# Patient Record
Sex: Female | Born: 1960
Health system: Southern US, Community
[De-identification: ages and names within clinical notes are randomized; demographics above are authoritative.]

## PROBLEM LIST (undated history)

## (undated) DIAGNOSIS — G43909 Migraine, unspecified, not intractable, without status migrainosus: Secondary | ICD-10-CM

## (undated) DIAGNOSIS — Z87891 Personal history of nicotine dependence: Secondary | ICD-10-CM

## (undated) DIAGNOSIS — Z87440 Personal history of urinary (tract) infections: Secondary | ICD-10-CM

## (undated) DIAGNOSIS — R079 Chest pain, unspecified: Secondary | ICD-10-CM

## (undated) DIAGNOSIS — I10 Essential (primary) hypertension: Secondary | ICD-10-CM

## (undated) DIAGNOSIS — E119 Type 2 diabetes mellitus without complications: Secondary | ICD-10-CM

## (undated) DIAGNOSIS — L4050 Arthropathic psoriasis, unspecified: Secondary | ICD-10-CM

## (undated) DIAGNOSIS — G4700 Insomnia, unspecified: Secondary | ICD-10-CM

## (undated) DIAGNOSIS — G43019 Migraine without aura, intractable, without status migrainosus: Secondary | ICD-10-CM

## (undated) DIAGNOSIS — E785 Hyperlipidemia, unspecified: Secondary | ICD-10-CM

## (undated) DIAGNOSIS — Z8673 Personal history of transient ischemic attack (TIA), and cerebral infarction without residual deficits: Secondary | ICD-10-CM

## (undated) DIAGNOSIS — F32A Depression, unspecified: Secondary | ICD-10-CM

## (undated) DIAGNOSIS — G4730 Sleep apnea, unspecified: Secondary | ICD-10-CM

## (undated) DIAGNOSIS — IMO0002 Reserved for concepts with insufficient information to code with codable children: Secondary | ICD-10-CM

## (undated) DIAGNOSIS — I5032 Chronic diastolic (congestive) heart failure: Secondary | ICD-10-CM

## (undated) DIAGNOSIS — Z8249 Family history of ischemic heart disease and other diseases of the circulatory system: Secondary | ICD-10-CM

## (undated) DIAGNOSIS — M7540 Impingement syndrome of unspecified shoulder: Secondary | ICD-10-CM

## (undated) DIAGNOSIS — F419 Anxiety disorder, unspecified: Secondary | ICD-10-CM

## (undated) DIAGNOSIS — K219 Gastro-esophageal reflux disease without esophagitis: Secondary | ICD-10-CM

## (undated) DIAGNOSIS — D649 Anemia, unspecified: Secondary | ICD-10-CM

## (undated) DIAGNOSIS — F329 Major depressive disorder, single episode, unspecified: Secondary | ICD-10-CM

## (undated) DIAGNOSIS — M75110 Incomplete rotator cuff tear or rupture of unspecified shoulder, not specified as traumatic: Secondary | ICD-10-CM

## (undated) DIAGNOSIS — R51 Headache: Secondary | ICD-10-CM

## (undated) DIAGNOSIS — M5030 Other cervical disc degeneration, unspecified cervical region: Secondary | ICD-10-CM

## (undated) DIAGNOSIS — M779 Enthesopathy, unspecified: Secondary | ICD-10-CM

## (undated) HISTORY — DX: Anemia, unspecified: D64.9

## (undated) HISTORY — DX: Enthesopathy, unspecified: M77.9

## (undated) HISTORY — DX: Anxiety disorder, unspecified: F41.9

## (undated) HISTORY — PX: KNEE ARTHROPLASTY: SHX992

## (undated) HISTORY — DX: Gastro-esophageal reflux disease without esophagitis: K21.9

## (undated) HISTORY — DX: Migraine without aura, intractable, without status migrainosus: G43.019

## (undated) HISTORY — DX: Incomplete rotator cuff tear or rupture of unspecified shoulder, not specified as traumatic: M75.110

## (undated) HISTORY — DX: Family history of ischemic heart disease and other diseases of the circulatory system: Z82.49

## (undated) HISTORY — DX: Chronic diastolic (congestive) heart failure: I50.32

## (undated) HISTORY — DX: Personal history of nicotine dependence: Z87.891

## (undated) HISTORY — PX: OTHER SURGICAL HISTORY: SHX169

## (undated) HISTORY — PX: TOTAL SHOULDER ARTHROPLASTY: SHX126

## (undated) HISTORY — DX: Insomnia, unspecified: G47.00

## (undated) HISTORY — DX: Type 2 diabetes mellitus without complications: E11.9

## (undated) HISTORY — DX: Sleep apnea, unspecified: G47.30

## (undated) HISTORY — DX: Hyperlipidemia, unspecified: E78.5

## (undated) HISTORY — DX: Arthropathic psoriasis, unspecified: L40.50

## (undated) HISTORY — PX: DILATION AND CURETTAGE OF UTERUS: SHX78

## (undated) HISTORY — DX: Essential (primary) hypertension: I10

## (undated) HISTORY — DX: Morbid (severe) obesity due to excess calories: E66.01

## (undated) HISTORY — DX: Other cervical disc degeneration, unspecified cervical region: M50.30

## (undated) HISTORY — DX: Depression, unspecified: F32.A

## (undated) HISTORY — DX: Migraine, unspecified, not intractable, without status migrainosus: G43.909

## (undated) HISTORY — PX: BREAST BIOPSY: SHX20

---
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
## 2000-10-06 ENCOUNTER — Other Ambulatory Visit: Admission: RE | Admit: 2000-10-06 | Discharge: 2000-10-06 | Payer: Self-pay | Admitting: Gynecology

## 2000-10-06 ENCOUNTER — Other Ambulatory Visit: Admission: RE | Admit: 2000-10-06 | Discharge: 2000-10-06 | Payer: Self-pay | Admitting: Obstetrics and Gynecology

## 2001-04-23 ENCOUNTER — Encounter: Admission: RE | Admit: 2001-04-23 | Discharge: 2001-04-23 | Payer: Self-pay | Admitting: Family Medicine

## 2001-04-23 ENCOUNTER — Encounter: Payer: Self-pay | Admitting: Family Medicine

## 2002-02-13 ENCOUNTER — Other Ambulatory Visit: Admission: RE | Admit: 2002-02-13 | Discharge: 2002-02-13 | Payer: Self-pay | Admitting: Obstetrics and Gynecology

## 2002-03-28 ENCOUNTER — Inpatient Hospital Stay (HOSPITAL_COMMUNITY): Admission: RE | Admit: 2002-03-28 | Discharge: 2002-03-31 | Payer: Self-pay | Admitting: Obstetrics and Gynecology

## 2002-03-28 ENCOUNTER — Encounter (INDEPENDENT_AMBULATORY_CARE_PROVIDER_SITE_OTHER): Payer: Self-pay | Admitting: *Deleted

## 2002-03-28 HISTORY — PX: UMBILICAL HERNIA REPAIR: SHX196

## 2002-03-28 HISTORY — PX: ABDOMINAL HYSTERECTOMY: SHX81

## 2002-10-22 ENCOUNTER — Encounter: Payer: Self-pay | Admitting: Family Medicine

## 2002-10-22 ENCOUNTER — Encounter: Admission: RE | Admit: 2002-10-22 | Discharge: 2002-10-22 | Payer: Self-pay | Admitting: Family Medicine

## 2002-11-07 ENCOUNTER — Other Ambulatory Visit: Admission: RE | Admit: 2002-11-07 | Discharge: 2002-11-07 | Payer: Self-pay | Admitting: Family Medicine

## 2002-11-13 ENCOUNTER — Encounter: Payer: Self-pay | Admitting: Family Medicine

## 2002-11-13 ENCOUNTER — Encounter: Admission: RE | Admit: 2002-11-13 | Discharge: 2002-11-13 | Payer: Self-pay | Admitting: Family Medicine

## 2003-05-29 ENCOUNTER — Encounter: Admission: RE | Admit: 2003-05-29 | Discharge: 2003-05-29 | Payer: Self-pay | Admitting: Family Medicine

## 2003-05-29 ENCOUNTER — Encounter: Payer: Self-pay | Admitting: Family Medicine

## 2003-11-13 ENCOUNTER — Other Ambulatory Visit: Admission: RE | Admit: 2003-11-13 | Discharge: 2003-11-13 | Payer: Self-pay | Admitting: Family Medicine

## 2004-02-23 ENCOUNTER — Encounter: Admission: RE | Admit: 2004-02-23 | Discharge: 2004-02-23 | Payer: Self-pay | Admitting: Family Medicine

## 2007-04-24 ENCOUNTER — Encounter: Admission: RE | Admit: 2007-04-24 | Discharge: 2007-04-24 | Payer: Self-pay | Admitting: Family Medicine

## 2008-09-19 ENCOUNTER — Ambulatory Visit: Payer: Self-pay | Admitting: Family Medicine

## 2008-09-26 ENCOUNTER — Ambulatory Visit: Payer: Self-pay | Admitting: Family Medicine

## 2008-10-13 ENCOUNTER — Inpatient Hospital Stay (HOSPITAL_COMMUNITY): Admission: RE | Admit: 2008-10-13 | Discharge: 2008-10-15 | Payer: Self-pay | Admitting: Orthopedic Surgery

## 2008-10-13 HISTORY — PX: TOTAL KNEE ARTHROPLASTY: SHX125

## 2008-12-17 ENCOUNTER — Ambulatory Visit: Payer: Self-pay | Admitting: Family Medicine

## 2009-02-03 ENCOUNTER — Ambulatory Visit: Payer: Self-pay | Admitting: Family Medicine

## 2009-03-23 ENCOUNTER — Ambulatory Visit: Payer: Self-pay | Admitting: Family Medicine

## 2009-04-16 ENCOUNTER — Ambulatory Visit: Payer: Self-pay | Admitting: Family Medicine

## 2009-06-18 ENCOUNTER — Ambulatory Visit: Payer: Self-pay | Admitting: Family Medicine

## 2009-09-01 ENCOUNTER — Ambulatory Visit: Payer: Self-pay | Admitting: Family Medicine

## 2009-09-07 ENCOUNTER — Inpatient Hospital Stay (HOSPITAL_COMMUNITY): Admission: RE | Admit: 2009-09-07 | Discharge: 2009-09-10 | Payer: Self-pay | Admitting: Orthopedic Surgery

## 2009-09-07 HISTORY — PX: TOTAL KNEE ARTHROPLASTY: SHX125

## 2009-09-13 ENCOUNTER — Encounter (INDEPENDENT_AMBULATORY_CARE_PROVIDER_SITE_OTHER): Payer: Self-pay | Admitting: Emergency Medicine

## 2009-09-13 ENCOUNTER — Emergency Department (HOSPITAL_COMMUNITY): Admission: EM | Admit: 2009-09-13 | Discharge: 2009-09-13 | Payer: Self-pay | Admitting: Emergency Medicine

## 2009-09-13 ENCOUNTER — Ambulatory Visit: Payer: Self-pay | Admitting: Vascular Surgery

## 2009-10-06 ENCOUNTER — Ambulatory Visit: Payer: Self-pay | Admitting: Family Medicine

## 2009-11-04 ENCOUNTER — Ambulatory Visit: Payer: Self-pay | Admitting: Family Medicine

## 2009-12-21 ENCOUNTER — Ambulatory Visit: Payer: Self-pay | Admitting: Physician Assistant

## 2010-02-18 ENCOUNTER — Ambulatory Visit (HOSPITAL_COMMUNITY): Admission: RE | Admit: 2010-02-18 | Discharge: 2010-02-19 | Payer: Self-pay | Admitting: Neurological Surgery

## 2010-02-18 HISTORY — PX: ANTERIOR CERVICAL DECOMP/DISCECTOMY FUSION: SHX1161

## 2010-03-22 ENCOUNTER — Encounter: Admission: RE | Admit: 2010-03-22 | Discharge: 2010-03-22 | Payer: Self-pay | Admitting: Neurological Surgery

## 2010-04-07 ENCOUNTER — Ambulatory Visit: Payer: Self-pay | Admitting: Physician Assistant

## 2010-04-08 ENCOUNTER — Encounter: Admission: RE | Admit: 2010-04-08 | Discharge: 2010-04-08 | Payer: Self-pay | Admitting: Family Medicine

## 2010-06-16 ENCOUNTER — Ambulatory Visit: Payer: Self-pay | Admitting: Physician Assistant

## 2010-06-22 ENCOUNTER — Ambulatory Visit: Payer: Self-pay | Admitting: Physician Assistant

## 2010-06-28 ENCOUNTER — Encounter: Admission: RE | Admit: 2010-06-28 | Discharge: 2010-06-28 | Payer: Self-pay | Admitting: Neurological Surgery

## 2010-08-04 ENCOUNTER — Ambulatory Visit: Payer: Self-pay | Admitting: Physician Assistant

## 2010-11-28 LAB — CBC
HCT: 27.3 % — ABNORMAL LOW (ref 36.0–46.0)
Hemoglobin: 9.1 g/dL — ABNORMAL LOW (ref 12.0–15.0)
MCHC: 33.4 g/dL (ref 30.0–36.0)
MCV: 83.6 fL (ref 78.0–100.0)
Platelets: 356 10*3/uL (ref 150–400)
RBC: 3.26 MIL/uL — ABNORMAL LOW (ref 3.87–5.11)
RDW: 15.6 % — ABNORMAL HIGH (ref 11.5–15.5)
WBC: 12.1 10*3/uL — ABNORMAL HIGH (ref 4.0–10.5)

## 2010-11-28 LAB — BASIC METABOLIC PANEL
BUN: 8 mg/dL (ref 6–23)
CO2: 27 mEq/L (ref 19–32)
Calcium: 9.1 mg/dL (ref 8.4–10.5)
Chloride: 100 mEq/L (ref 96–112)
Creatinine, Ser: 0.7 mg/dL (ref 0.4–1.2)
GFR calc Af Amer: >60 mL/min (ref >60)
GFR calc non Af Amer: >60 mL/min (ref >60)
Glucose, Bld: 137 mg/dL — ABNORMAL HIGH (ref 70–99)
Potassium: 3.8 mEq/L (ref 3.5–5.1)
Sodium: 135 mEq/L (ref 135–145)

## 2010-11-28 LAB — DIFFERENTIAL
Basophils Absolute: 0.1 10*3/uL (ref 0.0–0.1)
Basophils Relative: 1 % (ref 0–1)
Eosinophils Absolute: 0.2 10*3/uL (ref 0.0–0.7)
Eosinophils Relative: 2 % (ref 0–5)
Lymphocytes Relative: 12 % (ref 12–46)
Lymphs Abs: 1.4 10*3/uL (ref 0.7–4.0)
Monocytes Absolute: 0.8 10*3/uL (ref 0.1–1.0)
Monocytes Relative: 7 % (ref 3–12)
Neutro Abs: 9.5 10*3/uL — ABNORMAL HIGH (ref 1.7–7.7)
Neutrophils Relative %: 79 % — ABNORMAL HIGH (ref 43–77)

## 2010-11-28 LAB — SEDIMENTATION RATE: Sed Rate: 112 mm/hr — ABNORMAL HIGH (ref 0–22)

## 2010-11-28 LAB — PROTIME-INR
INR: 1.29 (ref 0.00–1.49)
Prothrombin Time: 16 seconds — ABNORMAL HIGH (ref 11.6–15.2)

## 2010-11-29 ENCOUNTER — Other Ambulatory Visit: Payer: Self-pay | Admitting: Neurological Surgery

## 2010-11-29 ENCOUNTER — Ambulatory Visit
Admission: RE | Admit: 2010-11-29 | Discharge: 2010-11-29 | Disposition: A | Discharge status: Home / Self Care | Payer: BC Managed Care – PPO | Source: Ambulatory Visit | Attending: Neurological Surgery | Admitting: Neurological Surgery

## 2010-11-29 ENCOUNTER — Ambulatory Visit (INDEPENDENT_AMBULATORY_CARE_PROVIDER_SITE_OTHER): Payer: BC Managed Care – PPO | Admitting: Family Medicine

## 2010-11-29 DIAGNOSIS — M5412 Radiculopathy, cervical region: Secondary | ICD-10-CM

## 2010-11-29 DIAGNOSIS — M5020 Other cervical disc displacement, unspecified cervical region: Secondary | ICD-10-CM

## 2010-11-29 DIAGNOSIS — F430 Acute stress reaction: Secondary | ICD-10-CM

## 2010-11-29 DIAGNOSIS — Z79899 Other long term (current) drug therapy: Secondary | ICD-10-CM

## 2010-11-29 LAB — BASIC METABOLIC PANEL
BUN: 12 mg/dL (ref 6–23)
CO2: 27 mEq/L (ref 19–32)
Calcium: 9.7 mg/dL (ref 8.4–10.5)
Chloride: 100 mEq/L (ref 96–112)
Creatinine, Ser: 0.78 mg/dL (ref 0.4–1.2)
GFR calc Af Amer: >60 mL/min (ref >60)
GFR calc non Af Amer: >60 mL/min (ref >60)
Glucose, Bld: 85 mg/dL (ref 70–99)
Potassium: 4.2 mEq/L (ref 3.5–5.1)
Sodium: 134 mEq/L — ABNORMAL LOW (ref 135–145)

## 2010-11-29 LAB — CBC
HCT: 36.3 % (ref 36.0–46.0)
Hemoglobin: 12.2 g/dL (ref 12.0–15.0)
MCHC: 33.7 g/dL (ref 30.0–36.0)
MCV: 83.1 fL (ref 78.0–100.0)
Platelets: 304 10*3/uL (ref 150–400)
RBC: 4.36 MIL/uL (ref 3.87–5.11)
RDW: 15.8 % — ABNORMAL HIGH (ref 11.5–15.5)
WBC: 10 10*3/uL (ref 4.0–10.5)

## 2010-11-29 LAB — SURGICAL PCR SCREEN
MRSA, PCR: NEGATIVE
Staphylococcus aureus: NEGATIVE

## 2010-11-29 LAB — APTT: aPTT: 28 seconds (ref 24–37)

## 2010-11-29 LAB — PROTIME-INR
INR: 0.96 (ref 0.00–1.49)
Prothrombin Time: 12.7 seconds (ref 11.6–15.2)

## 2010-12-13 LAB — URINE MICROSCOPIC-ADD ON

## 2010-12-13 LAB — CBC
HCT: 26.8 % — ABNORMAL LOW (ref 36.0–46.0)
HCT: 28.2 % — ABNORMAL LOW (ref 36.0–46.0)
HCT: 29.5 % — ABNORMAL LOW (ref 36.0–46.0)
HCT: 37.5 % (ref 36.0–46.0)
Hemoglobin: 12.5 g/dL (ref 12.0–15.0)
Hemoglobin: 8.9 g/dL — ABNORMAL LOW (ref 12.0–15.0)
Hemoglobin: 9.5 g/dL — ABNORMAL LOW (ref 12.0–15.0)
Hemoglobin: 9.9 g/dL — ABNORMAL LOW (ref 12.0–15.0)
MCHC: 33.1 g/dL (ref 30.0–36.0)
MCHC: 33.3 g/dL (ref 30.0–36.0)
MCHC: 33.5 g/dL (ref 30.0–36.0)
MCHC: 33.7 g/dL (ref 30.0–36.0)
MCV: 84 fL (ref 78.0–100.0)
MCV: 84.6 fL (ref 78.0–100.0)
MCV: 84.8 fL (ref 78.0–100.0)
MCV: 84.9 fL (ref 78.0–100.0)
Platelets: 204 10*3/uL (ref 150–400)
Platelets: 208 10*3/uL (ref 150–400)
Platelets: 209 10*3/uL (ref 150–400)
Platelets: 290 10*3/uL (ref 150–400)
RBC: 3.16 MIL/uL — ABNORMAL LOW (ref 3.87–5.11)
RBC: 3.36 MIL/uL — ABNORMAL LOW (ref 3.87–5.11)
RBC: 3.48 MIL/uL — ABNORMAL LOW (ref 3.87–5.11)
RBC: 4.43 MIL/uL (ref 3.87–5.11)
RDW: 15.2 % (ref 11.5–15.5)
RDW: 15.5 % (ref 11.5–15.5)
RDW: 15.6 % — ABNORMAL HIGH (ref 11.5–15.5)
RDW: 15.6 % — ABNORMAL HIGH (ref 11.5–15.5)
WBC: 10.1 10*3/uL (ref 4.0–10.5)
WBC: 12.4 10*3/uL — ABNORMAL HIGH (ref 4.0–10.5)
WBC: 13.9 10*3/uL — ABNORMAL HIGH (ref 4.0–10.5)
WBC: 9.9 10*3/uL (ref 4.0–10.5)

## 2010-12-13 LAB — COMPREHENSIVE METABOLIC PANEL
ALT: 17 U/L (ref 0–35)
AST: 19 U/L (ref 0–37)
Albumin: 3.9 g/dL (ref 3.5–5.2)
Alkaline Phosphatase: 79 U/L (ref 39–117)
BUN: 9 mg/dL (ref 6–23)
CO2: 30 mEq/L (ref 19–32)
Calcium: 9.6 mg/dL (ref 8.4–10.5)
Chloride: 100 mEq/L (ref 96–112)
Creatinine, Ser: 0.75 mg/dL (ref 0.4–1.2)
GFR calc Af Amer: >60 mL/min (ref >60)
GFR calc non Af Amer: >60 mL/min (ref >60)
Glucose, Bld: 84 mg/dL (ref 70–99)
Potassium: 4 mEq/L (ref 3.5–5.1)
Sodium: 138 mEq/L (ref 135–145)
Total Bilirubin: 0.4 mg/dL (ref 0.3–1.2)
Total Protein: 7.2 g/dL (ref 6.0–8.3)

## 2010-12-13 LAB — BASIC METABOLIC PANEL
BUN: 4 mg/dL — ABNORMAL LOW (ref 6–23)
BUN: 5 mg/dL — ABNORMAL LOW (ref 6–23)
BUN: 7 mg/dL (ref 6–23)
CO2: 27 mEq/L (ref 19–32)
CO2: 28 mEq/L (ref 19–32)
CO2: 30 mEq/L (ref 19–32)
Calcium: 8.2 mg/dL — ABNORMAL LOW (ref 8.4–10.5)
Calcium: 8.2 mg/dL — ABNORMAL LOW (ref 8.4–10.5)
Calcium: 8.5 mg/dL (ref 8.4–10.5)
Chloride: 100 mEq/L (ref 96–112)
Chloride: 99 mEq/L (ref 96–112)
Chloride: 99 mEq/L (ref 96–112)
Creatinine, Ser: 0.61 mg/dL (ref 0.4–1.2)
Creatinine, Ser: 0.69 mg/dL (ref 0.4–1.2)
Creatinine, Ser: 0.74 mg/dL (ref 0.4–1.2)
GFR calc Af Amer: >60 mL/min (ref >60)
GFR calc Af Amer: >60 mL/min (ref >60)
GFR calc Af Amer: >60 mL/min (ref >60)
GFR calc non Af Amer: >60 mL/min (ref >60)
GFR calc non Af Amer: >60 mL/min (ref >60)
GFR calc non Af Amer: >60 mL/min (ref >60)
Glucose, Bld: 115 mg/dL — ABNORMAL HIGH (ref 70–99)
Glucose, Bld: 129 mg/dL — ABNORMAL HIGH (ref 70–99)
Glucose, Bld: 97 mg/dL (ref 70–99)
Potassium: 3.4 mEq/L — ABNORMAL LOW (ref 3.5–5.1)
Potassium: 3.8 mEq/L (ref 3.5–5.1)
Potassium: 4 mEq/L (ref 3.5–5.1)
Sodium: 133 mEq/L — ABNORMAL LOW (ref 135–145)
Sodium: 135 mEq/L (ref 135–145)
Sodium: 135 mEq/L (ref 135–145)

## 2010-12-13 LAB — PROTIME-INR
INR: 0.94 (ref 0.00–1.49)
INR: 1.2 (ref 0.00–1.49)
INR: 1.5 — ABNORMAL HIGH (ref 0.00–1.49)
INR: 1.51 — ABNORMAL HIGH (ref 0.00–1.49)
Prothrombin Time: 12.5 seconds (ref 11.6–15.2)
Prothrombin Time: 15.1 seconds (ref 11.6–15.2)
Prothrombin Time: 18 seconds — ABNORMAL HIGH (ref 11.6–15.2)
Prothrombin Time: 18.1 seconds — ABNORMAL HIGH (ref 11.6–15.2)

## 2010-12-13 LAB — URINE CULTURE
Colony Count: 35000
Colony Count: NO GROWTH
Culture: NO GROWTH

## 2010-12-13 LAB — URINALYSIS, MICROSCOPIC ONLY
Bilirubin Urine: NEGATIVE
Glucose, UA: NEGATIVE mg/dL
Hgb urine dipstick: NEGATIVE
Ketones, ur: NEGATIVE mg/dL
Leukocytes, UA: NEGATIVE
Nitrite: NEGATIVE
Protein, ur: NEGATIVE mg/dL
Specific Gravity, Urine: 1.01 (ref 1.005–1.030)
Urobilinogen, UA: 0.2 mg/dL (ref 0.0–1.0)
pH: 6 (ref 5.0–8.0)

## 2010-12-13 LAB — DIFFERENTIAL
Basophils Absolute: 0.1 10*3/uL (ref 0.0–0.1)
Basophils Relative: 1 % (ref 0–1)
Eosinophils Absolute: 0.3 10*3/uL (ref 0.0–0.7)
Eosinophils Relative: 3 % (ref 0–5)
Lymphocytes Relative: 18 % (ref 12–46)
Lymphs Abs: 1.8 10*3/uL (ref 0.7–4.0)
Monocytes Absolute: 0.6 10*3/uL (ref 0.1–1.0)
Monocytes Relative: 6 % (ref 3–12)
Neutro Abs: 7.2 10*3/uL (ref 1.7–7.7)
Neutrophils Relative %: 73 % (ref 43–77)

## 2010-12-13 LAB — URINALYSIS, ROUTINE W REFLEX MICROSCOPIC
Bilirubin Urine: NEGATIVE
Glucose, UA: NEGATIVE mg/dL
Hgb urine dipstick: NEGATIVE
Ketones, ur: NEGATIVE mg/dL
Nitrite: NEGATIVE
Protein, ur: NEGATIVE mg/dL
Specific Gravity, Urine: 1.017 (ref 1.005–1.030)
Urobilinogen, UA: 0.2 mg/dL (ref 0.0–1.0)
pH: 5.5 (ref 5.0–8.0)

## 2010-12-13 LAB — APTT: aPTT: 29 seconds (ref 24–37)

## 2010-12-13 LAB — TYPE AND SCREEN
ABO/RH(D): B POS
Antibody Screen: NEGATIVE

## 2010-12-14 ENCOUNTER — Ambulatory Visit (INDEPENDENT_AMBULATORY_CARE_PROVIDER_SITE_OTHER): Payer: BC Managed Care – PPO | Admitting: Family Medicine

## 2010-12-14 DIAGNOSIS — R55 Syncope and collapse: Secondary | ICD-10-CM

## 2010-12-27 LAB — DIFFERENTIAL
Basophils Absolute: 0 10*3/uL (ref 0.0–0.1)
Basophils Relative: 0 % (ref 0–1)
Eosinophils Absolute: 0.2 10*3/uL (ref 0.0–0.7)
Eosinophils Relative: 2 % (ref 0–5)
Lymphocytes Relative: 21 % (ref 12–46)
Lymphs Abs: 2.2 10*3/uL (ref 0.7–4.0)
Monocytes Absolute: 0.8 10*3/uL (ref 0.1–1.0)
Monocytes Relative: 8 % (ref 3–12)
Neutro Abs: 7 10*3/uL (ref 1.7–7.7)
Neutrophils Relative %: 69 % (ref 43–77)

## 2010-12-27 LAB — CBC
HCT: 39.9 % (ref 36.0–46.0)
Hemoglobin: 13.1 g/dL (ref 12.0–15.0)
MCHC: 32.8 g/dL (ref 30.0–36.0)
MCV: 84.2 fL (ref 78.0–100.0)
Platelets: 317 10*3/uL (ref 150–400)
RBC: 4.74 MIL/uL (ref 3.87–5.11)
RDW: 14.8 % (ref 11.5–15.5)
WBC: 10.2 10*3/uL (ref 4.0–10.5)

## 2010-12-27 LAB — COMPREHENSIVE METABOLIC PANEL
ALT: 18 U/L (ref 0–35)
AST: 18 U/L (ref 0–37)
Albumin: 3.7 g/dL (ref 3.5–5.2)
Alkaline Phosphatase: 75 U/L (ref 39–117)
BUN: 9 mg/dL (ref 6–23)
CO2: 29 mEq/L (ref 19–32)
Calcium: 10.5 mg/dL (ref 8.4–10.5)
Chloride: 99 mEq/L (ref 96–112)
Creatinine, Ser: 0.77 mg/dL (ref 0.4–1.2)
GFR calc Af Amer: >60 mL/min (ref >60)
GFR calc non Af Amer: >60 mL/min (ref >60)
Glucose, Bld: 98 mg/dL (ref 70–99)
Potassium: 4 mEq/L (ref 3.5–5.1)
Sodium: 139 mEq/L (ref 135–145)
Total Bilirubin: 0.4 mg/dL (ref 0.3–1.2)
Total Protein: 7 g/dL (ref 6.0–8.3)

## 2010-12-27 LAB — URINE CULTURE: Colony Count: >=100000

## 2010-12-27 LAB — PROTIME-INR
INR: 0.9 (ref 0.00–1.49)
Prothrombin Time: 12.6 seconds (ref 11.6–15.2)

## 2010-12-27 LAB — URINALYSIS, ROUTINE W REFLEX MICROSCOPIC
Bilirubin Urine: NEGATIVE
Glucose, UA: NEGATIVE mg/dL
Hgb urine dipstick: NEGATIVE
Ketones, ur: NEGATIVE mg/dL
Nitrite: NEGATIVE
Protein, ur: NEGATIVE mg/dL
Specific Gravity, Urine: 1.015 (ref 1.005–1.030)
Urobilinogen, UA: 0.2 mg/dL (ref 0.0–1.0)
pH: 6.5 (ref 5.0–8.0)

## 2010-12-27 LAB — URINE MICROSCOPIC-ADD ON

## 2010-12-27 LAB — APTT: aPTT: 32 seconds (ref 24–37)

## 2010-12-28 LAB — CBC
HCT: 27.9 % — ABNORMAL LOW (ref 36.0–46.0)
HCT: 29.7 % — ABNORMAL LOW (ref 36.0–46.0)
Hemoglobin: 9.2 g/dL — ABNORMAL LOW (ref 12.0–15.0)
Hemoglobin: 9.8 g/dL — ABNORMAL LOW (ref 12.0–15.0)
MCHC: 33 g/dL (ref 30.0–36.0)
MCHC: 33.1 g/dL (ref 30.0–36.0)
MCV: 83.7 fL (ref 78.0–100.0)
MCV: 84 fL (ref 78.0–100.0)
Platelets: 202 10*3/uL (ref 150–400)
Platelets: 212 10*3/uL (ref 150–400)
RBC: 3.32 MIL/uL — ABNORMAL LOW (ref 3.87–5.11)
RBC: 3.55 MIL/uL — ABNORMAL LOW (ref 3.87–5.11)
RDW: 14.6 % (ref 11.5–15.5)
RDW: 14.8 % (ref 11.5–15.5)
WBC: 10.8 10*3/uL — ABNORMAL HIGH (ref 4.0–10.5)
WBC: 12.7 10*3/uL — ABNORMAL HIGH (ref 4.0–10.5)

## 2010-12-28 LAB — BASIC METABOLIC PANEL
BUN: 6 mg/dL (ref 6–23)
BUN: 8 mg/dL (ref 6–23)
CO2: 29 mEq/L (ref 19–32)
CO2: 31 mEq/L (ref 19–32)
Calcium: 7.9 mg/dL — ABNORMAL LOW (ref 8.4–10.5)
Calcium: 8.2 mg/dL — ABNORMAL LOW (ref 8.4–10.5)
Chloride: 97 mEq/L (ref 96–112)
Chloride: 99 mEq/L (ref 96–112)
Creatinine, Ser: 0.68 mg/dL (ref 0.4–1.2)
Creatinine, Ser: 0.79 mg/dL (ref 0.4–1.2)
GFR calc Af Amer: >60 mL/min (ref >60)
GFR calc Af Amer: >60 mL/min (ref >60)
GFR calc non Af Amer: >60 mL/min (ref >60)
GFR calc non Af Amer: >60 mL/min (ref >60)
Glucose, Bld: 100 mg/dL — ABNORMAL HIGH (ref 70–99)
Glucose, Bld: 106 mg/dL — ABNORMAL HIGH (ref 70–99)
Potassium: 2.9 mEq/L — ABNORMAL LOW (ref 3.5–5.1)
Potassium: 3.3 mEq/L — ABNORMAL LOW (ref 3.5–5.1)
Sodium: 134 mEq/L — ABNORMAL LOW (ref 135–145)
Sodium: 135 mEq/L (ref 135–145)

## 2010-12-28 LAB — URINALYSIS, ROUTINE W REFLEX MICROSCOPIC
Bilirubin Urine: NEGATIVE
Glucose, UA: NEGATIVE mg/dL
Hgb urine dipstick: NEGATIVE
Ketones, ur: NEGATIVE mg/dL
Nitrite: NEGATIVE
Protein, ur: NEGATIVE mg/dL
Specific Gravity, Urine: 1.021 (ref 1.005–1.030)
Urobilinogen, UA: 0.2 mg/dL (ref 0.0–1.0)
pH: 6 (ref 5.0–8.0)

## 2010-12-28 LAB — PROTIME-INR
INR: 1.1 (ref 0.00–1.49)
INR: 1.4 (ref 0.00–1.49)
Prothrombin Time: 14.4 seconds (ref 11.6–15.2)
Prothrombin Time: 17.4 seconds — ABNORMAL HIGH (ref 11.6–15.2)

## 2010-12-28 LAB — TYPE AND SCREEN
ABO/RH(D): B POS
Antibody Screen: NEGATIVE

## 2010-12-28 LAB — ABO/RH: ABO/RH(D): B POS

## 2011-01-19 ENCOUNTER — Ambulatory Visit: Payer: BC Managed Care – PPO | Attending: Diagnostic Neuroimaging | Admitting: Physical Therapy

## 2011-01-19 DIAGNOSIS — R269 Unspecified abnormalities of gait and mobility: Secondary | ICD-10-CM | POA: Insufficient documentation

## 2011-01-19 DIAGNOSIS — IMO0001 Reserved for inherently not codable concepts without codable children: Secondary | ICD-10-CM | POA: Insufficient documentation

## 2011-01-19 DIAGNOSIS — M6281 Muscle weakness (generalized): Secondary | ICD-10-CM | POA: Insufficient documentation

## 2011-01-25 NOTE — Op Note (Signed)
[name], [name]             ACCOUNT NO.:  [id number]   MEDICAL RECORD NO.:  [id number]          PATIENT TYPE:  INP   LOCATION:  5008                         FACILITY:  [facility]   PHYSICIAN:  Elana Alm. Thurston Hole, [name] DATE OF BIRTH:  1961-09-06   DATE OF PROCEDURE:  10/13/2008  DATE OF DISCHARGE:                               OPERATIVE REPORT   PREOPERATIVE DIAGNOSIS:  Right knee degenerative joint disease.   POSTOPERATIVE DIAGNOSIS:  Right knee degenerative joint disease.   PROCEDURE:  Right total knee replacement using DePuy cemented total knee  system with #3 cemented femur, #3 cemented tibia, with 15-mm  polyethylene RP tibial spacer, and 32-mm polyethylene cemented patella.   SURGEON:  Elana Alm. Thurston Hole, MD   ASSISTANT:  Julien Girt, PA   ANESTHESIA:  General.   OPERATIVE TIME:  1 hour and 30 minutes.   COMPLICATIONS:  None.   DESCRIPTION OF PROCEDURE:  [name] was brought to the operating room  on October 13, 2008, after a femoral nerve block was placed in the  holding area by Anesthesia.  She was placed on the operative table in  supine position.  She received Ancef 2 g IV preoperatively for  prophylaxis.  Her right knee was examined under anesthesia.  Range of  motion from -5 to 120 degrees.  Moderate varus deformity, knee stable  ligamentous exam with normal patellar tracking.  The right leg was  prepped using sterile DuraPrep and draped using sterile technique.  She  had had a Foley catheter placed under sterile conditions.  Right leg was  then exsanguinated and a thigh tourniquet elevated to a 375 mm.  Initially through a 15-cm longitudinal incision based over the patella,  initial exposure was made.  The underlying subcutaneous tissues were  incised along with skin incision.  A median arthrotomy was performed  revealing excessive amount of normal-appearing joint fluid.  The  articular surfaces were inspected.  She had grade 4 changes medially,  grade 3  and 4 changes laterally, and grade 3 and 4 changes in her  patellofemoral joint.  Osteophytes removed from the femoral condyles and  tibial plateau.  Medial and lateral meniscal remnants were removed as  well as the anterior cruciate ligament.  An intramedullary drill was  then drilled up the femoral canal for placement of distal femoral  cutting jig, which was placed in the appropriate manner rotation and  distal 11-mm cut was then made.  Proximal femur was incised.  A #3 was  found to be the appropriate size.  A #3 cutting jig was placed in the  appropriate manner rotation and then these cuts were made.  The proximal  tibia was then exposed.  Tibial spines were removed with an oscillating  saw.  Intramedullary drill was drilled down the tibial canal for  placement of proximal tibial cutting jig, which was placed in the  appropriate manner rotation and a proximal 6 mm cut was made based off  the medial or lower side.  Spacer blocks were then placed in flexion and  extension.  A 15-mm block gave excellent balancing, excellent stability,  and excellent correction of her flexion and varus deformities.  At this  point, the proximal tibial surface was exposed.  A #3 tibial baseplate  trial was placed with an excellent fit and then the keel cut was made.  The PCL box cutter was then placed on the distal femur and these cuts  were made.  At this point, #3 femoral trial was placed and with the #3  tibial baseplate trial and a 15-mm polyethylene RP tibial spacer, knee  was reduced, taken through a range of motion from 0 to 125 degrees with  excellent stability and excellent correction of her flexion and varus  deformities and normal patellar tracking.  A resurfacing 8-mm cut was  then made on the patella and 3 locking holes placed for a 32-mm patellar  trial, which was then placed and again patellofemoral tracking was  evaluated and found to be normal.  At this point, it was felt that all  the  trial components were of excellent size, fit, and stability.  They  were then removed, and the knee was jet lavaged, irrigated with 3 liters  of saline.  The proximal tibia was then exposed.  A #3 tibial baseplate  with cement backing was hammered into position with an excellent fit  with excess cement being removed from around the edges.  A #3 femoral  component with cement backing was hammered in position also with an  excellent fit with excess cement being removed from around the edges.  The 15-mm polyethylene RP tibial spacer was placed on the tibial  baseplate, knee reduced, taken through a range of motion from 0 to 125  degrees with excellent stability and excellent correction of her flexion  and varus deformities.  The 32-mm polyethylene cement-backed patella was  then placed in this position and held there with a clamp.  After the  cement hardened, again patellofemoral tracking was evaluated and found  to be normal.  At this point, it was felt that all the components were  of excellent size, fit, and stability.  The wound was further irrigated  with saline.  The tourniquet was released, hemostasis obtained with  cautery.  The arthrotomy was then closed with #1 Ethilon suture over 2  medium Hemovac drains.  Subcutaneous tissues were closed with 0 and 2-0  Vicryl, subcuticular layer closed with 4-0 Monocryl, sterile dressings  and long-leg splint applied.  The patient was then awakened, extubated,  and taken to recovery room in stable condition.  Needle and sponge  counts were correct x2 at the end of the case.  Neurovascular status  normal, pulses 2+, and symmetric.      Robert A. Thurston Hole, [name]  Electronically Signed     RAW/MEDQ  D:  10/13/2008  T:  10/14/2008  Job:  [id number]

## 2011-01-26 ENCOUNTER — Ambulatory Visit: Payer: BC Managed Care – PPO | Admitting: Physical Therapy

## 2011-01-27 ENCOUNTER — Ambulatory Visit: Payer: BC Managed Care – PPO | Admitting: Physical Therapy

## 2011-01-28 NOTE — Discharge Summary (Signed)
   [name], [name] NO.:  [id number]   MEDICAL RECORD NO.:  [id number]                   PATIENT TYPE:   LOCATION:                                       FACILITY:   PHYSICIAN:  Miguel Aschoff, [name]                    DATE OF BIRTH:   DATE OF ADMISSION:  03/28/2002  DATE OF DISCHARGE:  03/31/2002                                 DISCHARGE SUMMARY   ADMISSION DIAGNOSES:  Irregular vaginal bleeding unresponsive to medical  therapy, periumbilical mass.   BRIEF HISTORY:  The patient is a 50 year old black female who has history of  persistent irregular vaginal bleeding that has been treated medically with  separate progestational therapy.  In addition, the patient also had a prior  D&C with benign pathology.  Because of continued irregular vaginal bleeding  she requested a definitive procedure be carried out to arrest this bleeding.  In addition, she was noted to have a mass in the umbilical region and wanted  this evaluated at the time of surgery.  Informed consents were obtained and  the patient was admitted to the hospital to undergo abdominal hysterectomy.   HOSPITAL COURSE:  Preoperative laboratories were obtained.  This included an  admission hemoglobin of 11.5, white count 9800.  PT and PTT were within  normal limits.  Chemistry profile was also within normal limits.  The  urinalysis was negative.  Under general anesthesia on March 28, 2002 a total  abdominal hysterectomy was carried out without difficulty.  After the  hysterectomy the mass at the umbilical region was evaluated and proved to be  an umbilical hernia and this was repaired without problems at the time of  closure of her abdominal incision.  The patient's postoperative course was  unremarkable.  She tolerated increasing ambulation and diet well and by July  20 was in satisfactory condition and could be discharged home.  Medications  for home included Tylox one q.3h. p.r.n. for pain,  Chromagen one daily,  Cipro 200 mg b.i.d., Peri-Colace as needed for BM.  She was sent home on a  regular diet.  Instructed to do no heavy lifting and place nothing in the  vagina.  She was to be seen back in four weeks for follow-up examination.  The pathology report on the hysterectomy specimen revealed the uterus  weighing 77 g with cervix showing squamous metaplasia, weakly proliferative  endometrium, leiomyomata in the myometrium.  The serosal surfaces were  unremarkable.                                               Miguel Aschoff, [name]    AR/MEDQ  D:  05/09/2002  T:  05/09/2002  Job:  [id number]

## 2011-01-28 NOTE — Op Note (Signed)
[facility]  Patient:    [name], [name] Visit Number: [id number] MRN: [id number]          Service Type: GYN Location: 9300 [id number] Attending Physician:  Miguel Aschoff Dictated by:   Miguel Aschoff, [name] Proc. Date: 03/28/02 Admit Date:  03/28/2002 Discharge Date: 03/31/2002                             Operative Report  PREOPERATIVE DIAGNOSES:       1. Menometrorrhagia.                               2. Periumbilical mass.  POSTOPERATIVE DIAGNOSES:      1. Menometrorrhagia.                               2. Periumbilical mass with small umbilical                                  hernia.  OPERATIONS:                   1. Total abdominal hysterectomy.                               2. Repair of umbilical hernia.  SURGEON:                      Miguel Aschoff, [name]  ANESTHESIA:                   General.  COMPLICATIONS:                None.  INDICATIONS:                  The patient is a 50 year old black female with a history of persistent irregular vaginal bleeding.  Multiple attempts were made to control the patients bleeding using medical therapy with cyclic Provera 10 days each month.  In addition, the patient has previously undergone a D&C for persistent irregular vaginal bleeding in 1995, which revealed an endometrial polyp and degenerating proliferative endometrium.  Because of the patients persistent bleeding and desire to undergo a definitive procedure to resolve this problem, she is being taken to the operating room at this time to undergo total abdominal hysterectomy.  In addition, she reports a mass in her periumbilical region, which she would like to have evaluated and treated if possible.  Informed consent has been obtained.  DESCRIPTION OF PROCEDURE:     The patient was taken to the operating room and placed in a supine position, and general anesthesia was administered without difficulty.  After this was done she was prepped and draped  in the usual sterile fashion.  A Foley catheter was inserted.  A midline incision was then made from the symphysis pubis to the umbilicus.  This was extended down through the subcutaneous tissue until the fascia was identified.  All bleeding points were clamped and coagulated as they were encountered.  The fascia was then incised vertically.  The peritoneum was then revealed and entered, carefully avoiding any underlying structures.  The peritoneal incision was then extended under direct visualization.  Once this was  done, a self-retaining retractor was placed through the wound.  The viscera were packed out of the pelvis.  The uterus appeared to be normal size.  The ovaries appeared to be within normal limits except for a small benign-appearing cyst on the left ovary.  The cul-de-sac was unremarkable.  The round ligaments were unremarkable.  The intestinal surfaces appeared to be within normal limits. The only other remarkable finding was a small umbilical hernia, and this was felt to represent the mass that the patient was describing around the umbilicus.  At this point the round ligaments were identified and clamped, cut, and suture ligated using suture ligatures of 0 Vicryl.  A bladder flap was then created anteriorly and taken down sharply.  Preparations were then made below the utero-ovarian ligaments.  These were then clamped using curved Heaney clamps, pedicles were cut and suture ligated using two ligatures of 0 Vicryl.  Then the broad ligament was skeletonized, the uterine vessels were identified, clamped with curved Heaney clamps.  The pedicles were cut and suture ligated using suture ligatures of 0 Vicryl.  Then in serial fashion the paracervical fascia, uterosacral ligaments, and cardinal ligaments were clamped, cut, and suture ligated using Masterson clamps, and all sutures were 0 Vicryl.  At this point it was possible to enter the vaginal vault, and the cervix was cut free  from the vaginal fornices.  The angles of the vaginal cuff were then identified and ligated using figure-of-eight sutures of 0 Vicryl with sutures placed through the uterosacral ligaments for support.  The cuff was then closed using running locking 0 Vicryl suture.  The pelvic floor was then reperitonealized using running continuous 2-0 Vicryl.  Inspection was made for hemostasis, and hemostasis appeared to be excellent.  At this point attention was directed to the umbilical hernia.  The hernia defect was identified and the fascia repaired using three interrupted sutures of 3-0 Vicryl.  At this point lap counts and instrument counts were taken and found to be correct, then the abdomen was closed.  The parietal peritoneum was closed using running continuous 0 Vicryl suture, the fascia was closed using interrupted 0 Vicryl sutures, sutures were placed at approximately 1 cm intervals.  The subcutaneous tissue was closed using interrupted 0 Vicryl sutures and the skin incision was closed using staples.  The estimated blood loss was approximately 150 cc.  The patient tolerated the procedure well and went to the recovery room in satisfactory condition. Dictated by:   Miguel Aschoff, [name] Attending Physician:  Miguel Aschoff DD:  03/28/02 TD:  04/01/02 Job: [id number]

## 2011-02-01 ENCOUNTER — Ambulatory Visit: Payer: BC Managed Care – PPO | Admitting: Physical Therapy

## 2011-02-04 ENCOUNTER — Ambulatory Visit: Payer: BC Managed Care – PPO | Admitting: Physical Therapy

## 2011-02-09 ENCOUNTER — Ambulatory Visit: Payer: BC Managed Care – PPO | Admitting: Physical Therapy

## 2011-02-11 ENCOUNTER — Ambulatory Visit: Payer: BC Managed Care – PPO | Attending: Diagnostic Neuroimaging | Admitting: Physical Therapy

## 2011-02-11 DIAGNOSIS — IMO0001 Reserved for inherently not codable concepts without codable children: Secondary | ICD-10-CM | POA: Insufficient documentation

## 2011-02-11 DIAGNOSIS — R269 Unspecified abnormalities of gait and mobility: Secondary | ICD-10-CM | POA: Insufficient documentation

## 2011-02-11 DIAGNOSIS — M6281 Muscle weakness (generalized): Secondary | ICD-10-CM | POA: Insufficient documentation

## 2011-02-14 ENCOUNTER — Ambulatory Visit: Payer: BC Managed Care – PPO | Admitting: Physical Therapy

## 2011-02-15 ENCOUNTER — Encounter: Payer: BC Managed Care – PPO | Admitting: Physical Therapy

## 2011-02-16 ENCOUNTER — Ambulatory Visit: Payer: BC Managed Care – PPO | Admitting: Physical Therapy

## 2011-02-28 ENCOUNTER — Other Ambulatory Visit: Payer: Self-pay | Admitting: Neurological Surgery

## 2011-02-28 DIAGNOSIS — M542 Cervicalgia: Secondary | ICD-10-CM

## 2011-03-07 ENCOUNTER — Ambulatory Visit
Admission: RE | Admit: 2011-03-07 | Discharge: 2011-03-07 | Disposition: A | Discharge status: Home / Self Care | Payer: BC Managed Care – PPO | Source: Ambulatory Visit | Attending: Neurological Surgery | Admitting: Neurological Surgery

## 2011-03-07 DIAGNOSIS — M542 Cervicalgia: Secondary | ICD-10-CM

## 2011-04-07 ENCOUNTER — Other Ambulatory Visit: Payer: Self-pay | Admitting: Family Medicine

## 2011-04-08 ENCOUNTER — Other Ambulatory Visit: Payer: Self-pay | Admitting: Family Medicine

## 2011-04-18 ENCOUNTER — Other Ambulatory Visit: Payer: Self-pay | Admitting: Internal Medicine

## 2011-04-18 DIAGNOSIS — Z1231 Encounter for screening mammogram for malignant neoplasm of breast: Secondary | ICD-10-CM

## 2011-04-27 ENCOUNTER — Ambulatory Visit
Admission: RE | Admit: 2011-04-27 | Discharge: 2011-04-27 | Disposition: A | Discharge status: Home / Self Care | Payer: BC Managed Care – PPO | Source: Ambulatory Visit | Attending: Internal Medicine | Admitting: Internal Medicine

## 2011-04-27 DIAGNOSIS — Z1231 Encounter for screening mammogram for malignant neoplasm of breast: Secondary | ICD-10-CM

## 2011-05-09 ENCOUNTER — Other Ambulatory Visit: Payer: Self-pay | Admitting: Family Medicine

## 2011-08-15 ENCOUNTER — Ambulatory Visit
Admission: RE | Admit: 2011-08-15 | Discharge: 2011-08-15 | Disposition: A | Discharge status: Home / Self Care | Payer: BC Managed Care – PPO | Source: Ambulatory Visit | Attending: Allergy and Immunology | Admitting: Allergy and Immunology

## 2011-08-15 ENCOUNTER — Other Ambulatory Visit: Payer: Self-pay | Admitting: Allergy and Immunology

## 2011-08-15 DIAGNOSIS — R062 Wheezing: Secondary | ICD-10-CM

## 2011-08-23 ENCOUNTER — Encounter: Payer: Self-pay | Admitting: Gastroenterology

## 2011-09-13 HISTORY — PX: ROTATOR CUFF REPAIR: SHX139

## 2011-09-20 ENCOUNTER — Encounter: Payer: Self-pay | Admitting: Gastroenterology

## 2011-09-20 ENCOUNTER — Ambulatory Visit (AMBULATORY_SURGERY_CENTER): Payer: 59

## 2011-09-20 VITALS — Ht 62.0 in | Wt 300.1 lb

## 2011-09-20 DIAGNOSIS — Z1211 Encounter for screening for malignant neoplasm of colon: Secondary | ICD-10-CM

## 2011-09-20 MED ORDER — PEG-KCL-NACL-NASULF-NA ASC-C 100 G PO SOLR: 1.0000 | Freq: Once | ORAL | Status: AC

## 2011-09-30 ENCOUNTER — Telehealth: Payer: Self-pay | Admitting: Family Medicine

## 2011-09-30 NOTE — Telephone Encounter (Signed)
DONE

## 2011-10-03 ENCOUNTER — Other Ambulatory Visit: Payer: Self-pay | Admitting: Family Medicine

## 2011-10-04 ENCOUNTER — Ambulatory Visit (AMBULATORY_SURGERY_CENTER): Payer: 59 | Admitting: Gastroenterology

## 2011-10-04 ENCOUNTER — Encounter: Payer: Self-pay | Admitting: Gastroenterology

## 2011-10-04 VITALS — BP 131/71 | HR 89 | Temp 97.8°F | Resp 16 | Ht 62.0 in | Wt 300.0 lb

## 2011-10-04 DIAGNOSIS — D126 Benign neoplasm of colon, unspecified: Secondary | ICD-10-CM

## 2011-10-04 DIAGNOSIS — Z1211 Encounter for screening for malignant neoplasm of colon: Secondary | ICD-10-CM

## 2011-10-04 MED ORDER — SODIUM CHLORIDE 0.9 % IV SOLN: 500.0000 mL | INTRAVENOUS | Status: DC

## 2011-10-04 NOTE — Progress Notes (Signed)
Patient did not experience any of the following events: a burn prior to discharge; a fall within the facility; wrong site/side/patient/procedure/implant event; or a hospital transfer or hospital admission upon discharge from the facility. (G8907) Patient did not have preoperative order for IV antibiotic SSI prophylaxis. (G8918)  

## 2011-10-04 NOTE — Patient Instructions (Signed)
Discharge instructions given with verbal understanding. Handout on polyps given. Resume previous medications. 

## 2011-10-04 NOTE — Op Note (Signed)
[facility] [address] [place], [place]  [address]  COLONOSCOPY PROCEDURE REPORT  PATIENT:  Bonnie, Mathis  MR#:  [id number] BIRTHDATE:  25-Jun-1961, 50 yrs. old  GENDER:  female ENDOSCOPIST:  Rachael Fee, MD REF. BY:  Creola Corn, [name] PROCEDURE DATE:  10/04/2011 PROCEDURE:  Colonoscopy with snare polypectomy ASA CLASS:  Class II INDICATIONS:  Routine Risk Screening MEDICATIONS:   Fentanyl 75 mcg IV, These medications were titrated to patient response per physician's verbal order, Versed 7 mg IV  DESCRIPTION OF PROCEDURE:   After the risks benefits and alternatives of the procedure were thoroughly explained, informed consent was obtained.  Digital rectal exam was performed and revealed no rectal masses.   The LB [id number] endoscope was introduced through the anus and advanced to the cecum, which was identified by both the appendix and ileocecal valve, without limitations.  The quality of the prep was good..  The instrument was then slowly withdrawn as the colon was fully examined. <<PROCEDUREIMAGES>> FINDINGS:  A diminutive polyp was found in the sigmoid colon. This was removed with cold snare and sent to pathology (jar 1) (see image3).  This was otherwise a normal examination of the colon (see image4, image2, and image1).   Retroflexed views in the rectum revealed no abnormalities. COMPLICATIONS:  None  ENDOSCOPIC IMPRESSION: 1) Diminutive polyp in the sigmoid colon, this was removed and sent to pathology 2) Otherwise normal examination  RECOMMENDATIONS: 1) If the polyp(s) removed today are proven to be adenomatous (pre-cancerous) polyps, you will need a repeat colonoscopy in 5 years. Otherwise you should continue to follow colorectal cancer screening guidelines for "routine risk" patients with colonoscopy in 10 years. 2) You will receive a letter within 1-2 weeks with the results of your biopsy as well as final recommendations. Please call  my office if you have not received a letter after 3 weeks.  ______________________________ Rachael Fee, MD  n. eSIGNED:   Rachael Fee at 10/04/2011 10:25 AM  Charlynn Grimes, [phone]

## 2011-10-05 ENCOUNTER — Telehealth: Payer: Self-pay | Admitting: *Deleted

## 2011-10-05 NOTE — Telephone Encounter (Signed)

## 2011-10-11 ENCOUNTER — Encounter: Payer: Self-pay | Admitting: Gastroenterology

## 2011-12-27 ENCOUNTER — Other Ambulatory Visit: Payer: Self-pay | Admitting: *Deleted

## 2011-12-27 ENCOUNTER — Encounter (INDEPENDENT_AMBULATORY_CARE_PROVIDER_SITE_OTHER): Payer: 59

## 2011-12-27 DIAGNOSIS — M79609 Pain in unspecified limb: Secondary | ICD-10-CM

## 2011-12-27 DIAGNOSIS — M7989 Other specified soft tissue disorders: Secondary | ICD-10-CM

## 2011-12-31 ENCOUNTER — Emergency Department (HOSPITAL_COMMUNITY)
Admission: EM | Admit: 2011-12-31 | Discharge: 2011-12-31 | Disposition: A | Discharge status: Home / Self Care | Payer: 59 | Attending: Emergency Medicine | Admitting: Emergency Medicine

## 2011-12-31 ENCOUNTER — Encounter (HOSPITAL_COMMUNITY): Payer: Self-pay | Admitting: Emergency Medicine

## 2011-12-31 DIAGNOSIS — K219 Gastro-esophageal reflux disease without esophagitis: Secondary | ICD-10-CM | POA: Insufficient documentation

## 2011-12-31 DIAGNOSIS — M7989 Other specified soft tissue disorders: Secondary | ICD-10-CM

## 2011-12-31 DIAGNOSIS — F411 Generalized anxiety disorder: Secondary | ICD-10-CM | POA: Insufficient documentation

## 2011-12-31 DIAGNOSIS — I10 Essential (primary) hypertension: Secondary | ICD-10-CM | POA: Insufficient documentation

## 2011-12-31 DIAGNOSIS — Z8673 Personal history of transient ischemic attack (TIA), and cerebral infarction without residual deficits: Secondary | ICD-10-CM | POA: Insufficient documentation

## 2011-12-31 DIAGNOSIS — R000 Tachycardia, unspecified: Secondary | ICD-10-CM | POA: Insufficient documentation

## 2011-12-31 DIAGNOSIS — J45909 Unspecified asthma, uncomplicated: Secondary | ICD-10-CM | POA: Insufficient documentation

## 2011-12-31 DIAGNOSIS — R609 Edema, unspecified: Secondary | ICD-10-CM | POA: Insufficient documentation

## 2011-12-31 LAB — POCT I-STAT, CHEM 8
BUN: 10 mg/dL (ref 6–23)
Calcium, Ion: 1.17 mmol/L (ref 1.12–1.32)
Chloride: 99 mEq/L (ref 96–112)
Creatinine, Ser: 0.9 mg/dL (ref 0.50–1.10)
Glucose, Bld: 114 mg/dL — ABNORMAL HIGH (ref 70–99)
HCT: 39 % (ref 36.0–46.0)
Hemoglobin: 13.3 g/dL (ref 12.0–15.0)
Potassium: 3.2 mEq/L — ABNORMAL LOW (ref 3.5–5.1)
Sodium: 140 mEq/L (ref 135–145)
TCO2: 30 mmol/L (ref 0–100)

## 2011-12-31 NOTE — Discharge Instructions (Signed)
Peripheral Edema You have swelling in your legs (peripheral edema). This swelling is due to excess accumulation of salt and water in your body. Edema may be a sign of heart, kidney or liver disease, or a side effect of a medication. It may also be due to problems in the leg veins. Elevating your legs and using special support stockings may be very helpful, if the cause of the swelling is due to poor venous circulation. Avoid long periods of standing, whatever the cause. Treatment of edema depends on identifying the cause. Chips, pretzels, pickles and other salty foods should be avoided. Restricting salt in your diet is almost always needed. Water pills (diuretics) are often used to remove the excess salt and water from your body via urine. These medicines prevent the kidney from reabsorbing sodium. This increases urine flow. Diuretic treatment may also result in lowering of potassium levels in your body. Potassium supplements may be needed if you have to use diuretics daily. Daily weights can help you keep track of your progress in clearing your edema. You should call your caregiver for follow up care as recommended. SEEK IMMEDIATE MEDICAL CARE IF:   You have increased swelling, pain, redness, or heat in your legs.   You develop shortness of breath, especially when lying down.   You develop chest or abdominal pain, weakness, or fainting.   You have a fever.  Document Released: 10/06/2004 Document Revised: 08/18/2011 Document Reviewed: 09/16/2009 [employer] Patient Information 2012 [employer]. 

## 2011-12-31 NOTE — ED Notes (Signed)
Pt. Stated, the doppler study was Neg.  The Dr. Nathaniel Man my calf and the rt. Was 15 and the left was 13inches

## 2011-12-31 NOTE — ED Provider Notes (Signed)
History     CSN: [id number]  Arrival date & time 12/31/11  1344   First MD Initiated Contact with Patient 12/31/11 1658      Chief Complaint  Patient presents with  . Leg Swelling    (Consider location/radiation/quality/duration/timing/severity/associated sxs/prior treatment) HPI Comments: Patient saw her regular doctor and they did a Doppler and plain films which were negative  Patient is a 51 y.o. female presenting with leg pain. The history is provided by the patient and the spouse.  Leg Pain  Incident onset: 3 weeks ago. The incident occurred at home. There was no injury mechanism. The pain location is generalized (Swelling is present in both legs but worse on the right). The quality of the pain is described as aching. The pain is at a severity of 4/10. The pain is moderate. The pain has been constant since onset. Pertinent negatives include no numbness, no inability to bear weight, no muscle weakness, no loss of sensation and no tingling. The symptoms are aggravated by activity, bearing weight and palpation. She has tried elevation, NSAIDs, acetaminophen and rest for the symptoms. The treatment provided no relief.    Past Medical History  Diagnosis Date  . Anxiety   . Asthma   . Hypertension   . Hyperlipidemia   . GERD (gastroesophageal reflux disease)   . Anemia   . TIA (transient ischemic attack)     silent stroke  . Dizziness - light-headed   . Shoulder pain, left   . Bulging discs   . Sleep apnea     Past Surgical History  Procedure Date  . Cervical disc surgery   . Abdominal hysterectomy   . Umbilical hernia repair   . Replacement total knee     bil  . Dilation and curettage of uterus     2000    Family History  Problem Relation Age of Onset  . Diabetes Mother   . Kidney disease Father   . Breast cancer Sister   . Diabetes Sister     History  Substance Use Topics  . Smoking status: Former Smoker    Types: Cigarettes    Quit date: 10/02/1999  .  Smokeless tobacco: Never Used  . Alcohol Use: No    OB History    Grav Para Term Preterm Abortions TAB SAB Ect Mult Living                  Review of Systems  Constitutional: Negative for fever.  Respiratory: Negative for cough, chest tightness and shortness of breath.   Cardiovascular: Positive for leg swelling. Negative for chest pain and palpitations.  Gastrointestinal: Negative for nausea.  Neurological: Negative for tingling and numbness.  All other systems reviewed and are negative.    Allergies  Review of patient's allergies indicates no known allergies.  Home Medications   Current Outpatient Rx  Name Route Sig Dispense Refill  . RISAQUAD PO CAPS Oral Take 1 capsule by mouth daily.    . ALBUTEROL SULFATE HFA 108 (90 BASE) MCG/ACT IN AERS Inhalation Inhale 2 puffs into the lungs daily as needed. For wheezing.    . ATORVASTATIN CALCIUM 20 MG PO TABS Oral Take 20 mg by mouth daily.    . BECLOMETHASONE DIPROPIONATE 80 MCG/ACT IN AERS Inhalation Inhale 2 puffs into the lungs 2 (two) times daily.    Marland Kitchen CALCIUM-VITAMIN D 500-200 MG-UNIT PO TABS Oral Take 1 tablet by mouth daily.      Marland Kitchen CICLESONIDE 50 MCG/ACT NA SUSP  Each Nare Place 2 sprays into both nostrils 2 (two) times daily.    Marland Kitchen DIAZEPAM 10 MG PO TABS Oral Take 10 mg by mouth every 8 (eight) hours as needed. For anxiety.    Marland Kitchen DICLOFENAC SODIUM 75 MG PO TBEC Oral Take 75 mg by mouth 2 (two) times daily.    Marland Kitchen DICLOFENAC SODIUM 1.5 % TD SOLN Transdermal Place 1 each onto the skin daily as needed. For knee pain.    Marland Kitchen FERROUS SULFATE 325 (65 FE) MG PO TABS Oral Take 325 mg by mouth 2 (two) times daily.    . FUROSEMIDE 20 MG PO TABS Oral Take 20 mg by mouth daily.      Marland Kitchen HYDROXYZINE HCL 25 MG PO TABS Oral Take 25 mg by mouth every 6 (six) hours as needed. For itching.    Marland Kitchen KRILL OIL OMEGA-3 300 MG PO CAPS Oral Take 1 tablet by mouth daily.    Marland Kitchen LORATADINE 10 MG PO TABS Oral Take 20 mg by mouth daily.     Marland Kitchen LORAZEPAM 0.5 MG PO  TABS Oral Take 0.5 mg by mouth daily as needed. For anxiety.    . ADULT MULTIVITAMIN W/MINERALS CH Oral Take 1 tablet by mouth daily.    Marland Kitchen NAPROXEN SODIUM 220 MG PO TABS Oral Take 220 mg by mouth 2 (two) times daily with a meal.     . OLOPATADINE HCL 0.2 % OP SOLN Ophthalmic Apply 1 drop to eye daily as needed. For dry eyes.    . OMEPRAZOLE 20 MG PO CPDR Oral Take 20 mg by mouth 2 (two) times daily.     Marland Kitchen SALINE NASAL SPRAY 0.65 % NA SOLN Nasal Place 2 sprays into the nose daily as needed. For congestion.    Marland Kitchen VALSARTAN-HYDROCHLOROTHIAZIDE 160-12.5 MG PO TABS Oral Take 1 tablet by mouth daily.    Marland Kitchen VITAMIN B-12 1000 MCG PO TABS Oral Take 1,000 mcg by mouth daily.    Marland Kitchen ZOLPIDEM TARTRATE 10 MG PO TABS Oral Take 10 mg by mouth at bedtime as needed. For sleep.      BP 123/76  Pulse 87  Temp(Src) 98 F (36.7 C) (Oral)  Resp 16  SpO2 96%  Physical Exam  Nursing note and vitals reviewed. Constitutional: She is oriented to person, place, and time. She appears well-developed and well-nourished. She appears distressed.  HENT:  Head: Normocephalic and atraumatic.  Eyes: EOM are normal. Pupils are equal, round, and reactive to light.  Cardiovascular: Regular rhythm, normal heart sounds and intact distal pulses.  Tachycardia present.  Exam reveals no friction rub.   No murmur heard. Pulmonary/Chest: Effort normal and breath sounds normal. She has no wheezes. She has no rales.  Abdominal: Soft. Bowel sounds are normal. She exhibits no distension. There is no tenderness. There is no rebound and no guarding.  Musculoskeletal: Normal range of motion. She exhibits edema. She exhibits no tenderness.       Nonpitting edema in bilateral lower extremities greater on the right. Normal pulses and capillary refill less than 2 seconds  Neurological: She is alert and oriented to person, place, and time. No cranial nerve deficit.  Skin: Skin is warm and dry. No rash noted.  Psychiatric: She has a normal mood and  affect. Her behavior is normal.    ED Course  Procedures (including critical care time)  Labs Reviewed - No data to display No results found.   No diagnosis found.    MDM   Patient  presenting for 3 weeks Works for many edema worse on the right. Patient has mild tenderness in her leg as well since the edema has been present. She was evaluated by her doctor and had an ultrasound done of her leg that was negative for DVT and a normal x-ray. She denies any trauma. She denies any symptoms suggestive of congestive heart failure such as chest pain, shortness of breath or abdominal swelling. She has moderate nonpitting edema of her right leg and also present in her left. When discussing with the patient the swelling started shortly after she stopped chronic steroid use and she is also recently change blood pressure medications that could also be the cause of the swelling. She has not recently had her renal function checked so will ensure no change in her renal function.  She is continued to take her furosemide without any change. Feel this is most likely a medication reaction discuss this with her and her husband and she'll followup with her doctor.  I-STAT within normal limits. Patient will follow up with her doctor.      Gwyneth Sprout, MD 12/31/11 1811

## 2011-12-31 NOTE — ED Notes (Signed)
Pt. Stated, I've had rt. Leg swelling ,  A doppler study was done this past Tuesday. Leg is very painful to touch.

## 2012-02-29 ENCOUNTER — Other Ambulatory Visit: Payer: Self-pay | Admitting: Neurological Surgery

## 2012-02-29 DIAGNOSIS — M545 Low back pain, unspecified: Secondary | ICD-10-CM

## 2012-03-09 ENCOUNTER — Ambulatory Visit
Admission: RE | Admit: 2012-03-09 | Discharge: 2012-03-09 | Disposition: A | Discharge status: Home / Self Care | Payer: 59 | Source: Ambulatory Visit | Attending: Neurological Surgery | Admitting: Neurological Surgery

## 2012-03-09 DIAGNOSIS — M545 Low back pain, unspecified: Secondary | ICD-10-CM

## 2012-04-12 DIAGNOSIS — M7540 Impingement syndrome of unspecified shoulder: Secondary | ICD-10-CM

## 2012-04-12 DIAGNOSIS — M25819 Other specified joint disorders, unspecified shoulder: Secondary | ICD-10-CM

## 2012-04-12 HISTORY — DX: Impingement syndrome of unspecified shoulder: M75.40

## 2012-04-12 HISTORY — DX: Other specified joint disorders, unspecified shoulder: M25.819

## 2012-05-08 ENCOUNTER — Encounter (HOSPITAL_BASED_OUTPATIENT_CLINIC_OR_DEPARTMENT_OTHER): Payer: Self-pay | Admitting: *Deleted

## 2012-05-08 NOTE — Pre-Procedure Instructions (Signed)
To come for BMET Office note, cardiology testing, EKG req. from [facility]

## 2012-05-08 NOTE — Pre-Procedure Instructions (Signed)
History reviewed by Dr. Gypsy Balsam, pt. OK to come for surgery

## 2012-05-10 ENCOUNTER — Encounter (HOSPITAL_BASED_OUTPATIENT_CLINIC_OR_DEPARTMENT_OTHER)
Admission: RE | Admit: 2012-05-10 | Discharge: 2012-05-10 | Disposition: A | Discharge status: Home / Self Care | Payer: 59 | Source: Ambulatory Visit | Attending: Orthopedic Surgery | Admitting: Orthopedic Surgery

## 2012-05-10 LAB — BASIC METABOLIC PANEL
BUN: 11 mg/dL (ref 6–23)
CO2: 29 mEq/L (ref 19–32)
Calcium: 10.4 mg/dL (ref 8.4–10.5)
Chloride: 91 mEq/L — ABNORMAL LOW (ref 96–112)
Creatinine, Ser: 0.84 mg/dL (ref 0.50–1.10)
GFR calc Af Amer: >90 mL/min (ref >90)
GFR calc non Af Amer: 80 mL/min — ABNORMAL LOW (ref >90)
Glucose, Bld: 122 mg/dL — ABNORMAL HIGH (ref 70–99)
Potassium: 3.3 mEq/L — ABNORMAL LOW (ref 3.5–5.1)
Sodium: 134 mEq/L — ABNORMAL LOW (ref 135–145)

## 2012-05-15 ENCOUNTER — Encounter (HOSPITAL_BASED_OUTPATIENT_CLINIC_OR_DEPARTMENT_OTHER)
Admission: RE | Disposition: A | Discharge status: Home / Self Care | Payer: Self-pay | Source: Ambulatory Visit | Attending: Orthopedic Surgery

## 2012-05-15 ENCOUNTER — Ambulatory Visit (HOSPITAL_BASED_OUTPATIENT_CLINIC_OR_DEPARTMENT_OTHER): Payer: 59 | Admitting: *Deleted

## 2012-05-15 ENCOUNTER — Encounter (HOSPITAL_BASED_OUTPATIENT_CLINIC_OR_DEPARTMENT_OTHER): Payer: Self-pay | Admitting: *Deleted

## 2012-05-15 ENCOUNTER — Encounter: Payer: Self-pay | Admitting: Physician Assistant

## 2012-05-15 ENCOUNTER — Other Ambulatory Visit: Payer: Self-pay | Admitting: Physician Assistant

## 2012-05-15 ENCOUNTER — Ambulatory Visit (HOSPITAL_BASED_OUTPATIENT_CLINIC_OR_DEPARTMENT_OTHER)
Admission: RE | Admit: 2012-05-15 | Discharge: 2012-05-16 | Disposition: A | Discharge status: Home / Self Care | Payer: 59 | Source: Ambulatory Visit | Attending: Orthopedic Surgery | Admitting: Orthopedic Surgery

## 2012-05-15 ENCOUNTER — Encounter (HOSPITAL_BASED_OUTPATIENT_CLINIC_OR_DEPARTMENT_OTHER): Payer: Self-pay

## 2012-05-15 DIAGNOSIS — D649 Anemia, unspecified: Secondary | ICD-10-CM | POA: Insufficient documentation

## 2012-05-15 DIAGNOSIS — G4730 Sleep apnea, unspecified: Secondary | ICD-10-CM | POA: Insufficient documentation

## 2012-05-15 DIAGNOSIS — Z8673 Personal history of transient ischemic attack (TIA), and cerebral infarction without residual deficits: Secondary | ICD-10-CM | POA: Insufficient documentation

## 2012-05-15 DIAGNOSIS — M47812 Spondylosis without myelopathy or radiculopathy, cervical region: Secondary | ICD-10-CM | POA: Diagnosis present

## 2012-05-15 DIAGNOSIS — IMO0002 Reserved for concepts with insufficient information to code with codable children: Secondary | ICD-10-CM

## 2012-05-15 DIAGNOSIS — E7800 Pure hypercholesterolemia, unspecified: Secondary | ICD-10-CM | POA: Insufficient documentation

## 2012-05-15 DIAGNOSIS — M75110 Incomplete rotator cuff tear or rupture of unspecified shoulder, not specified as traumatic: Secondary | ICD-10-CM | POA: Diagnosis present

## 2012-05-15 DIAGNOSIS — Z96659 Presence of unspecified artificial knee joint: Secondary | ICD-10-CM | POA: Insufficient documentation

## 2012-05-15 DIAGNOSIS — E785 Hyperlipidemia, unspecified: Secondary | ICD-10-CM

## 2012-05-15 DIAGNOSIS — K219 Gastro-esophageal reflux disease without esophagitis: Secondary | ICD-10-CM | POA: Insufficient documentation

## 2012-05-15 DIAGNOSIS — G4733 Obstructive sleep apnea (adult) (pediatric): Secondary | ICD-10-CM | POA: Diagnosis present

## 2012-05-15 DIAGNOSIS — I10 Essential (primary) hypertension: Secondary | ICD-10-CM | POA: Diagnosis present

## 2012-05-15 DIAGNOSIS — M25819 Other specified joint disorders, unspecified shoulder: Secondary | ICD-10-CM | POA: Diagnosis present

## 2012-05-15 DIAGNOSIS — M67919 Unspecified disorder of synovium and tendon, unspecified shoulder: Secondary | ICD-10-CM | POA: Insufficient documentation

## 2012-05-15 DIAGNOSIS — M719 Bursopathy, unspecified: Secondary | ICD-10-CM | POA: Insufficient documentation

## 2012-05-15 DIAGNOSIS — Z87440 Personal history of urinary (tract) infections: Secondary | ICD-10-CM | POA: Insufficient documentation

## 2012-05-15 DIAGNOSIS — M24819 Other specific joint derangements of unspecified shoulder, not elsewhere classified: Secondary | ICD-10-CM | POA: Insufficient documentation

## 2012-05-15 DIAGNOSIS — Z90710 Acquired absence of both cervix and uterus: Secondary | ICD-10-CM | POA: Insufficient documentation

## 2012-05-15 DIAGNOSIS — M75120 Complete rotator cuff tear or rupture of unspecified shoulder, not specified as traumatic: Secondary | ICD-10-CM

## 2012-05-15 DIAGNOSIS — J45909 Unspecified asthma, uncomplicated: Secondary | ICD-10-CM | POA: Insufficient documentation

## 2012-05-15 DIAGNOSIS — M7540 Impingement syndrome of unspecified shoulder: Secondary | ICD-10-CM

## 2012-05-15 HISTORY — DX: Reserved for concepts with insufficient information to code with codable children: IMO0002

## 2012-05-15 HISTORY — DX: Impingement syndrome of unspecified shoulder: M75.40

## 2012-05-15 HISTORY — DX: Headache: R51

## 2012-05-15 HISTORY — DX: Personal history of urinary (tract) infections: Z87.440

## 2012-05-15 HISTORY — DX: Personal history of transient ischemic attack (TIA), and cerebral infarction without residual deficits: Z86.73

## 2012-05-15 SURGERY — SHOULDER ARTHROSCOPY WITH ROTATOR CUFF REPAIR AND SUBACROMIAL DECOMPRESSION
Anesthesia: General | Site: Shoulder | Laterality: Left | Wound class: Clean

## 2012-05-15 MED ORDER — FENTANYL CITRATE 0.05 MG/ML IJ SOLN: 50.0000 ug | INTRAMUSCULAR | Status: DC | PRN

## 2012-05-15 MED ORDER — DEXAMETHASONE SODIUM PHOSPHATE 4 MG/ML IJ SOLN
INTRAMUSCULAR | Status: DC | PRN
  Administered 2012-05-15: 10 mg via INTRAVENOUS

## 2012-05-15 MED ORDER — FENTANYL CITRATE 0.05 MG/ML IJ SOLN
INTRAMUSCULAR | Status: DC | PRN
  Administered 2012-05-15 (×2): 25 ug via INTRAVENOUS

## 2012-05-15 MED ORDER — LIDOCAINE HCL (CARDIAC) 20 MG/ML IV SOLN
INTRAVENOUS | Status: DC | PRN
  Administered 2012-05-15: 60 mg via INTRAVENOUS

## 2012-05-15 MED ORDER — ONDANSETRON HCL 4 MG/2ML IJ SOLN: 4.0000 mg | Freq: Four times a day (QID) | INTRAMUSCULAR | Status: DC | PRN

## 2012-05-15 MED ORDER — CEFAZOLIN SODIUM-DEXTROSE 2-3 GM-% IV SOLR
2.0000 g | Freq: Four times a day (QID) | INTRAVENOUS | Status: AC
  Administered 2012-05-15 – 2012-05-16 (×2): 2 g via INTRAVENOUS

## 2012-05-15 MED ORDER — PROPOFOL 10 MG/ML IV BOLUS
INTRAVENOUS | Status: DC | PRN
  Administered 2012-05-15: 200 mg via INTRAVENOUS
  Administered 2012-05-15: 20 mg via INTRAVENOUS

## 2012-05-15 MED ORDER — DOCUSATE SODIUM 100 MG PO CAPS: 100.0000 mg | ORAL_CAPSULE | Freq: Two times a day (BID) | ORAL | Status: DC

## 2012-05-15 MED ORDER — SODIUM CHLORIDE 0.9 % IV SOLN
INTRAVENOUS | Status: DC
  Administered 2012-05-15: 16:00:00 via INTRAVENOUS

## 2012-05-15 MED ORDER — ONDANSETRON HCL 4 MG PO TABS: 4.0000 mg | ORAL_TABLET | Freq: Four times a day (QID) | ORAL | Status: DC | PRN

## 2012-05-15 MED ORDER — CEFAZOLIN SODIUM-DEXTROSE 2-3 GM-% IV SOLR: 2.0000 g | Freq: Four times a day (QID) | INTRAVENOUS | Status: DC

## 2012-05-15 MED ORDER — MIDAZOLAM HCL 2 MG/2ML IJ SOLN: 1.0000 mg | INTRAMUSCULAR | Status: DC | PRN

## 2012-05-15 MED ORDER — FENTANYL CITRATE 0.05 MG/ML IJ SOLN
100.0000 ug | INTRAMUSCULAR | Status: DC | PRN
  Administered 2012-05-15: 100 ug via INTRAVENOUS

## 2012-05-15 MED ORDER — DEXTROSE 5 % IV SOLN: 3.0000 g | INTRAVENOUS | Status: DC

## 2012-05-15 MED ORDER — DEXTROSE 5 % IV SOLN
3.0000 g | INTRAVENOUS | Status: DC | PRN
  Administered 2012-05-15: 3 g via INTRAVENOUS

## 2012-05-15 MED ORDER — LACTATED RINGERS IV SOLN: INTRAVENOUS | Status: DC

## 2012-05-15 MED ORDER — OXYCODONE-ACETAMINOPHEN 5-325 MG PO TABS
1.0000 | ORAL_TABLET | ORAL | Status: DC | PRN
  Administered 2012-05-15 – 2012-05-16 (×2): 2 via ORAL

## 2012-05-15 MED ORDER — METHOCARBAMOL 500 MG PO TABS: ORAL_TABLET | ORAL | Status: DC

## 2012-05-15 MED ORDER — METOCLOPRAMIDE HCL 5 MG PO TABS: 5.0000 mg | ORAL_TABLET | Freq: Three times a day (TID) | ORAL | Status: DC | PRN

## 2012-05-15 MED ORDER — MIDAZOLAM HCL 2 MG/2ML IJ SOLN
2.0000 mg | INTRAMUSCULAR | Status: DC | PRN
  Administered 2012-05-15: 2 mg via INTRAVENOUS

## 2012-05-15 MED ORDER — SUCCINYLCHOLINE CHLORIDE 20 MG/ML IJ SOLN
INTRAMUSCULAR | Status: DC | PRN
  Administered 2012-05-15: 120 mg via INTRAVENOUS

## 2012-05-15 MED ORDER — BISACODYL 5 MG PO TBEC: 10.0000 mg | DELAYED_RELEASE_TABLET | Freq: Every day | ORAL | Status: DC

## 2012-05-15 MED ORDER — METOCLOPRAMIDE HCL 5 MG/ML IJ SOLN: 5.0000 mg | Freq: Three times a day (TID) | INTRAMUSCULAR | Status: DC | PRN

## 2012-05-15 MED ORDER — METHOCARBAMOL 100 MG/ML IJ SOLN: 500.0000 mg | Freq: Four times a day (QID) | INTRAVENOUS | Status: DC | PRN

## 2012-05-15 MED ORDER — LACTATED RINGERS IV SOLN
INTRAVENOUS | Status: DC
  Administered 2012-05-15: 12:00:00 via INTRAVENOUS

## 2012-05-15 MED ORDER — HYDROMORPHONE HCL PF 1 MG/ML IJ SOLN: 0.2500 mg | INTRAMUSCULAR | Status: DC | PRN

## 2012-05-15 MED ORDER — HYDROMORPHONE HCL PF 1 MG/ML IJ SOLN: 0.5000 mg | INTRAMUSCULAR | Status: DC | PRN

## 2012-05-15 MED ORDER — METHOCARBAMOL 500 MG PO TABS: 500.0000 mg | ORAL_TABLET | Freq: Four times a day (QID) | ORAL | Status: DC | PRN

## 2012-05-15 MED ORDER — CHLORHEXIDINE GLUCONATE 4 % EX LIQD: 60.0000 mL | Freq: Once | CUTANEOUS | Status: DC

## 2012-05-15 MED ORDER — OXYCODONE-ACETAMINOPHEN 5-325 MG PO TABS: ORAL_TABLET | ORAL | Status: DC

## 2012-05-15 MED ORDER — ONDANSETRON HCL 4 MG/2ML IJ SOLN
INTRAMUSCULAR | Status: DC | PRN
  Administered 2012-05-15: 4 mg via INTRAVENOUS

## 2012-05-15 MED ORDER — BUPIVACAINE-EPINEPHRINE PF 0.5-1:200000 % IJ SOLN
INTRAMUSCULAR | Status: DC | PRN
  Administered 2012-05-15: 30 mL

## 2012-05-15 SURGICAL SUPPLY — 78 items
ANCH SUT SWLK 19.1X5.5 CLS EL (Anchor) ×1 IMPLANT
ANCHOR PEEK SWIVEL LOCK 5.5 (Anchor) ×1 IMPLANT
APL SKNCLS STERI-STRIP NONHPOA (GAUZE/BANDAGES/DRESSINGS)
BENZOIN TINCTURE PRP APPL 2/3 (GAUZE/BANDAGES/DRESSINGS) IMPLANT
BLADE CUDA 5.5 (BLADE) IMPLANT
BLADE CUTTER GATOR 3.5 (BLADE) ×2 IMPLANT
BLADE GREAT WHITE 4.2 (BLADE) IMPLANT
BLADE SURG 15 STRL LF DISP TIS (BLADE) IMPLANT
BLADE SURG 15 STRL SS (BLADE)
BLADE SURG ROTATE 9660 (MISCELLANEOUS) IMPLANT
BNDG COHESIVE 4X5 TAN STRL (GAUZE/BANDAGES/DRESSINGS) ×2 IMPLANT
BUR OVAL 6.0 (BURR) ×2 IMPLANT
CANISTER OMNI JUG 16 LITER (MISCELLANEOUS) ×2 IMPLANT
CANISTER SUCTION 2500CC (MISCELLANEOUS) IMPLANT
CANNULA TWIST IN 8.25X7CM (CANNULA) IMPLANT
DECANTER SPIKE VIAL GLASS SM (MISCELLANEOUS) IMPLANT
DRAPE SHOULDER BEACH CHAIR (DRAPES) ×2 IMPLANT
DRAPE U-SHAPE 47X51 STRL (DRAPES) ×4 IMPLANT
DRSG PAD ABDOMINAL 8X10 ST (GAUZE/BANDAGES/DRESSINGS) ×2 IMPLANT
DURAPREP 26ML APPLICATOR (WOUND CARE) ×2 IMPLANT
ELECT REM PT RETURN 9FT ADLT (ELECTROSURGICAL)
ELECTRODE REM PT RTRN 9FT ADLT (ELECTROSURGICAL) ×1 IMPLANT
GAUZE XEROFORM 1X8 LF (GAUZE/BANDAGES/DRESSINGS) ×2 IMPLANT
GLOVE BIO SURGEON STRL SZ 6.5 (GLOVE) ×1 IMPLANT
GLOVE BIO SURGEON STRL SZ7 (GLOVE) IMPLANT
GLOVE BIOGEL PI IND STRL 7.0 (GLOVE) IMPLANT
GLOVE BIOGEL PI IND STRL 7.5 (GLOVE) ×1 IMPLANT
GLOVE BIOGEL PI INDICATOR 7.0 (GLOVE) ×1
GLOVE BIOGEL PI INDICATOR 7.5 (GLOVE) ×1
GLOVE INDICATOR 7.0 STRL GRN (GLOVE) ×1 IMPLANT
GLOVE SS BIOGEL STRL SZ 7.5 (GLOVE) ×1 IMPLANT
GLOVE SUPERSENSE BIOGEL SZ 7.5 (GLOVE) ×1
GOWN PREVENTION PLUS XLARGE (GOWN DISPOSABLE) ×4 IMPLANT
NDL 1/2 CIR CATGUT .05X1.09 (NEEDLE) IMPLANT
NDL SAFETY ECLIPSE 18X1.5 (NEEDLE) ×1 IMPLANT
NDL SCORPION MULTI FIRE (NEEDLE) IMPLANT
NDL SUT 6 .5 CRC .975X.05 MAYO (NEEDLE) IMPLANT
NEEDLE 1/2 CIR CATGUT .05X1.09 (NEEDLE) IMPLANT
NEEDLE HYPO 18GX1.5 SHARP (NEEDLE) ×2
NEEDLE MAYO TAPER (NEEDLE)
NEEDLE SCORPION MULTI FIRE (NEEDLE) IMPLANT
PACK ARTHROSCOPY DSU (CUSTOM PROCEDURE TRAY) ×2 IMPLANT
PACK BASIN DAY SURGERY FS (CUSTOM PROCEDURE TRAY) ×2 IMPLANT
PAD ALCOHOL SWAB (MISCELLANEOUS) ×4 IMPLANT
PENCIL BUTTON HOLSTER BLD 10FT (ELECTRODE) IMPLANT
SET ARTHROSCOPY TUBING (MISCELLANEOUS) ×2
SET ARTHROSCOPY TUBING LN (MISCELLANEOUS) ×1 IMPLANT
SHEET MEDIUM DRAPE 40X70 STRL (DRAPES) ×1 IMPLANT
SLEEVE SCD COMPRESS KNEE MED (MISCELLANEOUS) ×1 IMPLANT
SLING ARM FOAM STRAP LRG (SOFTGOODS) ×1 IMPLANT
SLING ARM FOAM STRAP MED (SOFTGOODS) IMPLANT
SLING ARM FOAM STRAP XLG (SOFTGOODS) IMPLANT
SLING ARM IMMOBILIZER MED (SOFTGOODS) IMPLANT
SLING ULTRA III MED (ORTHOPEDIC SUPPLIES) ×1 IMPLANT
SPONGE GAUZE 4X4 12PLY (GAUZE/BANDAGES/DRESSINGS) ×2 IMPLANT
SPONGE LAP 4X18 X RAY DECT (DISPOSABLE) IMPLANT
STRIP CLOSURE SKIN 1/2X4 (GAUZE/BANDAGES/DRESSINGS) IMPLANT
SUCTION FRAZIER TIP 10 FR DISP (SUCTIONS) IMPLANT
SUT ETHILON 3 0 PS 1 (SUTURE) ×2 IMPLANT
SUT FIBERWIRE #2 38 T-5 BLUE (SUTURE)
SUT PDS AB 2-0 CT2 27 (SUTURE) IMPLANT
SUT PROLENE 3 0 PS 2 (SUTURE) IMPLANT
SUT TIGER TAPE 7 IN WHITE (SUTURE) IMPLANT
SUT VIC AB 0 SH 27 (SUTURE) IMPLANT
SUT VIC AB 2-0 PS2 27 (SUTURE) IMPLANT
SUT VIC AB 2-0 SH 27 (SUTURE)
SUT VIC AB 2-0 SH 27XBRD (SUTURE) IMPLANT
SUTURE FIBERWR #2 38 T-5 BLUE (SUTURE) IMPLANT
SYR 20CC LL (SYRINGE) IMPLANT
SYR 5ML LL (SYRINGE) ×1 IMPLANT
SYR BULB 3OZ (MISCELLANEOUS) IMPLANT
TAPE FIBER 2MM 7IN #2 BLUE (SUTURE) IMPLANT
TAPE HYPAFIX 6X30 (GAUZE/BANDAGES/DRESSINGS) IMPLANT
TAPE STRIPS DRAPE STRL (GAUZE/BANDAGES/DRESSINGS) ×2 IMPLANT
TOWEL OR 17X24 6PK STRL BLUE (TOWEL DISPOSABLE) ×2 IMPLANT
TUBE CONNECTING 20X1/4 (TUBING) ×1 IMPLANT
WAND STAR VAC 90 (SURGICAL WAND) ×2 IMPLANT
WATER STERILE IRR 1000ML POUR (IV SOLUTION) ×2 IMPLANT

## 2012-05-15 NOTE — Progress Notes (Signed)
Assisted Dr. Hodierne with left, ultrasound guided, interscalene  block. Side rails up, monitors on throughout procedure. See vital signs in flow sheet. Tolerated Procedure well. 

## 2012-05-15 NOTE — H&P (Signed)
[name] is an 50 y.o. female.   Chief Complaint: left shoulder partial rotator cuff tear HPI: [name] is seen for follow-up from her significant persistent left shoulder pain. She had an MRI of the left shoulder on 03/09/12 that showed a partial rotator cuff tear with impingement. We injected her shoulder which helped for less than a week. She has pain with overhead use and activity relieved by rest. She has swelling in both lower legs that she feels is secondary to fluid retention. She's status post bilateral total knee replacements and her knees are not bothering her.  Past Medical History  Diagnosis Date  . Hyperlipidemia   . GERD (gastroesophageal reflux disease)   . Headache     migraine-like  . Degenerative disc disease   . History of UTI   . Sleep apnea     uses CPAP nightly  . Shoulder impingement 04/2012    left  . Asthma     daily and prn inhalers  . Hypertension     under control, has been on med. x 4-5 yrs.  . Anemia     takes iron supplement  . History of TIA (transient ischemic attack) early 2013    no weakness or deficits    Past Surgical History  Procedure Date  . Umbilical hernia repair 03/28/2002  . Dilation and curettage of uterus     2000  . Anterior cervical decomp/discectomy fusion 02/18/2010    C6-7  . Total knee arthroplasty 09/07/2009    left  . Total knee arthroplasty 10/13/2008    right  . Abdominal hysterectomy 03/28/2002    complete    Family History  Problem Relation Age of Onset  . Diabetes Mother   . Kidney disease Father   . Breast cancer Sister   . Diabetes Sister    Social History:  reports that she quit smoking about 12 years ago. She has never used smokeless tobacco. She reports that she does not drink alcohol or use illicit drugs.  Allergies: No Known Allergies  Current Facility-Administered Medications on File Prior to Visit  Medication Dose Route Frequency Provider Last Rate Last Dose  . fentaNYL (SUBLIMAZE) injection  100 mcg  100 mcg Intravenous PRN Adam S Hodierne, MD   100 mcg at 05/15/12 1150  . lactated ringers infusion   Intravenous Continuous Lee Kasik, MD 20 mL/hr at 05/15/12 1144    . midazolam (VERSED) injection 2 mg  2 mg Intravenous PRN Adam S Hodierne, MD   2 mg at 05/15/12 1150  . DISCONTD: fentaNYL (SUBLIMAZE) injection 50 mcg  50 mcg Intravenous PRN Adam S Hodierne, MD      . DISCONTD: midazolam (VERSED) injection 1 mg  1 mg Intravenous PRN Adam S Hodierne, MD       Current Outpatient Prescriptions on File Prior to Visit  Medication Sig Dispense Refill  . acidophilus (RISAQUAD) CAPS Take 1 capsule by mouth daily.      . albuterol (PROVENTIL HFA;VENTOLIN HFA) 108 (90 BASE) MCG/ACT inhaler Inhale 2 puffs into the lungs daily as needed. For wheezing.      . amoxicillin (AMOXIL) 500 MG capsule Take 1,000 mg by mouth once.      . atorvastatin (LIPITOR) 20 MG tablet Take 20 mg by mouth daily.      . beclomethasone (QVAR) 80 MCG/ACT inhaler Inhale 2 puffs into the lungs 2 (two) times daily.      . Calcium Carbonate-Vitamin D (CALCIUM-VITAMIN D) 500-200 MG-UNIT per tablet   Take 1 tablet by mouth daily.        . diclofenac (VOLTAREN) 75 MG EC tablet Take 75 mg by mouth 2 (two) times daily.      . ferrous sulfate 325 (65 FE) MG tablet Take 325 mg by mouth 2 (two) times daily.      . furosemide (LASIX) 20 MG tablet Take 40 mg by mouth daily.       . hydrOXYzine (ATARAX/VISTARIL) 25 MG tablet Take 25 mg by mouth every 8 (eight) hours as needed. For itching.      . Krill Oil Omega-3 300 MG CAPS Take 1 tablet by mouth daily.      . loratadine (CLARITIN) 10 MG tablet Take 20 mg by mouth daily.       . LORazepam (ATIVAN) 0.5 MG tablet Take 0.5 mg by mouth daily as needed. For anxiety.      . meloxicam (MOBIC) 7.5 MG tablet Take 7.5 mg by mouth daily.      . mometasone (NASONEX) 50 MCG/ACT nasal spray Place 2 sprays into the nose daily.      . Multiple Vitamin (MULITIVITAMIN WITH MINERALS) TABS Take 1 tablet  by mouth daily.      . naproxen sodium (ANAPROX) 220 MG tablet Take 220 mg by mouth 2 (two) times daily with a meal.       . Olopatadine HCl 0.2 % SOLN Apply 1 drop to eye daily as needed. For dry eyes.      . omeprazole (PRILOSEC) 20 MG capsule Take 20 mg by mouth 2 (two) times daily.       . valsartan-hydrochlorothiazide (DIOVAN-HCT) 160-12.5 MG per tablet Take 1 tablet by mouth daily.      . vitamin B-12 (CYANOCOBALAMIN) 1000 MCG tablet Take 1,000 mcg by mouth daily.      . zolpidem (AMBIEN) 10 MG tablet Take 10 mg by mouth at bedtime as needed. For sleep.        (Not in a hospital admission)  No results found for this or any previous visit (from the past 48 hour(s)). No results found.  Review of Systems  Constitutional: Negative.   HENT: Negative.   Eyes: Negative.   Respiratory: Negative.   Cardiovascular: Negative.   Gastrointestinal: Negative.   Genitourinary: Negative.   Musculoskeletal: Positive for joint pain.       Left shoulder pain  Skin: Negative.   Neurological: Negative.   Endo/Heme/Allergies: Negative.   Psychiatric/Behavioral: Negative.     There were no vitals taken for this visit. Physical Exam  Constitutional: She is oriented to person, place, and time. She appears well-developed and well-nourished.  HENT:  Head: Normocephalic and atraumatic.  Mouth/Throat: Oropharynx is clear and moist.  Eyes: EOM are normal. Pupils are equal, round, and reactive to light.  Neck: Normal range of motion. Neck supple.  Cardiovascular: Normal rate, regular rhythm and normal heart sounds.   Respiratory: Effort normal and breath sounds normal.  GI: Soft.  Musculoskeletal:       Left shoulder reveals FF 160, ABD 160, 80 internal and external rotation.  2+ radial pulse  Neurological: She is alert and oriented to person, place, and time.  Skin: Skin is warm and dry.  Psychiatric: She has a normal mood and affect. Her behavior is normal.     Assessment Patient Active  Problem List  Diagnosis  . Hyperlipidemia  . Degenerative disc disease  . Sleep apnea  . Shoulder impingement  . Hypertension  . History of   TIA (transient ischemic attack)  . Partial tear of rotator cuff    Plan I talk to her about this in detail. I do think with her significant pain and lack of response to conservative care with findings on MRI that I'd recommend left shoulder arthroscopy with attention to her rotator cuff pathology with debridement versus repair. Discussed risks benefits and possible complications of the surgery in detail and she understands this completely. We'll plan on setting her up for this at some point in the near future. I also recommend that she see Dr. Russo for her lower extremity edema and fluid retention.  Nisha Dhami J 05/15/2012, 12:28 PM    

## 2012-05-15 NOTE — Anesthesia Procedure Notes (Addendum)
Anesthesia Regional Block:  Interscalene brachial plexus block  Pre-Anesthetic Checklist: ,, timeout performed, Correct Patient, Correct Site, Correct Laterality, Correct Procedure, Correct Position, site marked, Risks and benefits discussed,  Surgical consent,  Pre-op evaluation,  At surgeon's request and post-op pain management  Laterality: Left  Prep: chloraprep       Needles:  Injection technique: Single-shot  Needle Type: Echogenic Stimulator Needle     Needle Length: 5cm 5 cm Needle Gauge: 22 and 22 G    Additional Needles:  Procedures: ultrasound guided and nerve stimulator Interscalene brachial plexus block  Nerve Stimulator or Paresthesia:  Response: biceps flexion, 0.45 mA,   Additional Responses:   Narrative:  Start time: 05/15/2012 12:02 PM End time: 05/15/2012 12:18 PM Injection made incrementally with aspirations every 5 mL.  Performed by: Personally  Anesthesiologist: Dr Chaney Malling  Additional Notes: Functioning IV was confirmed and monitors were applied.  A 50mm 22ga Arrow echogenic stimulator needle was used. Sterile prep and drape,hand hygiene and sterile gloves were used.  Negative aspiration and negative test dose prior to incremental administration of local anesthetic. The patient tolerated the procedure well.  Ultrasound guidance: relevent anatomy identified, needle position confirmed, local anesthetic spread visualized around nerve(s), vascular puncture avoided.  Image printed for medical record.   Interscalene brachial plexus block Procedure Name: Intubation Date/Time: 05/15/2012 12:53 PM Performed by: Meyer Russel Pre-anesthesia Checklist: Patient identified, Emergency Drugs available, Suction available and Patient being monitored Patient Re-evaluated:Patient Re-evaluated prior to inductionOxygen Delivery Method: Circle system utilized and Simple face mask Preoxygenation: Pre-oxygenation with 100% oxygen Intubation Type: IV  induction Ventilation: Mask ventilation without difficulty Laryngoscope size: Glidescope. Grade View: Grade II Tube type: Oral Tube size: 7.0 mm Number of attempts: 1 Airway Equipment and Method: LTA kit utilized,  Video-laryngoscopy and Stylet Placement Confirmation: ETT inserted through vocal cords under direct vision,  positive ETCO2 and breath sounds checked- equal and bilateral Secured at: 22 cm Tube secured with: Tape Dental Injury: Teeth and Oropharynx as per pre-operative assessment

## 2012-05-15 NOTE — Interval H&P Note (Signed)
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 After consideration of risks, benefits and other options for treatment, the patient has consented to  Procedure(s) (LRB) with comments: SHOULDER ARTHROSCOPY WITH ROTATOR CUFF REPAIR AND SUBACROMIAL DECOMPRESSION (Left) - left shoulder arthroscopy with decompresson subacromial partial acromioplasty with coracoacromial release distal claviculectomy with rotator cuff repair  as a surgical intervention .  The patient's history has been reviewed, patient examined, no change in status, stable for surgery.  I have reviewed the patient's chart and labs.  Questions were answered to the patient's satisfaction.     Salvatore Marvel A

## 2012-05-15 NOTE — Transfer of Care (Signed)
Immediate Anesthesia Transfer of Care Note  Patient: [name]  Procedure(s) Performed: Procedure(s) (LRB) with comments: SHOULDER ARTHROSCOPY WITH ROTATOR CUFF REPAIR AND SUBACROMIAL DECOMPRESSION (Left) - left shoulder arthroscopy with decompresson subacromial partial acromioplasty with coracoacromial release with rotator cuff repair   Patient Location: PACU  Anesthesia Type: GA combined with regional for post-op pain  Level of Consciousness: awake and oriented  Airway & Oxygen Therapy: Patient Spontanous Breathing and Patient connected to face mask oxygen  Post-op Assessment: Report given to PACU RN, Post -op Vital signs reviewed and stable and Patient moving all extremities  Post vital signs: Reviewed and stable  Complications: No apparent anesthesia complications

## 2012-05-15 NOTE — H&P (View-Only) (Signed)
[name] is an 51 y.o. female.   Chief Complaint: left shoulder partial rotator cuff tear HPI: Ms. Ohlinger is seen for follow-up from her significant persistent left shoulder pain. She had an MRI of the left shoulder on 03/09/12 that showed a partial rotator cuff tear with impingement. We injected her shoulder which helped for less than a week. She has pain with overhead use and activity relieved by rest. She has swelling in both lower legs that she feels is secondary to fluid retention. She's status post bilateral total knee replacements and her knees are not bothering her.  Past Medical History  Diagnosis Date  . Hyperlipidemia   . GERD (gastroesophageal reflux disease)   . Headache     migraine-like  . Degenerative disc disease   . History of UTI   . Sleep apnea     uses CPAP nightly  . Shoulder impingement 04/2012    left  . Asthma     daily and prn inhalers  . Hypertension     under control, has been on med. x 4-5 yrs.  . Anemia     takes iron supplement  . History of TIA (transient ischemic attack) early 2013    no weakness or deficits    Past Surgical History  Procedure Date  . Umbilical hernia repair 03/28/2002  . Dilation and curettage of uterus     2000  . Anterior cervical decomp/discectomy fusion 02/18/2010    C6-7  . Total knee arthroplasty 09/07/2009    left  . Total knee arthroplasty 10/13/2008    right  . Abdominal hysterectomy 03/28/2002    complete    Family History  Problem Relation Age of Onset  . Diabetes Mother   . Kidney disease Father   . Breast cancer Sister   . Diabetes Sister    Social History:  reports that she quit smoking about 12 years ago. She has never used smokeless tobacco. She reports that she does not drink alcohol or use illicit drugs.  Allergies: No Known Allergies  Current Facility-Administered Medications on File Prior to Visit  Medication Dose Route Frequency Provider Last Rate Last Dose  . fentaNYL (SUBLIMAZE) injection  100 mcg  100 mcg Intravenous PRN Raiford Simmonds, MD   100 mcg at 05/15/12 1150  . lactated ringers infusion   Intravenous Continuous Bedelia Person, MD 20 mL/hr at 05/15/12 1144    . midazolam (VERSED) injection 2 mg  2 mg Intravenous PRN Raiford Simmonds, MD   2 mg at 05/15/12 1150  . DISCONTD: fentaNYL (SUBLIMAZE) injection 50 mcg  50 mcg Intravenous PRN Raiford Simmonds, MD      . DISCONTD: midazolam (VERSED) injection 1 mg  1 mg Intravenous PRN Raiford Simmonds, MD       Current Outpatient Prescriptions on File Prior to Visit  Medication Sig Dispense Refill  . acidophilus (RISAQUAD) CAPS Take 1 capsule by mouth daily.      Marland Kitchen albuterol (PROVENTIL HFA;VENTOLIN HFA) 108 (90 BASE) MCG/ACT inhaler Inhale 2 puffs into the lungs daily as needed. For wheezing.      Marland Kitchen amoxicillin (AMOXIL) 500 MG capsule Take 1,000 mg by mouth once.      Marland Kitchen atorvastatin (LIPITOR) 20 MG tablet Take 20 mg by mouth daily.      . beclomethasone (QVAR) 80 MCG/ACT inhaler Inhale 2 puffs into the lungs 2 (two) times daily.      . Calcium Carbonate-Vitamin D (CALCIUM-VITAMIN D) 500-200 MG-UNIT per tablet  Take 1 tablet by mouth daily.        . diclofenac (VOLTAREN) 75 MG EC tablet Take 75 mg by mouth 2 (two) times daily.      . ferrous sulfate 325 (65 FE) MG tablet Take 325 mg by mouth 2 (two) times daily.      . furosemide (LASIX) 20 MG tablet Take 40 mg by mouth daily.       . hydrOXYzine (ATARAX/VISTARIL) 25 MG tablet Take 25 mg by mouth every 8 (eight) hours as needed. For itching.      Boris Lown Oil Omega-3 300 MG CAPS Take 1 tablet by mouth daily.      Marland Kitchen loratadine (CLARITIN) 10 MG tablet Take 20 mg by mouth daily.       Marland Kitchen LORazepam (ATIVAN) 0.5 MG tablet Take 0.5 mg by mouth daily as needed. For anxiety.      . meloxicam (MOBIC) 7.5 MG tablet Take 7.5 mg by mouth daily.      . mometasone (NASONEX) 50 MCG/ACT nasal spray Place 2 sprays into the nose daily.      . Multiple Vitamin (MULITIVITAMIN WITH MINERALS) TABS Take 1 tablet  by mouth daily.      . naproxen sodium (ANAPROX) 220 MG tablet Take 220 mg by mouth 2 (two) times daily with a meal.       . Olopatadine HCl 0.2 % SOLN Apply 1 drop to eye daily as needed. For dry eyes.      Marland Kitchen omeprazole (PRILOSEC) 20 MG capsule Take 20 mg by mouth 2 (two) times daily.       . valsartan-hydrochlorothiazide (DIOVAN-HCT) 160-12.5 MG per tablet Take 1 tablet by mouth daily.      . vitamin B-12 (CYANOCOBALAMIN) 1000 MCG tablet Take 1,000 mcg by mouth daily.      Marland Kitchen zolpidem (AMBIEN) 10 MG tablet Take 10 mg by mouth at bedtime as needed. For sleep.        (Not in a hospital admission)  No results found for this or any previous visit (from the past 48 hour(s)). No results found.  Review of Systems  Constitutional: Negative.   HENT: Negative.   Eyes: Negative.   Respiratory: Negative.   Cardiovascular: Negative.   Gastrointestinal: Negative.   Genitourinary: Negative.   Musculoskeletal: Positive for joint pain.       Left shoulder pain  Skin: Negative.   Neurological: Negative.   Endo/Heme/Allergies: Negative.   Psychiatric/Behavioral: Negative.     There were no vitals taken for this visit. Physical Exam  Constitutional: She is oriented to person, place, and time. She appears well-developed and well-nourished.  HENT:  Head: Normocephalic and atraumatic.  Mouth/Throat: Oropharynx is clear and moist.  Eyes: EOM are normal. Pupils are equal, round, and reactive to light.  Neck: Normal range of motion. Neck supple.  Cardiovascular: Normal rate, regular rhythm and normal heart sounds.   Respiratory: Effort normal and breath sounds normal.  GI: Soft.  Musculoskeletal:       Left shoulder reveals FF 160, ABD 160, 80 internal and external rotation.  2+ radial pulse  Neurological: She is alert and oriented to person, place, and time.  Skin: Skin is warm and dry.  Psychiatric: She has a normal mood and affect. Her behavior is normal.     Assessment Patient Active  Problem List  Diagnosis  . Hyperlipidemia  . Degenerative disc disease  . Sleep apnea  . Shoulder impingement  . Hypertension  . History of  TIA (transient ischemic attack)  . Partial tear of rotator cuff    Plan I talk to her about this in detail. I do think with her significant pain and lack of response to conservative care with findings on MRI that I'd recommend left shoulder arthroscopy with attention to her rotator cuff pathology with debridement versus repair. Discussed risks benefits and possible complications of the surgery in detail and she understands this completely. We'll plan on setting her up for this at some point in the near future. I also recommend that she see Dr. Timothy Lasso for her lower extremity edema and fluid retention.  Illyana Schorsch J 05/15/2012, 12:28 PM

## 2012-05-15 NOTE — Anesthesia Postprocedure Evaluation (Signed)
Anesthesia Post Note  Patient: [name]  Procedure(s) Performed: Procedure(s) (LRB): SHOULDER ARTHROSCOPY WITH ROTATOR CUFF REPAIR AND SUBACROMIAL DECOMPRESSION (Left)  Anesthesia type: General  Patient location: PACU  Post pain: Pain level controlled  Post assessment: Patient's Cardiovascular Status Stable  Last Vitals:  Filed Vitals:   05/15/12 1515  BP: 128/65  Pulse: 89  Temp:   Resp: 19    Post vital signs: Reviewed and stable  Level of consciousness: alert  Complications: No apparent anesthesia complications

## 2012-05-15 NOTE — Brief Op Note (Signed)
05/15/2012  1:40 PM  PATIENT:  [name]  51 y.o. female  PRE-OPERATIVE DIAGNOSIS:  left shoulder RTC tear , partial labrum tear, with impingement   POST-OPERATIVE DIAGNOSIS:  same as preop  PROCEDURE:  Procedure(s) (LRB) with comments: SHOULDER ARTHROSCOPY WITH ROTATOR CUFF REPAIR AND SUBACROMIAL DECOMPRESSION (Left) - left shoulder arthroscopy with decompresson subacromial partial acromioplasty with coracoacromial release with rotator cuff repair   SURGEON:  Surgeon(s) and Role:    * Nilda Simmer, MD - Primary  PHYSICIAN ASSISTANT: Julien Girt PA-C  ASSISTANTS: Kirstin Shepperson PA-C   ANESTHESIA:   general  EBL:  Total I/O In: 300 [I.V.:300] Out: -   BLOOD ADMINISTERED:none  DRAINS: none   LOCAL MEDICATIONS USED:  NONE  SPECIMEN:  No Specimen  DISPOSITION OF SPECIMEN:  N/A  COUNTS:  YES  TOURNIQUET:  * No tourniquets in log *  DICTATION: .Note written in EPIC  PLAN OF CARE: Admit for overnight observation  PATIENT DISPOSITION:  PACU - hemodynamically stable.   Delay start of Pharmacological VTE agent (>24hrs) due to surgical blood loss or risk of bleeding: no

## 2012-05-15 NOTE — Anesthesia Preprocedure Evaluation (Addendum)
Anesthesia Evaluation  Patient identified by MRN, date of birth, ID band Patient awake    Reviewed: Allergy & Precautions, H&P , NPO status , Patient's Chart, lab work & pertinent test results  Airway Mallampati: III  Neck ROM: full    Dental   Pulmonary asthma , sleep apnea , former smoker,          Cardiovascular hypertension,     Neuro/Psych  Headaches, TIA   GI/Hepatic GERD-  ,  Endo/Other  Morbid obesity  Renal/GU      Musculoskeletal   Abdominal   Peds  Hematology   Anesthesia Other Findings   Reproductive/Obstetrics                           Anesthesia Physical Anesthesia Plan  ASA: II  Anesthesia Plan: General and Regional   Post-op Pain Management: MAC Combined w/ Regional for Post-op pain   Induction: Intravenous  Airway Management Planned: Oral ETT  Additional Equipment:   Intra-op Plan:   Post-operative Plan: Extubation in OR  Informed Consent: I have reviewed the patients History and Physical, chart, labs and discussed the procedure including the risks, benefits and alternatives for the proposed anesthesia with the patient or authorized representative who has indicated his/her understanding and acceptance.     Plan Discussed with: CRNA and Surgeon  Anesthesia Plan Comments:         Anesthesia Quick Evaluation

## 2012-05-16 LAB — POCT HEMOGLOBIN-HEMACUE: Hemoglobin: 11.3 g/dL — ABNORMAL LOW (ref 12.0–15.0)

## 2012-05-17 NOTE — Op Note (Signed)
[name], IGNASIAK NO.:  [id number]  MEDICAL RECORD NO.:  [id number]  LOCATION:                                 FACILITY:  PHYSICIAN:  Elana Alm. Thurston Hole, [name] DATE OF BIRTH:  April 29, 1961  DATE OF PROCEDURE:  05/15/2012 DATE OF DISCHARGE:                              OPERATIVE REPORT   PREOPERATIVE DIAGNOSES: 1. Left shoulder rotator cuff tear. 2. Left shoulder partial labrum tear. 3. Left shoulder impingement.  POSTOPERATIVE DIAGNOSES: 1. Left shoulder rotator cuff tear. 2. Left shoulder partial labrum tear. 3. Left shoulder impingement.  PROCEDURES: 1. Left shoulder EUA followed by arthroscopically-assisted rotator     cuff repair using Arthrex fiber tape and swivel lock x1. 2. Left shoulder debridement, partial labrum tear. 3. Left shoulder subacromial decompression.  SURGEON:  Elana Alm. Thurston Hole, MD  ASSISTANT:  Julien Girt, PA-C  ANESTHESIA:  General.  OPERATIVE NOTE:  1 hour.  COMPLICATIONS:  None.  INDICATION FOR PROCEDURE:  [name] is a 51 year old woman who has had almost 2-year of left shoulder pain, increasing in nature with exam and MRI documenting a partial versus complete rotator cuff tear with partial labrum tear and impingement.  She has failed conservative care and is now to undergo arthroscopy and repair.  DESCRIPTION:  Ms. [name] was brought to the operating room on May 15, 2012, after an interscalene block was placed in the holding room by Anesthesia.  She was placed on the operating table in a supine position. She received Ancef 2 g IV preoperatively for prophylaxis.  After being placed under general anesthesia, her left shoulder was examined.  She had full range of motion and her shoulder was stable to ligamentous exam.  She was then placed in beach-chair position, and her shoulder and arm was prepped using sterile DuraPrep and draped using sterile technique.  Time-out procedure was called and the correct  left shoulder identified.  Initially, through a posterior arthroscopic portal, the arthroscope with a pump attached was placed into an anterior portal and arthroscopic probe was placed.  On initial inspection, the articular cartilage in the glenohumeral joint showed 25% grade 3 chondromalacia, which was debrided.  She had tearing of the anterior, superior, and posterior labrum 25-30%, which was debrided.  The anterior-inferior labrum and anterior-inferior glenohumeral ligament complex was intact. The biceps tendon anchor was slightly hypermobile, but it was otherwise well attached.  Biceps tendon showed partial tearing of 25%, which was debrided.  The rotator cuff on the articular surface showed a high-grade partial tear of the supraspinatus 50%, this was debrided.  The rest of the rotator cuff was intact.  The inferior capsular recess was free of pathology.  Subacromial space was entered and a lateral arthroscopic portal was made.  Moderately thickened bursitis was resected. Impingement was noted and a subacromial decompression was carried out removing 6-8 mm of the undersurface of the anterior, anterolateral and anteromedial acromion and CA ligament release carried out as well.  The [facility] joint was not pathologic and thus was not resected.  The rotator cuff was inspected from the bursal side, there was almost complete tear of the supraspinatus anteriorly.  This was partially debrided arthroscopically and then repaired  primarily through an accessory lateral portal with 1 Arthrex fiber tape and a swivel lock deployed into the lateral aspect of the greater tuberosity with firm and tight fixation.  This completely restored normal anatomy and excellent fixation to this tear.  The shoulder could be brought through full range of motion with no impingement on the repair.  At this point, it was felt that all pathology had been satisfactorily addressed.  The instruments were removed.  The portals  were closed with 3-0 nylon suture.  Sterile dressings and a sling applied.  The patient was awakened and taken to the recovery room in stable condition.  FOLLOWUP CARE:  [name] will be followed overnight at [facility] for IV pain control and oxygenation monitoring due to her severe sleep apnea.  She will be discharged tomorrow on Percocet and Robaxin with early physical therapy.  She will be seen back in my office in a week for sutures out and followup.     [name] A. Thurston Hole, [name]     RAW/MEDQ  D:  05/16/2012  T:  05/17/2012  Job:  [id number]

## 2012-09-14 ENCOUNTER — Emergency Department (HOSPITAL_COMMUNITY)
Admission: EM | Admit: 2012-09-14 | Discharge: 2012-09-14 | Disposition: A | Discharge status: Home / Self Care | Payer: 59 | Attending: Emergency Medicine | Admitting: Emergency Medicine

## 2012-09-14 ENCOUNTER — Emergency Department (INDEPENDENT_AMBULATORY_CARE_PROVIDER_SITE_OTHER)
Admission: EM | Admit: 2012-09-14 | Discharge: 2012-09-14 | Disposition: A | Discharge status: Other Acute Inpatient Hospital | Payer: 59 | Source: Home / Self Care | Attending: Emergency Medicine | Admitting: Emergency Medicine

## 2012-09-14 ENCOUNTER — Encounter (HOSPITAL_COMMUNITY): Payer: Self-pay

## 2012-09-14 DIAGNOSIS — IMO0002 Reserved for concepts with insufficient information to code with codable children: Secondary | ICD-10-CM | POA: Insufficient documentation

## 2012-09-14 DIAGNOSIS — D509 Iron deficiency anemia, unspecified: Secondary | ICD-10-CM | POA: Insufficient documentation

## 2012-09-14 DIAGNOSIS — Z87828 Personal history of other (healed) physical injury and trauma: Secondary | ICD-10-CM | POA: Insufficient documentation

## 2012-09-14 DIAGNOSIS — R51 Headache: Secondary | ICD-10-CM | POA: Insufficient documentation

## 2012-09-14 DIAGNOSIS — Z79899 Other long term (current) drug therapy: Secondary | ICD-10-CM | POA: Insufficient documentation

## 2012-09-14 DIAGNOSIS — I10 Essential (primary) hypertension: Secondary | ICD-10-CM | POA: Insufficient documentation

## 2012-09-14 DIAGNOSIS — Z87891 Personal history of nicotine dependence: Secondary | ICD-10-CM | POA: Insufficient documentation

## 2012-09-14 DIAGNOSIS — J45909 Unspecified asthma, uncomplicated: Secondary | ICD-10-CM | POA: Insufficient documentation

## 2012-09-14 DIAGNOSIS — H539 Unspecified visual disturbance: Secondary | ICD-10-CM

## 2012-09-14 DIAGNOSIS — G4730 Sleep apnea, unspecified: Secondary | ICD-10-CM | POA: Insufficient documentation

## 2012-09-14 DIAGNOSIS — Z87440 Personal history of urinary (tract) infections: Secondary | ICD-10-CM | POA: Insufficient documentation

## 2012-09-14 DIAGNOSIS — E785 Hyperlipidemia, unspecified: Secondary | ICD-10-CM | POA: Insufficient documentation

## 2012-09-14 DIAGNOSIS — K219 Gastro-esophageal reflux disease without esophagitis: Secondary | ICD-10-CM | POA: Insufficient documentation

## 2012-09-14 DIAGNOSIS — Z8673 Personal history of transient ischemic attack (TIA), and cerebral infarction without residual deficits: Secondary | ICD-10-CM | POA: Insufficient documentation

## 2012-09-14 MED ORDER — DIPHENHYDRAMINE HCL 50 MG/ML IJ SOLN
25.0000 mg | Freq: Once | INTRAMUSCULAR | Status: AC
  Administered 2012-09-14: 25 mg via INTRAVENOUS
  Filled 2012-09-14: qty 1

## 2012-09-14 MED ORDER — METOCLOPRAMIDE HCL 5 MG/ML IJ SOLN
10.0000 mg | Freq: Once | INTRAMUSCULAR | Status: AC
  Administered 2012-09-14: 10 mg via INTRAVENOUS
  Filled 2012-09-14: qty 2

## 2012-09-14 MED ORDER — SODIUM CHLORIDE 0.9 % IV BOLUS (SEPSIS)
1000.0000 mL | Freq: Once | INTRAVENOUS | Status: AC
  Administered 2012-09-14: 1000 mL via INTRAVENOUS

## 2012-09-14 NOTE — ED Notes (Signed)
C/o facial pain and pressure since last week, no relief in spite of prednisone tx rx by her MD, Dr Timothy Lasso. Office unable to see today, and reccommended she come to [facility]; son is reportedly in car in parking lot; C/o pain 5-6 w occasional stabbing sensation in her head

## 2012-09-14 NOTE — ED Provider Notes (Signed)
52 year old female comes in with several day history of a bifrontal headache which has not responded to OTC medications. In the ED, her neurologic exam is normal and headache is significantly improved with IV fluids and IV metoclopramide. She'll be discharged and referred back to her PCP  I saw and evaluated the patient, reviewed the resident's note and I agree with the findings and plan.   Dione Booze, MD 09/14/12 2110

## 2012-09-14 NOTE — ED Provider Notes (Signed)
History     CSN: [id number]  Arrival date & time 09/14/12  1724   None     Chief Complaint  Patient presents with  . Headache    HPI chief complaint: Headache. Onset: 4 days ago. Location: Head. Symptoms not improved or worsened by anything. Quality: Dull. Throat: Mild to moderate. Timing constant. Context: Identical to past migraines. No associated vision changes, hearing loss, fever, neck pain, neck stiffness, vomiting, confusion, weakness, paresthesias. Regarding social history see nurse's notes. No family history recent febrile or viral illnesses. I have reviewed patient's past medical, past surgical, social history as well as medications and allergies.  Past Medical History  Diagnosis Date  . Hyperlipidemia   . GERD (gastroesophageal reflux disease)   . Headache     migraine-like  . Degenerative disc disease   . History of UTI   . Sleep apnea     uses CPAP nightly  . Shoulder impingement 04/2012    left  . Asthma     daily and prn inhalers  . Hypertension     under control, has been on med. x 4-5 yrs.  . Anemia     takes iron supplement  . History of TIA (transient ischemic attack) early 2013    no weakness or deficits  . Partial tear of rotator cuff     Past Surgical History  Procedure Date  . Umbilical hernia repair 03/28/2002  . Dilation and curettage of uterus     2000  . Anterior cervical decomp/discectomy fusion 02/18/2010    C6-7  . Total knee arthroplasty 09/07/2009    left  . Total knee arthroplasty 10/13/2008    right  . Abdominal hysterectomy 03/28/2002    complete    Family History  Problem Relation Age of Onset  . Diabetes Mother   . Kidney disease Father   . Breast cancer Sister   . Diabetes Sister     History  Substance Use Topics  . Smoking status: Former Smoker    Quit date: 10/02/1999  . Smokeless tobacco: Never Used  . Alcohol Use: No    OB History    Grav Para Term Preterm Abortions TAB SAB Ect Mult Living                   Review of Systems 10 Systems reviewed and are negative for acute change except as noted in the HPI.  Allergies  Review of patient's allergies indicates no known allergies.  Home Medications   Current Outpatient Rx  Name  Route  Sig  Dispense  Refill  . RISAQUAD PO CAPS   Oral   Take 1 capsule by mouth every morning.          . ALBUTEROL SULFATE HFA 108 (90 BASE) MCG/ACT IN AERS   Inhalation   Inhale 2 puffs into the lungs 2 (two) times daily. For wheezing.         Marland Kitchen AMOXICILLIN 500 MG PO CAPS   Oral   Take 1,000 mg by mouth once.         . ATORVASTATIN CALCIUM 20 MG PO TABS   Oral   Take 20 mg by mouth every evening.          . BECLOMETHASONE DIPROPIONATE 80 MCG/ACT IN AERS   Inhalation   Inhale 2 puffs into the lungs 2 (two) times daily.         Marland Kitchen CALCIUM-VITAMIN D 500-200 MG-UNIT PO TABS   Oral   Take  1 tablet by mouth daily.           Marland Kitchen FERROUS SULFATE 325 (65 FE) MG PO TABS   Oral   Take 325 mg by mouth 2 (two) times daily.         . FUROSEMIDE 20 MG PO TABS   Oral   Take 20 mg by mouth daily.          Marland Kitchen HYDROXYZINE HCL 25 MG PO TABS   Oral   Take 25 mg by mouth every 8 (eight) hours as needed. For itching.         Marland Kitchen KRILL OIL OMEGA-3 300 MG PO CAPS   Oral   Take 1 tablet by mouth daily.         Marland Kitchen LORATADINE 10 MG PO TABS   Oral   Take 20 mg by mouth daily.          Marland Kitchen LORAZEPAM 0.5 MG PO TABS   Oral   Take 0.5 mg by mouth at bedtime. For anxiety.         . MOMETASONE FUROATE 50 MCG/ACT NA SUSP   Nasal   Place 1 spray into the nose daily.          . ADULT MULTIVITAMIN W/MINERALS CH   Oral   Take 1 tablet by mouth daily.         . OLOPATADINE HCL 0.2 % OP SOLN   Ophthalmic   Apply 1 drop to eye daily as needed. For dry eyes.         . OMEPRAZOLE 20 MG PO CPDR   Oral   Take 20 mg by mouth 2 (two) times daily.          Marland Kitchen VALSARTAN-HYDROCHLOROTHIAZIDE 160-12.5 MG PO TABS   Oral   Take 1 tablet by mouth  daily.         Marland Kitchen VITAMIN B-12 1000 MCG PO TABS   Oral   Take 1,000 mcg by mouth daily.         Marland Kitchen ZOLPIDEM TARTRATE 10 MG PO TABS   Oral   Take 10 mg by mouth at bedtime as needed. For sleep.           BP 137/64  Pulse 83  Temp 98.4 F (36.9 C) (Oral)  Resp 18  Ht 5\' 1"  (1.549 m)  Wt 283 lb (128.368 kg)  BMI 53.47 kg/m2  SpO2 99%  Physical Exam  Constitutional: She is oriented to person, place, and time. She appears well-developed and well-nourished. No distress.  HENT:  Head: Normocephalic and atraumatic.  Eyes: Conjunctivae normal are normal. Right eye exhibits no discharge. Left eye exhibits no discharge. No scleral icterus.  Neck: Normal range of motion. Neck supple.  Cardiovascular: Normal rate, regular rhythm, normal heart sounds and intact distal pulses.   No murmur heard. Pulmonary/Chest: Effort normal and breath sounds normal. No respiratory distress.  Abdominal: Soft. Bowel sounds are normal. She exhibits no distension. There is no tenderness.  Musculoskeletal: Normal range of motion. She exhibits no tenderness.  Neurological: She is alert and oriented to person, place, and time. She has normal strength. No cranial nerve deficit or sensory deficit. Coordination normal. GCS eye subscore is 4. GCS verbal subscore is 5. GCS motor subscore is 6.  Skin: Skin is warm and dry. She is not diaphoretic.  Psychiatric: She has a normal mood and affect.    ED Course  Procedures (including critical care time)  Labs Reviewed - No  data to display No results found.   1. Headache       MDM  Patient is a well-appearing 52 year old female past medical history migraines presenting today with 4 days of headache identical to past. Well-appearing. Vital stable. Afebrile. No concern for meningitis or encephalitis. No focal neurologic deficits. Headache resolved with migraine cocktail. Discharged home with PCP followup and return precautions.        Consuello Masse,  MD 09/15/12 [phone]

## 2012-09-14 NOTE — ED Notes (Addendum)
Pt. Sent to [place] from urgent care for a headache X 7 days.   No neuro deficits.  Pt. Also having blurred vision

## 2012-09-16 NOTE — ED Provider Notes (Signed)
History     CSN: [id number]  Arrival date & time 09/14/12  1545   First MD Initiated Contact with Patient 09/14/12 1633      Chief Complaint  Patient presents with  . Headache    (Consider location/radiation/quality/duration/timing/severity/associated sxs/prior treatment) HPI Comments: Patient presents urgent care describing she's been having increased headache for the last week. She has had several headaches in the past and a one point was evaluated by neurology where they concluded that she might have had a " silent stroke". She has been seen by her Dr. and was taking prednisone for the headache but they don't seem to be helping. The pain has actually increase and she's having now some visual changes that she describes as " difficulty focusing ". Patient denies any fevers, nausea vomiting or paresthesias or muscular weakness.    Patient is a 52 y.o. female presenting with headaches. The history is provided by the patient.  Headache The primary symptoms include headaches, dizziness and visual change. Primary symptoms do not include syncope, loss of consciousness, focal weakness, loss of sensation, speech change, memory loss, fever, nausea or vomiting. The symptoms began more than 1 week ago. The symptoms are worsening.  The headache is associated with visual change. The headache is not associated with photophobia, neck stiffness or weakness.  Dizziness does not occur with nausea, vomiting or weakness.  The visual change does not include photophobia.  Additional symptoms do not include neck stiffness, weakness, pain, leg pain or photophobia. Workup history includes cardiac workup.    Past Medical History  Diagnosis Date  . Hyperlipidemia   . GERD (gastroesophageal reflux disease)   . Headache     migraine-like  . Degenerative disc disease   . History of UTI   . Sleep apnea     uses CPAP nightly  . Shoulder impingement 04/2012    left  . Asthma     daily and prn inhalers  .  Hypertension     under control, has been on med. x 4-5 yrs.  . Anemia     takes iron supplement  . History of TIA (transient ischemic attack) early 2013    no weakness or deficits  . Partial tear of rotator cuff     Past Surgical History  Procedure Date  . Umbilical hernia repair 03/28/2002  . Dilation and curettage of uterus     2000  . Anterior cervical decomp/discectomy fusion 02/18/2010    C6-7  . Total knee arthroplasty 09/07/2009    left  . Total knee arthroplasty 10/13/2008    right  . Abdominal hysterectomy 03/28/2002    complete    Family History  Problem Relation Age of Onset  . Diabetes Mother   . Kidney disease Father   . Breast cancer Sister   . Diabetes Sister     History  Substance Use Topics  . Smoking status: Former Smoker    Quit date: 10/02/1999  . Smokeless tobacco: Never Used  . Alcohol Use: No    OB History    Grav Para Term Preterm Abortions TAB SAB Ect Mult Living                  Review of Systems  Constitutional: Negative for fever.  HENT: Negative for neck stiffness.   Eyes: Negative for photophobia.  Cardiovascular: Negative for syncope.  Gastrointestinal: Negative for nausea and vomiting.  Neurological: Positive for dizziness and headaches. Negative for speech change, focal weakness, loss of consciousness and  weakness.  Psychiatric/Behavioral: Negative for memory loss.    Allergies  Review of patient's allergies indicates no known allergies.  Home Medications   Current Outpatient Rx  Name  Route  Sig  Dispense  Refill  . RISAQUAD PO CAPS   Oral   Take 1 capsule by mouth every morning.          . ALBUTEROL SULFATE HFA 108 (90 BASE) MCG/ACT IN AERS   Inhalation   Inhale 2 puffs into the lungs 2 (two) times daily. For wheezing.         Marland Kitchen AMOXICILLIN 500 MG PO CAPS   Oral   Take 1,000 mg by mouth once.         . ATORVASTATIN CALCIUM 20 MG PO TABS   Oral   Take 20 mg by mouth every evening.          .  BECLOMETHASONE DIPROPIONATE 80 MCG/ACT IN AERS   Inhalation   Inhale 2 puffs into the lungs 2 (two) times daily.         Marland Kitchen CALCIUM-VITAMIN D 500-200 MG-UNIT PO TABS   Oral   Take 1 tablet by mouth daily.           Marland Kitchen FERROUS SULFATE 325 (65 FE) MG PO TABS   Oral   Take 325 mg by mouth 2 (two) times daily.         . FUROSEMIDE 20 MG PO TABS   Oral   Take 20 mg by mouth daily.          Marland Kitchen HYDROXYZINE HCL 25 MG PO TABS   Oral   Take 25 mg by mouth every 8 (eight) hours as needed. For itching.         Marland Kitchen KRILL OIL OMEGA-3 300 MG PO CAPS   Oral   Take 1 tablet by mouth daily.         Marland Kitchen LORATADINE 10 MG PO TABS   Oral   Take 20 mg by mouth daily.          Marland Kitchen LORAZEPAM 0.5 MG PO TABS   Oral   Take 0.5 mg by mouth at bedtime. For anxiety.         . MOMETASONE FUROATE 50 MCG/ACT NA SUSP   Nasal   Place 1 spray into the nose daily.          . ADULT MULTIVITAMIN W/MINERALS CH   Oral   Take 1 tablet by mouth daily.         . OLOPATADINE HCL 0.2 % OP SOLN   Ophthalmic   Apply 1 drop to eye daily as needed. For dry eyes.         . OMEPRAZOLE 20 MG PO CPDR   Oral   Take 20 mg by mouth 2 (two) times daily.          Marland Kitchen VALSARTAN-HYDROCHLOROTHIAZIDE 160-12.5 MG PO TABS   Oral   Take 1 tablet by mouth daily.         Marland Kitchen VITAMIN B-12 1000 MCG PO TABS   Oral   Take 1,000 mcg by mouth daily.         Marland Kitchen ZOLPIDEM TARTRATE 10 MG PO TABS   Oral   Take 10 mg by mouth at bedtime as needed. For sleep.           There were no vitals taken for this visit.  Physical Exam  Vitals reviewed. Constitutional: She is oriented to person, place, and  time. She appears well-developed and well-nourished.  Neurological: She is alert and oriented to person, place, and time. She is not disoriented. She displays normal reflexes. No cranial nerve deficit or sensory deficit. She exhibits normal muscle tone. Coordination normal.  Skin: Skin is warm. No rash noted. No erythema.     ED Course  Procedures (including critical care time)  Labs Reviewed - No data to display No results found.   1. Headache   2. Visual changes       MDM  Recurrent headaches. Now has been symptomatic for about 5 weeks. With visual changes as patient reports. Patient has been transferred to the emergency department.        Jimmie Molly, MD 09/16/12 [phone]

## 2012-09-19 ENCOUNTER — Other Ambulatory Visit: Payer: Self-pay | Admitting: Internal Medicine

## 2012-09-19 DIAGNOSIS — J310 Chronic rhinitis: Secondary | ICD-10-CM

## 2012-09-21 ENCOUNTER — Ambulatory Visit
Admission: RE | Admit: 2012-09-21 | Discharge: 2012-09-21 | Disposition: A | Discharge status: Home / Self Care | Payer: 59 | Source: Ambulatory Visit | Attending: Internal Medicine | Admitting: Internal Medicine

## 2012-09-21 DIAGNOSIS — J310 Chronic rhinitis: Secondary | ICD-10-CM

## 2013-03-11 ENCOUNTER — Observation Stay (HOSPITAL_COMMUNITY)
Admission: EM | Admit: 2013-03-11 | Discharge: 2013-03-13 | Disposition: A | Discharge status: Home / Self Care | Payer: 59 | Attending: Cardiology | Admitting: Cardiology

## 2013-03-11 ENCOUNTER — Emergency Department (HOSPITAL_COMMUNITY): Payer: 59

## 2013-03-11 ENCOUNTER — Encounter (HOSPITAL_COMMUNITY): Payer: Self-pay | Admitting: Emergency Medicine

## 2013-03-11 DIAGNOSIS — M47812 Spondylosis without myelopathy or radiculopathy, cervical region: Secondary | ICD-10-CM | POA: Diagnosis present

## 2013-03-11 DIAGNOSIS — I10 Essential (primary) hypertension: Secondary | ICD-10-CM

## 2013-03-11 DIAGNOSIS — E669 Obesity, unspecified: Secondary | ICD-10-CM | POA: Diagnosis present

## 2013-03-11 DIAGNOSIS — G4733 Obstructive sleep apnea (adult) (pediatric): Secondary | ICD-10-CM | POA: Diagnosis present

## 2013-03-11 DIAGNOSIS — R9431 Abnormal electrocardiogram [ECG] [EKG]: Secondary | ICD-10-CM | POA: Insufficient documentation

## 2013-03-11 DIAGNOSIS — R000 Tachycardia, unspecified: Secondary | ICD-10-CM | POA: Insufficient documentation

## 2013-03-11 DIAGNOSIS — M79609 Pain in unspecified limb: Secondary | ICD-10-CM | POA: Insufficient documentation

## 2013-03-11 DIAGNOSIS — G4730 Sleep apnea, unspecified: Secondary | ICD-10-CM

## 2013-03-11 DIAGNOSIS — R42 Dizziness and giddiness: Secondary | ICD-10-CM | POA: Insufficient documentation

## 2013-03-11 DIAGNOSIS — R079 Chest pain, unspecified: Principal | ICD-10-CM

## 2013-03-11 DIAGNOSIS — E7800 Pure hypercholesterolemia, unspecified: Secondary | ICD-10-CM | POA: Diagnosis present

## 2013-03-11 DIAGNOSIS — Z8249 Family history of ischemic heart disease and other diseases of the circulatory system: Secondary | ICD-10-CM

## 2013-03-11 HISTORY — DX: Chest pain, unspecified: R07.9

## 2013-03-11 LAB — POCT I-STAT TROPONIN I
Troponin i, poc: 0.01 ng/mL (ref 0.00–0.08)
Troponin i, poc: 0.02 ng/mL (ref 0.00–0.08)

## 2013-03-11 LAB — POCT I-STAT, CHEM 8
BUN: 12 mg/dL (ref 6–23)
Calcium, Ion: 1.22 mmol/L (ref 1.12–1.23)
Chloride: 100 mEq/L (ref 96–112)
Creatinine, Ser: 0.9 mg/dL (ref 0.50–1.10)
Glucose, Bld: 183 mg/dL — ABNORMAL HIGH (ref 70–99)
HCT: 41 % (ref 36.0–46.0)
Hemoglobin: 13.9 g/dL (ref 12.0–15.0)
Potassium: 3.3 mEq/L — ABNORMAL LOW (ref 3.5–5.1)
Sodium: 139 mEq/L (ref 135–145)
TCO2: 26 mmol/L (ref 0–100)

## 2013-03-11 LAB — TROPONIN I: Troponin I: <0.3 ng/mL (ref <0.30)

## 2013-03-11 LAB — CBC WITH DIFFERENTIAL/PLATELET
Basophils Absolute: 0 10*3/uL (ref 0.0–0.1)
Basophils Relative: 0 % (ref 0–1)
Eosinophils Absolute: 0.2 10*3/uL (ref 0.0–0.7)
Eosinophils Relative: 2 % (ref 0–5)
HCT: 38.3 % (ref 36.0–46.0)
Hemoglobin: 12.7 g/dL (ref 12.0–15.0)
Lymphocytes Relative: 20 % (ref 12–46)
Lymphs Abs: 2.2 10*3/uL (ref 0.7–4.0)
MCH: 26.7 pg (ref 26.0–34.0)
MCHC: 33.2 g/dL (ref 30.0–36.0)
MCV: 80.5 fL (ref 78.0–100.0)
Monocytes Absolute: 0.6 10*3/uL (ref 0.1–1.0)
Monocytes Relative: 5 % (ref 3–12)
Neutro Abs: 7.9 10*3/uL — ABNORMAL HIGH (ref 1.7–7.7)
Neutrophils Relative %: 73 % (ref 43–77)
Platelets: 286 10*3/uL (ref 150–400)
RBC: 4.76 MIL/uL (ref 3.87–5.11)
RDW: 16 % — ABNORMAL HIGH (ref 11.5–15.5)
WBC: 10.9 10*3/uL — ABNORMAL HIGH (ref 4.0–10.5)

## 2013-03-11 LAB — CREATININE, SERUM
Creatinine, Ser: 0.78 mg/dL (ref 0.50–1.10)
GFR calc Af Amer: >90 mL/min (ref >90)
GFR calc non Af Amer: >90 mL/min (ref >90)

## 2013-03-11 LAB — CBC
HCT: 37.4 % (ref 36.0–46.0)
Hemoglobin: 12.3 g/dL (ref 12.0–15.0)
MCH: 26.6 pg (ref 26.0–34.0)
MCHC: 32.9 g/dL (ref 30.0–36.0)
MCV: 81 fL (ref 78.0–100.0)
Platelets: 283 10*3/uL (ref 150–400)
RBC: 4.62 MIL/uL (ref 3.87–5.11)
RDW: 16.1 % — ABNORMAL HIGH (ref 11.5–15.5)
WBC: 11.9 10*3/uL — ABNORMAL HIGH (ref 4.0–10.5)

## 2013-03-11 MED ORDER — HYDROCHLOROTHIAZIDE 12.5 MG PO CAPS
12.5000 mg | ORAL_CAPSULE | Freq: Every day | ORAL | Status: DC
  Administered 2013-03-12 – 2013-03-13 (×2): 12.5 mg via ORAL
  Filled 2013-03-11 (×2): qty 1

## 2013-03-11 MED ORDER — ATORVASTATIN CALCIUM 20 MG PO TABS
20.0000 mg | ORAL_TABLET | Freq: Every evening | ORAL | Status: DC
  Administered 2013-03-11 – 2013-03-12 (×2): 20 mg via ORAL
  Filled 2013-03-11 (×3): qty 1

## 2013-03-11 MED ORDER — TOPIRAMATE 100 MG PO TABS
100.0000 mg | ORAL_TABLET | Freq: Every day | ORAL | Status: DC
  Administered 2013-03-11 – 2013-03-12 (×2): 100 mg via ORAL
  Filled 2013-03-11 (×3): qty 1

## 2013-03-11 MED ORDER — ASPIRIN EC 81 MG PO TBEC
81.0000 mg | DELAYED_RELEASE_TABLET | Freq: Every day | ORAL | Status: DC
  Administered 2013-03-12 – 2013-03-13 (×2): 81 mg via ORAL
  Filled 2013-03-11 (×2): qty 1

## 2013-03-11 MED ORDER — REGADENOSON 0.4 MG/5ML IV SOLN
0.4000 mg | Freq: Once | INTRAVENOUS | Status: AC
  Filled 2013-03-11: qty 5

## 2013-03-11 MED ORDER — NITROGLYCERIN 0.4 MG SL SUBL
0.4000 mg | SUBLINGUAL_TABLET | SUBLINGUAL | Status: AC | PRN
  Administered 2013-03-11 (×3): 0.4 mg via SUBLINGUAL
  Filled 2013-03-11: qty 25

## 2013-03-11 MED ORDER — CALCIUM CARBONATE-VITAMIN D 500-200 MG-UNIT PO TABS
1.0000 | ORAL_TABLET | Freq: Every day | ORAL | Status: DC
  Administered 2013-03-13: 1 via ORAL
  Filled 2013-03-11 (×3): qty 1

## 2013-03-11 MED ORDER — RISAQUAD PO CAPS
1.0000 | ORAL_CAPSULE | Freq: Every morning | ORAL | Status: DC
  Administered 2013-03-12 – 2013-03-13 (×2): 1 via ORAL
  Filled 2013-03-11 (×2): qty 1

## 2013-03-11 MED ORDER — LORAZEPAM 0.5 MG PO TABS: 0.5000 mg | ORAL_TABLET | Freq: Three times a day (TID) | ORAL | Status: DC | PRN

## 2013-03-11 MED ORDER — FLUTICASONE PROPIONATE 50 MCG/ACT NA SUSP
1.0000 | Freq: Every day | NASAL | Status: DC
  Administered 2013-03-13: 1 via NASAL
  Filled 2013-03-11: qty 16

## 2013-03-11 MED ORDER — ASPIRIN 325 MG PO TABS
325.0000 mg | ORAL_TABLET | Freq: Every day | ORAL | Status: DC
  Administered 2013-03-11: 325 mg via ORAL
  Filled 2013-03-11: qty 1

## 2013-03-11 MED ORDER — ASPIRIN 81 MG PO CHEW: 324.0000 mg | CHEWABLE_TABLET | ORAL | Status: AC

## 2013-03-11 MED ORDER — VALSARTAN-HYDROCHLOROTHIAZIDE 160-12.5 MG PO TABS: 1.0000 | ORAL_TABLET | Freq: Every day | ORAL | Status: DC

## 2013-03-11 MED ORDER — IRBESARTAN 150 MG PO TABS
150.0000 mg | ORAL_TABLET | Freq: Every day | ORAL | Status: DC
  Administered 2013-03-12 – 2013-03-13 (×2): 150 mg via ORAL
  Filled 2013-03-11 (×2): qty 1

## 2013-03-11 MED ORDER — PANTOPRAZOLE SODIUM 40 MG PO TBEC
40.0000 mg | DELAYED_RELEASE_TABLET | Freq: Once | ORAL | Status: AC
  Administered 2013-03-11: 40 mg via ORAL

## 2013-03-11 MED ORDER — CALCIUM-VITAMIN D 500-200 MG-UNIT PO TABS: 1.0000 | ORAL_TABLET | Freq: Every day | ORAL | Status: DC

## 2013-03-11 MED ORDER — CYCLOBENZAPRINE HCL 10 MG PO TABS
10.0000 mg | ORAL_TABLET | Freq: Three times a day (TID) | ORAL | Status: DC | PRN
  Filled 2013-03-11: qty 1

## 2013-03-11 MED ORDER — ONDANSETRON HCL 4 MG/2ML IJ SOLN: 4.0000 mg | Freq: Four times a day (QID) | INTRAMUSCULAR | Status: DC | PRN

## 2013-03-11 MED ORDER — ZOLPIDEM TARTRATE 5 MG PO TABS: 5.0000 mg | ORAL_TABLET | Freq: Every evening | ORAL | Status: DC | PRN

## 2013-03-11 MED ORDER — FUROSEMIDE 20 MG PO TABS
20.0000 mg | ORAL_TABLET | Freq: Every day | ORAL | Status: DC
  Administered 2013-03-12 – 2013-03-13 (×2): 20 mg via ORAL
  Filled 2013-03-11 (×2): qty 1

## 2013-03-11 MED ORDER — ACETAMINOPHEN 325 MG PO TABS
650.0000 mg | ORAL_TABLET | ORAL | Status: DC | PRN
  Administered 2013-03-12 (×2): 650 mg via ORAL
  Filled 2013-03-11 (×2): qty 2

## 2013-03-11 MED ORDER — HEPARIN SODIUM (PORCINE) 5000 UNIT/ML IJ SOLN
5000.0000 [IU] | Freq: Three times a day (TID) | INTRAMUSCULAR | Status: DC
  Administered 2013-03-11: 5000 [IU] via SUBCUTANEOUS
  Filled 2013-03-11 (×8): qty 1

## 2013-03-11 MED ORDER — PANTOPRAZOLE SODIUM 40 MG PO TBEC
40.0000 mg | DELAYED_RELEASE_TABLET | Freq: Every day | ORAL | Status: DC
  Administered 2013-03-12 – 2013-03-13 (×2): 40 mg via ORAL
  Filled 2013-03-11: qty 1

## 2013-03-11 MED ORDER — ALBUTEROL SULFATE HFA 108 (90 BASE) MCG/ACT IN AERS: 2.0000 | INHALATION_SPRAY | Freq: Four times a day (QID) | RESPIRATORY_TRACT | Status: DC | PRN

## 2013-03-11 MED ORDER — NITROGLYCERIN 0.4 MG SL SUBL: 0.4000 mg | SUBLINGUAL_TABLET | SUBLINGUAL | Status: DC | PRN

## 2013-03-11 MED ORDER — DICLOFENAC SODIUM 75 MG PO TBEC
75.0000 mg | DELAYED_RELEASE_TABLET | Freq: Two times a day (BID) | ORAL | Status: DC
  Administered 2013-03-12 – 2013-03-13 (×2): 75 mg via ORAL
  Filled 2013-03-11 (×5): qty 1

## 2013-03-11 MED ORDER — ZOLPIDEM TARTRATE 5 MG PO TABS: 10.0000 mg | ORAL_TABLET | Freq: Every evening | ORAL | Status: DC | PRN

## 2013-03-11 MED ORDER — FERROUS SULFATE 325 (65 FE) MG PO TABS
325.0000 mg | ORAL_TABLET | Freq: Two times a day (BID) | ORAL | Status: DC
  Administered 2013-03-11 – 2013-03-13 (×4): 325 mg via ORAL
  Filled 2013-03-11 (×5): qty 1

## 2013-03-11 MED ORDER — ASPIRIN 300 MG RE SUPP
300.0000 mg | RECTAL | Status: AC
  Filled 2013-03-11: qty 1

## 2013-03-11 MED ORDER — FLUTICASONE PROPIONATE HFA 44 MCG/ACT IN AERO
1.0000 | INHALATION_SPRAY | Freq: Two times a day (BID) | RESPIRATORY_TRACT | Status: DC
  Administered 2013-03-12 – 2013-03-13 (×2): 1 via RESPIRATORY_TRACT
  Filled 2013-03-11: qty 10.6

## 2013-03-11 NOTE — H&P (Signed)
ADMISSION HISTORY & PHYSICAL  Chief Complaint: Chest pain  Cardiologist: Croitoru  Primary Care Physician: Gwen Pounds, MD  HPI:  This is a 52 y.o. female with a past medical history significant for obesity and OSA. She's had a relatively full cardiac evaluation roughly a year ago by Dr. Royann Shivers for preoperative evaluation for rotator cuff surgery. She was doing relatively well until this morning when she woke up and noticed the sharp pains across the right center of her chest yesterday. Today she had similar to symptoms along with the left side of her chest that went up toward her left shoulder and then also noted pain in her left arm. This concerned her husband so they decided to come to the emergency room for evaluation. In the emergency room she was given some nitroglycerin which made her relax but if her pain. She doesn't the pain is made worse with deep inspiration and coughing as well certain movements. She also notes is worse with walking around but not significantly worse. She doesn't having significant shortness of breath associated with it. She did have a little nausea with it this morning but the no lightheadedness dizziness. No PND, orthopnea or edema. No melena, hematochezia hematuria. No TIA or amaurosis fugax in this. She has baseline edema there is relatively well-controlled with her Lasix.  PMHx:  Past Medical History  Diagnosis Date  . Hyperlipidemia   . GERD (gastroesophageal reflux disease)   . Headache(784.0)     migraine-like  . Degenerative disc disease   . History of UTI   . Sleep apnea     uses CPAP nightly  . Shoulder impingement 04/2012    left  . Asthma     daily and prn inhalers  . Hypertension     under control, has been on med. x 4-5 yrs.  . Anemia     takes iron supplement  . History of TIA (transient ischemic attack) early 2013    no weakness or deficits  . Partial tear of rotator cuff(726.13)     Past Surgical History  Procedure Laterality  Date  . Umbilical hernia repair  03/28/2002  . Dilation and curettage of uterus      2000  . Anterior cervical decomp/discectomy fusion  02/18/2010    C6-7  . Total knee arthroplasty  09/07/2009    left  . Total knee arthroplasty  10/13/2008    right  . Abdominal hysterectomy  03/28/2002    complete    FAMHx:  Family History  Problem Relation Age of Onset  . Diabetes Mother   . Kidney disease Father   . Breast cancer Sister   . Diabetes Sister     SOCHx:   reports that she quit smoking about 13 years ago. She has never used smokeless tobacco. She reports that she does not drink alcohol or use illicit drugs.  ALLERGIES:  Allergies  Allergen Reactions  . Claritin (Loratadine)    ROS: A comprehensive review of systems was negative except for: Pertinent positive above.  HOME MEDS: Current Facility-Administered Medications  Medication Dose Route Frequency Provider Last Rate Last Dose  . aspirin tablet 325 mg  325 mg Oral Daily Garlon Hatchet, PA-C   325 mg at 03/11/13 1415   Current Outpatient Prescriptions  Medication Sig Dispense Refill  . acidophilus (RISAQUAD) CAPS Take 1 capsule by mouth every morning.       Marland Kitchen albuterol (PROVENTIL HFA;VENTOLIN HFA) 108 (90 BASE) MCG/ACT inhaler Inhale 2 puffs into the lungs  every 6 (six) hours as needed for wheezing or shortness of breath.       Marland Kitchen atorvastatin (LIPITOR) 20 MG tablet Take 20 mg by mouth every evening.       . baclofen (LIORESAL) 10 MG tablet Take 10 mg by mouth 2 (two) times a week. No specific days      . beclomethasone (QVAR) 80 MCG/ACT inhaler Inhale 2 puffs into the lungs 2 (two) times daily.      . Calcium Carbonate-Vitamin D (CALCIUM-VITAMIN D) 500-200 MG-UNIT per tablet Take 1 tablet by mouth daily.        . ciclesonide (OMNARIS) 50 MCG/ACT nasal spray Place 2 sprays into both nostrils daily.      . cyclobenzaprine (FLEXERIL) 10 MG tablet Take 10 mg by mouth 3 (three) times daily as needed for muscle spasms.      .  diclofenac (VOLTAREN) 75 MG EC tablet Take 75 mg by mouth 2 (two) times daily.      . diclofenac sodium (VOLTAREN) 1 % GEL Apply 2 g topically 4 (four) times daily as needed (applies to wrists).      . ferrous sulfate 325 (65 FE) MG tablet Take 325 mg by mouth 2 (two) times daily.      . furosemide (LASIX) 20 MG tablet Take 20 mg by mouth daily.       . hydrOXYzine (ATARAX/VISTARIL) 25 MG tablet Take 25 mg by mouth every 8 (eight) hours as needed. For itching.      Boris Lown Oil Omega-3 300 MG CAPS Take 1 tablet by mouth daily.      Marland Kitchen loratadine (CLARITIN) 10 MG tablet Take 20 mg by mouth daily.       Marland Kitchen LORazepam (ATIVAN) 0.5 MG tablet Take 0.5 mg by mouth every 8 (eight) hours as needed for anxiety.       . mometasone (NASONEX) 50 MCG/ACT nasal spray Place 1 spray into the nose daily.       . Multiple Vitamin (MULITIVITAMIN WITH MINERALS) TABS Take 1 tablet by mouth daily.      . Olopatadine HCl 0.2 % SOLN Apply 1 drop to eye daily as needed. For dry eyes.      Marland Kitchen omeprazole (PRILOSEC) 20 MG capsule Take 20 mg by mouth 2 (two) times daily.       Marland Kitchen topiramate (TOPAMAX) 100 MG tablet Take 100 mg by mouth at bedtime.      . valsartan-hydrochlorothiazide (DIOVAN-HCT) 160-12.5 MG per tablet Take 1 tablet by mouth daily.      . vitamin B-12 (CYANOCOBALAMIN) 1000 MCG tablet Take 1,000 mcg by mouth daily.      Marland Kitchen zolpidem (AMBIEN) 10 MG tablet Take 10 mg by mouth at bedtime as needed. For sleep.        LABS/IMAGING: Results for orders placed during the hospital encounter of 03/11/13 (from the past 48 hour(s))  CBC WITH DIFFERENTIAL     Status: Abnormal   Collection Time    03/11/13 11:33 AM      Result Value Range   WBC 10.9 (*) 4.0 - 10.5 K/uL   RBC 4.76  3.87 - 5.11 MIL/uL   Hemoglobin 12.7  12.0 - 15.0 g/dL   HCT 09.8  11.9 - 14.7 %   MCV 80.5  78.0 - 100.0 fL   MCH 26.7  26.0 - 34.0 pg   MCHC 33.2  30.0 - 36.0 g/dL   RDW 82.9 (*) 56.2 - 13.0 %   Platelets 286  150 - 400 K/uL   Neutrophils  Relative % 73  43 - 77 %   Neutro Abs 7.9 (*) 1.7 - 7.7 K/uL   Lymphocytes Relative 20  12 - 46 %   Lymphs Abs 2.2  0.7 - 4.0 K/uL   Monocytes Relative 5  3 - 12 %   Monocytes Absolute 0.6  0.1 - 1.0 K/uL   Eosinophils Relative 2  0 - 5 %   Eosinophils Absolute 0.2  0.0 - 0.7 K/uL   Basophils Relative 0  0 - 1 %   Basophils Absolute 0.0  0.0 - 0.1 K/uL  POCT I-STAT TROPONIN I     Status: None   Collection Time    03/11/13 11:46 AM      Result Value Range   Troponin i, poc 0.01  0.00 - 0.08 ng/mL   Comment 3            Comment: Due to the release kinetics of cTnI,     a negative result within the first hours     of the onset of symptoms does not rule out     myocardial infarction with certainty.     If myocardial infarction is still suspected,     repeat the test at appropriate intervals.  POCT I-STAT, CHEM 8     Status: Abnormal   Collection Time    03/11/13 11:47 AM      Result Value Range   Sodium 139  135 - 145 mEq/L   Potassium 3.3 (*) 3.5 - 5.1 mEq/L   Chloride 100  96 - 112 mEq/L   BUN 12  6 - 23 mg/dL   Creatinine, Ser 1.61  0.50 - 1.10 mg/dL   Glucose, Bld 096 (*) 70 - 99 mg/dL   Calcium, Ion 0.45  4.09 - 1.23 mmol/L   TCO2 26  0 - 100 mmol/L   Hemoglobin 13.9  12.0 - 15.0 g/dL   HCT 81.1  91.4 - 78.2 %  POCT I-STAT TROPONIN I     Status: None   Collection Time    03/11/13  5:50 PM      Result Value Range   Troponin i, poc 0.02  0.00 - 0.08 ng/mL   Comment 3            Comment: Due to the release kinetics of cTnI,     a negative result within the first hours     of the onset of symptoms does not rule out     myocardial infarction with certainty.     If myocardial infarction is still suspected,     repeat the test at appropriate intervals.   Dg Chest 2 View  03/11/2013   *RADIOLOGY REPORT*  Clinical Data: 51 year old female chest pain right arm pain hypertension.  CHEST - 2 VIEW  Comparison: 08/15/2011 earlier.  Findings: Mildly larger lung volumes. Normal  cardiac size and mediastinal contours.  Visualized tracheal air column is within normal limits.  Cervical ACDF hardware again noted.  Lung parenchyma stable and clear.  No pneumothorax or effusion. No acute osseous abnormality identified.  IMPRESSION: Negative, no acute cardiopulmonary abnormality.   Original Report Authenticated By: Erskine Speed, [name]    Theodoro KosCeasar Mons Vitals:   03/11/13 1815  BP: 104/55  Pulse: 82  Temp:   Resp: 17   EXAM: General appearance: alert, cooperative, no distress, morbidly obese and Pleasant mood and affect, answers questions appropriate. Neck: no adenopathy, no carotid bruit, no  JVD, supple, symmetrical, trachea midline, thyroid not enlarged, symmetric, no tenderness/mass/nodules and Unable to fully assess JVP do to the body habitus. Lungs: clear to auscultation bilaterally, normal percussion bilaterally and Nonlabored, just distant breath sounds Heart: regular rate and rhythm, S1, S2 normal, no murmur, click, rub or gallop and Distant heart sounds unable to palpate PMI due to body habitus Abdomen: soft, non-tender; bowel sounds normal; no masses,  no organomegaly and Morbidly obese, unable to palpate organomegaly or masses due to obesity. Extremities: extremities normal, atraumatic, no cyanosis or edema Pulses: 2+ and symmetric Neurologic: Mental status: Alert, oriented, thought content appropriate Cranial nerves: normal HEENT: [place]/AT, EOMI, MMM, anicteric sclera On palpation of the chest wall. She had easily reproducible pain on the left costochondral margin cone of bone on the left rib cage underneath the breast. Also of along the the first rib up to the shoulder. This reproduced his symptoms she was having in without an outpatient I was able to reproduce her pain with inspiration and expiration  IMPRESSION: Principal Problem:   Chest pain with low risk of acute coronary syndrome - most likely musculoskeletal Active Problems:   Hyperlipidemia   Sleep  apnea   Hypertension   PLAN: 1. Admit under observation to rule out myocardial infarction with cardiac enzymes. 2. My suspicion is that this symptom is probably musculoskeletal in nature. It is reproducible exam. treat with nonsteroidal anti--inflammatory medications.  3. Continue home medications. Anticipate discharge soon. If she rules out we'll consider nuclear stress test in the morning versus outpatient nuclear stress test.   Rileigh Kawashima W 03/11/2013, 6:35 PM

## 2013-03-11 NOTE — ED Notes (Signed)
Pt sts she had some similar pain a few years ago, had a full work up down and had stress test preform, was told everything looked good and was dx with enlarged diaphragm that was causing her CP.

## 2013-03-11 NOTE — Progress Notes (Signed)
Placed patient on Cpap of 10 and .21% fi02.  Patient did not know her home settings.  Patient is tolerating well and states that this pressure feels like home.

## 2013-03-11 NOTE — ED Notes (Addendum)
Chest pain started 45 mins ago; denies sweaty and nausea; left arm pain when she woke up this morning. Hx of HTN; denies smoking.

## 2013-03-11 NOTE — ED Notes (Addendum)
Pt sts yesterday afternoon she was getting constant CP in right side of chest then today it started in left side of chest with radiation to left arm. Pt's husband rubbed tiger balm on left arm, pt sts this helped ease her left arm pain a lot. Denies sob/n/v/dizziness along with these pains. Sts over the past week she has been taking tompax for her migraines, has had a few last week. Pt in nad, skin warm and dry, resp e/u. Denies injury/fal to chest and arms. sts she has had rotator cuff sx so it is common for her left shoulder to hurt.

## 2013-03-11 NOTE — ED Provider Notes (Signed)
History    CSN: [id number] Arrival date & time 03/11/13  1121  First MD Initiated Contact with Patient 03/11/13 1341     Chief Complaint  Patient presents with  . Chest Pain   (Consider location/radiation/quality/duration/timing/severity/associated sxs/prior Treatment) The history is provided by the patient, the spouse and medical records.   Pt presents to the ED for chest pain.  Pt states she was semi lightheaded upon waking this morning and felt off balance while walking down the hall.  These sx quickly resolved but upon arriving to work where she works at [employer], she felt a sudden onset of left sided chest pressure radiating into her left shoulder and down her left arm associated with some SOB.  Chest pain not related to exertion or associated with palpitations, dizziness, weakness, numbness or paresthesias of UE.  Pt has previously been evaluated by cardiology, Dr. Royann Shivers with [facility] and follows up on a PRN basis- last visit over a year ago.  Pt has had negative stress testing in the past but has never undergone cardiac catherization.  No personal or family hx of CAD or MI.  Pts father had CHF.  Denies any abdominal pain, nausea, vomiting, or diarrhea.  LE swelling minimal since furosemide has been increased.  No recent travel or surgeries.  Pt does have chronic left shoulder pain from previous RC tear, repaired surgically on 05/15/12 by Dr. Thurston Hole- this was without complication.  Past Medical History  Diagnosis Date  . Hyperlipidemia   . GERD (gastroesophageal reflux disease)   . Headache(784.0)     migraine-like  . Degenerative disc disease   . History of UTI   . Sleep apnea     uses CPAP nightly  . Shoulder impingement 04/2012    left  . Asthma     daily and prn inhalers  . Hypertension     under control, has been on med. x 4-5 yrs.  . Anemia     takes iron supplement  . History of TIA (transient ischemic attack) early 2013    no weakness or deficits  . Partial tear of  rotator cuff(726.13)    Past Surgical History  Procedure Laterality Date  . Umbilical hernia repair  03/28/2002  . Dilation and curettage of uterus      2000  . Anterior cervical decomp/discectomy fusion  02/18/2010    C6-7  . Total knee arthroplasty  09/07/2009    left  . Total knee arthroplasty  10/13/2008    right  . Abdominal hysterectomy  03/28/2002    complete   Family History  Problem Relation Age of Onset  . Diabetes Mother   . Kidney disease Father   . Breast cancer Sister   . Diabetes Sister    History  Substance Use Topics  . Smoking status: Former Smoker    Quit date: 10/02/1999  . Smokeless tobacco: Never Used  . Alcohol Use: No   OB History   Grav Para Term Preterm Abortions TAB SAB Ect Mult Living                 Review of Systems  Respiratory: Positive for shortness of breath.   Cardiovascular: Positive for chest pain.  All other systems reviewed and are negative.    Allergies  Claritin  Home Medications   Current Outpatient Rx  Name  Route  Sig  Dispense  Refill  . acidophilus (RISAQUAD) CAPS   Oral   Take 1 capsule by mouth every morning.          Marland Kitchen  albuterol (PROVENTIL HFA;VENTOLIN HFA) 108 (90 BASE) MCG/ACT inhaler   Inhalation   Inhale 2 puffs into the lungs every 6 (six) hours as needed for wheezing or shortness of breath.          Marland Kitchen atorvastatin (LIPITOR) 20 MG tablet   Oral   Take 20 mg by mouth every evening.          . baclofen (LIORESAL) 10 MG tablet   Oral   Take 10 mg by mouth 2 (two) times a week. No specific days         . beclomethasone (QVAR) 80 MCG/ACT inhaler   Inhalation   Inhale 2 puffs into the lungs 2 (two) times daily.         . Calcium Carbonate-Vitamin D (CALCIUM-VITAMIN D) 500-200 MG-UNIT per tablet   Oral   Take 1 tablet by mouth daily.           . ciclesonide (OMNARIS) 50 MCG/ACT nasal spray   Each Nare   Place 2 sprays into both nostrils daily.         . cyclobenzaprine (FLEXERIL) 10 MG  tablet   Oral   Take 10 mg by mouth 3 (three) times daily as needed for muscle spasms.         . diclofenac (VOLTAREN) 75 MG EC tablet   Oral   Take 75 mg by mouth 2 (two) times daily.         . diclofenac sodium (VOLTAREN) 1 % GEL   Topical   Apply 2 g topically 4 (four) times daily as needed (applies to wrists).         . ferrous sulfate 325 (65 FE) MG tablet   Oral   Take 325 mg by mouth 2 (two) times daily.         . furosemide (LASIX) 20 MG tablet   Oral   Take 20 mg by mouth daily.          . hydrOXYzine (ATARAX/VISTARIL) 25 MG tablet   Oral   Take 25 mg by mouth every 8 (eight) hours as needed. For itching.         [place] Lanius Omega-3 300 MG CAPS   Oral   Take 1 tablet by mouth daily.         Marland Kitchen loratadine (CLARITIN) 10 MG tablet   Oral   Take 20 mg by mouth daily.          Marland Kitchen LORazepam (ATIVAN) 0.5 MG tablet   Oral   Take 0.5 mg by mouth every 8 (eight) hours as needed for anxiety.          . mometasone (NASONEX) 50 MCG/ACT nasal spray   Nasal   Place 1 spray into the nose daily.          . Multiple Vitamin (MULITIVITAMIN WITH MINERALS) TABS   Oral   Take 1 tablet by mouth daily.         . Olopatadine HCl 0.2 % SOLN   Ophthalmic   Apply 1 drop to eye daily as needed. For dry eyes.         Marland Kitchen omeprazole (PRILOSEC) 20 MG capsule   Oral   Take 20 mg by mouth 2 (two) times daily.          Marland Kitchen topiramate (TOPAMAX) 100 MG tablet   Oral   Take 100 mg by mouth at bedtime.         . valsartan-hydrochlorothiazide (DIOVAN-HCT) 160-12.5  MG per tablet   Oral   Take 1 tablet by mouth daily.         . vitamin B-12 (CYANOCOBALAMIN) 1000 MCG tablet   Oral   Take 1,000 mcg by mouth daily.         Marland Kitchen zolpidem (AMBIEN) 10 MG tablet   Oral   Take 10 mg by mouth at bedtime as needed. For sleep.          BP 132/92  Pulse 113  Temp(Src) 97.5 F (36.4 C) (Oral)  Resp 16  SpO2 96%  Physical Exam  Nursing note and vitals  reviewed. Constitutional: She is oriented to person, place, and time. She appears well-developed and well-nourished. No distress.  HENT:  Head: Normocephalic and atraumatic.  Mouth/Throat: Oropharynx is clear and moist.  Eyes: Conjunctivae and EOM are normal.  Neck: Normal range of motion. Neck supple.  Cardiovascular: Normal rate, regular rhythm and normal heart sounds.   Pulmonary/Chest: Effort normal and breath sounds normal. No respiratory distress. She has no decreased breath sounds. She has no wheezes.  Chest pain not reproducible with palpation to chest wall- states it makes pressure more pronounced  Abdominal: Soft. Bowel sounds are normal. There is no tenderness. There is no guarding.  Musculoskeletal: Normal range of motion. She exhibits no edema.  Neurological: She is alert and oriented to person, place, and time. She has normal strength. No cranial nerve deficit or sensory deficit.  CN grossly intact, moves all extremities appropriately, no acute neuro deficits or facial droop appreciated  Skin: Skin is warm and dry. She is not diaphoretic.  Psychiatric: She has a normal mood and affect.    ED Course  Procedures (including critical care time)   Date: 03/11/2013  Rate: 112  Rhythm: sinus tachycardia  QRS Axis: normal  Intervals: normal  ST/T Wave abnormalities: nonspecific T wave changes  Conduction Disutrbances:none  Narrative Interpretation: sinus tach, no STEMI  Old EKG Reviewed: none available   Labs Reviewed  CBC WITH DIFFERENTIAL - Abnormal; Notable for the following:    WBC 10.9 (*)    RDW 16.0 (*)    Neutro Abs 7.9 (*)    All other components within normal limits  POCT I-STAT, CHEM 8 - Abnormal; Notable for the following:    Potassium 3.3 (*)    Glucose, Bld 183 (*)    All other components within normal limits  POCT I-STAT TROPONIN I   Dg Chest 2 View  03/11/2013   *RADIOLOGY REPORT*  Clinical Data: 52 year old female chest pain right arm pain  hypertension.  CHEST - 2 VIEW  Comparison: 08/15/2011 earlier.  Findings: Mildly larger lung volumes. Normal cardiac size and mediastinal contours.  Visualized tracheal air column is within normal limits.  Cervical ACDF hardware again noted.  Lung parenchyma stable and clear.  No pneumothorax or effusion. No acute osseous abnormality identified.  IMPRESSION: Negative, no acute cardiopulmonary abnormality.   Original Report Authenticated By: Erskine Speed, [name]   1. Hypertension   2. Chest pain with low risk for cardiac etiology     MDM   EKG sinus tach, no acute ischemic changes.  Trop negative.  CXR clear.  Sx somewhat improved with SL nitro.  Discussed with Dr. Herbie Baltimore [facility]), pt will be admitted for CP r/o.  VS stable for transfer upstairs.   Garlon Hatchet, PA-C 03/11/13 2040

## 2013-03-12 ENCOUNTER — Observation Stay (HOSPITAL_COMMUNITY): Payer: 59

## 2013-03-12 ENCOUNTER — Encounter (HOSPITAL_COMMUNITY): Payer: Self-pay | Admitting: *Deleted

## 2013-03-12 DIAGNOSIS — G4730 Sleep apnea, unspecified: Secondary | ICD-10-CM

## 2013-03-12 DIAGNOSIS — E669 Obesity, unspecified: Secondary | ICD-10-CM | POA: Diagnosis present

## 2013-03-12 DIAGNOSIS — Z8249 Family history of ischemic heart disease and other diseases of the circulatory system: Secondary | ICD-10-CM

## 2013-03-12 LAB — CBC
HCT: 35.2 % — ABNORMAL LOW (ref 36.0–46.0)
Hemoglobin: 11.6 g/dL — ABNORMAL LOW (ref 12.0–15.0)
MCH: 26.7 pg (ref 26.0–34.0)
MCHC: 33 g/dL (ref 30.0–36.0)
MCV: 80.9 fL (ref 78.0–100.0)
Platelets: 282 10*3/uL (ref 150–400)
RBC: 4.35 MIL/uL (ref 3.87–5.11)
RDW: 16.2 % — ABNORMAL HIGH (ref 11.5–15.5)
WBC: 11.5 10*3/uL — ABNORMAL HIGH (ref 4.0–10.5)

## 2013-03-12 LAB — LIPID PANEL
Cholesterol: 143 mg/dL (ref 0–200)
HDL: 42 mg/dL (ref >39)
LDL Cholesterol: 75 mg/dL (ref 0–99)
Total CHOL/HDL Ratio: 3.4 RATIO
Triglycerides: 128 mg/dL (ref <150)
VLDL: 26 mg/dL (ref 0–40)

## 2013-03-12 LAB — BASIC METABOLIC PANEL
BUN: 11 mg/dL (ref 6–23)
CO2: 27 mEq/L (ref 19–32)
Calcium: 9.4 mg/dL (ref 8.4–10.5)
Chloride: 102 mEq/L (ref 96–112)
Creatinine, Ser: 0.81 mg/dL (ref 0.50–1.10)
GFR calc Af Amer: >90 mL/min (ref >90)
GFR calc non Af Amer: 83 mL/min — ABNORMAL LOW (ref >90)
Glucose, Bld: 124 mg/dL — ABNORMAL HIGH (ref 70–99)
Potassium: 3.2 mEq/L — ABNORMAL LOW (ref 3.5–5.1)
Sodium: 138 mEq/L (ref 135–145)

## 2013-03-12 LAB — TROPONIN I
Troponin I: <0.3 ng/mL (ref <0.30)
Troponin I: <0.3 ng/mL (ref <0.30)

## 2013-03-12 MED ORDER — SODIUM CHLORIDE 0.9 % IJ SOLN
80.0000 mg | INTRAVENOUS | Status: AC
  Filled 2013-03-12: qty 3.2

## 2013-03-12 MED ORDER — REGADENOSON 0.4 MG/5ML IV SOLN
INTRAVENOUS | Status: AC
  Administered 2013-03-12: 0.4 mg via INTRAVENOUS
  Filled 2013-03-12: qty 5

## 2013-03-12 NOTE — Progress Notes (Signed)
Patient stated that she would place herself on CPAP.  RT made patient aware that if she needed any help to let her RN know and RT would come and assist.  Machine is set on a CPAP pressure of 10 CMH2O.

## 2013-03-12 NOTE — Progress Notes (Signed)
Subjective:  "electric shock" pain last night.  Objective:  Vital Signs in the last 24 hours: Temp:  [97.5 F (36.4 C)-98.1 F (36.7 C)] 97.7 F (36.5 C) (07/01 0654) Pulse Rate:  [82-113] 85 (07/01 0654) Resp:  [16-27] 18 (07/01 0654) BP: (82-135)/(43-92) 119/63 mmHg (07/01 0654) SpO2:  [96 %-100 %] 99 % (07/01 0654) Weight:  [122 kg (268 lb 15.4 oz)-122.063 kg (269 lb 1.6 oz)] 122 kg (268 lb 15.4 oz) (07/01 0654)  Intake/Output from previous day:  Intake/Output Summary (Last 24 hours) at 03/12/13 1023 Last data filed at 03/12/13 0900  Gross per 24 hour  Intake      0 ml  Output      0 ml  Net      0 ml    Physical Exam: General appearance: alert, cooperative, appears stated age, no distress and morbidly obese Lungs: clear to auscultation bilaterally Heart: regular rate and rhythm   Rate: 85  Rhythm: normal sinus rhythm  Lab Results:  Recent Labs  03/11/13 2006 03/12/13 0200  WBC 11.9* 11.5*  HGB 12.3 11.6*  PLT 283 282    Recent Labs  03/11/13 1147 03/11/13 2006 03/12/13 0200  NA 139  --  138  K 3.3*  --  3.2*  CL 100  --  102  CO2  --   --  27  GLUCOSE 183*  --  124*  BUN 12  --  11  CREATININE 0.90 0.78 0.81    Recent Labs  03/12/13 0200 03/12/13 0750  TROPONINI <0.30 <0.30   Hepatic Function Panel No results found for this basename: PROT, ALBUMIN, AST, ALT, ALKPHOS, BILITOT, BILIDIR, IBILI,  in the last 72 hours  Recent Labs  03/12/13 0200  CHOL 143   No results found for this basename: INR,  in the last 72 hours  Imaging: Imaging results have been reviewed  Cardiac Studies:  Assessment/Plan:   Principal Problem:   Chest pain with low risk of acute coronary syndrome - most likely musculoskeletal Active Problems:   Hyperlipidemia   Sleep apnea   Hypertension   Family Hx of CAD    PLAN: Myoview today. She is a two day protocol secondary to obesity but if her stress images are negative I'm not sure she would need to stay for  rest images.  Corine Shelter PA-C Beeper [phone] 03/12/2013, 10:23 AM

## 2013-03-12 NOTE — ED Provider Notes (Signed)
Medical screening examination/treatment/procedure(s) were performed by non-physician practitioner and as supervising physician I was immediately available for consultation/collaboration.   Tyarra Nolton B. Careli Luzader, MD 03/12/13 2112 

## 2013-03-12 NOTE — Progress Notes (Signed)
Utilization review complete. Soma Bachand RN CCM Case Mgmt phone [phone] 

## 2013-03-12 NOTE — Progress Notes (Signed)
Pt. Seen and examined. Agree with the NP/PA-C note as written.  Atypical chest pain. Awaiting 2 day myoview images. Will re-evaluate tomorrow.  Chrystie Nose, MD, [facility] Attending Cardiologist [facility]

## 2013-03-13 ENCOUNTER — Observation Stay (HOSPITAL_COMMUNITY): Payer: 59

## 2013-03-13 ENCOUNTER — Encounter (HOSPITAL_COMMUNITY): Payer: Self-pay | Admitting: Cardiology

## 2013-03-13 ENCOUNTER — Observation Stay (HOSPITAL_COMMUNITY): Admit: 2013-03-13 | Payer: 59

## 2013-03-13 DIAGNOSIS — Z8249 Family history of ischemic heart disease and other diseases of the circulatory system: Secondary | ICD-10-CM

## 2013-03-13 MED ORDER — DICLOFENAC SODIUM 75 MG PO TBEC: 75.0000 mg | DELAYED_RELEASE_TABLET | Freq: Two times a day (BID) | ORAL | Status: DC

## 2013-03-13 MED ORDER — TECHNETIUM TC 99M SESTAMIBI GENERIC - CARDIOLITE
30.0000 | Freq: Once | INTRAVENOUS | Status: AC | PRN
  Administered 2013-03-13: 30 via INTRAVENOUS

## 2013-03-13 MED ORDER — CALCIUM CARBONATE-VITAMIN D 500-200 MG-UNIT PO TABS: 1.0000 | ORAL_TABLET | Freq: Every day | ORAL | Status: DC

## 2013-03-13 NOTE — Discharge Summary (Signed)
Physician Discharge Summary      Patient ID: [name] MRN: [id number] DOB/AGE: 01/04/1961 52 y.o.  Admit date: 03/11/2013 Discharge date: 03/13/2013  Discharge Diagnoses:  Principal Problem:   Chest pain with low risk of acute coronary syndrome - most likely musculoskeletal, negative nuc study Active Problems:   Hyperlipidemia   Degenerative disc disease   Sleep apnea   Hypertension   Family history of coronary artery disease   Morbid obesity   Discharged Condition: good  Procedures: none   Hospital Course: 52 y.o. female with a past medical history significant for obesity and OSA. She's had a relatively full cardiac evaluation roughly a year ago by Dr. Royann Shivers for preoperative evaluation for rotator cuff surgery.   She was doing relatively well until the morning of 03/10/13 when she woke up and noticed the sharp pains across the right center of her chest. 03/11/13 she had similar to symptoms along with the left side of her chest that went up toward her left shoulder and then also noted pain in her left arm. This concerned her husband so they decided to come to the emergency room for evaluation. In the emergency room she was given some nitroglycerin which made her relax but no change in her pain. She doesn't notice that the pain is made worse with deep inspiration and coughing as well certain movements. She also notes it is worse with walking around but not significantly worse. She doesn't having significant shortness of breath associated with it. She did have a little nausea with it morning of admit, no lightheadedness dizziness. No PND, orthopnea or edema. No melena, hematochezia hematuria. No TIA or amaurosis fugax in this. She has baseline edema there is relatively well-controlled with her Lasix.   She was admitted to obs to rule out MI and undergo Nuc. Study.  Cardiac enzymes neg. She underwent stress portion of nuc study that is negative for ischemia.  EF 60%.  She had no  recurrent chest pain.  She was seen by Dr. Allyson Sabal 03/13/2013 and found to be stable for discharge.    Consults: None  Significant Diagnostic Studies:  BMET    Component Value Date/Time   NA 138 03/12/2013 0200   K 3.2* 03/12/2013 0200   CL 102 03/12/2013 0200   CO2 27 03/12/2013 0200   GLUCOSE 124* 03/12/2013 0200   BUN 11 03/12/2013 0200   CREATININE 0.81 03/12/2013 0200   CALCIUM 9.4 03/12/2013 0200   GFRNONAA 83* 03/12/2013 0200   GFRAA >90 03/12/2013 0200    CBC    Component Value Date/Time   WBC 11.5* 03/12/2013 0200   RBC 4.35 03/12/2013 0200   HGB 11.6* 03/12/2013 0200   HCT 35.2* 03/12/2013 0200   PLT 282 03/12/2013 0200   MCV 80.9 03/12/2013 0200   MCH 26.7 03/12/2013 0200   MCHC 33.0 03/12/2013 0200   RDW 16.2* 03/12/2013 0200   LYMPHSABS 2.2 03/11/2013 1133   MONOABS 0.6 03/11/2013 1133   EOSABS 0.2 03/11/2013 1133   BASOSABS 0.0 03/11/2013 1133   Negative troponin's  Total chol 143, TG 128, HDL 42, LDL 75  CHEST - 2 VIEW Comparison: 08/15/2011 earlier. Findings: Mildly larger lung volumes. Normal cardiac size and mediastinal contours. Visualized tracheal air column is within normal limits. Cervical ACDF hardware again noted. Lung parenchyma stable and clear. No pneumothorax or effusion. No acute osseous abnormality identified.  IMPRESSION: Negative, no acute cardiopulmonary abnormality.   LEXISCAN Myoview Findings: Cardiac uptake is within normal limits. The  stress images demonstrate no significant perfusion defects. Normal contractility and wall motion is present. The estimated diastolic volume is 69 ml. The estimated systolic volume is 27 ml. The calculated ejection fraction is less 60%.  IMPRESSION:  1. Normal exercise nuclear medicine cardiac stress examination. 2. Normal wall motion and contractility. 3. Normal ejection fraction of 60%   Discharge Exam: Blood pressure 137/85, pulse 82, temperature 97.6 F (36.4 C), temperature source Oral, resp. rate 18, height 5\' 2"  (1.575  m), weight 268 lb 15.4 oz (122 kg), SpO2 99.00%.  AM Exam:   Physical Exam:  General appearance: alert and no distress  Neck: no adenopathy, no carotid bruit, no JVD, supple, symmetrical, trachea midline and thyroid not enlarged, symmetric, no tenderness/mass/nodules  Lungs: clear to auscultation bilaterally  Heart: regular rate and rhythm, S1, S2 normal, no murmur, click, rub or gallop  Extremities: extremities normal, atraumatic, no cyanosis or edema     Disposition: 01-Home or Self Care       Future Appointments Provider Department Dept Phone   03/25/2013 9:20 AM Nada Boozer, NP [facility] [place] [phone]       Medication List    STOP taking these medications       baclofen 10 MG tablet  Commonly known as:  LIORESAL     ciclesonide 50 MCG/ACT nasal spray  Commonly known as:  OMNARIS     Krill Oil Omega-3 300 MG Caps      TAKE these medications       acidophilus Caps  Take 1 capsule by mouth every morning.     albuterol 108 (90 BASE) MCG/ACT inhaler  Commonly known as:  PROVENTIL HFA;VENTOLIN HFA  Inhale 2 puffs into the lungs every 6 (six) hours as needed for wheezing or shortness of breath.     atorvastatin 20 MG tablet  Commonly known as:  LIPITOR  Take 20 mg by mouth every evening.     beclomethasone 80 MCG/ACT inhaler  Commonly known as:  QVAR  Inhale 2 puffs into the lungs 2 (two) times daily.     calcium-vitamin D 500-200 MG-UNIT per tablet  Commonly known as:  OSCAL WITH D  Take 1 tablet by mouth daily with breakfast.     cyclobenzaprine 10 MG tablet  Commonly known as:  FLEXERIL  Take 10 mg by mouth 3 (three) times daily as needed for muscle spasms.     diclofenac 75 MG EC tablet  Commonly known as:  VOLTAREN  Take 1 tablet (75 mg total) by mouth 2 (two) times daily.     diclofenac sodium 1 % Gel  Commonly known as:  VOLTAREN  Apply 2 g topically 4 (four) times daily as needed (applies to wrists).      ferrous sulfate 325 (65 FE) MG tablet  Take 325 mg by mouth 2 (two) times daily.     furosemide 20 MG tablet  Commonly known as:  LASIX  Take 20 mg by mouth daily.     hydrOXYzine 25 MG tablet  Commonly known as:  ATARAX/VISTARIL  Take 25 mg by mouth every 8 (eight) hours as needed. For itching.     loratadine 10 MG tablet  Commonly known as:  CLARITIN  Take 20 mg by mouth daily.     LORazepam 0.5 MG tablet  Commonly known as:  ATIVAN  Take 0.5 mg by mouth every 8 (eight) hours as needed for anxiety.     mometasone 50 MCG/ACT nasal spray  Commonly known as:  NASONEX  Place 1 spray into the nose daily.     multivitamin with minerals Tabs  Take 1 tablet by mouth daily.     Olopatadine HCl 0.2 % Soln  Apply 1 drop to eye daily as needed. For dry eyes.     omeprazole 20 MG capsule  Commonly known as:  PRILOSEC  Take 20 mg by mouth 2 (two) times daily.     topiramate 100 MG tablet  Commonly known as:  TOPAMAX  Take 100 mg by mouth at bedtime.     valsartan-hydrochlorothiazide 160-12.5 MG per tablet  Commonly known as:  DIOVAN-HCT  Take 1 tablet by mouth daily.     vitamin B-12 1000 MCG tablet  Commonly known as:  CYANOCOBALAMIN  Take 1,000 mcg by mouth daily.     zolpidem 10 MG tablet  Commonly known as:  AMBIEN  Take 10 mg by mouth at bedtime as needed. For sleep.       Follow-up Information   Follow up with [facility] R, NP On 03/25/2013. (at 0920am)    Contact information:   [address] [place] [address] [phone]      Discharge Instructions: Heart Healthy diet.  Call if further problems.   Signed: Leone Brand Nurse Practitioner-Certified [facility] 03/13/2013, 3:04 PM  Time spent on discharge : 25 minutes.

## 2013-03-13 NOTE — Discharge Instructions (Signed)
Heart Healthy diet.  Call if further problems.   Chest Pain (Nonspecific) It is often hard to give a specific diagnosis for the cause of chest pain. There is always a chance that your pain could be related to something serious, such as a heart attack or a blood clot in the lungs. You need to follow up with your caregiver for further evaluation. CAUSES   Heartburn.  Pneumonia or bronchitis.  Anxiety or stress.  Inflammation around your heart (pericarditis) or lung (pleuritis or pleurisy).  A blood clot in the lung.  A collapsed lung (pneumothorax). It can develop suddenly on its own (spontaneous pneumothorax) or from injury (trauma) to the chest.  Shingles infection (herpes zoster virus). The chest wall is composed of bones, muscles, and cartilage. Any of these can be the source of the pain.  The bones can be bruised by injury.  The muscles or cartilage can be strained by coughing or overwork.  The cartilage can be affected by inflammation and become sore (costochondritis). DIAGNOSIS  Lab tests or other studies, such as X-rays, electrocardiography, stress testing, or cardiac imaging, may be needed to find the cause of your pain.  TREATMENT   Treatment depends on what may be causing your chest pain. Treatment may include:  Acid blockers for heartburn.  Anti-inflammatory medicine.  Pain medicine for inflammatory conditions.  Antibiotics if an infection is present.  You may be advised to change lifestyle habits. This includes stopping smoking and avoiding alcohol, caffeine, and chocolate.  You may be advised to keep your head raised (elevated) when sleeping. This reduces the chance of acid going backward from your stomach into your esophagus.  Most of the time, nonspecific chest pain will improve within 2 to 3 days with rest and mild pain medicine. HOME CARE INSTRUCTIONS   If antibiotics were prescribed, take your antibiotics as directed. Finish them even if you start to  feel better.  For the next few days, avoid physical activities that bring on chest pain. Continue physical activities as directed.  Do not smoke.  Avoid drinking alcohol.  Only take over-the-counter or prescription medicine for pain, discomfort, or fever as directed by your caregiver.  Follow your caregiver's suggestions for further testing if your chest pain does not go away.  Keep any follow-up appointments you made. If you do not go to an appointment, you could develop lasting (chronic) problems with pain. If there is any problem keeping an appointment, you must call to reschedule. SEEK MEDICAL CARE IF:   You think you are having problems from the medicine you are taking. Read your medicine instructions carefully.  Your chest pain does not go away, even after treatment.  You develop a rash with blisters on your chest. SEEK IMMEDIATE MEDICAL CARE IF:   You have increased chest pain or pain that spreads to your arm, neck, jaw, back, or abdomen.  You develop shortness of breath, an increasing cough, or you are coughing up blood.  You have severe back or abdominal pain, feel nauseous, or vomit.  You develop severe weakness, fainting, or chills.  You have a fever. THIS IS AN EMERGENCY. Do not wait to see if the pain will go away. Get medical help at once. Call your local emergency services (911 in [place] Do not drive yourself to the hospital. MAKE SURE YOU:   Understand these instructions.  Will watch your condition.  Will get help right away if you are not doing well or get worse. Document Released: 06/08/2005 Document  Revised: 11/21/2011 Document Reviewed: 04/03/2008 [facility] Patient Information 2014 [place], [place].

## 2013-03-13 NOTE — Progress Notes (Signed)
Subjective:  No CP/SOB  Objective:  Temp:  [97.3 F (36.3 C)-98.6 F (37 C)] 97.6 F (36.4 C) (07/02 0500) Pulse Rate:  [82-89] 82 (07/02 0500) Resp:  [18-20] 18 (07/02 0500) BP: (107-137)/(50-85) 137/85 mmHg (07/02 0500) SpO2:  [96 %-100 %] 99 % (07/02 0953) Weight change:   Intake/Output from previous day: 07/01 0701 - 07/02 0700 In: 240 [P.O.:240] Out: -   Intake/Output from this shift:    Physical Exam: General appearance: alert and no distress Neck: no adenopathy, no carotid bruit, no JVD, supple, symmetrical, trachea midline and thyroid not enlarged, symmetric, no tenderness/mass/nodules Lungs: clear to auscultation bilaterally Heart: regular rate and rhythm, S1, S2 normal, no murmur, click, rub or gallop Extremities: extremities normal, atraumatic, no cyanosis or edema  Lab Results: Results for orders placed during the hospital encounter of 03/11/13 (from the past 48 hour(s))  CBC WITH DIFFERENTIAL     Status: Abnormal   Collection Time    03/11/13 11:33 AM      Result Value Range   WBC 10.9 (*) 4.0 - 10.5 K/uL   RBC 4.76  3.87 - 5.11 MIL/uL   Hemoglobin 12.7  12.0 - 15.0 g/dL   HCT 78.2  95.6 - 21.3 %   MCV 80.5  78.0 - 100.0 fL   MCH 26.7  26.0 - 34.0 pg   MCHC 33.2  30.0 - 36.0 g/dL   RDW 08.6 (*) 57.8 - 46.9 %   Platelets 286  150 - 400 K/uL   Neutrophils Relative % 73  43 - 77 %   Neutro Abs 7.9 (*) 1.7 - 7.7 K/uL   Lymphocytes Relative 20  12 - 46 %   Lymphs Abs 2.2  0.7 - 4.0 K/uL   Monocytes Relative 5  3 - 12 %   Monocytes Absolute 0.6  0.1 - 1.0 K/uL   Eosinophils Relative 2  0 - 5 %   Eosinophils Absolute 0.2  0.0 - 0.7 K/uL   Basophils Relative 0  0 - 1 %   Basophils Absolute 0.0  0.0 - 0.1 K/uL  POCT I-STAT TROPONIN I     Status: None   Collection Time    03/11/13 11:46 AM      Result Value Range   Troponin i, poc 0.01  0.00 - 0.08 ng/mL   Comment 3            Comment: Due to the release kinetics of cTnI,     a negative result  within the first hours     of the onset of symptoms does not rule out     myocardial infarction with certainty.     If myocardial infarction is still suspected,     repeat the test at appropriate intervals.  POCT I-STAT, CHEM 8     Status: Abnormal   Collection Time    03/11/13 11:47 AM      Result Value Range   Sodium 139  135 - 145 mEq/L   Potassium 3.3 (*) 3.5 - 5.1 mEq/L   Chloride 100  96 - 112 mEq/L   BUN 12  6 - 23 mg/dL   Creatinine, Ser 6.29  0.50 - 1.10 mg/dL   Glucose, Bld 528 (*) 70 - 99 mg/dL   Calcium, Ion 4.13  2.44 - 1.23 mmol/L   TCO2 26  0 - 100 mmol/L   Hemoglobin 13.9  12.0 - 15.0 g/dL   HCT 01.0  27.2 - 53.6 %  POCT  I-STAT TROPONIN I     Status: None   Collection Time    03/11/13  5:50 PM      Result Value Range   Troponin i, poc 0.02  0.00 - 0.08 ng/mL   Comment 3            Comment: Due to the release kinetics of cTnI,     a negative result within the first hours     of the onset of symptoms does not rule out     myocardial infarction with certainty.     If myocardial infarction is still suspected,     repeat the test at appropriate intervals.  TROPONIN I     Status: None   Collection Time    03/11/13  8:06 PM      Result Value Range   Troponin I <0.30  <0.30 ng/mL   Comment:            Due to the release kinetics of cTnI,     a negative result within the first hours     of the onset of symptoms does not rule out     myocardial infarction with certainty.     If myocardial infarction is still suspected,     repeat the test at appropriate intervals.  CBC     Status: Abnormal   Collection Time    03/11/13  8:06 PM      Result Value Range   WBC 11.9 (*) 4.0 - 10.5 K/uL   RBC 4.62  3.87 - 5.11 MIL/uL   Hemoglobin 12.3  12.0 - 15.0 g/dL   HCT 09.8  11.9 - 14.7 %   MCV 81.0  78.0 - 100.0 fL   MCH 26.6  26.0 - 34.0 pg   MCHC 32.9  30.0 - 36.0 g/dL   RDW 82.9 (*) 56.2 - 13.0 %   Platelets 283  150 - 400 K/uL  CREATININE, SERUM     Status: None    Collection Time    03/11/13  8:06 PM      Result Value Range   Creatinine, Ser 0.78  0.50 - 1.10 mg/dL   GFR calc non Af Amer >90  >90 mL/min   GFR calc Af Amer >90  >90 mL/min   Comment:            The eGFR has been calculated     using the CKD EPI equation.     This calculation has not been     validated in all clinical     situations.     eGFR's persistently     <90 mL/min signify     possible Chronic Kidney Disease.  TROPONIN I     Status: None   Collection Time    03/12/13  2:00 AM      Result Value Range   Troponin I <0.30  <0.30 ng/mL   Comment:            Due to the release kinetics of cTnI,     a negative result within the first hours     of the onset of symptoms does not rule out     myocardial infarction with certainty.     If myocardial infarction is still suspected,     repeat the test at appropriate intervals.  CBC     Status: Abnormal   Collection Time    03/12/13  2:00 AM      Result Value Range  WBC 11.5 (*) 4.0 - 10.5 K/uL   RBC 4.35  3.87 - 5.11 MIL/uL   Hemoglobin 11.6 (*) 12.0 - 15.0 g/dL   HCT 16.1 (*) 09.6 - 04.5 %   MCV 80.9  78.0 - 100.0 fL   MCH 26.7  26.0 - 34.0 pg   MCHC 33.0  30.0 - 36.0 g/dL   RDW 40.9 (*) 81.1 - 91.4 %   Platelets 282  150 - 400 K/uL  BASIC METABOLIC PANEL     Status: Abnormal   Collection Time    03/12/13  2:00 AM      Result Value Range   Sodium 138  135 - 145 mEq/L   Potassium 3.2 (*) 3.5 - 5.1 mEq/L   Chloride 102  96 - 112 mEq/L   CO2 27  19 - 32 mEq/L   Glucose, Bld 124 (*) 70 - 99 mg/dL   BUN 11  6 - 23 mg/dL   Creatinine, Ser 7.82  0.50 - 1.10 mg/dL   Calcium 9.4  8.4 - 95.6 mg/dL   GFR calc non Af Amer 83 (*) >90 mL/min   GFR calc Af Amer >90  >90 mL/min   Comment:            The eGFR has been calculated     using the CKD EPI equation.     This calculation has not been     validated in all clinical     situations.     eGFR's persistently     <90 mL/min signify     possible Chronic Kidney Disease.    LIPID PANEL     Status: None   Collection Time    03/12/13  2:00 AM      Result Value Range   Cholesterol 143  0 - 200 mg/dL   Triglycerides 213  <086 mg/dL   HDL 42  >57 mg/dL   Total CHOL/HDL Ratio 3.4     VLDL 26  0 - 40 mg/dL   LDL Cholesterol 75  0 - 99 mg/dL   Comment:            Total Cholesterol/HDL:CHD Risk     Coronary Heart Disease Risk Table                         Men   Women      1/2 Average Risk   3.4   3.3      Average Risk       5.0   4.4      2 X Average Risk   9.6   7.1      3 X Average Risk  23.4   11.0                Use the calculated Patient Ratio     above and the CHD Risk Table     to determine the patient's CHD Risk.                ATP III CLASSIFICATION (LDL):      <100     mg/dL   Optimal      846-962  mg/dL   Near or Above                        Optimal      130-159  mg/dL   Borderline      952-841  mg/dL   High      >  190     mg/dL   Very High  TROPONIN I     Status: None   Collection Time    03/12/13  7:50 AM      Result Value Range   Troponin I <0.30  <0.30 ng/mL   Comment:            Due to the release kinetics of cTnI,     a negative result within the first hours     of the onset of symptoms does not rule out     myocardial infarction with certainty.     If myocardial infarction is still suspected,     repeat the test at appropriate intervals.    Imaging: Imaging results have been reviewed  Assessment/Plan:   1. Principal Problem: 2.   Chest pain with low risk of acute coronary syndrome - most likely musculoskeletal 3. Active Problems: 4.   Hyperlipidemia 5.   Degenerative disc disease 6.   Sleep apnea 7.   Hypertension 8.   Family history of coronary artery disease 9.   Morbid obesity 10.   Time Spent Directly with Patient:  20 minutes  Length of Stay:  LOS: 2 days   Stress only MPI neg. Enz neg and EKG shows no changes. Exam benign. No recurrent CP. OK for D/C home today. ROV with [facility] next 2-3 weeks then with Dr.  Salena Saner after that.   Runell Gess 03/13/2013, 10:39 AM

## 2013-03-18 ENCOUNTER — Other Ambulatory Visit: Payer: Self-pay

## 2013-03-18 DIAGNOSIS — Z803 Family history of malignant neoplasm of breast: Secondary | ICD-10-CM

## 2013-03-18 DIAGNOSIS — Z1231 Encounter for screening mammogram for malignant neoplasm of breast: Secondary | ICD-10-CM

## 2013-03-19 ENCOUNTER — Other Ambulatory Visit: Payer: Self-pay | Admitting: Internal Medicine

## 2013-03-19 ENCOUNTER — Ambulatory Visit
Admission: RE | Admit: 2013-03-19 | Discharge: 2013-03-19 | Disposition: A | Discharge status: Home / Self Care | Payer: 59 | Source: Ambulatory Visit | Attending: Internal Medicine | Admitting: Internal Medicine

## 2013-03-19 DIAGNOSIS — R0602 Shortness of breath: Secondary | ICD-10-CM

## 2013-03-19 DIAGNOSIS — R079 Chest pain, unspecified: Secondary | ICD-10-CM

## 2013-03-19 MED ORDER — IOHEXOL 350 MG/ML SOLN
125.0000 mL | Freq: Once | INTRAVENOUS | Status: AC | PRN
  Administered 2013-03-19: 125 mL via INTRAVENOUS

## 2013-03-22 ENCOUNTER — Encounter: Payer: Self-pay | Admitting: *Deleted

## 2013-03-22 ENCOUNTER — Encounter: Payer: Self-pay | Admitting: Cardiovascular Disease

## 2013-03-25 ENCOUNTER — Encounter: Payer: Self-pay | Admitting: Cardiology

## 2013-03-25 ENCOUNTER — Ambulatory Visit (INDEPENDENT_AMBULATORY_CARE_PROVIDER_SITE_OTHER): Payer: 59 | Admitting: Cardiology

## 2013-03-25 VITALS — BP 130/70 | HR 73 | Ht 62.0 in | Wt 278.3 lb

## 2013-03-25 DIAGNOSIS — R079 Chest pain, unspecified: Secondary | ICD-10-CM

## 2013-03-25 DIAGNOSIS — I10 Essential (primary) hypertension: Secondary | ICD-10-CM

## 2013-03-25 DIAGNOSIS — R911 Solitary pulmonary nodule: Secondary | ICD-10-CM

## 2013-03-25 DIAGNOSIS — E785 Hyperlipidemia, unspecified: Secondary | ICD-10-CM

## 2013-03-25 DIAGNOSIS — Z8249 Family history of ischemic heart disease and other diseases of the circulatory system: Secondary | ICD-10-CM

## 2013-03-25 DIAGNOSIS — G4730 Sleep apnea, unspecified: Secondary | ICD-10-CM

## 2013-03-25 NOTE — Assessment & Plan Note (Addendum)
One episode of chest pain since discharge, not as severe.  I have asked her to call us if she has more than 2 episodes a week. She will follow up with Dr. Royann Shivers in 1 month.  If symptoms continue to occur cardiac cath may be necessary.

## 2013-03-25 NOTE — Assessment & Plan Note (Signed)
Continue use 

## 2013-03-25 NOTE — Assessment & Plan Note (Signed)
LDL controlled at 75 in hospital labs

## 2013-03-25 NOTE — Assessment & Plan Note (Signed)
CTA done for chest pain.  No PE, lung nodule noted, Dr. Timothy Lasso following.  Pt was just notified.

## 2013-03-25 NOTE — Assessment & Plan Note (Signed)
Increases risk factor for CAD

## 2013-03-25 NOTE — Patient Instructions (Signed)
Call if you have more than 2 episodes of chest pain a week.  You will follow up with Dr. Royann Shivers in 1 month.

## 2013-03-25 NOTE — Progress Notes (Signed)
03/25/2013   PCP: Gwen Pounds, MD   Chief Complaint  Patient presents with  . Follow-up    post hosp for chest pain,no cath, had myoview,1 episode since chest pain,fatigue,leg cramps,some sweliing not much    Primary Cardiologist:Dr. Croitoru  HPI :  52 y.o. female with a past medical history significant for obesity and OSA. She's had a relatively full cardiac evaluation roughly a year ago by Dr. Royann Shivers for preoperative evaluation for rotator cuff surgery. She was doing relatively well until the morning of 03/10/13 when she woke up and noticed the sharp pains across the right center of her chest. 03/11/13 she had similar to symptoms along with the left side of her chest that went up toward her left shoulder and then also noted pain in her left arm. This concerned her husband so they decided to come to the emergency room for evaluation. In the emergency room she was given some nitroglycerin which made her relax but no change in her pain. She doesn't notice that the pain is made worse with deep inspiration and coughing as well certain movements. She also notes it is worse with walking around but not significantly worse. She doesn't having significant shortness of breath associated with it. She did have a little nausea with it morning of admit, no lightheadedness dizziness. No PND, orthopnea or edema. No melena, hematochezia hematuria. No TIA or amaurosis fugax in this. She has baseline edema there is relatively well-controlled with her Lasix.   She was admitted to obs to rule out MI and undergo Nuc. Study. Cardiac enzymes neg. She underwent stress portion of nuc study that is negative for ischemia. EF 60%. She had no recurrent chest pain. She was seen by Dr. Allyson Sabal 03/13/2013 and found to be stable for discharge.    Since her discharge she has had one episode of chest pain midsternal not nearly as severe as when she was admitted.  It was felt with negative stress test this was  musculoskeletal pain.  When she had an episode was not associated with shortness of breath nausea or diaphoresis. Since her discharge she has had a CT and she now which was negative for pulmonary embolus. It did reveal the right lower lobe pulmonary nodule measuring 6 mm. There is no mention of coronary arteries.  Array so we'll follow her nodule. She is also discussed her allergies without Timothy Lasso she has had no wheezes.  This morning she found out about the lung nodule and then en route to our office they were almost hit by a truck she is quite anxious and by the end of our visit after I pressed on her chest she did develop some mild chest pressure.  I have instructed her if she has more than 2 episodes a week to call us. I did not give her prescription for nitroglycerin as in the hospital with sublingual nitroglycerin her blood pressure dropped to 80 and she had to be fluid resuscitated for pressure.   No Active Allergies  Current Outpatient Prescriptions  Medication Sig Dispense Refill  . acidophilus (RISAQUAD) CAPS Take 1 capsule by mouth every morning.       Marland Kitchen albuterol (PROVENTIL HFA;VENTOLIN HFA) 108 (90 BASE) MCG/ACT inhaler Inhale 2 puffs into the lungs every 6 (six) hours as needed for wheezing or shortness of breath.       Marland Kitchen aspirin 81 MG tablet Take 81 mg by mouth daily as needed for pain.      Marland Kitchen  atorvastatin (LIPITOR) 20 MG tablet Take 20 mg by mouth every evening.       . beclomethasone (QVAR) 80 MCG/ACT inhaler Inhale 2 puffs into the lungs 2 (two) times daily.      . cyclobenzaprine (FLEXERIL) 10 MG tablet Take 10 mg by mouth 3 (three) times daily as needed for muscle spasms.      . diclofenac (VOLTAREN) 75 MG EC tablet Take 1 tablet (75 mg total) by mouth 2 (two) times daily.  60 tablet  0  . diclofenac sodium (VOLTAREN) 1 % GEL Apply 2 g topically 4 (four) times daily as needed (applies to wrists).      . ferrous sulfate 325 (65 FE) MG tablet Take 325 mg by mouth 2 (two) times  daily.      . furosemide (LASIX) 20 MG tablet Take 20 mg by mouth daily.       . hydrOXYzine (ATARAX/VISTARIL) 25 MG tablet Take 25 mg by mouth every 8 (eight) hours as needed. For itching.      . loratadine (CLARITIN) 10 MG tablet Take 20 mg by mouth daily.       Marland Kitchen LORazepam (ATIVAN) 0.5 MG tablet Take 0.5 mg by mouth every 8 (eight) hours as needed for anxiety.       . mometasone (NASONEX) 50 MCG/ACT nasal spray Place 1 spray into the nose daily.       . Multiple Vitamin (MULITIVITAMIN WITH MINERALS) TABS Take 1 tablet by mouth daily.      . NON FORMULARY at bedtime. CPAP      . Olopatadine HCl 0.2 % SOLN Apply 1 drop to eye daily as needed. For dry eyes.      Marland Kitchen omeprazole (PRILOSEC) 20 MG capsule Take 20 mg by mouth 2 (two) times daily.       Marland Kitchen topiramate (TOPAMAX) 100 MG tablet Take 100 mg by mouth at bedtime.      . valsartan-hydrochlorothiazide (DIOVAN-HCT) 160-12.5 MG per tablet Take 1 tablet by mouth daily.      . vitamin B-12 (CYANOCOBALAMIN) 1000 MCG tablet Take 1,000 mcg by mouth daily.      Marland Kitchen zolpidem (AMBIEN) 10 MG tablet Take 10 mg by mouth at bedtime as needed. For sleep.       No current facility-administered medications for this visit.    Past Medical History  Diagnosis Date  . Hyperlipidemia   . GERD (gastroesophageal reflux disease)   . Headache(784.0)     migraine-like  . Degenerative disc disease   . History of UTI   . Sleep apnea     uses CPAP nightly  . Shoulder impingement 04/2012    left  . Asthma     daily and prn inhalers  . Hypertension     under control, has been on med. x 4-5 yrs.  . Anemia     takes iron supplement  . History of TIA (transient ischemic attack) early 2013    no weakness or deficits  . Partial tear of rotator cuff(726.13)   . Chest pain     negative Lexiscan myoview 03/12/2013  . Family history of early CAD   . History of echocardiogram 02/05/2010    EF > 55%; normal study  . History of nuclear stress test 11/03/2011    lexiscan;  normal pattern of perfusion; low risk scan; normal study     Past Surgical History  Procedure Laterality Date  . Umbilical hernia repair  03/28/2002  . Dilation and curettage of  uterus      2000  . Anterior cervical decomp/discectomy fusion  02/18/2010    C6-7  . Total knee arthroplasty  09/07/2009    left  . Total knee arthroplasty  10/13/2008    right  . Abdominal hysterectomy  03/28/2002    complete    ZOX:[id number]:UJ colds or fevers, no weight changes Skin:no rashes or ulcers HEENT:no blurred vision, no congestion CV:see HPI PUL:see HPI GI:no diarrhea constipation or melena, no indigestion GU:no hematuria, no dysuria MS:no joint pain, no claudication Neuro:no syncope, no lightheadedness Endo:no diabetes, no thyroid disease  PHYSICAL EXAM BP 130/70  Pulse 73  Ht 5\' 2"  (1.575 m)  Wt 278 lb 4.8 oz (126.236 kg)  BMI 50.89 kg/m2 General:Pleasant affect, NAD Skin:Warm and dry, brisk capillary refill HEENT:normocephalic, sclera clear, mucus membranes moist Neck:supple, no JVD, no bruits  Heart:S1S2 RRR without murmur, gallup, rub or click Lungs:clear without rales, rhonchi, or wheezes WJX:BJYNW, soft, non tender, + BS, do not palpate liver spleen or masses Ext:no lower ext edema, 2+ pedal pulses, 2+ radial pulses Neuro:alert and oriented, MAE, follows commands, + facial symmetry  EKG:SR no acute EKG changes.  ASSESSMENT AND PLAN Chest pain with low risk of acute coronary syndrome - most likely musculoskeletal, negative nuc study One episode of chest pain since discharge, not as severe.  I have asked her to call us if she has more than 2 episodes a week. She will follow up with Dr. Royann Shivers in 1 month.  If symptoms continue to occur cardiac cath may be necessary.    Family history of coronary artery disease Increases risk factor for CAD  Hypertension Under control  Lung nodule seen on imaging study, CTA of chest, 6 mm nodule, followed by Dr. Timothy Lasso CTA done for chest  pain.  No PE, lung nodule noted, Dr. Timothy Lasso following.  Pt was just notified.  Sleep apnea Continue use  Hyperlipidemia LDL controlled at 75 in hospital labs  Morbid obesity PCP following   No NTG secondary to hypotension in ER with SL NTG.

## 2013-03-25 NOTE — Assessment & Plan Note (Signed)
PCP following.

## 2013-03-25 NOTE — Assessment & Plan Note (Signed)
Under control 

## 2013-04-03 ENCOUNTER — Telehealth: Payer: Self-pay | Admitting: Cardiovascular Disease

## 2013-04-03 NOTE — Telephone Encounter (Signed)
Returning your call. °

## 2013-04-03 NOTE — Telephone Encounter (Signed)
Please call-still having migraine headache-Her headache doctor is concerned why her medicine was stopped.

## 2013-04-03 NOTE — Telephone Encounter (Signed)
Returned call.  Left message to call back before 4pm.  

## 2013-04-03 NOTE — Telephone Encounter (Signed)
I believe baclofen was stopped inadvertently, not purposefully. I do not think there is any cardiac reason that she cannot take this medication and she should resume it.

## 2013-04-03 NOTE — Telephone Encounter (Signed)
Returned call and informed pt per instructions by MD/PA.  Pt verbalized understanding and agreed w/ plan.  

## 2013-04-03 NOTE — Telephone Encounter (Signed)
Returned call.  Stated she spoke w/ Dr. Neale Burly and is having migraines again.  Stated when she saw L. Annie Paras, NP she stopped baclofen when she was in the hospital and started diclofenac.  Stated she was never given a reason for the change.  Pt wants to know if there is a reason for this change b/c diclofenac was helping her HAs and if Dr. Neale Burly (Headache Doctor) will be in the office tomorrow and his nurse wants to be able to tell him why it was stopped.  RN scanned notes from 6.30.14 and unable to locate information r/t why it was changed, but able to note it was changed.  Pt informed Dr. Royann Shivers will be notified for further instructions Sherian Rein, NP out of office this week).  Pt verbalized understanding and agreed w/ plan.  Message forwarded to Dr. Royann Shivers.

## 2013-04-09 ENCOUNTER — Ambulatory Visit
Admission: RE | Admit: 2013-04-09 | Discharge: 2013-04-09 | Disposition: A | Discharge status: Home / Self Care | Payer: 59 | Source: Ambulatory Visit

## 2013-04-09 ENCOUNTER — Other Ambulatory Visit (HOSPITAL_COMMUNITY): Payer: Self-pay | Admitting: Cardiology

## 2013-04-09 DIAGNOSIS — Z1231 Encounter for screening mammogram for malignant neoplasm of breast: Secondary | ICD-10-CM

## 2013-04-09 DIAGNOSIS — Z803 Family history of malignant neoplasm of breast: Secondary | ICD-10-CM

## 2013-04-11 ENCOUNTER — Other Ambulatory Visit: Payer: Self-pay | Admitting: Internal Medicine

## 2013-04-11 DIAGNOSIS — R928 Other abnormal and inconclusive findings on diagnostic imaging of breast: Secondary | ICD-10-CM

## 2013-04-17 ENCOUNTER — Other Ambulatory Visit: Payer: Self-pay | Admitting: Internal Medicine

## 2013-04-17 ENCOUNTER — Ambulatory Visit
Admission: RE | Admit: 2013-04-17 | Discharge: 2013-04-17 | Disposition: A | Discharge status: Home / Self Care | Payer: 59 | Source: Ambulatory Visit | Attending: Internal Medicine | Admitting: Internal Medicine

## 2013-04-17 DIAGNOSIS — R928 Other abnormal and inconclusive findings on diagnostic imaging of breast: Secondary | ICD-10-CM

## 2013-04-17 DIAGNOSIS — N6310 Unspecified lump in the right breast, unspecified quadrant: Secondary | ICD-10-CM

## 2013-04-25 ENCOUNTER — Ambulatory Visit
Admission: RE | Admit: 2013-04-25 | Discharge: 2013-04-25 | Disposition: A | Discharge status: Home / Self Care | Payer: 59 | Source: Ambulatory Visit | Attending: Internal Medicine | Admitting: Internal Medicine

## 2013-04-25 ENCOUNTER — Encounter: Payer: Self-pay | Admitting: Cardiovascular Disease

## 2013-04-25 ENCOUNTER — Ambulatory Visit (INDEPENDENT_AMBULATORY_CARE_PROVIDER_SITE_OTHER): Payer: 59 | Admitting: Cardiovascular Disease

## 2013-04-25 ENCOUNTER — Other Ambulatory Visit: Payer: Self-pay | Admitting: Internal Medicine

## 2013-04-25 VITALS — BP 134/88 | HR 74 | Resp 16 | Ht 62.0 in | Wt 273.7 lb

## 2013-04-25 DIAGNOSIS — R0989 Other specified symptoms and signs involving the circulatory and respiratory systems: Secondary | ICD-10-CM

## 2013-04-25 DIAGNOSIS — N6310 Unspecified lump in the right breast, unspecified quadrant: Secondary | ICD-10-CM

## 2013-04-25 DIAGNOSIS — R079 Chest pain, unspecified: Secondary | ICD-10-CM

## 2013-04-25 DIAGNOSIS — I10 Essential (primary) hypertension: Secondary | ICD-10-CM

## 2013-04-25 DIAGNOSIS — E785 Hyperlipidemia, unspecified: Secondary | ICD-10-CM

## 2013-04-25 DIAGNOSIS — G4730 Sleep apnea, unspecified: Secondary | ICD-10-CM

## 2013-04-25 DIAGNOSIS — Z8673 Personal history of transient ischemic attack (TIA), and cerebral infarction without residual deficits: Secondary | ICD-10-CM

## 2013-04-25 NOTE — Patient Instructions (Addendum)
Your physician recommends that you schedule a follow-up appointment in: 12 months.  

## 2013-04-28 NOTE — Assessment & Plan Note (Signed)
No longer bothers her.

## 2013-04-28 NOTE — Assessment & Plan Note (Signed)
No residual deficit or recurrence.No evidence of AF on previous monitor.

## 2013-04-28 NOTE — Assessment & Plan Note (Signed)
Satisfactory lipid parameters on recent testing.

## 2013-04-28 NOTE — Assessment & Plan Note (Signed)
This is a major target for future improvement.

## 2013-04-28 NOTE — Assessment & Plan Note (Signed)
Good control

## 2013-04-28 NOTE — Progress Notes (Signed)
Patient ID: [name], female   DOB: [birth year], 52 y.o.   MRN: [id number]      Reason for office visit Multiple coronary risk factors, morbid obesity, OSA  No recent cardiovascular complaints. Chest pain that led to nuclear stress test (normal results) last year no longer bothers her. A suspicious nodule was seen on her mammogram and breast US and she is to have a biopsy later today.   [employer], for whom she has worked for 32 years, has notified her that her job is to be eliminated soon.    No Known Allergies  Current Outpatient Prescriptions  Medication Sig Dispense Refill  . acidophilus (RISAQUAD) CAPS Take 1 capsule by mouth every morning.       Marland Kitchen albuterol (PROVENTIL HFA;VENTOLIN HFA) 108 (90 BASE) MCG/ACT inhaler Inhale 2 puffs into the lungs every 6 (six) hours as needed for wheezing or shortness of breath.       Marland Kitchen aspirin 81 MG tablet Take 81 mg by mouth daily as needed for pain.      Marland Kitchen atorvastatin (LIPITOR) 20 MG tablet Take 20 mg by mouth every evening.       . beclomethasone (QVAR) 80 MCG/ACT inhaler Inhale 2 puffs into the lungs 2 (two) times daily.      . cyclobenzaprine (FLEXERIL) 10 MG tablet Take 10 mg by mouth 3 (three) times daily as needed for muscle spasms.      . diclofenac (VOLTAREN) 75 MG EC tablet Take 1 tablet (75 mg total) by mouth 2 (two) times daily as needed.  60 tablet  0  . diclofenac sodium (VOLTAREN) 1 % GEL Apply 2 g topically 4 (four) times daily as needed (applies to wrists).      . ferrous sulfate 325 (65 FE) MG tablet Take 325 mg by mouth 2 (two) times daily.      . furosemide (LASIX) 20 MG tablet Take 20 mg by mouth daily.       . hydrOXYzine (ATARAX/VISTARIL) 25 MG tablet Take 25 mg by mouth every 8 (eight) hours as needed. For itching.      . loratadine (CLARITIN) 10 MG tablet Take 20 mg by mouth daily.       Marland Kitchen LORazepam (ATIVAN) 0.5 MG tablet Take 0.5 mg by mouth every 8 (eight) hours as needed for anxiety.       . mometasone  (NASONEX) 50 MCG/ACT nasal spray Place 1 spray into the nose daily.       . Multiple Vitamin (MULITIVITAMIN WITH MINERALS) TABS Take 1 tablet by mouth daily.      . NON FORMULARY at bedtime. CPAP      . Olopatadine HCl 0.2 % SOLN Apply 1 drop to eye daily as needed. For dry eyes.      Marland Kitchen omeprazole (PRILOSEC) 20 MG capsule Take 20 mg by mouth 2 (two) times daily.       Marland Kitchen topiramate (TOPAMAX) 100 MG tablet Take 100 mg by mouth at bedtime.      . valsartan-hydrochlorothiazide (DIOVAN-HCT) 160-12.5 MG per tablet Take 1 tablet by mouth daily.      Marland Kitchen zolpidem (AMBIEN) 10 MG tablet Take 10 mg by mouth at bedtime as needed. For sleep.      . vitamin B-12 (CYANOCOBALAMIN) 1000 MCG tablet Take 1,000 mcg by mouth daily.       No current facility-administered medications for this visit.    Past Medical History  Diagnosis Date  . Hyperlipidemia   . GERD (  gastroesophageal reflux disease)   . Headache(784.0)     migraine-like  . Degenerative disc disease   . History of UTI   . Sleep apnea     uses CPAP nightly  . Shoulder impingement 04/2012    left  . Asthma     daily and prn inhalers  . Hypertension     under control, has been on med. x 4-5 yrs.  . Anemia     takes iron supplement  . History of TIA (transient ischemic attack) early 2013    no weakness or deficits  . Partial tear of rotator cuff(726.13)   . Chest pain     negative Lexiscan myoview 03/12/2013  . Family history of early CAD   . History of echocardiogram 02/05/2010    EF > 55%; normal study  . History of nuclear stress test 11/03/2011    lexiscan; normal pattern of perfusion; low risk scan; normal study     Past Surgical History  Procedure Laterality Date  . Umbilical hernia repair  03/28/2002  . Dilation and curettage of uterus      2000  . Anterior cervical decomp/discectomy fusion  02/18/2010    C6-7  . Total knee arthroplasty  09/07/2009    left  . Total knee arthroplasty  10/13/2008    right  . Abdominal hysterectomy   03/28/2002    complete    Family History  Problem Relation Age of Onset  . Diabetes Mother   . Kidney disease Father   . Breast cancer Sister   . Diabetes Sister   . Coronary artery disease Sister   . Hypertension Mother   . Hypertension Father   . Diabetes Father   . Cancer Maternal Grandmother   . Diabetes Maternal Grandmother   . Hypertension Maternal Grandmother   . Diabetes Paternal Grandfather   . Cancer Paternal Grandfather     History   Social History  . Marital Status: Married    Spouse Name: N/A    Number of Children: 5  . Years of Education: N/A   Occupational History  . PROJECT ANALYST    Social History Main Topics  . Smoking status: Former Smoker    Quit date: 10/02/1999  . Smokeless tobacco: Never Used  . Alcohol Use: No  . Drug Use: No  . Sexual Activity: Yes    Birth Control/ Protection: None   Other Topics Concern  . Not on file   Social History Narrative  . No narrative on file    Review of systems: The patient specifically denies any chest pain at rest or with exertion, dyspnea at rest or with exertion, orthopnea, paroxysmal nocturnal dyspnea, syncope, palpitations, focal neurological deficits, intermittent claudication, lower extremity edema, unexplained weight gain, cough, hemoptysis or wheezing.  The patient also denies abdominal pain, nausea, vomiting, dysphagia, diarrhea, constipation, polyuria, polydipsia, dysuria, hematuria, frequency, urgency, abnormal bleeding or bruising, fever, chills, unexpected weight changes, mood swings, change in skin or hair texture, change in voice quality, auditory or visual problems, allergic reactions or rashes, new musculoskeletal complaints other than usual "aches and pains".   PHYSICAL EXAM BP 134/88  Pulse 74  Resp 16  Ht 5\' 2"  (1.575 m)  Wt 273 lb 11.2 oz (124.15 kg)  BMI 50.05 kg/m2  General: Alert, oriented x3, no distress Head: no evidence of trauma, PERRL, EOMI, no exophtalmos or lid lag,  no myxedema, no xanthelasma; normal ears, nose and oropharynx Neck: normal jugular venous pulsations and no hepatojugular reflux; brisk  carotid pulses without delay and no carotid bruits Chest: clear to auscultation, no signs of consolidation by percussion or palpation, normal fremitus, symmetrical and full respiratory excursions Cardiovascular: normal position and quality of the apical impulse, regular rhythm, normal first and second heart sounds, no murmurs, rubs or gallops Abdomen: no tenderness or distention, no masses by palpation, no abnormal pulsatility or arterial bruits, normal bowel sounds, no hepatosplenomegaly Extremities: no clubbing, cyanosis or edema; 2+ radial, ulnar and brachial pulses bilaterally; 2+ right femoral, posterior tibial and dorsalis pedis pulses; 2+ left femoral, posterior tibial and dorsalis pedis pulses; no subclavian or femoral bruits Neurological: grossly nonfocal   EKG: NSR, normal.  Lipid Panel     Component Value Date/Time   CHOL 143 03/12/2013 0200   TRIG 128 03/12/2013 0200   HDL 42 03/12/2013 0200   CHOLHDL 3.4 03/12/2013 0200   VLDL 26 03/12/2013 0200   LDLCALC 75 03/12/2013 0200    BMET    Component Value Date/Time   NA 138 03/12/2013 0200   K 3.2* 03/12/2013 0200   CL 102 03/12/2013 0200   CO2 27 03/12/2013 0200   GLUCOSE 124* 03/12/2013 0200   BUN 11 03/12/2013 0200   CREATININE 0.81 03/12/2013 0200   CALCIUM 9.4 03/12/2013 0200   GFRNONAA 83* 03/12/2013 0200   GFRAA >90 03/12/2013 0200     ASSESSMENT AND PLAN History of TIA (transient ischemic attack) 9/13 No residual deficit or recurrence.No evidence of AF on previous monitor.  Hyperlipidemia Satisfactory lipid parameters on recent testing.  Sleep apnea CPAP compliant.  Hypertension Good control.  Morbid obesity This is a major target for future improvement.  Chest pain with low risk of acute coronary syndrome - most likely musculoskeletal, negative nuc study No longer bothers her.  Orders Placed  This Encounter  Procedures  . Carotid duplex   No orders of the defined types were placed in this encounter.    Junious Silk, MD, [facility] [phone] office [phone] pager

## 2013-04-28 NOTE — Assessment & Plan Note (Signed)
CPAP compliant.  

## 2013-04-30 ENCOUNTER — Telehealth: Payer: Self-pay | Admitting: Cardiovascular Disease

## 2013-04-30 NOTE — Telephone Encounter (Signed)
Per Britta Mccreedy, CMA, will review w/ Dr. Salena Saner tomorrow.

## 2013-04-30 NOTE — Telephone Encounter (Signed)
Patient does not know why she is having carotid doppler on Thursday 05/02/13.   Dr. Royann Shivers did not mention anything to her about this on her last visit.

## 2013-05-02 ENCOUNTER — Encounter (HOSPITAL_COMMUNITY): Payer: 59

## 2013-05-02 ENCOUNTER — Other Ambulatory Visit: Payer: Self-pay | Admitting: *Deleted

## 2013-05-02 NOTE — Telephone Encounter (Signed)
Per Dr. Salena Saner patient does not need the Carotid Doppler study, ordered in error. Patient notified and order canceled.

## 2013-09-12 HISTORY — PX: FOOT SURGERY: SHX648

## 2013-09-20 ENCOUNTER — Other Ambulatory Visit: Payer: Self-pay | Admitting: Internal Medicine

## 2013-09-20 DIAGNOSIS — N63 Unspecified lump in unspecified breast: Secondary | ICD-10-CM

## 2013-10-01 ENCOUNTER — Telehealth: Payer: Self-pay | Admitting: *Deleted

## 2013-10-01 NOTE — Telephone Encounter (Signed)
Faxed Sleep Order along with sleep studies, insurance and demographic information to [facility]. Patient notified that referral has been done.

## 2013-10-28 ENCOUNTER — Ambulatory Visit
Admission: RE | Admit: 2013-10-28 | Discharge: 2013-10-28 | Disposition: A | Discharge status: Home / Self Care | Payer: 59 | Source: Ambulatory Visit | Attending: Internal Medicine | Admitting: Internal Medicine

## 2013-10-28 ENCOUNTER — Other Ambulatory Visit: Payer: Self-pay | Admitting: Internal Medicine

## 2013-10-28 DIAGNOSIS — N63 Unspecified lump in unspecified breast: Secondary | ICD-10-CM

## 2013-11-13 ENCOUNTER — Telehealth: Payer: Self-pay | Admitting: *Deleted

## 2013-11-13 NOTE — Telephone Encounter (Signed)
Patient called to request pressure setting order to be sent to [facility] for the CPAP machine that she is using at home that previously belonged to her husband. Per Anderson Malta at [facility] it is okay for her to use that machine until hers gets fixed. She just needs order for pressure setting. Per Anderson Malta patient pressure has been @ 13 CmH20.  I will send order for range of 5-20 CmH20.

## 2014-01-27 ENCOUNTER — Other Ambulatory Visit: Payer: Self-pay | Admitting: Physical Medicine and Rehabilitation

## 2014-01-27 DIAGNOSIS — M7918 Myalgia, other site: Secondary | ICD-10-CM

## 2014-01-27 DIAGNOSIS — M545 Low back pain, unspecified: Secondary | ICD-10-CM

## 2014-01-29 ENCOUNTER — Telehealth: Payer: Self-pay | Admitting: Cardiovascular Disease

## 2014-01-30 NOTE — Telephone Encounter (Signed)
Closed encounter °

## 2014-02-01 ENCOUNTER — Ambulatory Visit
Admission: RE | Admit: 2014-02-01 | Discharge: 2014-02-01 | Disposition: A | Discharge status: Home / Self Care | Payer: 59 | Source: Ambulatory Visit | Attending: Physical Medicine and Rehabilitation | Admitting: Physical Medicine and Rehabilitation

## 2014-02-01 DIAGNOSIS — M545 Low back pain, unspecified: Secondary | ICD-10-CM

## 2014-02-01 DIAGNOSIS — M7918 Myalgia, other site: Secondary | ICD-10-CM

## 2014-02-17 ENCOUNTER — Other Ambulatory Visit: Payer: Self-pay | Admitting: Internal Medicine

## 2014-02-17 DIAGNOSIS — N63 Unspecified lump in unspecified breast: Secondary | ICD-10-CM

## 2014-03-11 ENCOUNTER — Telehealth: Payer: Self-pay | Admitting: *Deleted

## 2014-03-11 DIAGNOSIS — Z0289 Encounter for other administrative examinations: Secondary | ICD-10-CM

## 2014-03-11 NOTE — Telephone Encounter (Signed)
Faxed CPAP supply order back to [facility].

## 2014-03-19 ENCOUNTER — Other Ambulatory Visit: Payer: Self-pay | Admitting: Internal Medicine

## 2014-03-19 DIAGNOSIS — R911 Solitary pulmonary nodule: Secondary | ICD-10-CM

## 2014-03-20 ENCOUNTER — Telehealth: Payer: Self-pay | Admitting: *Deleted

## 2014-03-20 NOTE — Telephone Encounter (Signed)
Returned CPAP supply order to [facility]. 

## 2014-03-25 ENCOUNTER — Ambulatory Visit
Admission: RE | Admit: 2014-03-25 | Discharge: 2014-03-25 | Disposition: A | Discharge status: Home / Self Care | Payer: 59 | Source: Ambulatory Visit | Attending: Internal Medicine | Admitting: Internal Medicine

## 2014-03-25 DIAGNOSIS — R911 Solitary pulmonary nodule: Secondary | ICD-10-CM

## 2014-04-10 ENCOUNTER — Ambulatory Visit
Admission: RE | Admit: 2014-04-10 | Discharge: 2014-04-10 | Disposition: A | Discharge status: Home / Self Care | Payer: 59 | Source: Ambulatory Visit | Attending: Internal Medicine | Admitting: Internal Medicine

## 2014-04-10 DIAGNOSIS — N63 Unspecified lump in unspecified breast: Secondary | ICD-10-CM

## 2014-04-28 ENCOUNTER — Encounter: Payer: Self-pay | Admitting: Cardiovascular Disease

## 2014-04-28 ENCOUNTER — Ambulatory Visit (INDEPENDENT_AMBULATORY_CARE_PROVIDER_SITE_OTHER): Payer: 59 | Admitting: Cardiovascular Disease

## 2014-04-28 VITALS — BP 130/78 | HR 82 | Resp 16 | Ht 62.0 in | Wt 279.0 lb

## 2014-04-28 DIAGNOSIS — I10 Essential (primary) hypertension: Secondary | ICD-10-CM

## 2014-04-28 DIAGNOSIS — E785 Hyperlipidemia, unspecified: Secondary | ICD-10-CM

## 2014-04-28 DIAGNOSIS — Z8673 Personal history of transient ischemic attack (TIA), and cerebral infarction without residual deficits: Secondary | ICD-10-CM

## 2014-04-28 DIAGNOSIS — R0602 Shortness of breath: Secondary | ICD-10-CM

## 2014-04-28 DIAGNOSIS — G4730 Sleep apnea, unspecified: Secondary | ICD-10-CM

## 2014-04-28 NOTE — Progress Notes (Signed)
Patient ID: [name], female   DOB: 14-Sep-1960, 53 y.o.   MRN: [id number]     Reason for office visit History of TIA, Multiple coronary risk factors, morbid obesity, OSA  [name] is a 53 year old woman with a remote history of transient ischemic attack of uncertain etiology, hypertension, hyperlipidemia, recently diagnosed type 2 diabetes mellitus in the setting of morbid obesity and obstructive sleep apnea. Workup for her breast nodule last year seems to be pointing to a benign etiology. She is seeking medical disability for multiple orthopedic problems. She had labs drawn by her primary care physician and reports that her cholesterol is "okay, but that her hemoglobin A1c was greater than 70%. She has gained 6 pounds since last year and is morbidly obese  She has no pressing cardiovascular complaints. She reports 100% compliance with CPAP. She denies palpitations. She occasionally becomes short of breath, usually when she bends over. She has rare spells of dizziness. She has not had lower extremity edema. The greatest limiting factor to functional performance is knee and sacroiliac joint problems.   No Known Allergies  Current Outpatient Prescriptions  Medication Sig Dispense Refill  . acidophilus (RISAQUAD) CAPS Take 1 capsule by mouth every morning.       Marland Kitchen aspirin 81 MG tablet Take 81 mg by mouth daily as needed for pain.      Marland Kitchen atorvastatin (LIPITOR) 20 MG tablet Take 20 mg by mouth every evening.       . beclomethasone (QVAR) 80 MCG/ACT inhaler Inhale 2 puffs into the lungs 2 (two) times daily.      . cyclobenzaprine (FLEXERIL) 10 MG tablet Take 10 mg by mouth 3 (three) times daily as needed for muscle spasms.      . diclofenac (VOLTAREN) 75 MG EC tablet Take 75 mg by mouth 2 (two) times daily as needed. 35 mg twice daily      . diclofenac sodium (VOLTAREN) 1 % GEL Apply 2 g topically 4 (four) times daily as needed (applies to wrists).      . ferrous sulfate 325 (65 FE) MG  tablet Take 325 mg by mouth 2 (two) times daily.      . furosemide (LASIX) 20 MG tablet Take 20 mg by mouth daily.       . hydrOXYzine (ATARAX/VISTARIL) 25 MG tablet Take 25 mg by mouth every 8 (eight) hours as needed. For itching.      . loratadine (CLARITIN) 10 MG tablet Take 20 mg by mouth daily.       Marland Kitchen LORazepam (ATIVAN) 0.5 MG tablet Take 0.5 mg by mouth every 8 (eight) hours as needed for anxiety.       . metFORMIN (GLUCOPHAGE) 500 MG tablet 1/2 pill 250 mg daily      . mometasone (NASONEX) 50 MCG/ACT nasal spray Place 1 spray into the nose daily.       . Multiple Vitamin (MULITIVITAMIN WITH MINERALS) TABS Take 1 tablet by mouth daily.      . NON FORMULARY at bedtime. CPAP      . Olopatadine HCl 0.2 % SOLN Apply 1 drop to eye daily as needed. For dry eyes.      Marland Kitchen omeprazole (PRILOSEC) 20 MG capsule Take 20 mg by mouth 2 (two) times daily.       Marland Kitchen topiramate (TOPAMAX) 100 MG tablet Take 100 mg by mouth at bedtime.      . traMADol (ULTRAM) 50 MG tablet       .  valsartan-hydrochlorothiazide (DIOVAN-HCT) 160-12.5 MG per tablet Take 1 tablet by mouth daily.      . vitamin B-12 (CYANOCOBALAMIN) 1000 MCG tablet Take 1,000 mcg by mouth daily.      Marland Kitchen zolpidem (AMBIEN) 10 MG tablet Take 10 mg by mouth at bedtime as needed. For sleep.       No current facility-administered medications for this visit.    Past Medical History  Diagnosis Date  . Hyperlipidemia   . GERD (gastroesophageal reflux disease)   . Headache(784.0)     migraine-like  . Degenerative disc disease   . History of UTI   . Sleep apnea     uses CPAP nightly  . Shoulder impingement 04/2012    left  . Asthma     daily and prn inhalers  . Hypertension     under control, has been on med. x 4-5 yrs.  . Anemia     takes iron supplement  . History of TIA (transient ischemic attack) early 2013    no weakness or deficits  . Partial tear of rotator cuff(726.13)   . Chest pain     negative Lexiscan myoview 03/12/2013  . Family  history of early CAD   . History of echocardiogram 02/05/2010    EF > 55%; normal study  . History of nuclear stress test 11/03/2011    lexiscan; normal pattern of perfusion; low risk scan; normal study     Past Surgical History  Procedure Laterality Date  . Umbilical hernia repair  03/28/2002  . Dilation and curettage of uterus      2000  . Anterior cervical decomp/discectomy fusion  02/18/2010    C6-7  . Total knee arthroplasty  09/07/2009    left  . Total knee arthroplasty  10/13/2008    right  . Abdominal hysterectomy  03/28/2002    complete    Family History  Problem Relation Age of Onset  . Diabetes Mother   . Kidney disease Father   . Breast cancer Sister   . Diabetes Sister   . Coronary artery disease Sister   . Hypertension Mother   . Hypertension Father   . Diabetes Father   . Cancer Maternal Grandmother   . Diabetes Maternal Grandmother   . Hypertension Maternal Grandmother   . Diabetes Paternal Grandfather   . Cancer Paternal Grandfather     History   Social History  . Marital Status: Married    Spouse Name: N/A    Number of Children: 103  . Years of Education: N/A   Occupational History  . PROJECT ANALYST    Social History Main Topics  . Smoking status: Former Smoker    Quit date: 10/02/1999  . Smokeless tobacco: Never Used  . Alcohol Use: No  . Drug Use: No  . Sexual Activity: Yes    Birth Control/ Protection: None   Other Topics Concern  . Not on file   Social History Narrative  . No narrative on file    Review of systems: The patient specifically denies any chest pain at rest or with exertion, dyspnea at rest or with exertion, orthopnea, paroxysmal nocturnal dyspnea, syncope, palpitations, focal neurological deficits, intermittent claudication, lower extremity edema, unexplained weight gain, cough, hemoptysis or wheezing.  The patient also denies abdominal pain, nausea, vomiting, dysphagia, diarrhea, constipation, polyuria, polydipsia,  dysuria, hematuria, frequency, urgency, abnormal bleeding or bruising, fever, chills, unexpected weight changes, mood swings, change in skin or hair texture, change in voice quality, auditory or visual problems,  allergic reactions or rashes, new musculoskeletal complaints other than usual "aches and pains".   PHYSICAL EXAM BP 130/78  Pulse 82  Resp 16  Ht 5\' 2"  (1.575 m)  Wt 279 lb (126.554 kg)  BMI 51.02 kg/m2 Physical exam is significantly limited by her super morbid obesity General: Alert, oriented x3, no distress  Head: no evidence of trauma, PERRL, EOMI, no exophtalmos or lid lag, no myxedema, no xanthelasma; normal ears, nose and oropharynx  Neck: normal jugular venous pulsations and no hepatojugular reflux; brisk carotid pulses without delay and no carotid bruits , acantosis Chest: clear to auscultation, no signs of consolidation by percussion or palpation, normal fremitus, symmetrical and full respiratory excursions  Cardiovascular: Cannot identify the apical impulse, regular rhythm, normal first and second heart sounds, no murmurs, rubs or gallops  Abdomen: no tenderness or distention, no masses by palpation, no abnormal pulsatility or arterial bruits, normal bowel sounds, no hepatosplenomegaly  Extremities: no clubbing, cyanosis or edema; 2+ radial, ulnar and brachial pulses bilaterally; 2+ right femoral, posterior tibial and dorsalis pedis pulses; 2+ left femoral, posterior tibial and dorsalis pedis pulses; no subclavian or femoral bruits  Neurological: grossly nonfocal   EKG: NSR, normal.   Lipid Panel     Component Value Date/Time   CHOL 143 03/12/2013 0200   TRIG 128 03/12/2013 0200   HDL 42 03/12/2013 0200   CHOLHDL 3.4 03/12/2013 0200   VLDL 26 03/12/2013 0200   LDLCALC 75 03/12/2013 0200    BMET    Component Value Date/Time   NA 138 03/12/2013 0200   K 3.2* 03/12/2013 0200   CL 102 03/12/2013 0200   CO2 27 03/12/2013 0200   GLUCOSE 124* 03/12/2013 0200   BUN 11 03/12/2013 0200    CREATININE 0.81 03/12/2013 0200   CALCIUM 9.4 03/12/2013 0200   GFRNONAA 83* 03/12/2013 0200   GFRAA >90 03/12/2013 0200     ASSESSMENT AND PLAN  History of TIA (transient ischemic attack) 9/13  No residual deficit or recurrence.No evidence of AF on previous monitor.  Hyperlipidemia  Reportedly satisfactory lipid parameters on recent testing.  Sleep apnea  CPAP compliant.  Hypertension  Good control.  Morbid obesity  This is a major of many of her medical problems including shortness of breath, obstructive sleep apnea, diabetes mellitus, hypertension and even her knee and sacroiliac complaints  Orders Placed This Encounter  Procedures  . EKG 12-Lead   Meds ordered this encounter  Medications  . metFORMIN (GLUCOPHAGE) 500 MG tablet    Sig: 1/2 pill 250 mg daily  . traMADol (ULTRAM) 50 MG tablet    Sig:   . diclofenac (VOLTAREN) 75 MG EC tablet    Sig: Take 75 mg by mouth 2 (two) times daily as needed. 35 mg twice daily    Li Fragoso  Sanda Klein, MD, [facility] [phone] office [phone] pager

## 2014-04-28 NOTE — Patient Instructions (Signed)
Dr. Croitoru recommends that you schedule a follow-up appointment in: One year.   

## 2014-04-30 ENCOUNTER — Ambulatory Visit: Payer: Self-pay | Admitting: Podiatry

## 2014-05-01 ENCOUNTER — Encounter: Payer: Self-pay | Admitting: Podiatry

## 2014-05-01 ENCOUNTER — Ambulatory Visit (INDEPENDENT_AMBULATORY_CARE_PROVIDER_SITE_OTHER): Payer: 59 | Admitting: Podiatry

## 2014-05-01 ENCOUNTER — Ambulatory Visit (INDEPENDENT_AMBULATORY_CARE_PROVIDER_SITE_OTHER): Payer: 59

## 2014-05-01 VITALS — BP 111/69 | HR 96 | Resp 16 | Ht 62.0 in | Wt 279.0 lb

## 2014-05-01 DIAGNOSIS — M6740 Ganglion, unspecified site: Secondary | ICD-10-CM

## 2014-05-01 DIAGNOSIS — M7750 Other enthesopathy of unspecified foot: Secondary | ICD-10-CM

## 2014-05-01 MED ORDER — TRIAMCINOLONE ACETONIDE 10 MG/ML IJ SUSP
10.0000 mg | Freq: Once | INTRAMUSCULAR | Status: AC
  Administered 2014-05-01: 10 mg

## 2014-05-01 NOTE — Progress Notes (Signed)
   Subjective:    Patient ID: [name], female    DOB: 1960-11-28, 53 y.o.   MRN: [id number]  HPI Comments: A knot on the side of the right foot lateral side, its been 3 weeks since i noticed it. It started out real sore and tender . Now its really sore.  Diabetic just diagnosed in July. A1c was around 7.4   Foot Pain      Review of Systems  Endocrine:       Diabetes   All other systems reviewed and are negative.      Objective:   Physical Exam        Assessment & Plan:

## 2014-05-01 NOTE — Progress Notes (Signed)
Subjective:     Patient ID: [name], female   DOB: 06/15/1961, 53 y.o.   MRN: [id number]  Foot Pain   patient states that she noticed a little nodule on the outside of her right foot a few weeks ago and it's become painful and she has trouble wearing shoe gear tight against   Review of Systems  All other systems reviewed and are negative.      Objective:   Physical Exam  Nursing note and vitals reviewed. Constitutional: She is oriented to person, place, and time.  Cardiovascular: Intact distal pulses.   Musculoskeletal: Normal range of motion.  Neurological: She is oriented to person, place, and time.  Skin: Skin is warm.   neurovascular status found to be intact with muscle strength adequate and range of motion subtalar and midtarsal joint within normal limits. Patient is found to have good perfused digits and I noted on the right foot there is a small nodule within subcutaneous tissue which is freely movable and appears to be localized in its nature.     Assessment:     Possible ganglionic cyst or possible fibrous small mass of the lateral right dorsal foot    Plan:     H&P and x-ray reviewed and today I carefully injected under this with 3 mg Kenalog Xylocaine mixture and we will see her back in a few weeks and if it remains or is tender or has not shrunk we will have to consider excision of the mass

## 2014-05-22 ENCOUNTER — Ambulatory Visit (INDEPENDENT_AMBULATORY_CARE_PROVIDER_SITE_OTHER): Payer: 59 | Admitting: Podiatry

## 2014-05-22 ENCOUNTER — Encounter: Payer: Self-pay | Admitting: Podiatry

## 2014-05-22 VITALS — BP 124/82 | HR 66 | Resp 16

## 2014-05-22 DIAGNOSIS — M6740 Ganglion, unspecified site: Secondary | ICD-10-CM

## 2014-05-22 DIAGNOSIS — D492 Neoplasm of unspecified behavior of bone, soft tissue, and skin: Secondary | ICD-10-CM

## 2014-05-23 ENCOUNTER — Encounter: Payer: Self-pay | Admitting: Podiatry

## 2014-05-23 NOTE — Progress Notes (Signed)
Subjective:     Patient ID: [name], female   DOB: Oct 16, 1960, 54 y.o.   MRN: [id number]  HPI patient states that this spot is really bothering me on my right foot and I need to get it taken out as it did not respond well to conservative care   Review of Systems     Objective:   Physical Exam Neurovascular status intact with muscle strength adequate and range of motion within normal limits and noted to have a small nodule on the lateral side of the right forefoot that is freely movable and painful when pressed    Assessment:     Soft tissue tumor possible ganglionic cyst it did not respond to conservative care    Plan:     Reviewed condition with patient and discussed treatment options. Patient wants it removed and I recommended surgical intervention with removal of mass. Patient was given a consent form explaining procedure and will have this done next week after reviewing what would be required. Patient understands all complications and is scheduled for outpatient surgery next week with pathology which will be done on this lesion

## 2014-05-26 ENCOUNTER — Telehealth: Payer: Self-pay | Admitting: *Deleted

## 2014-05-26 NOTE — Telephone Encounter (Signed)
I'm scheduled to have surgery on Tuesday.  I just want to confirm with the 81mg  Aspirin when do I need to stop taking it daily or will I need to stop taking it.  I asked the nurse from the surgical center and she recommended I call your office.

## 2014-05-26 NOTE — Telephone Encounter (Signed)
I called and left her a message that Dr. Paulla Dolly said it's okay not stopping the aspirin.  It's just a small procedure it will be okay.

## 2014-05-27 ENCOUNTER — Encounter: Payer: Self-pay | Admitting: Podiatry

## 2014-05-27 DIAGNOSIS — D492 Neoplasm of unspecified behavior of bone, soft tissue, and skin: Secondary | ICD-10-CM

## 2014-05-27 DIAGNOSIS — M6740 Ganglion, unspecified site: Secondary | ICD-10-CM

## 2014-05-29 NOTE — Progress Notes (Signed)
Dr Paulla Dolly performed a right exc of ganglion tumor on 05/27/14

## 2014-06-02 ENCOUNTER — Ambulatory Visit (INDEPENDENT_AMBULATORY_CARE_PROVIDER_SITE_OTHER): Payer: 59

## 2014-06-02 ENCOUNTER — Ambulatory Visit (INDEPENDENT_AMBULATORY_CARE_PROVIDER_SITE_OTHER): Payer: 59 | Admitting: Podiatry

## 2014-06-02 DIAGNOSIS — D492 Neoplasm of unspecified behavior of bone, soft tissue, and skin: Secondary | ICD-10-CM

## 2014-06-02 DIAGNOSIS — Z9889 Other specified postprocedural states: Secondary | ICD-10-CM

## 2014-06-03 NOTE — Progress Notes (Signed)
Subjective:     Patient ID: [name], female   DOB: 1961/04/12, 53 y.o.   MRN: [id number]  HPI patient presents 1 week after having removal of mass right foot stating she's doing great with no pain   Review of Systems     Objective:   Physical Exam Neurovascular status intact with well-healing surgical site lateral side right foot    Assessment:     Doing well postop removal of soft tissue mass right foot probable ganglionic cyst    Plan:     Reapplied sterile dressing and compression and advised on continued surgical shoe for 2 more weeks and we will await for final results of pathology

## 2014-06-06 ENCOUNTER — Telehealth: Payer: Self-pay | Admitting: *Deleted

## 2014-06-06 MED ORDER — CEPHALEXIN 500 MG PO CAPS: 500.0000 mg | ORAL_CAPSULE | Freq: Three times a day (TID) | ORAL | Status: DC

## 2014-06-06 MED ORDER — MUPIROCIN 2 % EX OINT: 1.0000 "application " | TOPICAL_OINTMENT | Freq: Every day | CUTANEOUS | Status: DC

## 2014-06-06 NOTE — Telephone Encounter (Signed)
I have a spot at the incision area towards the ankle that has a white patch, feels like heat is coming out of it, looks like it has separated a little bit and sore to touch.  I told her I would contact Dr. Paulla Dolly and see what he suggests and give her a call back.  I asked her what pharmacy she uses.  She stated [facility]

## 2014-06-06 NOTE — Telephone Encounter (Signed)
Call in keflex 500mg  1 po tid x 30 pills.  And mupirocin ointment 22gram-- apply daily and cover.  Ask which pharmacy-- there are 2 listed.. One in [place], one in [place].  Thanks!

## 2014-06-06 NOTE — Telephone Encounter (Signed)
Dr. Paulla Dolly is out of office will ask Dr. Valentina Lucks.

## 2014-06-06 NOTE — Telephone Encounter (Signed)
I called and informed her Dr. Valentina Lucks is prescribing antibiotic and antibiotic ointment.  I am sending it to your pharmacy.  She stated thank you.  Prescriptions were e-scribed.

## 2014-06-20 ENCOUNTER — Encounter: Payer: Self-pay | Admitting: Podiatry

## 2014-07-07 ENCOUNTER — Encounter: Payer: Self-pay | Admitting: Podiatry

## 2014-07-31 ENCOUNTER — Encounter: Payer: Self-pay | Admitting: Cardiovascular Disease

## 2014-07-31 ENCOUNTER — Ambulatory Visit (INDEPENDENT_AMBULATORY_CARE_PROVIDER_SITE_OTHER): Payer: 59 | Admitting: Cardiovascular Disease

## 2014-07-31 VITALS — BP 114/66 | HR 109 | Ht 62.0 in | Wt 274.1 lb

## 2014-07-31 DIAGNOSIS — I10 Essential (primary) hypertension: Secondary | ICD-10-CM

## 2014-07-31 DIAGNOSIS — G4730 Sleep apnea, unspecified: Secondary | ICD-10-CM

## 2014-07-31 DIAGNOSIS — Z8249 Family history of ischemic heart disease and other diseases of the circulatory system: Secondary | ICD-10-CM

## 2014-07-31 DIAGNOSIS — E785 Hyperlipidemia, unspecified: Secondary | ICD-10-CM

## 2014-07-31 NOTE — Patient Instructions (Signed)
Your physician recommends that you schedule a follow-up appointment as needed with Dr. Claiborne Billings for your sleep therapy.

## 2014-08-02 ENCOUNTER — Encounter: Payer: Self-pay | Admitting: Cardiovascular Disease

## 2014-08-02 NOTE — Progress Notes (Addendum)
Patient ID: [name], female   DOB: May 12, 1961, 53 y.o.   MRN: [id number]     HPI: [name], is a 53 y.o. female who is followed by Dr. Sallyanne Kuster.  She presents for sleep clinic for follow-up of her obstructive sleep apnea on CPAP therapy.  [name] obesity, hypertension, hyperlipidemia, as well as remote TIA.  In June 2012.  She was diagnosed with sleep apnea in her AHI was 8.9 per hour.  In light of her significant cardiovascular comorbidities .  She was referred for CPAP titration and was titrated up to 13 cm water pressure with excellent benefit..  She initiated CPAP therapy and has been on CPAP ever since.  In July 2015, she received a new machine now after her prior machine malfunction.  She now has a [employer] air sent 10 AutoSet unit.  She is using a [employer] full facemask (small).  A download from 06/29/2014 through 07/28/2014 reveals 100% compliance.  She had 97% of days with greater than 4 hours use.  She was averaging 7 hours and 39 minutes.  She is set at a 13 7 m water pressure and her HI is excellent at 0.8.  She goes to bed between 7:30 and 8 AM.  She wakes up at 2:00 and often stays awake until 4am and then doses back to bed.  She states that she has had this sleep pattern since 01-09-06 when her father passed away.  She is unaware of breakthrough snoring with her new machine.  She denies residual daytime sleepiness.  There is no bruxism.  She denies painful restless legs.  An Epworth Sleepiness Scale score was calculated today and this endorsed at 6 as noted below.     Epworth Sleepiness Scale: Situation   Chance of Dozing/Sleeping (0 = never , 1 = slight chance , 2 = moderate chance , 3 = high chance )   sitting and reading 2   watching TV 1   sitting inactive in a public place 1   being a passenger in a motor vehicle for an hour or more 0   lying down in the afternoon 1   sitting and talking to someone 0   sitting quietly after lunch (no alcohol) 1     while stopped for a few minutes in traffic as the driver 0   Total Score  6    Past Medical History  Diagnosis Date  . Hyperlipidemia   . GERD (gastroesophageal reflux disease)   . Headache(784.0)     migraine-like  . Degenerative disc disease   . History of UTI   . Sleep apnea     uses CPAP nightly  . Shoulder impingement 04/2012    left  . Asthma     daily and prn inhalers  . Hypertension     under control, has been on med. x 4-5 yrs.  . Anemia     takes iron supplement  . History of TIA (transient ischemic attack) early 2012/01/10    no weakness or deficits  . Partial tear of rotator cuff(726.13)   . Chest pain     negative Lexiscan myoview 03/12/2013  . Family history of early CAD   . History of echocardiogram 02/05/2010    EF > 55%; normal study  . History of nuclear stress test 11/03/2011    lexiscan; normal pattern of perfusion; low risk scan; normal study     Past Surgical History  Procedure Laterality Date  .  Umbilical hernia repair  03/28/2002  . Dilation and curettage of uterus      2000  . Anterior cervical decomp/discectomy fusion  02/18/2010    C6-7  . Total knee arthroplasty  09/07/2009    left  . Total knee arthroplasty  10/13/2008    right  . Abdominal hysterectomy  03/28/2002    complete    No Known Allergies  Current Outpatient Prescriptions  Medication Sig Dispense Refill  . acidophilus (RISAQUAD) CAPS Take 1 capsule by mouth every morning.     Marland Kitchen aspirin 81 MG tablet Take 81 mg by mouth daily as needed for pain.    Marland Kitchen atorvastatin (LIPITOR) 20 MG tablet Take 20 mg by mouth every evening.     . beclomethasone (QVAR) 80 MCG/ACT inhaler Inhale 2 puffs into the lungs 2 (two) times daily.    . cyclobenzaprine (FLEXERIL) 10 MG tablet Take 10 mg by mouth 3 (three) times daily as needed for muscle spasms.    . diclofenac sodium (VOLTAREN) 1 % GEL Apply 2 g topically 4 (four) times daily as needed (applies to wrists).    . ferrous sulfate 325 (65 FE) MG tablet  Take 325 mg by mouth 4 (four) times a week. Sunday, Tuesday, Thursday, and saturday    . furosemide (LASIX) 20 MG tablet Take 20 mg by mouth daily.     . hydrOXYzine (ATARAX/VISTARIL) 25 MG tablet Take 25 mg by mouth every 8 (eight) hours as needed. For itching.    . loratadine (CLARITIN) 10 MG tablet Take 20 mg by mouth daily.     Marland Kitchen LORazepam (ATIVAN) 0.5 MG tablet Take 0.5 mg by mouth every 8 (eight) hours as needed for anxiety.     . metFORMIN (GLUCOPHAGE) 500 MG tablet 500 mg.     . mometasone (NASONEX) 50 MCG/ACT nasal spray Place 1 spray into the nose daily.     . Multiple Vitamin (MULITIVITAMIN WITH MINERALS) TABS Take 1 tablet by mouth daily.    . mupirocin ointment (BACTROBAN) 2 % Apply 1 application topically daily. And cover 22 g 0  . NON FORMULARY at bedtime. CPAP    . Olopatadine HCl 0.2 % SOLN Apply 1 drop to eye daily as needed. For dry eyes.    Marland Kitchen omeprazole (PRILOSEC) 20 MG capsule Take 20 mg by mouth 2 (two) times daily.     . ONE TOUCH ULTRA TEST test strip     . ONETOUCH DELICA LANCETS 69G MISC     . topiramate (TOPAMAX) 100 MG tablet Take 150 mg by mouth at bedtime.     . traMADol (ULTRAM) 50 MG tablet     . valsartan-hydrochlorothiazide (DIOVAN-HCT) 160-12.5 MG per tablet Take 1 tablet by mouth daily.    . vitamin B-12 (CYANOCOBALAMIN) 1000 MCG tablet Take 500 mcg by mouth daily.     Marland Kitchen zolpidem (AMBIEN) 10 MG tablet Take 10 mg by mouth at bedtime as needed. For sleep.    Marland Kitchen ZORVOLEX 35 MG CAPS   1   No current facility-administered medications for this visit.    History   Social History  . Marital Status: Married    Spouse Name: N/A    Number of Children: 66  . Years of Education: N/A   Occupational History  . PROJECT ANALYST    Social History Main Topics  . Smoking status: Former Smoker    Quit date: 10/02/1999  . Smokeless tobacco: Never Used  . Alcohol Use: No  . Drug Use:  No  . Sexual Activity: Yes    Birth Control/ Protection: None   Other Topics  Concern  . Not on file   Social History Narrative   History is notable for CAD.  Parents are deceased.  She has 2 alive sisters.   ROS General: Negative; No fevers, chills, or night sweats HEENT: Negative; No changes in vision or hearing, sinus congestion, difficulty swallowing Pulmonary: Negative; No cough, wheezing, shortness of breath, hemoptysis Cardiovascular    Rare atypical chest pain, presyncope, syncope, palpatations GI: Negative; No nausea, vomiting, diarrhea, or abdominal pain GU: Negative; No dysuria, hematuria, or difficulty voiding Musculoskeletal: Negative; no myalgias, joint pain, or weakness Hematologic: Negative; no easy bruising, bleeding Endocrine: Negative; no heat/cold intolerance Neuro: Negative; no changes in balance, headaches Skin: Negative; No rashes or skin lesions Psychiatric: Negative; No behavioral problems, depression Sleep: See history of present illness; No daytime sleepiness, hypersomnolence, bruxism, restless legs, hypnogognic hallucinations, no cataplexy   Physical Exam BP 114/66 mmHg  Pulse 109  Ht 5\' 2"  (1.575 m)  Wt 274 lb 1.6 oz (124.331 kg)  BMI 50.12 kg/m2  Body mass index is compatible with super morbid obesity General: Alert, oriented, no distress.  Skin: normal turgor, no rashes HEENT: Normocephalic, atraumatic. Pupils round and reactive; sclera anicteric; extraocular muscles intact; Fundi are tear or narrowing Nose without nasal septal hypertrophy Mouth/Parynx benign; Mallinpatti scale 3 Neck: No JVD, no carotid bruits Lungs: clear to ausculatation and percussion; no wheezing or rales  Chest wall: No tenderness to palpation Heart: RRR, s1 s2 normal over 6 systolic murmur.  No S3 gallop. Abdomen: soft, nontender; no hepatosplenomehaly, BS+; abdominal aorta nontender and not dilated by palpation. Back: No CVA tenderness Pulses 2+ Extremities: no clubbinbg cyanosis or edema, Homan's sign negative  Neurologic: grossly nonfocal;  cranial nerves intact. Psychological: Normal affect and mood.   LABS:  BMET    Component Value Date/Time   NA 138 03/12/2013 0200   K 3.2* 03/12/2013 0200   CL 102 03/12/2013 0200   CO2 27 03/12/2013 0200   GLUCOSE 124* 03/12/2013 0200   BUN 11 03/12/2013 0200   CREATININE 0.81 03/12/2013 0200   CALCIUM 9.4 03/12/2013 0200   GFRNONAA 83* 03/12/2013 0200   GFRAA >90 03/12/2013 0200     Hepatic Function Panel     Component Value Date/Time   PROT 7.2 08/31/2009 1111   ALBUMIN 3.9 08/31/2009 1111   AST 19 08/31/2009 1111   ALT 17 08/31/2009 1111   ALKPHOS 79 08/31/2009 1111   BILITOT 0.4 08/31/2009 1111     CBC    Component Value Date/Time   WBC 11.5* 03/12/2013 0200   RBC 4.35 03/12/2013 0200   HGB 11.6* 03/12/2013 0200   HCT 35.2* 03/12/2013 0200   PLT 282 03/12/2013 0200   MCV 80.9 03/12/2013 0200   MCH 26.7 03/12/2013 0200   MCHC 33.0 03/12/2013 0200   RDW 16.2* 03/12/2013 0200   LYMPHSABS 2.2 03/11/2013 1133   MONOABS 0.6 03/11/2013 1133   EOSABS 0.2 03/11/2013 1133   BASOSABS 0.0 03/11/2013 1133     BNP No results found for: PROBNP  Lipid Panel     Component Value Date/Time   CHOL 143 03/12/2013 0200   TRIG 128 03/12/2013 0200   HDL 42 03/12/2013 0200   CHOLHDL 3.4 03/12/2013 0200   VLDL 26 03/12/2013 0200   LDLCALC 75 03/12/2013 0200     RADIOLOGY: No results found.    ASSESSMENT AND PLAN: Ms. Julann Mcgilvray is  a 53 year old morbidly obese female who has multiple coronary risk factors including hypertension, hyperlipidemia, as well as remote TIA.  He has been on CPAP therapy since 2012 when she was diagnosed as having at least mild sleep apnea.  She has now a new Pharmacist, community.  She feels a significant improvement from her prior unit, which had malfunctioned.  She is using [employer].  I reviewed her downloaded detail.  She is compliant.  On her current set pressure 13, her AHI is excellent.  She is meeting compliance  standards.  We discussed her sleep duration.  She does not sleep the entire night and often has a several hour period where she is up from 2 AM.  We discussed good sleep hygiene.  We discussed the importance of weight loss.  We discussed the importance of treatment of her sleep apnea particularly in light of her cardiovascular comorbidities.  I answered all questions.  I will see her on an as-needed basis if problems arise.  Time spent: 25 minutes   Troy Sine, MD, [facility]  08/02/2014 2:10 PM

## 2014-08-11 ENCOUNTER — Encounter: Payer: Self-pay | Admitting: Cardiovascular Disease

## 2014-08-12 ENCOUNTER — Encounter: Payer: Self-pay | Admitting: Cardiovascular Disease

## 2014-09-03 ENCOUNTER — Other Ambulatory Visit: Payer: Self-pay | Admitting: Dermatology

## 2014-12-06 ENCOUNTER — Encounter (HOSPITAL_COMMUNITY): Payer: Self-pay | Admitting: Emergency Medicine

## 2014-12-06 ENCOUNTER — Emergency Department (INDEPENDENT_AMBULATORY_CARE_PROVIDER_SITE_OTHER)
Admission: EM | Admit: 2014-12-06 | Discharge: 2014-12-06 | Disposition: A | Discharge status: Home / Self Care | Payer: 59 | Source: Home / Self Care | Attending: Emergency Medicine | Admitting: Emergency Medicine

## 2014-12-06 DIAGNOSIS — L0100 Impetigo, unspecified: Secondary | ICD-10-CM

## 2014-12-06 MED ORDER — MUPIROCIN CALCIUM 2 % EX CREA: 1.0000 "application " | TOPICAL_CREAM | Freq: Two times a day (BID) | CUTANEOUS | Status: DC

## 2014-12-06 NOTE — Discharge Instructions (Signed)
You have a mild infection of your belly button. Wash the bellybutton with soap and water twice a day. Apply mupirocin cream twice a day. If there is no improvement in 48 hours, please follow-up here or with your primary care doctor.

## 2014-12-06 NOTE — ED Provider Notes (Signed)
CSN: [id number]     Arrival date & time 12/06/14  1844 History   First MD Initiated Contact with Patient 12/06/14 1855     Chief Complaint  Patient presents with  . Bleeding/Bruising    bleeding from navel   (Consider location/radiation/quality/duration/timing/severity/associated sxs/prior Treatment) HPI She is a 54 year old woman here for evaluation of bleeding bellybutton. She states she noticed stickiness in her bellybutton today. She washed it out with peroxide and burned. No fevers or chills. No known injury or trauma to the bellybutton.  Past Medical History  Diagnosis Date  . Hyperlipidemia   . GERD (gastroesophageal reflux disease)   . Headache(784.0)     migraine-like  . Degenerative disc disease   . History of UTI   . Sleep apnea     uses CPAP nightly  . Shoulder impingement 04/2012    left  . Asthma     daily and prn inhalers  . Hypertension     under control, has been on med. x 4-5 yrs.  . Anemia     takes iron supplement  . History of TIA (transient ischemic attack) early 2013    no weakness or deficits  . Partial tear of rotator cuff(726.13)   . Chest pain     negative Lexiscan myoview 03/12/2013  . Family history of early CAD   . History of echocardiogram 02/05/2010    EF > 55%; normal study  . History of nuclear stress test 11/03/2011    lexiscan; normal pattern of perfusion; low risk scan; normal study    Past Surgical History  Procedure Laterality Date  . Umbilical hernia repair  03/28/2002  . Dilation and curettage of uterus      2000  . Anterior cervical decomp/discectomy fusion  02/18/2010    C6-7  . Total knee arthroplasty  09/07/2009    left  . Total knee arthroplasty  10/13/2008    right  . Abdominal hysterectomy  03/28/2002    complete   Family History  Problem Relation Age of Onset  . Diabetes Mother   . Kidney disease Father   . Breast cancer Sister   . Diabetes Sister   . Coronary artery disease Sister   . Hypertension Mother   .  Hypertension Father   . Diabetes Father   . Cancer Maternal Grandmother   . Diabetes Maternal Grandmother   . Hypertension Maternal Grandmother   . Diabetes Paternal Grandfather   . Cancer Paternal Grandfather    History  Substance Use Topics  . Smoking status: Former Smoker    Quit date: 10/02/1999  . Smokeless tobacco: Never Used  . Alcohol Use: No   OB History    No data available     Review of Systems As in history of present illness Allergies  Review of patient's allergies indicates no known allergies.  Home Medications   Prior to Admission medications   Medication Sig Start Date End Date Taking? Authorizing Provider  acidophilus (RISAQUAD) CAPS Take 1 capsule by mouth every morning.    Yes Historical Provider, MD  aspirin 81 MG tablet Take 81 mg by mouth daily as needed for pain.   Yes Historical Provider, MD  atorvastatin (LIPITOR) 20 MG tablet Take 20 mg by mouth every evening.    Yes Historical Provider, MD  beclomethasone (QVAR) 80 MCG/ACT inhaler Inhale 2 puffs into the lungs 2 (two) times daily.   Yes Historical Provider, MD  diclofenac sodium (VOLTAREN) 1 % GEL Apply 2 g topically 4 (  four) times daily as needed (applies to wrists).   Yes Historical Provider, MD  ferrous sulfate 325 (65 FE) MG tablet Take 325 mg by mouth 4 (four) times a week. Sunday, Tuesday, Thursday, and saturday   Yes Historical Provider, MD  furosemide (LASIX) 20 MG tablet Take 20 mg by mouth daily.    Yes Historical Provider, MD  hydrOXYzine (ATARAX/VISTARIL) 25 MG tablet Take 25 mg by mouth every 8 (eight) hours as needed. For itching.   Yes Historical Provider, MD  loratadine (CLARITIN) 10 MG tablet Take 20 mg by mouth daily.    Yes Historical Provider, MD  LORazepam (ATIVAN) 0.5 MG tablet Take 0.5 mg by mouth every 8 (eight) hours as needed for anxiety.    Yes Historical Provider, MD  metFORMIN (GLUCOPHAGE) 500 MG tablet 500 mg.  04/08/14  Yes Historical Provider, MD  Multiple Vitamin  (MULITIVITAMIN WITH MINERALS) TABS Take 1 tablet by mouth daily.   Yes Historical Provider, MD  mupirocin ointment (BACTROBAN) 2 % Apply 1 application topically daily. And cover 06/06/14  Yes Bronson Ing, DPM  omeprazole (PRILOSEC) 20 MG capsule Take 20 mg by mouth 2 (two) times daily.    Yes Denita Lung, MD  topiramate (TOPAMAX) 100 MG tablet Take 150 mg by mouth at bedtime.    Yes Historical Provider, MD  traMADol Veatrice Bourbon) 50 MG tablet  04/23/14  Yes Historical Provider, MD  valsartan-hydrochlorothiazide (DIOVAN-HCT) 160-12.5 MG per tablet Take 1 tablet by mouth daily.   Yes Historical Provider, MD  vitamin B-12 (CYANOCOBALAMIN) 1000 MCG tablet Take 500 mcg by mouth daily.    Yes Historical Provider, MD  zolpidem (AMBIEN) 10 MG tablet Take 10 mg by mouth at bedtime as needed. For sleep.   Yes Historical Provider, MD  ZORVOLEX 35 MG CAPS  06/20/14  Yes Historical Provider, MD  cyclobenzaprine (FLEXERIL) 10 MG tablet Take 10 mg by mouth 3 (three) times daily as needed for muscle spasms.    Historical Provider, MD  mometasone (NASONEX) 50 MCG/ACT nasal spray Place 1 spray into the nose daily.     Historical Provider, MD  mupirocin cream (BACTROBAN) 2 % Apply 1 application topically 2 (two) times daily. For 7-10 days 12/06/14   Melony Overly, MD  NON FORMULARY at bedtime. CPAP    Historical Provider, MD  Olopatadine HCl 0.2 % SOLN Apply 1 drop to eye daily as needed. For dry eyes.    Historical Provider, MD  ONE TOUCH ULTRA TEST test strip  04/29/14   Historical Provider, MD  Jonetta Speak LANCETS 16X MISC  04/29/14   Historical Provider, MD   BP 160/90 mmHg  Pulse 106  Temp(Src) 98.2 F (36.8 C) (Oral)  Resp 16  SpO2 100% Physical Exam  Constitutional: She is oriented to person, place, and time. She appears well-developed and well-nourished. No distress.  Cardiovascular: Normal rate.   Pulmonary/Chest: Effort normal.  Neurological: She is alert and oriented to person, place, and time.   Skin:  Superficial abrasions and mild erythema circumferentially in the bellybutton. There is an odor. No obvious pus.    ED Course  Procedures (including critical care time) Labs Review Labs Reviewed - No data to display  Imaging Review No results found.   MDM   1. Impetigo    She has what appears to be a superficial skin infection of the bellybutton. Will treat with mupirocin twice a day. If no improvement in 2 days, she will follow-up here or with her  PCP.    Melony Overly, MD 12/06/14 1928

## 2014-12-06 NOTE — ED Notes (Signed)
Reports noticing bleeding from navel today.  Denies any pain.  No known injury.  Pt states only burning sensation when using peroxide to clean naval.

## 2014-12-09 ENCOUNTER — Telehealth: Payer: Self-pay | Admitting: Cardiovascular Disease

## 2014-12-09 NOTE — Telephone Encounter (Signed)
Returned call to patient Dr.Croitoru advised if pain worsens when applying pressure to chest it is not cardiac.He advised to take flexeril and take OTC NSAIDS like Motrin or Advil.Advised to take regular after a meal as prescribed for 4 or 5 days.Advised to call back if continues to have pain.

## 2014-12-09 NOTE — Telephone Encounter (Signed)
If pain is worsened by applying pressure to her chest, it is not cardiac. Flexeril and OTC NSAIDs should help

## 2014-12-09 NOTE — Telephone Encounter (Signed)
Returned call to patient she stated when she sat up in bed this morning she had a pulling pain in left chest.Stated pain was like pain she had in July 2014 when she had inflamed chest wall.Stated she took a flexeril and has had no relief.Stated pain is worse when she applies pressure to her chest.Stated she called her PCP and he advised her to call her cardiologist. Dr.Croitoru out of office will send message to him for advice.

## 2014-12-09 NOTE — Telephone Encounter (Signed)
Pt called in stating that she woke up this morning and had a pulling feeling in her chest and she wanted to come in and get checked out. She says that she called her PCP and he recommended that she come in and be seen by her Cardiologist. Please call  Thank

## 2015-03-09 ENCOUNTER — Other Ambulatory Visit: Payer: Self-pay | Admitting: Internal Medicine

## 2015-03-09 DIAGNOSIS — N6310 Unspecified lump in the right breast, unspecified quadrant: Secondary | ICD-10-CM

## 2015-04-13 ENCOUNTER — Other Ambulatory Visit: Payer: 59

## 2015-04-16 ENCOUNTER — Ambulatory Visit
Admission: RE | Admit: 2015-04-16 | Discharge: 2015-04-16 | Disposition: A | Discharge status: Home / Self Care | Payer: 59 | Source: Ambulatory Visit | Attending: Internal Medicine | Admitting: Internal Medicine

## 2015-04-16 DIAGNOSIS — N6310 Unspecified lump in the right breast, unspecified quadrant: Secondary | ICD-10-CM

## 2015-06-03 ENCOUNTER — Encounter: Payer: Self-pay | Admitting: Cardiovascular Disease

## 2015-06-03 ENCOUNTER — Ambulatory Visit (INDEPENDENT_AMBULATORY_CARE_PROVIDER_SITE_OTHER): Payer: 59 | Admitting: Cardiovascular Disease

## 2015-06-03 VITALS — BP 132/88 | HR 92 | Resp 20 | Ht 62.0 in | Wt 289.0 lb

## 2015-06-03 DIAGNOSIS — R0600 Dyspnea, unspecified: Secondary | ICD-10-CM | POA: Diagnosis not present

## 2015-06-03 DIAGNOSIS — I10 Essential (primary) hypertension: Secondary | ICD-10-CM

## 2015-06-03 DIAGNOSIS — G4730 Sleep apnea, unspecified: Secondary | ICD-10-CM | POA: Diagnosis not present

## 2015-06-03 DIAGNOSIS — E785 Hyperlipidemia, unspecified: Secondary | ICD-10-CM

## 2015-06-03 DIAGNOSIS — Z8673 Personal history of transient ischemic attack (TIA), and cerebral infarction without residual deficits: Secondary | ICD-10-CM

## 2015-06-03 NOTE — Patient Instructions (Signed)
Your physician has requested that you have an echocardiogram. Echocardiography is a painless test that uses sound waves to create images of your heart. It provides your doctor with information about the size and shape of your heart and how well your heart's chambers and valves are working. This procedure takes approximately one hour. There are no restrictions for this procedure.  Dr. Croitoru recommends that you schedule a follow-up appointment in: ONE YEAR   

## 2015-06-03 NOTE — Progress Notes (Signed)
Patient ID: [name], female   DOB: 1961/01/05, 54 y.o.   MRN: [id number]     Cardiology Office Note   Date:  06/03/2015   ID:  [name], DOB 04/22/1961, MRN [id number]  PCP:  Precious Reel, MD  Cardiologist:   Sanda Klein, MD   Chief Complaint  Patient presents with  . Annual Exam  . Dizziness    When she stands at times  . Chest Pain    for a minute then stops  . Edema    in her legs, hands, feet, and ankles      History of Present Illness: [name] is a 54 y.o. female who presents for  Morbid obesity, obstructive sleep apnea, multiple coronary risk factors and remote history of TIA.   [name] is a 54 year old woman with a remote history of transient ischemic attack of uncertain etiology, hypertension, hyperlipidemia, type 2 diabetes mellitus, morbid obesity and obstructive sleep apnea.    since her last appointment she has been less physically active due to problems with sacroiliac joint pain and recurrent migraines. She believes this is why she has gained 15 pounds since last November. She has also noticed increased wheezing and exertional dyspnea, which she blames on the weight gain. She is using her CPAP 100% of the time and is compliant with all her other medications. She reports that her diabetes  Has excellent control, with a hemoglobin A1c less than 6%. She also reports that a recent lipid profile was very good. Her blood pressure is well controlled. She quit smoking in 2001.   normal nuclear perfusion study , EF 56% in 2013.  Overall normal left ventricular size, no left ventricular hypertrophy, normal EF , normal Doppler pattern by echo 2011    Past Medical History  Diagnosis Date  . Hyperlipidemia   . GERD (gastroesophageal reflux disease)   . Headache(784.0)     migraine-like  . Degenerative disc disease   . History of UTI   . Sleep apnea     uses CPAP nightly  . Shoulder impingement 04/2012    left  . Asthma     daily and  prn inhalers  . Hypertension     under control, has been on med. x 4-5 yrs.  . Anemia     takes iron supplement  . History of TIA (transient ischemic attack) early 2013    no weakness or deficits  . Partial tear of rotator cuff(726.13)   . Chest pain     negative Lexiscan myoview 03/12/2013  . Family history of early CAD   . History of echocardiogram 02/05/2010    EF > 55%; normal study  . History of nuclear stress test 11/03/2011    lexiscan; normal pattern of perfusion; low risk scan; normal study     Past Surgical History  Procedure Laterality Date  . Umbilical hernia repair  03/28/2002  . Dilation and curettage of uterus      2000  . Anterior cervical decomp/discectomy fusion  02/18/2010    C6-7  . Total knee arthroplasty  09/07/2009    left  . Total knee arthroplasty  10/13/2008    right  . Abdominal hysterectomy  03/28/2002    complete     Current Outpatient Prescriptions  Medication Sig Dispense Refill  . acidophilus (RISAQUAD) CAPS Take 1 capsule by mouth every morning.     Marland Kitchen aspirin 81 MG tablet Take 81 mg by mouth daily as needed for pain.    Marland Kitchen  atorvastatin (LIPITOR) 20 MG tablet Take 20 mg by mouth every evening.     . baclofen (LIORESAL) 10 MG tablet Take 20 mg by mouth once a week. Two per week    . beclomethasone (QVAR) 80 MCG/ACT inhaler Inhale 2 puffs into the lungs 2 (two) times daily.    . Calcium Carbonate Antacid (TUMS E-X PO) Take by mouth at bedtime.    . cyclobenzaprine (FLEXERIL) 10 MG tablet Take 10 mg by mouth 2 (two) times daily.     . ferrous sulfate 325 (65 FE) MG tablet Take 325 mg by mouth 4 (four) times a week. Sunday, Tuesday, Thursday, and saturday    . furosemide (LASIX) 20 MG tablet Take 20 mg by mouth daily.     Marland Kitchen HYDROcodone-acetaminophen (NORCO/VICODIN) 5-325 MG per tablet Take 1 tablet by mouth 3 (three) times daily as needed for moderate pain.    . hydrOXYzine (ATARAX/VISTARIL) 25 MG tablet Take 25 mg by mouth every 8 (eight) hours as  needed. For itching.    . loratadine (CLARITIN) 10 MG tablet Take 20 mg by mouth daily.     Marland Kitchen LORazepam (ATIVAN) 0.5 MG tablet Take 0.5 mg by mouth daily.     . metFORMIN (GLUCOPHAGE) 500 MG tablet 500 mg.     . mometasone (NASONEX) 50 MCG/ACT nasal spray Place 1 spray into the nose daily.     . Multiple Vitamin (MULITIVITAMIN WITH MINERALS) TABS Take 1 tablet by mouth daily.    . mupirocin cream (BACTROBAN) 2 % Apply 1 application topically 2 (two) times daily. For 7-10 days 15 g 0  . mupirocin ointment (BACTROBAN) 2 % Apply 1 application topically daily. And cover 22 g 0  . NON FORMULARY at bedtime. CPAP    . Olopatadine HCl 0.2 % SOLN Apply 1 drop to eye daily as needed. For dry eyes.    Marland Kitchen omeprazole (PRILOSEC) 20 MG capsule Take 20 mg by mouth 2 (two) times daily.     . ONE TOUCH ULTRA TEST test strip     . ONETOUCH DELICA LANCETS 78H MISC     . sulfaSALAzine (AZULFIDINE) 500 MG tablet Take 500 mg by mouth 2 (two) times daily.    Marland Kitchen topiramate (TOPAMAX) 100 MG tablet Take 150 mg by mouth at bedtime.     . valsartan-hydrochlorothiazide (DIOVAN-HCT) 160-12.5 MG per tablet Take 1 tablet by mouth daily.    . vitamin B-12 (CYANOCOBALAMIN) 1000 MCG tablet Take 500 mcg by mouth daily.     Marland Kitchen zolpidem (AMBIEN) 10 MG tablet Take 10 mg by mouth at bedtime as needed. For sleep.    Marland Kitchen ZORVOLEX 35 MG CAPS   1   No current facility-administered medications for this visit.    Allergies:   Review of patient's allergies indicates no known allergies.    Social History:  The patient  reports that she quit smoking about 15 years ago. She has never used smokeless tobacco. She reports that she does not drink alcohol or use illicit drugs.   Family History:  The patient's family history includes Breast cancer in her sister; Cancer in her maternal grandmother and paternal grandfather; Coronary artery disease in her sister; Diabetes in her father, maternal grandmother, mother, paternal grandfather, and sister;  Hypertension in her father, maternal grandmother, and mother; Kidney disease in her father.    ROS:  Please see the history of present illness.    Otherwise, review of systems positive for  Exertional dyspnea, NYHA class II-III, multiple  musculoskeletal complaints. She denies exertional chest pain, orthopnea, PND, focal neurological deficits, claudication. She has occasional relatively mild ankle edema..   All other systems are reviewed and negative.    PHYSICAL EXAM: VS:  BP 132/88 mmHg  Pulse 92  Ht 5\' 2"  (1.575 m)  Wt 289 lb (131.09 kg)  BMI 52.85 kg/m2 , BMI Body mass index is 52.85 kg/(m^2).  General: Alert, oriented x3, no distress;  Super obesity masks some features of her physical exam Head: no evidence of trauma, PERRL, EOMI, no exophtalmos or lid lag, no myxedema, no xanthelasma; normal ears, nose and oropharynx Neck: normal jugular venous pulsations and no hepatojugular reflux; brisk carotid pulses without delay and no carotid bruits Chest: clear to auscultation, no signs of consolidation by percussion or palpation, normal fremitus, symmetrical and full respiratory excursions Cardiovascular:  Unable to identifythe apical impulse, regular rhythm, normal first and second heart sounds, no murmurs, rubs or gallops Abdomen: no tenderness or distention, no masses by palpation, no abnormal pulsatility or arterial bruits, normal bowel sounds, no hepatosplenomegaly Extremities: no clubbing, cyanosis or edema; 2+ radial, ulnar and brachial pulses bilaterally; 2+ right femoral, posterior tibial and dorsalis pedis pulses; 2+ left femoral, posterior tibial and dorsalis pedis pulses; no subclavian or femoral bruits Neurological: grossly nonfocal Psych: euthymic mood, full affect   EKG:  EKG is ordered today. The ekg ordered today demonstrates  Normal sinus rhythm borderline QTC at 462 ms   Recent Labs: No results found for requested labs within last 365 days.    Lipid Panel      Component Value Date/Time   CHOL 143 03/12/2013 0200   TRIG 128 03/12/2013 0200   HDL 42 03/12/2013 0200   CHOLHDL 3.4 03/12/2013 0200   VLDL 26 03/12/2013 0200   LDLCALC 75 03/12/2013 0200      Wt Readings from Last 3 Encounters:  06/03/15 289 lb (131.09 kg)  07/31/14 274 lb 1.6 oz (124.331 kg)  05/01/14 279 lb (126.554 kg)    .   ASSESSMENT AND PLAN:   Mrs. Gane has numerous risk factors for heart disease and particularly for diastolic heart failure. It is possible that her weight gain is actually sign of hypervolemia and this explains her  Worsening wheezing and exertional dyspnea, which she has been blaming on true weight gain. Have recommended an echocardiogram. The Doppler flow analysis may permit [place] to detect heart failure. If this study still shows normal findings , focus should be on and has physical activity and aggressive attempts at weight loss.    Current medicines are reviewed at length with the patient today.  The patient does not have concerns regarding medicines.  The following changes have been made:  no change  Labs/ tests ordered today include:  Orders Placed This Encounter  Procedures  . EKG 12-Lead  . ECHOCARDIOGRAM COMPLETE    Patient Instructions  Your physician has requested that you have an echocardiogram. Echocardiography is a painless test that uses sound waves to create images of your heart. It provides your doctor with information about the size and shape of your heart and how well your heart's chambers and valves are working. This procedure takes approximately one hour. There are no restrictions for this procedure.  Dr. Sallyanne Kuster recommends that you schedule a follow-up appointment in: ONE YEAR      SignedSanda Klein, MD  06/03/2015 10:23 PM    Sanda Klein, MD, [facility] [phone] office [phone] pager

## 2015-06-09 ENCOUNTER — Encounter: Payer: Self-pay | Admitting: Cardiovascular Disease

## 2015-06-16 ENCOUNTER — Ambulatory Visit (HOSPITAL_COMMUNITY): Payer: 59 | Attending: Cardiology

## 2015-06-16 ENCOUNTER — Other Ambulatory Visit: Payer: Self-pay

## 2015-06-16 DIAGNOSIS — I517 Cardiomegaly: Secondary | ICD-10-CM | POA: Insufficient documentation

## 2015-06-16 DIAGNOSIS — I340 Nonrheumatic mitral (valve) insufficiency: Secondary | ICD-10-CM | POA: Insufficient documentation

## 2015-06-16 DIAGNOSIS — E119 Type 2 diabetes mellitus without complications: Secondary | ICD-10-CM | POA: Insufficient documentation

## 2015-06-16 DIAGNOSIS — Z6841 Body Mass Index (BMI) 40.0 and over, adult: Secondary | ICD-10-CM | POA: Diagnosis not present

## 2015-06-16 DIAGNOSIS — I10 Essential (primary) hypertension: Secondary | ICD-10-CM | POA: Diagnosis not present

## 2015-06-16 DIAGNOSIS — R0600 Dyspnea, unspecified: Secondary | ICD-10-CM | POA: Diagnosis not present

## 2015-06-16 DIAGNOSIS — E785 Hyperlipidemia, unspecified: Secondary | ICD-10-CM | POA: Diagnosis not present

## 2015-06-17 ENCOUNTER — Telehealth: Payer: Self-pay | Admitting: *Deleted

## 2015-06-17 DIAGNOSIS — R0600 Dyspnea, unspecified: Secondary | ICD-10-CM

## 2015-06-17 MED ORDER — POTASSIUM CHLORIDE ER 10 MEQ PO TBCR: 10.0000 meq | EXTENDED_RELEASE_TABLET | Freq: Every day | ORAL | Status: DC

## 2015-06-17 MED ORDER — FUROSEMIDE 40 MG PO TABS: 40.0000 mg | ORAL_TABLET | Freq: Every day | ORAL | Status: DC

## 2015-06-17 NOTE — Telephone Encounter (Signed)
Echo results called to patient.  Instructed to increase furosemide to 40mg  daily and start K+ 49meq daily.  Rx's sent to pharmacy.  Patient will call Friday AM if she doesn't lose 1-2 lbs in 1-2 days. BMP and BNP 06/25/15.  Patient will go to [place].

## 2015-06-17 NOTE — Telephone Encounter (Signed)
-----   Message from Sanda Klein, MD sent at 06/16/2015 12:47 PM EDT ----- The echo findings confirm the suspicion that her worsening dyspnea is from diastolic HF rather than only asthma. Recommend that she increase furosemide to 40 mg daily and add KCL 10 mEq daily. She probably has 10-15 lb of extra fluid on board. She should be losing 1-2 lbs every 1-2 days. If this does not happen, please call us no later than Friday morning to adjust diuretic further. Check a BMP and BNP next week

## 2015-06-19 ENCOUNTER — Telehealth: Payer: Self-pay | Admitting: Cardiovascular Disease

## 2015-06-19 NOTE — Telephone Encounter (Signed)
Spoke with pt, she voiced understanding of medication changes. She will call back Monday.

## 2015-06-19 NOTE — Telephone Encounter (Signed)
Please increase furosemide to 80 mg daily and Kdur to 20 mEq daily. Call us back Monday with weight and symptoms.

## 2015-06-19 NOTE — Telephone Encounter (Signed)
Please call,she was suppose to call you back today.

## 2015-06-19 NOTE — Telephone Encounter (Signed)
Spoke with pt, she still has a little SOB. Her weight is down 2 lbs from Wednesday Will make dr croitoru aware

## 2015-06-22 ENCOUNTER — Telehealth: Payer: Self-pay | Admitting: Cardiovascular Disease

## 2015-06-22 NOTE — Telephone Encounter (Signed)
Agree with recommendations.  

## 2015-06-22 NOTE — Telephone Encounter (Signed)
"   Sanda Klein, MD at 06/19/2015 1:58 PM     Status: Signed       Expand All Collapse All   Please increase furosemide to 80 mg daily and Kdur to 20 mEq daily. Call us back Monday with weight and symptoms.       "  Pt reports this morning her weight is overall unchanged but other symptoms (SOB, swelling), much improved.  Weights: Fri 287.6 Sat 287.4 Mon 287.2  Pt also reports noticeable increase in UOP. Advised that even if weight is unchanged, since other symptoms resolved, best recommendation would be to keep at current dose. Advised to continue current dose until o/w instructed, would route to Dr. Sallyanne Kuster.  Pt voiced understanding.

## 2015-06-22 NOTE — Telephone Encounter (Signed)
Pt was instructed to call back in to speak with a nurse about her recent weight changes due to hear change in furosemide dosage. Please call  Thanks

## 2015-06-24 ENCOUNTER — Other Ambulatory Visit (INDEPENDENT_AMBULATORY_CARE_PROVIDER_SITE_OTHER): Payer: 59

## 2015-06-24 DIAGNOSIS — I10 Essential (primary) hypertension: Secondary | ICD-10-CM

## 2015-06-24 LAB — BASIC METABOLIC PANEL
BUN: 17 mg/dL (ref 7–25)
CO2: 31 mmol/L (ref 20–31)
Calcium: 10.6 mg/dL — ABNORMAL HIGH (ref 8.6–10.4)
Chloride: 93 mmol/L — ABNORMAL LOW (ref 98–110)
Creat: 0.98 mg/dL (ref 0.50–1.05)
Glucose, Bld: 134 mg/dL — ABNORMAL HIGH (ref 65–99)
Potassium: 3 mmol/L — ABNORMAL LOW (ref 3.5–5.3)
Sodium: 134 mmol/L — ABNORMAL LOW (ref 135–146)

## 2015-06-24 LAB — BRAIN NATRIURETIC PEPTIDE: Brain Natriuretic Peptide: 102.6 pg/mL — ABNORMAL HIGH (ref 0.0–100.0)

## 2015-06-25 ENCOUNTER — Telehealth: Payer: Self-pay | Admitting: *Deleted

## 2015-06-25 DIAGNOSIS — Z79899 Other long term (current) drug therapy: Secondary | ICD-10-CM

## 2015-06-25 MED ORDER — POTASSIUM CHLORIDE CRYS ER 20 MEQ PO TBCR: 40.0000 meq | EXTENDED_RELEASE_TABLET | Freq: Every day | ORAL | Status: DC

## 2015-06-25 NOTE — Telephone Encounter (Signed)
-----   Message from Sanda Klein, MD sent at 06/25/2015  9:39 AM EDT ----- Increase KCl to 40 mEq daily and recheck in a week please. Copy PCP

## 2015-06-25 NOTE — Telephone Encounter (Signed)
PATIENT NOTIFIED OF LAB RESULTS.  Will increase K+ to 40 meq daily and recheck labs in one week.  Patient voiced understanding.  New Rx sent to pharmacy.

## 2015-07-20 ENCOUNTER — Encounter: Payer: Self-pay | Admitting: Cardiovascular Disease

## 2015-09-29 ENCOUNTER — Encounter: Payer: Self-pay | Admitting: Gastroenterology

## 2015-10-01 ENCOUNTER — Other Ambulatory Visit: Payer: Self-pay | Admitting: Internal Medicine

## 2015-10-01 DIAGNOSIS — R911 Solitary pulmonary nodule: Secondary | ICD-10-CM

## 2015-10-05 ENCOUNTER — Ambulatory Visit
Admission: RE | Admit: 2015-10-05 | Discharge: 2015-10-05 | Disposition: A | Discharge status: Home / Self Care | Payer: BLUE CROSS/BLUE SHIELD | Source: Ambulatory Visit | Attending: Internal Medicine | Admitting: Internal Medicine

## 2015-10-05 DIAGNOSIS — R911 Solitary pulmonary nodule: Secondary | ICD-10-CM

## 2015-10-07 ENCOUNTER — Other Ambulatory Visit: Payer: Self-pay | Admitting: Internal Medicine

## 2015-10-07 DIAGNOSIS — R911 Solitary pulmonary nodule: Secondary | ICD-10-CM

## 2015-10-19 ENCOUNTER — Encounter: Payer: BLUE CROSS/BLUE SHIELD | Attending: Rheumatology | Admitting: Skilled Nursing Facility1

## 2015-10-19 ENCOUNTER — Encounter: Payer: Self-pay | Admitting: Skilled Nursing Facility1

## 2015-10-19 DIAGNOSIS — I509 Heart failure, unspecified: Secondary | ICD-10-CM | POA: Insufficient documentation

## 2015-10-19 DIAGNOSIS — E669 Obesity, unspecified: Secondary | ICD-10-CM | POA: Insufficient documentation

## 2015-10-19 DIAGNOSIS — Z6841 Body Mass Index (BMI) 40.0 and over, adult: Secondary | ICD-10-CM | POA: Diagnosis not present

## 2015-10-19 NOTE — Patient Instructions (Signed)
-  Some possible workouts: water aerobics, seated bike for as long as you can tolerate, keep doing your stretches given to you by physical therapy -Plan out your meals for the week -Check you glucose at different times throughout the week -Check your glucose 2 hours after you eat -Take your vitamin C with your iron supplement  -USP label for your supplements-at the bottom front of the bottle -Read the nutrition facts label for all your foods

## 2015-10-19 NOTE — Progress Notes (Signed)
  Medical Nutrition Therapy:  Appt start time: 1100 end time:  1200.   Assessment:  Primary concerns today: referred for obesity. Pt states she wants to eat healthy and lose weight. Pt states she has stopped eating after 8pm, stopped drinking all soda, exercising but due to pain had to stop but wants to start again, reduced fried foods (started this around thanksgiving). Pt states she has lost a couple pounds a year (pt is taking 80 mg of furosemide). Pts wt hx: highest 306 pounds, pt states she wants to be below 150 pounds. Pt states she eats in front of the television. Pt states she is on a low sodium diet. Pt states she has had Physical therapy and 2 injections for SI joint pain. Pt reports Sometimes peeing/thirsty in the middle of the night but not often. Pt states she Wants diabetes education. Pt states her energy level is good. Pt states she still has low iron labs despite a supplement. Pt states she can skip meals easily due to a lack of appetite/fear of overeating portion sizes.  Labs: sodium 134, Potassium 3, chloride 93, glucose 134, calcium 10.6.  Preferred Learning Style:   Auditory   Learning Readiness:  -Change in progress   MEDICATIONS: See List   DIETARY INTAKE:  Usual eating pattern includes 1-3 meals and 2 snacks per day.  Everyday foods include none stated.  Avoided foods include none stated.    24-hr recall:  B ( AM):  Cereal----grits---oatmeal----leftovers from dinner Snk ( AM): fruit L ( PM): none------fruit Snk ( PM): fruited jello----fruit D ( PM): leftovers----fruit Snk ( PM): fruit Beverages: water, coffee, sweet tea  Usual physical activity: walk to the mailbox  Estimated energy needs: 1600 calories 180 g carbohydrates 120 g protein 44 g fat  Progress Towards Goal(s):  In progress.   Nutritional Diagnosis:  NB-1.1 Food and nutrition-related knowledge deficit As related to no prior nutrition education from a nutrition professional.  As evidenced by  pt report, 24 hr recall, meal skipping, and BMI 50.66.    Intervention:  Nutrition counseling for obesity/diabetes. Dietitian educated the pt on carbohydrate counting, balanced meals, eating throughout the day, sodium rich foods, iron rich foods/iron absorption, and the importance of physical activity. Goals: -Some possible workouts: water aerobics, seated bike for as long as you can tolerate, keep doing your stretches given to you by physical therapy -Plan out your meals for the week -Check you glucose at different times throughout the week -Check your glucose 2 hours after you eat -Take your vitamin C with your iron supplement  -USP label for your supplements-at the bottom front of the bottle -Read the nutrition facts label for all your foods  Teaching Method Utilized:  Visual Auditory Hand On  Handouts given during visit include:  Living well book  Yellow card  Low sodium flavoring options  Snack sheet  Barriers to learning/adherence to lifestyle change: fear of overeating   Demonstrated degree of understanding via:  Teach Back   Monitoring/Evaluation:  Dietary intake, exercise, A1C, and body weight prn.

## 2015-10-21 ENCOUNTER — Telehealth: Payer: Self-pay | Admitting: Cardiovascular Disease

## 2015-10-21 ENCOUNTER — Encounter: Payer: Self-pay | Admitting: Physician Assistant

## 2015-10-21 NOTE — Progress Notes (Signed)
Cardiology Office Note Date:  10/22/2015  Patient ID:  Bonnie, Mathis [birth year], MRN [id number] PCP:  Precious Reel, MD  Cardiologist: Dr. Sallyanne Kuster   Chief Complaint: chest discomfort, palpitations  History of Present Illness: [name] is a 55 y.o. female with history of chronic diastolic CHF, morbid obesity, OSA, TIA, GERD, HTN, HLD, DM, asthma, anemia, former tobacco abuse who presents for evaluation of chest discomfort. Prior eval includes normal nuc in 2014. She was diagnosed with dCHF in 06/2015 and started on Lasix when 2D echo 06/16/15 showed mild focal basal hypertrophy of septum, EF 55-60%, no RWMA, grade 2 DD, high VFP, trivial MR. Labs 06/2015 showed Hgb 12.4, normal LFTs, K 3.4, Cr 0.85. She has h/o pulm nodules followed by PCP.  For the past 3 weeks she has had intermittent chest discomfort described as a "flash" of lightning. She feels this as a quick sharp discomfort in her left upper chest. This is frequently accompanied by sensation of palpitations (heart racing), hot flashes, and occasionally nausea. It has happened 5-6x over the last 3 weeks. There is no particular trigger - tends to happen when she is watching TV. She has not had any SOB. She does not exercise due to SI joint problems. She has not had any angina while walking to the mailbox. She did notice yesterday she felt unusually fatigued and weak. She was told her iron was low by her PCP in January. LEE has resolved since addition of Lasix in the fall. No northopnea or further weight gain. No bleeding or syncope.   Past Medical History  Diagnosis Date  . Hyperlipidemia   . GERD (gastroesophageal reflux disease)   . Headache(784.0)     migraine-like  . Degenerative disc disease   . History of UTI   . Sleep apnea     uses CPAP nightly  . Shoulder impingement 04/2012    left  . Asthma     daily and prn inhalers  . Essential hypertension   . Anemia     takes iron supplement  . History of TIA  (transient ischemic attack) early 2013    no weakness or deficits  . Partial tear of rotator cuff(726.13)   . Family history of early CAD   . Chronic diastolic CHF (congestive heart failure) ([place])   . Morbid obesity ([place])   . Diabetes mellitus ([place])   . Former tobacco use     Past Surgical History  Procedure Laterality Date  . Umbilical hernia repair  03/28/2002  . Dilation and curettage of uterus      2000  . Anterior cervical decomp/discectomy fusion  02/18/2010    C6-7  . Total knee arthroplasty  09/07/2009    left  . Total knee arthroplasty  10/13/2008    right  . Abdominal hysterectomy  03/28/2002    complete    Current Outpatient Prescriptions  Medication Sig Dispense Refill  . acidophilus (RISAQUAD) CAPS Take 1 capsule by mouth every morning.     Marland Kitchen aspirin 81 MG tablet Take 81 mg by mouth daily as needed for pain.    Marland Kitchen atorvastatin (LIPITOR) 20 MG tablet Take 20 mg by mouth every evening.     . baclofen (LIORESAL) 10 MG tablet Take 20 mg by mouth once a week. Two per week    . beclomethasone (QVAR) 80 MCG/ACT inhaler Inhale 2 puffs into the lungs 2 (two) times daily.    . cyclobenzaprine (FLEXERIL) 10 MG tablet Take 10 mg  by mouth 2 (two) times daily.     . ferrous sulfate 325 (65 FE) MG tablet Take 325 mg by mouth 4 (four) times a week. Sunday, Tuesday, Thursday, and saturday    . furosemide (LASIX) 80 MG tablet Take 80 mg by mouth daily.    Marland Kitchen HYDROcodone-acetaminophen (NORCO/VICODIN) 5-325 MG per tablet Take 1 tablet by mouth 3 (three) times daily as needed for moderate pain.    . hydrOXYzine (ATARAX/VISTARIL) 25 MG tablet Take 25 mg by mouth every 8 (eight) hours as needed. For itching.    . loratadine (CLARITIN) 10 MG tablet Take 20 mg by mouth daily.     Marland Kitchen LORazepam (ATIVAN) 0.5 MG tablet Take 0.5 mg by mouth daily.     . metFORMIN (GLUCOPHAGE) 500 MG tablet 500 mg.     . Multiple Vitamin (MULITIVITAMIN WITH MINERALS) TABS Take 1 tablet by mouth daily.    . mupirocin  cream (BACTROBAN) 2 % Apply 1 application topically 2 (two) times daily. For 7-10 days 15 g 0  . mupirocin ointment (BACTROBAN) 2 % Apply 1 application topically daily. And cover 22 g 0  . naproxen (NAPROSYN) 500 MG tablet Take 500 mg by mouth 2 (two) times daily with a meal.    . NON FORMULARY at bedtime. CPAP    . omeprazole (PRILOSEC) 20 MG capsule Take 20 mg by mouth 2 (two) times daily.     . ONE TOUCH ULTRA TEST test strip     . ONETOUCH DELICA LANCETS 99991111 MISC     . potassium chloride SA (K-DUR,KLOR-CON) 20 MEQ tablet Take 2 tablets (40 mEq total) by mouth daily. 180 tablet 1  . sulfaSALAzine (AZULFIDINE) 500 MG tablet Take 500 mg by mouth 2 (two) times daily.    Marland Kitchen topiramate (TOPAMAX) 100 MG tablet Take 150 mg by mouth at bedtime.     . valsartan-hydrochlorothiazide (DIOVAN-HCT) 160-12.5 MG per tablet Take 1 tablet by mouth daily.    . vitamin B-12 (CYANOCOBALAMIN) 1000 MCG tablet Take 500 mcg by mouth daily.      No current facility-administered medications for this visit.    Allergies:   Review of patient's allergies indicates no known allergies.   Social History:  The patient  reports that she quit smoking about 16 years ago. She has never used smokeless tobacco. She reports that she does not drink alcohol or use illicit drugs.   Family History:  The patient's family history includes Breast cancer in her sister; Cancer in her maternal grandmother and paternal grandfather; Coronary artery disease in her sister; Diabetes in her father, maternal grandmother, mother, paternal grandfather, and sister; Hypertension in her father, maternal grandmother, and mother; Kidney disease in her father.  ROS:  Please see the history of present illness. All other systems are reviewed and otherwise negative.   PHYSICAL EXAM:  VS:  BP 140/80 mmHg  Pulse 80  Ht 5\' 2"  (1.575 m)  Wt 276 lb (125.193 kg)  BMI 50.47 kg/m2 BMI: Body mass index is 50.47 kg/(m^2). Well nourished, well developed obese  AAF, in no acute distress HEENT: normocephalic, atraumatic Neck: no JVD, carotid bruits or masses Cardiac:  normal S1, S2; RRR; no murmurs, rubs, or gallops Lungs:  clear to auscultation bilaterally, no wheezing, rhonchi or rales Abd: soft, nontender, no hepatomegaly, + BS MS: no deformity or atrophy Ext: no edema Skin: warm and dry, no rash Neuro:  moves all extremities spontaneously, no focal abnormalities noted, follows commands Psych: euthymic mood, full affect  EKG:  Done today shows NSR 81bpm, no acute ST-T changes  Recent Labs: 06/24/2015: BUN 17; Creat 0.98; Potassium 3.0*; Sodium 134*  No results found for requested labs within last 365 days.   CrCl cannot be calculated (Patient has no serum creatinine result on file.).   Wt Readings from Last 3 Encounters:  10/22/15 276 lb (125.193 kg)  10/19/15 277 lb (125.646 kg)  06/03/15 289 lb (131.09 kg)     Other studies reviewed: Additional studies/records reviewed today include: summarized above  ASSESSMENT AND PLAN:  1. Chest pain/palpitations - symptoms are mostly atypical. Question whether she is experiencing some sort of arrhythmia causing her symptoms. Will place 30 day event monitor and check labs to exclude progressive anemia, hypokalemia or thyroid dysfunction. We discussed options of either a) starting with monitor as part of eval for chest pain versus b) updating stress test given multiple cardiac risk factors. She has elected the latter. I think this is reasonable given her additional vague symptoms of fatigue/weakness as well as risk factors of HTN, DM, obesity, HLD, & family history of heart disease. She had bilateral knee replacement and does not think she can walk on the treadmill so will proceed with Lexiscan nuclear stress test. Warning signs reviewed with patient. 2. Chronic diastolic CHF - much improved since initiation of Lasix. 3. Essential HTN - continue current regimen for now. Do not want to make any  adjustments until we see what event monitor shows, as this may guide a change in therapy. 4. Hyperlipidemia - continue statin. Lipids are followed by primary care.  5. Morbid obesity - we discussed swimming at the [facility] as an affordable alternative to exercise given her SI joint & knee problems.  Disposition: F/u with Dr. Sallyanne Kuster or APP in 5-6 weeks (after monitor).  Current medicines are reviewed at length with the patient today.  The patient did not have any concerns regarding medicines.  Raechel Ache PA-C 10/22/2015 9:08 AM     [facility] [place] [address] [phone] (office)  [phone] (fax)

## 2015-10-21 NOTE — Telephone Encounter (Signed)
Spoke to patient , patient wanted an appointment/  she states chest discomfort  Off/on for 3 weeks  -Left side She states she weight daily for weight increase  Today's weight 276.4 lbs Yesterday  Weight 278.6 lbs Patient saw primary in Jan  2017--  The symptoms occurred after visit    Schedule for 10/22/15 at 8:30 with Dayna.

## 2015-10-21 NOTE — Telephone Encounter (Signed)
New Message   Patient c/o Palpitations:  High priority if patient c/o lightheadedness and shortness of breath.  1. How long have you been having palpitations? 3  weeks 2. Are you currently experiencing lightheadedness and shortness of breath? No sob   Yes  lightheadedness  3. Have you checked your BP and heart rate? (document readings) no 4. Are you experiencing any other symptoms? Get real hot and heart racing in chest

## 2015-10-22 ENCOUNTER — Ambulatory Visit (INDEPENDENT_AMBULATORY_CARE_PROVIDER_SITE_OTHER): Payer: BLUE CROSS/BLUE SHIELD | Admitting: Physician Assistant

## 2015-10-22 ENCOUNTER — Encounter: Payer: Self-pay | Admitting: Physician Assistant

## 2015-10-22 ENCOUNTER — Telehealth: Payer: Self-pay | Admitting: *Deleted

## 2015-10-22 ENCOUNTER — Ambulatory Visit (INDEPENDENT_AMBULATORY_CARE_PROVIDER_SITE_OTHER): Payer: BLUE CROSS/BLUE SHIELD

## 2015-10-22 VITALS — BP 140/80 | HR 80 | Ht 62.0 in | Wt 276.0 lb

## 2015-10-22 DIAGNOSIS — E785 Hyperlipidemia, unspecified: Secondary | ICD-10-CM

## 2015-10-22 DIAGNOSIS — R002 Palpitations: Secondary | ICD-10-CM

## 2015-10-22 DIAGNOSIS — I10 Essential (primary) hypertension: Secondary | ICD-10-CM

## 2015-10-22 DIAGNOSIS — Z79899 Other long term (current) drug therapy: Secondary | ICD-10-CM

## 2015-10-22 DIAGNOSIS — I5032 Chronic diastolic (congestive) heart failure: Secondary | ICD-10-CM | POA: Diagnosis not present

## 2015-10-22 DIAGNOSIS — R079 Chest pain, unspecified: Secondary | ICD-10-CM

## 2015-10-22 LAB — BASIC METABOLIC PANEL
BUN: 15 mg/dL (ref 7–25)
CO2: 28 mmol/L (ref 20–31)
Calcium: 10.2 mg/dL (ref 8.6–10.4)
Chloride: 98 mmol/L (ref 98–110)
Creat: 0.95 mg/dL (ref 0.50–1.05)
Glucose, Bld: 107 mg/dL — ABNORMAL HIGH (ref 65–99)
Potassium: 3.5 mmol/L (ref 3.5–5.3)
Sodium: 138 mmol/L (ref 135–146)

## 2015-10-22 LAB — CBC
HCT: 36.3 % (ref 36.0–46.0)
Hemoglobin: 12 g/dL (ref 12.0–15.0)
MCH: 27.5 pg (ref 26.0–34.0)
MCHC: 33.1 g/dL (ref 30.0–36.0)
MCV: 83.1 fL (ref 78.0–100.0)
MPV: 9 fL (ref 8.6–12.4)
Platelets: 276 10*3/uL (ref 150–400)
RBC: 4.37 MIL/uL (ref 3.87–5.11)
RDW: 16.3 % — ABNORMAL HIGH (ref 11.5–15.5)
WBC: 7.6 10*3/uL (ref 4.0–10.5)

## 2015-10-22 LAB — TSH: TSH: 0.95 mIU/L

## 2015-10-22 LAB — MAGNESIUM: Magnesium: 2 mg/dL (ref 1.5–2.5)

## 2015-10-22 MED ORDER — POTASSIUM CHLORIDE CRYS ER 20 MEQ PO TBCR: 40.0000 meq | EXTENDED_RELEASE_TABLET | Freq: Two times a day (BID) | ORAL | Status: DC

## 2015-10-22 NOTE — Patient Instructions (Signed)
Medication Instructions:  None  Labwork: BMET, Magnesium, CBC, TSH today  Testing/Procedures: Your physician has requested that you have a lexiscan myoview. For further information please visit [email or web address]. Please follow instruction sheet, as given.  Your physician has recommended that you wear an event monitor. Event monitors are medical devices that record the heart's electrical activity. Doctors most often [place] these monitors to diagnose arrhythmias. Arrhythmias are problems with the speed or rhythm of the heartbeat. The monitor is a small, portable device. You can wear one while you do your normal daily activities. This is usually used to diagnose what is causing palpitations/syncope (passing out).   Follow-Up: Your physician recommends that you schedule a follow-up appointment in: 5-6 weeks after completion of event monitor with Dr. Sallyanne Kuster or an extender.   Any Other Special Instructions Will Be Listed Below (If Applicable).     If you need a refill on your cardiac medications before your next appointment, please call your pharmacy.

## 2015-10-22 NOTE — Telephone Encounter (Signed)
-----   Message from Charlie Pitter, [place] sent at 10/22/2015  4:43 PM EST ----- Please let patient know: - thyroid function normal - magnesium normal - blood counts are normal - BMET is normal except for mildly elevated glucose (has DM) and potassium remains borderline low - with her history of palpitations and being on diuretic, we like it closer to 4.0. please increase KCl to 33meq BID. Repeat BMET when she comes back in for stress test.  Thanks! Dayna Dunn PA-C

## 2015-10-22 NOTE — Telephone Encounter (Signed)
Informed pt of lab results. Pt verbalized understanding and was in agreement with this plan.  

## 2015-10-28 ENCOUNTER — Telehealth (HOSPITAL_COMMUNITY): Payer: Self-pay | Admitting: *Deleted

## 2015-10-28 NOTE — Telephone Encounter (Signed)
Left message on voicemail in reference to upcoming appointment scheduled for 11/02/15. Phone number given for a call back so details instructions can be given. [name], [name]

## 2015-10-29 ENCOUNTER — Telehealth (HOSPITAL_COMMUNITY): Payer: Self-pay | Admitting: *Deleted

## 2015-10-29 NOTE — Telephone Encounter (Signed)
Patient given detailed instructions per Myocardial Perfusion Study Information Sheet for the test on 11/02/15 at 1230. Patient notified to arrive 15 minutes early and that it is imperative to arrive on time for appointment to keep from having the test rescheduled.  If you need to cancel or reschedule your appointment, please call the office within 24 hours of your appointment. Failure to do so may result in a cancellation of your appointment, and a $50 no show fee. Patient verbalized understanding.Sriyan Cutting, [name]

## 2015-11-02 ENCOUNTER — Ambulatory Visit (HOSPITAL_COMMUNITY): Payer: BLUE CROSS/BLUE SHIELD | Attending: Cardiovascular Disease

## 2015-11-02 ENCOUNTER — Other Ambulatory Visit (INDEPENDENT_AMBULATORY_CARE_PROVIDER_SITE_OTHER): Payer: BLUE CROSS/BLUE SHIELD | Admitting: *Deleted

## 2015-11-02 DIAGNOSIS — R079 Chest pain, unspecified: Secondary | ICD-10-CM | POA: Insufficient documentation

## 2015-11-02 DIAGNOSIS — Z79899 Other long term (current) drug therapy: Secondary | ICD-10-CM | POA: Diagnosis not present

## 2015-11-02 DIAGNOSIS — I10 Essential (primary) hypertension: Secondary | ICD-10-CM | POA: Insufficient documentation

## 2015-11-02 DIAGNOSIS — E119 Type 2 diabetes mellitus without complications: Secondary | ICD-10-CM | POA: Diagnosis not present

## 2015-11-02 DIAGNOSIS — R9439 Abnormal result of other cardiovascular function study: Secondary | ICD-10-CM | POA: Insufficient documentation

## 2015-11-02 LAB — BASIC METABOLIC PANEL
BUN: 17 mg/dL (ref 7–25)
CO2: 25 mmol/L (ref 20–31)
Calcium: 9.6 mg/dL (ref 8.6–10.4)
Chloride: 103 mmol/L (ref 98–110)
Creat: 0.79 mg/dL (ref 0.50–1.05)
Glucose, Bld: 94 mg/dL (ref 65–99)
Potassium: 3.7 mmol/L (ref 3.5–5.3)
Sodium: 138 mmol/L (ref 135–146)

## 2015-11-02 MED ORDER — REGADENOSON 0.4 MG/5ML IV SOLN
0.4000 mg | Freq: Once | INTRAVENOUS | Status: AC
  Administered 2015-11-02: 0.4 mg via INTRAVENOUS

## 2015-11-02 MED ORDER — TECHNETIUM TC 99M SESTAMIBI GENERIC - CARDIOLITE
32.8000 | Freq: Once | INTRAVENOUS | Status: AC | PRN
  Administered 2015-11-02: 32.8 via INTRAVENOUS

## 2015-11-03 ENCOUNTER — Telehealth: Payer: Self-pay | Admitting: *Deleted

## 2015-11-03 ENCOUNTER — Ambulatory Visit (HOSPITAL_COMMUNITY): Payer: BLUE CROSS/BLUE SHIELD | Attending: Cardiology

## 2015-11-03 DIAGNOSIS — I10 Essential (primary) hypertension: Secondary | ICD-10-CM

## 2015-11-03 LAB — MYOCARDIAL PERFUSION IMAGING
LV dias vol: 92 mL
LV sys vol: 34 mL
Peak HR: 114 {beats}/min
RATE: 0.3
Rest HR: 85 {beats}/min
SDS: 2
SRS: 2
SSS: 4
TID: 0.98

## 2015-11-03 MED ORDER — TECHNETIUM TC 99M SESTAMIBI GENERIC - CARDIOLITE
32.2000 | Freq: Once | INTRAVENOUS | Status: AC | PRN
  Administered 2015-11-03: 32.2 via INTRAVENOUS

## 2015-11-03 NOTE — Telephone Encounter (Signed)
-----   Message from Charlie Pitter, [place] sent at 11/03/2015  7:53 AM EST ----- Please let patient know potassium looks better but still slightly below where we like it to be (goal 4.0). Please make sure she is taking KCl 19meq BID. If she is, we have two options: - can increase dietary intake of potassium including bananas, squash, yogurt, white beans, sweet potatoes, leafy greens, and avocados, OR - if she is already doing these things, increase KCl to 70meq TID  Regardless of what she chooses, would recheck BMET in about 10-14 days to see where it falls. Dayna Dunn PA-C

## 2015-11-03 NOTE — Telephone Encounter (Signed)
Spoke with pt re: lab results and the need for her Potassium to increase to at least 4.0 range.  Pt stated that she would like to continue with the 40 bid and increase her dietary foods for and recheck her bmet between 11/20/15 - 11/24/15.  Pt stated if her potassium at that time hasn't changed, then she will increase the medication to 40 tid.  Order for repeat lab has been put in EPIC. Pt verbalized understanding.

## 2015-11-16 ENCOUNTER — Other Ambulatory Visit (INDEPENDENT_AMBULATORY_CARE_PROVIDER_SITE_OTHER): Payer: BLUE CROSS/BLUE SHIELD | Admitting: *Deleted

## 2015-11-16 DIAGNOSIS — I10 Essential (primary) hypertension: Secondary | ICD-10-CM | POA: Diagnosis not present

## 2015-11-16 LAB — BASIC METABOLIC PANEL
BUN: 20 mg/dL (ref 7–25)
CO2: 25 mmol/L (ref 20–31)
Calcium: 9.4 mg/dL (ref 8.6–10.4)
Chloride: 103 mmol/L (ref 98–110)
Creat: 0.94 mg/dL (ref 0.50–1.05)
Glucose, Bld: 108 mg/dL — ABNORMAL HIGH (ref 65–99)
Potassium: 3.5 mmol/L (ref 3.5–5.3)
Sodium: 139 mmol/L (ref 135–146)

## 2015-11-16 NOTE — Addendum Note (Signed)
Addended by: Eulis Foster on: 11/16/2015 09:13 AM   Modules accepted: Orders

## 2015-11-17 ENCOUNTER — Telehealth: Payer: Self-pay | Admitting: *Deleted

## 2015-11-17 DIAGNOSIS — I10 Essential (primary) hypertension: Secondary | ICD-10-CM

## 2015-11-17 MED ORDER — POTASSIUM CHLORIDE CRYS ER 20 MEQ PO TBCR: 40.0000 meq | EXTENDED_RELEASE_TABLET | Freq: Three times a day (TID) | ORAL | Status: DC

## 2015-11-17 NOTE — Telephone Encounter (Signed)
-----   Message from Eileen Stanford, PA-C sent at 11/17/2015  7:44 AM EST ----- Potassium now lower than before. Can you clarify what dose of Kdur the patient is taking? If she is actually taking 53mEq TID, than we should increase to 4x day and repeat BMET in 10 days. thanks

## 2015-11-17 NOTE — Telephone Encounter (Signed)
Pt has been made aware of her lab results.  She was taking 20 mg 2 tablet bid.  She will increase it now to 2 tablets tid and repeat labs on 11/27/15. Pt verbalized understanding.

## 2015-11-17 NOTE — Telephone Encounter (Signed)
Pt aware of lab results and repeat lab 11/27/15

## 2015-11-18 ENCOUNTER — Encounter: Payer: Self-pay | Admitting: Gastroenterology

## 2015-11-18 ENCOUNTER — Ambulatory Visit (INDEPENDENT_AMBULATORY_CARE_PROVIDER_SITE_OTHER): Payer: BLUE CROSS/BLUE SHIELD | Admitting: Gastroenterology

## 2015-11-18 VITALS — BP 122/68 | HR 72 | Ht 62.0 in | Wt 273.0 lb

## 2015-11-18 DIAGNOSIS — K219 Gastro-esophageal reflux disease without esophagitis: Secondary | ICD-10-CM | POA: Diagnosis not present

## 2015-11-18 NOTE — Patient Instructions (Signed)
You have GERD without alarm symptoms and so no need for any further testing as long as omeprazole continues to work for you so well.

## 2015-11-18 NOTE — Progress Notes (Signed)
Review of pertinent gastrointestinal problems: 1. Routine risk for colon cancer. Colonoscopy January 2013, Dr. Ardis Hughs, done for routine risk screening. Single small polyp was found, this was not precancerous. [name] was recommended to have repeat colonoscopy at 10 year interval   HPI: This is a   very pleasant    who was referred to me by Shon Baton, MD  to evaluate  chronic GERD .    Chief complaint is chronic GERD  [name] has been on omeprazole for 4-5 years.  For pyrosis, acid regurgitation.  As long as [name] takes it twice a day (before BF and before dinner).  No dysphagia except for very large pills.  No trouble solid foods.  No overt GI bleeding.  Overall weight fluctuates, has been a struggle for a long time.  [name] has no significant abdominal pains  Diagnosed with CHF last year.  No FH of stomach or esophageal cancer.  Review of systems: Pertinent positive and negative review of systems were noted in the above HPI section. Complete review of systems was performed and was otherwise normal.   Past Medical History  Diagnosis Date  . Hyperlipidemia   . GERD (gastroesophageal reflux disease)   . Headache(784.0)     migraine-like  . Degenerative disc disease   . History of UTI   . Sleep apnea     uses CPAP nightly  . Shoulder impingement 04/2012    left  . Asthma     daily and prn inhalers  . Essential hypertension   . Anemia     takes iron supplement  . History of TIA (transient ischemic attack) early 2013    no weakness or deficits  . Partial tear of rotator cuff(726.13)   . Family history of early CAD   . Chronic diastolic CHF (congestive heart failure) ([place])   . Morbid obesity ([place])   . Diabetes mellitus ([place])   . Former tobacco use     Past Surgical History  Procedure Laterality Date  . Umbilical hernia repair  03/28/2002  . Dilation and curettage of uterus      2000  . Anterior cervical decomp/discectomy fusion  02/18/2010    C6-7  . Total knee arthroplasty   09/07/2009    left  . Total knee arthroplasty  10/13/2008    right  . Abdominal hysterectomy  03/28/2002    complete    Current Outpatient Prescriptions  Medication Sig Dispense Refill  . acidophilus (RISAQUAD) CAPS Take 1 capsule by mouth every morning.     Marland Kitchen aspirin 81 MG tablet Take 81 mg by mouth daily as needed for pain.    Marland Kitchen atorvastatin (LIPITOR) 20 MG tablet Take 20 mg by mouth every evening.     . baclofen (LIORESAL) 10 MG tablet Take 20 mg by mouth as needed. Two per week    . beclomethasone (QVAR) 80 MCG/ACT inhaler Inhale 2 puffs into the lungs as needed.     . cyclobenzaprine (FLEXERIL) 10 MG tablet Take 10 mg by mouth 2 (two) times daily.     . ferrous sulfate 325 (65 FE) MG tablet Take 325 mg by mouth 4 (four) times a week. Sunday, Tuesday, Thursday, and saturday    . furosemide (LASIX) 80 MG tablet Take 80 mg by mouth daily.    Marland Kitchen HYDROcodone-acetaminophen (NORCO/VICODIN) 5-325 MG per tablet Take 1 tablet by mouth as needed for moderate pain.     . hydrOXYzine (ATARAX/VISTARIL) 25 MG tablet Take 25 mg by mouth every 8 (  eight) hours as needed. For itching.    . loratadine (CLARITIN) 10 MG tablet Take 20 mg by mouth daily.     Marland Kitchen LORazepam (ATIVAN) 0.5 MG tablet Take 0.5 mg by mouth as needed for anxiety.     . metFORMIN (GLUCOPHAGE) 500 MG tablet 500 mg.     . Multiple Vitamin (MULITIVITAMIN WITH MINERALS) TABS Take 1 tablet by mouth daily.    . mupirocin cream (BACTROBAN) 2 % Apply 1 application topically 2 (two) times daily. For 7-10 days 15 g 0  . mupirocin ointment (BACTROBAN) 2 % Apply 1 application topically daily. And cover 22 g 0  . naproxen (NAPROSYN) 500 MG tablet Take 500 mg by mouth 2 (two) times daily with a meal.    . NON FORMULARY at bedtime. CPAP    . omeprazole (PRILOSEC) 20 MG capsule Take 20 mg by mouth 2 (two) times daily.     . ONE TOUCH ULTRA TEST test strip     . ONETOUCH DELICA LANCETS 99991111 MISC     . potassium chloride SA (K-DUR,KLOR-CON) 20 MEQ tablet  Take 2 tablets (40 mEq total) by mouth 3 (three) times daily. 540 tablet 0  . sulfaSALAzine (AZULFIDINE) 500 MG tablet Take 500 mg by mouth 2 (two) times daily.    Marland Kitchen topiramate (TOPAMAX) 100 MG tablet Take 150 mg by mouth at bedtime.     . valsartan-hydrochlorothiazide (DIOVAN-HCT) 160-12.5 MG per tablet Take 1 tablet by mouth daily.    . vitamin B-12 (CYANOCOBALAMIN) 1000 MCG tablet Take 500 mcg by mouth daily.      No current facility-administered medications for this visit.    Allergies as of 11/18/2015  . (No Known Allergies)    Family History  Problem Relation Age of Onset  . Diabetes Mother   . Kidney disease Father   . Breast cancer Sister   . Diabetes Sister   . Coronary artery disease Sister   . Hypertension Mother   . Hypertension Father   . Diabetes Father   . Cancer Maternal Grandmother   . Diabetes Maternal Grandmother   . Hypertension Maternal Grandmother   . Diabetes Paternal Grandfather   . Cancer Paternal Grandfather     Social History   Social History  . Marital Status: Married    Spouse Name: N/A  . Number of Children: 5  . Years of Education: N/A   Occupational History  . PROJECT ANALYST    Social History Main Topics  . Smoking status: Former Smoker    Quit date: 10/02/1999  . Smokeless tobacco: Never Used  . Alcohol Use: No  . Drug Use: No  . Sexual Activity: Yes    Birth Control/ Protection: None   Other Topics Concern  . Not on file   Social History Narrative     Physical Exam: Ht 5\' 2"  (1.575 m)  Wt 273 lb (123.832 kg)  BMI 49.92 kg/m2 Constitutional: generally well-appearing Psychiatric: alert and oriented x3 Eyes: extraocular movements intact Mouth: oral pharynx moist, no lesions Neck: supple no lymphadenopathy Cardiovascular: heart regular rate and rhythm Lungs: clear to auscultation bilaterally Abdomen: soft, nontender, nondistended, no obvious ascites, no peritoneal signs, normal bowel sounds Extremities: no lower  extremity edema bilaterally Skin: no lesions on visible extremities   Assessment and plan: 55 y.o. female with  Chronic GERD, no alarm symptoms  Her GERD symptoms are under good control on over-the-counter strength proton pump inhibitor twice daily. [name] has no alarm symptoms. [name] does not need  endoscopic evaluation to screen for Barrett's. [name] should continue proton pump inhibitor at her current dosing, regimen. Losing weight would likely help her GERD as well. [name] was relieved to hear [name] doesn't need any other testing. [name] knows to call here if [name] has any further questions or concerns   Owens Loffler, MD [facility] 11/18/2015, 1:54 PM  Cc: Shon Baton, MD

## 2015-11-27 ENCOUNTER — Other Ambulatory Visit (INDEPENDENT_AMBULATORY_CARE_PROVIDER_SITE_OTHER): Payer: BLUE CROSS/BLUE SHIELD | Admitting: *Deleted

## 2015-11-27 DIAGNOSIS — I10 Essential (primary) hypertension: Secondary | ICD-10-CM | POA: Diagnosis not present

## 2015-11-27 LAB — BASIC METABOLIC PANEL
BUN: 18 mg/dL (ref 7–25)
CO2: 28 mmol/L (ref 20–31)
Calcium: 9.7 mg/dL (ref 8.6–10.4)
Chloride: 102 mmol/L (ref 98–110)
Creat: 0.84 mg/dL (ref 0.50–1.05)
Glucose, Bld: 95 mg/dL (ref 65–99)
Potassium: 3.6 mmol/L (ref 3.5–5.3)
Sodium: 139 mmol/L (ref 135–146)

## 2015-11-29 DIAGNOSIS — I5032 Chronic diastolic (congestive) heart failure: Secondary | ICD-10-CM | POA: Insufficient documentation

## 2015-11-29 NOTE — Progress Notes (Signed)
Patient ID:  [name], female   DOB: 22-May-1961, 55 y.o.   MRN: [id number]    Cardiology Office Note    Date:  11/30/2015   ID:  [name], DOB 1961/05/18, MRN [id number]  PCP:  Bonnie Reel, MD  Cardiologist:   Bonnie Klein, MD   Chief Complaint  Patient presents with  . Follow-up    Event Monitor  pt c/o fluid retention in legs/ankles    History of Present Illness:  [name] is a 55 y.o. female with history of chronic diastolic CHF, morbid obesity, OSA, TIA, GERD, HTN, HLD, DM, asthma, anemia, former tobacco abuse who presents for f/u of event monitor and nuclear stress test ordered for atypical chest pain and palpitations. The stress test showed a small anterior defect probably due to breast attenuation and the event monitor was normal. She has a lot of problems with sacroiliac joint related pain and takes nosteroidal anti-inflammatory drugs twice daily. She has noticed lower extremity edema and some weight gain   Past Medical History  Diagnosis Date  . Hyperlipidemia   . GERD (gastroesophageal reflux disease)   . Headache(784.0)     migraine-like  . Degenerative disc disease   . History of UTI   . Sleep apnea     uses CPAP nightly  . Shoulder impingement 04/2012    left  . Asthma     daily and prn inhalers  . Essential hypertension   . Anemia     takes iron supplement  . History of TIA (transient ischemic attack) early 2013    no weakness or deficits  . Partial tear of rotator cuff(726.13)   . Family history of early CAD   . Chronic diastolic CHF (congestive heart failure) ([place])   . Morbid obesity ([place])   . Diabetes mellitus ([place])   . Former tobacco use     Past Surgical History  Procedure Laterality Date  . Umbilical hernia repair  03/28/2002  . Dilation and curettage of uterus      2000  . Anterior cervical decomp/discectomy fusion  02/18/2010    C6-7  . Total knee arthroplasty  09/07/2009    left  . Total knee arthroplasty   10/13/2008    right  . Abdominal hysterectomy  03/28/2002    complete    Outpatient Prescriptions Prior to Visit  Medication Sig Dispense Refill  . acidophilus (RISAQUAD) CAPS Take 1 capsule by mouth every morning.     Marland Kitchen aspirin 81 MG tablet Take 81 mg by mouth daily as needed for pain.    Marland Kitchen atorvastatin (LIPITOR) 20 MG tablet Take 20 mg by mouth every evening.     . baclofen (LIORESAL) 10 MG tablet Take 20 mg by mouth as needed. Two per week    . beclomethasone (QVAR) 80 MCG/ACT inhaler Inhale 2 puffs into the lungs as needed.     . cyclobenzaprine (FLEXERIL) 10 MG tablet Take 10 mg by mouth 2 (two) times daily.     . ferrous sulfate 325 (65 FE) MG tablet Take 325 mg by mouth 4 (four) times a week. Sunday, Tuesday, Thursday, and saturday    . furosemide (LASIX) 80 MG tablet Take 80 mg by mouth daily.    Marland Kitchen HYDROcodone-acetaminophen (NORCO/VICODIN) 5-325 MG per tablet Take 1 tablet by mouth as needed for moderate pain.     . hydrOXYzine (ATARAX/VISTARIL) 25 MG tablet Take 25 mg by mouth every 8 (eight) hours as needed. For itching.    Marland Kitchen  loratadine (CLARITIN) 10 MG tablet Take 20 mg by mouth daily.     Marland Kitchen LORazepam (ATIVAN) 0.5 MG tablet Take 0.5 mg by mouth as needed for anxiety.     . metFORMIN (GLUCOPHAGE) 500 MG tablet 500 mg.     . Multiple Vitamin (MULITIVITAMIN WITH MINERALS) TABS Take 1 tablet by mouth daily.    . mupirocin cream (BACTROBAN) 2 % Apply 1 application topically 2 (two) times daily. For 7-10 days 15 g 0  . mupirocin ointment (BACTROBAN) 2 % Apply 1 application topically daily. And cover 22 g 0  . naproxen (NAPROSYN) 500 MG tablet Take 500 mg by mouth 2 (two) times daily with a meal.    . NON FORMULARY at bedtime. CPAP    . omeprazole (PRILOSEC) 20 MG capsule Take 20 mg by mouth 2 (two) times daily.     . ONE TOUCH ULTRA TEST test strip     . ONETOUCH DELICA LANCETS 99991111 MISC     . potassium chloride SA (K-DUR,KLOR-CON) 20 MEQ tablet Take 2 tablets (40 mEq total) by mouth 3  (three) times daily. 540 tablet 0  . sulfaSALAzine (AZULFIDINE) 500 MG tablet Take 1,000 mg by mouth 2 (two) times daily.     Marland Kitchen topiramate (TOPAMAX) 100 MG tablet Take 150 mg by mouth at bedtime.     . valsartan-hydrochlorothiazide (DIOVAN-HCT) 160-12.5 MG per tablet Take 1 tablet by mouth daily.    . vitamin B-12 (CYANOCOBALAMIN) 1000 MCG tablet Take 500 mcg by mouth daily.      No facility-administered medications prior to visit.     Allergies:   Review of patient's allergies indicates no known allergies.   Social History   Social History  . Marital Status: Married    Spouse Name: N/A  . Number of Children: 5  . Years of Education: N/A   Occupational History  . PROJECT ANALYST    Social History Main Topics  . Smoking status: Former Smoker    Quit date: 10/02/1999  . Smokeless tobacco: Never Used  . Alcohol Use: No  . Drug Use: No  . Sexual Activity: Yes    Birth Control/ Protection: None   Other Topics Concern  . None   Social History Narrative     Family History:  The patient's family history includes Breast cancer in her sister; Cancer in her maternal grandmother and paternal grandfather; Coronary artery disease in her sister; Diabetes in her father, maternal grandmother, mother, paternal grandfather, and sister; Hypertension in her father, maternal grandmother, and mother; Kidney disease in her father.   ROS:   Please see the history of present illness.    ROS All other systems reviewed and are negative.   PHYSICAL EXAM:   VS:  BP 114/76 mmHg  Pulse 80  Ht 5\' 2"  (1.575 m)  Wt 126.916 kg (279 lb 12.8 oz)  BMI 51.16 kg/m2   GEN: Well nourished, well developed, in no acute distress HEENT: normal Neck: no JVD, carotid bruits, or masses Cardiac: RRR; no murmurs, rubs, or gallops, 1+ ankle edema  Respiratory:  clear to auscultation bilaterally, normal work of breathing GI: soft, nontender, nondistended, + BS MS: no deformity or atrophy Skin: warm and dry, no  rash Neuro:  Alert and Oriented x 3, Strength and sensation are intact Psych: euthymic mood, full affect  Wt Readings from Last 3 Encounters:  11/30/15 126.916 kg (279 lb 12.8 oz)  11/18/15 123.832 kg (273 lb)  10/22/15 125.193 kg (276 lb)  Studies/Labs Reviewed:   EKG:  EKG is not ordered today.    Recent Labs: 10/22/2015: Hemoglobin 12.0; Magnesium 2.0; Platelets 276; TSH 0.95 11/27/2015: BUN 18; Creat 0.84; Potassium 3.6; Sodium 139   Lipid Panel    Component Value Date/Time   CHOL 143 03/12/2013 0200   TRIG 128 03/12/2013 0200   HDL 42 03/12/2013 0200   CHOLHDL 3.4 03/12/2013 0200   VLDL 26 03/12/2013 0200   LDLCALC 75 03/12/2013 0200     ASSESSMENT:    1. Chronic diastolic heart failure ([place])   2. Essential hypertension   3. Hyperlipidemia   4. Morbid obesity, unspecified obesity type ([place])   5. Sleep apnea   6. Chest pain with low risk of acute coronary syndrome - most likely musculoskeletal, negative nuc study      PLAN:  In order of problems listed above:  1. CHF: I think the slight additional weight gain and leg edema can be explained by NSAID therapy. I think she should try to take this medicine more sparingly. Unfortunate she does not have a lot of options. She is also taking narcotics and has exhausted some of the nonpharmacological interventions such as TENS unit without success. May need to temporarily increase diuretics dose intermittently to prevent additional fluid gain 2. HTN: Good blood pressure control 3. HLP: Need to get updated lipid profile from her primary care physician 4. Obesity: May need to consider bariatric surgery 5. OSA: Reports compliance with CPAP 6. Chest pain: Suspect that her symptoms were musculoskeletal. I reviewed her nuclear images and suspect that she has shifting breast attenuation artifact. If the issue returns and needs to be clarified, the best option will be to proceed directly to coronary angiography.    Medication  Adjustments/Labs and Tests Ordered: Current medicines are reviewed at length with the patient today.  Concerns regarding medicines are outlined above.  Medication changes, Labs and Tests ordered today are listed in the Patient Instructions below. Patient Instructions  Your physician recommends that you continue on your current medications as directed. Please refer to the Current Medication list given to you today.  Dr Sallyanne Kuster recommends that you schedule a follow-up appointment in 1 year. You will receive a reminder letter in the mail two months in advance. If you don't receive a letter, please call our office to schedule the follow-up appointment.  If you need a refill on your cardiac medications before your next appointment, please call your pharmacy.       Mikael Spray, MD  11/30/2015 1:31 PM    [facility] [address], [place], [place]  [address] Phone: [phone]; Fax: [phone]

## 2015-11-30 ENCOUNTER — Encounter: Payer: Self-pay | Admitting: Cardiovascular Disease

## 2015-11-30 ENCOUNTER — Ambulatory Visit (INDEPENDENT_AMBULATORY_CARE_PROVIDER_SITE_OTHER): Payer: BLUE CROSS/BLUE SHIELD | Admitting: Cardiovascular Disease

## 2015-11-30 VITALS — BP 114/76 | HR 80 | Ht 62.0 in | Wt 279.8 lb

## 2015-11-30 DIAGNOSIS — E785 Hyperlipidemia, unspecified: Secondary | ICD-10-CM | POA: Diagnosis not present

## 2015-11-30 DIAGNOSIS — I5032 Chronic diastolic (congestive) heart failure: Secondary | ICD-10-CM

## 2015-11-30 DIAGNOSIS — R079 Chest pain, unspecified: Secondary | ICD-10-CM

## 2015-11-30 DIAGNOSIS — I10 Essential (primary) hypertension: Secondary | ICD-10-CM | POA: Diagnosis not present

## 2015-11-30 DIAGNOSIS — G4730 Sleep apnea, unspecified: Secondary | ICD-10-CM

## 2015-11-30 NOTE — Patient Instructions (Signed)
Your physician recommends that you continue on your current medications as directed. Please refer to the Current Medication list given to you today.  Dr Croitoru recommends that you schedule a follow-up appointment in 1 year. You will receive a reminder letter in the mail two months in advance. If you don't receive a letter, please call our office to schedule the follow-up appointment.  If you need a refill on your cardiac medications before your next appointment, please call your pharmacy. 

## 2015-12-15 ENCOUNTER — Ambulatory Visit (INDEPENDENT_AMBULATORY_CARE_PROVIDER_SITE_OTHER): Payer: BLUE CROSS/BLUE SHIELD

## 2015-12-15 ENCOUNTER — Ambulatory Visit (INDEPENDENT_AMBULATORY_CARE_PROVIDER_SITE_OTHER): Payer: BLUE CROSS/BLUE SHIELD | Admitting: Physician Assistant

## 2015-12-15 VITALS — BP 94/60 | HR 95 | Temp 97.6°F | Resp 18 | Ht 62.0 in | Wt 278.0 lb

## 2015-12-15 DIAGNOSIS — S29019A Strain of muscle and tendon of unspecified wall of thorax, initial encounter: Secondary | ICD-10-CM

## 2015-12-15 DIAGNOSIS — M546 Pain in thoracic spine: Secondary | ICD-10-CM | POA: Diagnosis not present

## 2015-12-15 DIAGNOSIS — S29012A Strain of muscle and tendon of back wall of thorax, initial encounter: Secondary | ICD-10-CM

## 2015-12-15 LAB — POC MICROSCOPIC URINALYSIS (UMFC): Mucus: ABSENT

## 2015-12-15 LAB — POCT URINALYSIS DIP (MANUAL ENTRY)
Bilirubin, UA: NEGATIVE
Blood, UA: NEGATIVE
Glucose, UA: NEGATIVE
Ketones, POC UA: NEGATIVE
Nitrite, UA: NEGATIVE
Protein Ur, POC: NEGATIVE
Spec Grav, UA: 1.01
Urobilinogen, UA: 0.2
pH, UA: 5

## 2015-12-15 MED ORDER — CYCLOBENZAPRINE HCL 5 MG PO TABS: 5.0000 mg | ORAL_TABLET | Freq: Three times a day (TID) | ORAL | Status: DC | PRN

## 2015-12-15 NOTE — Progress Notes (Signed)
[facility] [address] [place] [address] [phone]  Date:  12/15/2015   Name:  [name]   DOB:  10/02/1960   MRN:  [id number]  PCP:  Precious Reel, MD   Chief Complaint  Patient presents with  . Back Pain    since yesterday, leading to sob, no known injury    History of Present Illness:  [name] is a 55 y.o. female patient who presents to [facility] for left sided back pain. Yesterday, she started to have left sided back pain.  No known trauma, cough, or dizziness.  She has sob secondary to the pain of of her left sided back pain.  No hx of fever.  No hematuria, dysuria, or frequency.     Patient Active Problem List   Diagnosis Date Noted  . Chronic diastolic heart failure ([place]) 11/29/2015  . Dyspnea 06/03/2015  . Lung nodule seen on imaging study, CTA of chest, 6 mm nodule, followed by Dr. Virgina Jock 03/25/2013  . Family history of coronary artery disease 03/12/2013  . Morbid obesity ([place]) 03/12/2013  . Chest pain with low risk of acute coronary syndrome - most likely musculoskeletal, negative nuc study 03/11/2013  . Full thickness rotator cuff tear 05/15/2012  . Hyperlipidemia   . Degenerative disc disease   . Sleep apnea   . Hypertension   . History of TIA (transient ischemic attack) 9/13   . Partial tear of rotator cuff(726.13)   . Shoulder impingement 04/12/2012    Past Medical History  Diagnosis Date  . Hyperlipidemia   . GERD (gastroesophageal reflux disease)   . Headache(784.0)     migraine-like  . Degenerative disc disease   . History of UTI   . Sleep apnea     uses CPAP nightly  . Shoulder impingement 04/2012    left  . Asthma     daily and prn inhalers  . Essential hypertension   . Anemia     takes iron supplement  . History of TIA (transient ischemic attack) early 2013    no weakness or deficits  . Partial tear of rotator cuff(726.13)   . Family history of early CAD   . Chronic diastolic CHF (congestive heart  failure) ([place])   . Morbid obesity ([place])   . Diabetes mellitus ([place])   . Former tobacco use     Past Surgical History  Procedure Laterality Date  . Umbilical hernia repair  03/28/2002  . Dilation and curettage of uterus      2000  . Anterior cervical decomp/discectomy fusion  02/18/2010    C6-7  . Total knee arthroplasty  09/07/2009    left  . Total knee arthroplasty  10/13/2008    right  . Abdominal hysterectomy  03/28/2002    complete    Social History  Substance Use Topics  . Smoking status: Former Smoker    Quit date: 10/02/1999  . Smokeless tobacco: Never Used  . Alcohol Use: No    Family History  Problem Relation Age of Onset  . Diabetes Mother   . Kidney disease Father   . Breast cancer Sister   . Diabetes Sister   . Coronary artery disease Sister   . Hypertension Mother   . Hypertension Father   . Diabetes Father   . Cancer Maternal Grandmother   . Diabetes Maternal Grandmother   . Hypertension Maternal Grandmother   . Diabetes Paternal Grandfather   . Cancer Paternal Grandfather  No Known Allergies  Medication list has been reviewed and updated.  Current Outpatient Prescriptions on File Prior to Visit  Medication Sig Dispense Refill  . aspirin 81 MG tablet Take 81 mg by mouth daily as needed for pain.    Marland Kitchen atorvastatin (LIPITOR) 20 MG tablet Take 20 mg by mouth every evening.     . baclofen (LIORESAL) 10 MG tablet Take 20 mg by mouth as needed. Two per week    . beclomethasone (QVAR) 80 MCG/ACT inhaler Inhale 2 puffs into the lungs as needed.     . cyclobenzaprine (FLEXERIL) 10 MG tablet Take 10 mg by mouth 2 (two) times daily.     . ferrous sulfate 325 (65 FE) MG tablet Take 325 mg by mouth 4 (four) times a week. Sunday, Tuesday, Thursday, and saturday    . furosemide (LASIX) 80 MG tablet Take 80 mg by mouth daily.    Marland Kitchen HYDROcodone-acetaminophen (NORCO/VICODIN) 5-325 MG per tablet Take 1 tablet by mouth as needed for moderate pain.     . hydrOXYzine  (ATARAX/VISTARIL) 25 MG tablet Take 25 mg by mouth every 8 (eight) hours as needed. For itching.    . loratadine (CLARITIN) 10 MG tablet Take 20 mg by mouth daily.     Marland Kitchen LORazepam (ATIVAN) 0.5 MG tablet Take 0.5 mg by mouth as needed for anxiety.     . metFORMIN (GLUCOPHAGE) 500 MG tablet 500 mg.     . Multiple Vitamin (MULITIVITAMIN WITH MINERALS) TABS Take 1 tablet by mouth daily.    . mupirocin cream (BACTROBAN) 2 % Apply 1 application topically 2 (two) times daily. For 7-10 days 15 g 0  . mupirocin ointment (BACTROBAN) 2 % Apply 1 application topically daily. And cover 22 g 0  . naproxen (NAPROSYN) 500 MG tablet Take 500 mg by mouth 2 (two) times daily with a meal.    . NON FORMULARY at bedtime. CPAP    . omeprazole (PRILOSEC) 20 MG capsule Take 20 mg by mouth 2 (two) times daily.     . ONE TOUCH ULTRA TEST test strip     . ONETOUCH DELICA LANCETS 99991111 MISC     . potassium chloride SA (K-DUR,KLOR-CON) 20 MEQ tablet Take 2 tablets (40 mEq total) by mouth 3 (three) times daily. 540 tablet 0  . sulfaSALAzine (AZULFIDINE) 500 MG tablet Take 1,000 mg by mouth 2 (two) times daily.     Marland Kitchen topiramate (TOPAMAX) 100 MG tablet Take 150 mg by mouth at bedtime.     . valsartan-hydrochlorothiazide (DIOVAN-HCT) 160-12.5 MG per tablet Take 1 tablet by mouth daily.    . vitamin B-12 (CYANOCOBALAMIN) 1000 MCG tablet Take 500 mcg by mouth daily.     Marland Kitchen acidophilus (RISAQUAD) CAPS Take 1 capsule by mouth every morning.      No current facility-administered medications on file prior to visit.    ROS ROS otherwise unremarkable unless listed above.  Physical Examination: BP 94/60 mmHg  Pulse 95  Temp(Src) 97.6 F (36.4 C)  Resp 18  Ht 5\' 2"  (1.575 m)  Wt 278 lb (126.1 kg)  BMI 50.83 kg/m2  SpO2 98% Ideal Body Weight: Weight in (lb) to have BMI = 25: 136.4  Physical Exam  Constitutional: She is oriented to person, place, and time. She appears well-developed and well-nourished. No distress.  HENT:   Head: Normocephalic and atraumatic.  Right Ear: External ear normal.  Left Ear: External ear normal.  Eyes: Conjunctivae and EOM are normal. Pupils are equal,  round, and reactive to light.  Cardiovascular: Normal rate and regular rhythm.  Exam reveals no gallop and no friction rub.   No murmur heard. Pulmonary/Chest: Effort normal. No apnea. No respiratory distress. She has no decreased breath sounds. She has no wheezes. She has no rhonchi.  Musculoskeletal:  No spinous tenderness.  No obvious erythema, ecchymosis, or swelling.  Tenderness along the upper left posterior thoracic, mostly during inspiration.  Normal rom of upper extremity.  Normal back rom with lateral deviation, forward flexion, and torso rotation.  Neurological: She is alert and oriented to person, place, and time.  Skin: She is not diaphoretic.  Psychiatric: She has a normal mood and affect. Her behavior is normal.     Results for orders placed or performed in visit on 12/15/15  POCT Microscopic Urinalysis ([facility])  Result Value Ref Range   WBC,UR,HPF,POC Many (A) None WBC/hpf   RBC,UR,HPF,POC None None RBC/hpf   Bacteria Moderate (A) None, Too numerous to count   Mucus Absent Absent   Epithelial Cells, UR Per Microscopy Moderate (A) None, Too numerous to count cells/hpf  POCT urinalysis dipstick  Result Value Ref Range   Color, UA yellow yellow   Clarity, UA cloudy (A) clear   Glucose, UA negative negative   Bilirubin, UA negative negative   Ketones, POC UA negative negative   Spec Grav, UA 1.010    Blood, UA negative negative   pH, UA 5.0    Protein Ur, POC negative negative   Urobilinogen, UA 0.2    Nitrite, UA Negative Negative   Leukocytes, UA small (1+) (A) Negative    Dg Ribs Unilateral W/chest Left  12/15/2015  CLINICAL DATA:  55 year old presenting with 1 day history of left rib pain. No known injuries. EXAM: LEFT RIBS AND CHEST - 3+ VIEW COMPARISON:  Bone window images from CT chest 10/05/2015 and  earlier. Two-view chest x-ray 03/11/2013. FINDINGS: No fractures identified involving the left ribs. No intrinsic osseous abnormalities. Suboptimal inspiration accounts for crowded bronchovascular markings, especially in the bases, and accentuates the cardiac silhouette. Taking this into account, cardiomediastinal silhouette unremarkable and unchanged. Lungs clear. Bronchovascular markings normal. Pulmonary vascularity normal. No visible pleural effusions. No pneumothorax. Slight thoracolumbar dextroscoliosis, unchanged. IMPRESSION: 1. No left rib fracture identified. No intrinsic osseous abnormalities. 2. Suboptimal inspiration.  No acute cardiopulmonary disease. Electronically Signed   By: Evangeline Dakin [name]   On: 12/15/2015 14:13     Assessment and Plan: ASLEY MAPA is a 55 y.o. female who is here today for left sided back pain. Advised ice, stretches and muscle relaxant.  She will return to clinic in 7-10 days if her symptoms do not resolve.  Strain of thoracic region, initial encounter - Plan: cyclobenzaprine (FLEXERIL) 5 MG tablet  Left-sided thoracic back pain - Plan: POCT Microscopic Urinalysis ([facility]), POCT urinalysis dipstick, DG Ribs Unilateral W/Chest Left, cyclobenzaprine (FLEXERIL) 5 MG tablet   Ivar Drape, PA-C [facility] 12/15/2015 1:42 PM

## 2015-12-15 NOTE — Patient Instructions (Addendum)
  IF you received an x-ray today, you will receive an invoice from [facility]. Please contact [facility] at [phone] with questions or concerns regarding your invoice.   IF you received labwork today, you will receive an invoice from [employer]. Please contact [facility] at [phone] with questions or concerns regarding your invoice.   Our billing staff will not be able to assist you with questions regarding bills from these companies.  You will be contacted with the lab results as soon as they are available. The fastest way to get your results is to activate your My Chart account. Instructions are located on the last page of this paperwork. If you have not heard from [place] regarding the results in 2 weeks, please contact this office.    Please hydrate well. I would like you to try a muscle relaxer at this time.  You can also take an anti-inflammatory over the counter.  Please do stretches three times per day.  Heat just before stretching.  It is important to follow up the stretch with ice.   Please return in 7 days, if your pain has not improved.  Sooner, if you continue to have the pain.

## 2016-03-26 ENCOUNTER — Other Ambulatory Visit: Payer: Self-pay | Admitting: Physician Assistant

## 2016-03-28 ENCOUNTER — Other Ambulatory Visit: Payer: Self-pay | Admitting: Physician Assistant

## 2016-03-29 ENCOUNTER — Other Ambulatory Visit: Payer: Self-pay | Admitting: Internal Medicine

## 2016-03-29 DIAGNOSIS — Z1231 Encounter for screening mammogram for malignant neoplasm of breast: Secondary | ICD-10-CM

## 2016-04-15 ENCOUNTER — Ambulatory Visit
Admission: RE | Admit: 2016-04-15 | Discharge: 2016-04-15 | Disposition: A | Discharge status: Home / Self Care | Payer: BLUE CROSS/BLUE SHIELD | Source: Ambulatory Visit | Attending: Internal Medicine | Admitting: Internal Medicine

## 2016-04-15 DIAGNOSIS — R911 Solitary pulmonary nodule: Secondary | ICD-10-CM

## 2016-04-18 ENCOUNTER — Ambulatory Visit
Admission: RE | Admit: 2016-04-18 | Discharge: 2016-04-18 | Disposition: A | Discharge status: Home / Self Care | Payer: BLUE CROSS/BLUE SHIELD | Source: Ambulatory Visit | Attending: Internal Medicine | Admitting: Internal Medicine

## 2016-04-18 DIAGNOSIS — Z1231 Encounter for screening mammogram for malignant neoplasm of breast: Secondary | ICD-10-CM

## 2016-05-31 LAB — HEPATIC FUNCTION PANEL
ALT: 12 U/L (ref 7–35)
AST: 15 U/L (ref 13–35)
Alkaline Phosphatase: 87 U/L (ref 25–125)
Bilirubin, Total: 0.3 mg/dL

## 2016-05-31 LAB — BASIC METABOLIC PANEL
BUN: 18 mg/dL (ref 4–21)
Creatinine: 0.8 mg/dL (ref 0.5–1.1)
Glucose: 100 mg/dL
Potassium: 4.1 mmol/L (ref 3.4–5.3)
Sodium: 139 mmol/L (ref 137–147)

## 2016-05-31 LAB — CBC AND DIFFERENTIAL
HCT: 34 % — AB (ref 36–46)
Hemoglobin: 10.7 g/dL — AB (ref 12.0–16.0)
Platelets: 279 10*3/uL (ref 150–399)
WBC: 8 10^3/mL

## 2016-06-14 ENCOUNTER — Other Ambulatory Visit: Payer: Self-pay | Admitting: Cardiovascular Disease

## 2016-06-14 NOTE — Telephone Encounter (Signed)
Rx request sent to pharmacy.  

## 2016-06-16 ENCOUNTER — Ambulatory Visit (INDEPENDENT_AMBULATORY_CARE_PROVIDER_SITE_OTHER): Payer: BLUE CROSS/BLUE SHIELD | Admitting: Cardiovascular Disease

## 2016-06-16 ENCOUNTER — Encounter: Payer: Self-pay | Admitting: Cardiovascular Disease

## 2016-06-16 VITALS — BP 128/72 | HR 90 | Ht 62.0 in | Wt 265.0 lb

## 2016-06-16 DIAGNOSIS — R0789 Other chest pain: Secondary | ICD-10-CM

## 2016-06-16 DIAGNOSIS — I10 Essential (primary) hypertension: Secondary | ICD-10-CM

## 2016-06-16 DIAGNOSIS — I5032 Chronic diastolic (congestive) heart failure: Secondary | ICD-10-CM | POA: Diagnosis not present

## 2016-06-16 DIAGNOSIS — G4733 Obstructive sleep apnea (adult) (pediatric): Secondary | ICD-10-CM | POA: Diagnosis not present

## 2016-06-16 MED ORDER — VALSARTAN 160 MG PO TABS: 160.0000 mg | ORAL_TABLET | Freq: Every day | ORAL | 11 refills | Status: DC

## 2016-06-16 NOTE — Progress Notes (Signed)
Patient ID: [name], female   DOB: 04-01-1961, 55 y.o.   MRN: [id number]    Cardiology Office Note    Date:  06/16/2016   ID:  [name], DOB 10-Feb-1961, MRN [id number]  PCP:  Precious Reel, MD  Cardiologist:   Sanda Klein, MD   Chief Complaint  Patient presents with  . Follow-up    1 Year.    History of Present Illness:  [name] is a 55 y.o. female with history of chronic diastolic CHF, morbid obesity, OSA, TIA, GERD, HTN, HLD, DM, asthma, anemia, former tobacco abuse who presents for f/u.  Her nuclear stress test showed a small anterior defect probably due to breast attenuation and an event monitor was normal. She has a lot of problems with sacroiliac joint related pain and had worsening blood pressure and edema due to nonsteroidal anti-inflammatory drugs. She is no longer taking naproxen. She has recently started treatment with a biological, cosentyx. It is too early to know if it's helping. She has started walking in the neighborhood. Walking does not cause her atypical chest discomfort, this is a sharp twinge that happens unexpectedly at rest..  Her blood pressure has been lower. Occasionally her systolic blood pressures in the double digits. She feels tired and a little dizzy. She has been taking half of valsartan/HCTZ on most days. She has at most trace pedal edema now.   Past Medical History:  Diagnosis Date  . Anemia    takes iron supplement  . Asthma    daily and prn inhalers  . Chronic diastolic CHF (congestive heart failure) ([place])   . Degenerative disc disease   . Diabetes mellitus ([place])   . Essential hypertension   . Family history of early CAD   . Former tobacco use   . GERD (gastroesophageal reflux disease)   . Headache(784.0)    migraine-like  . History of TIA (transient ischemic attack) early 2013   no weakness or deficits  . History of UTI   . Hyperlipidemia   . Morbid obesity ([place])   . Partial tear of rotator cuff(726.13)     . Shoulder impingement 04/2012   left  . Sleep apnea    uses CPAP nightly    Past Surgical History:  Procedure Laterality Date  . ABDOMINAL HYSTERECTOMY  03/28/2002   complete  . ANTERIOR CERVICAL DECOMP/DISCECTOMY FUSION  02/18/2010   C6-7  . DILATION AND CURETTAGE OF UTERUS     2000  . TOTAL KNEE ARTHROPLASTY  09/07/2009   left  . TOTAL KNEE ARTHROPLASTY  10/13/2008   right  . UMBILICAL HERNIA REPAIR  03/28/2002    Outpatient Medications Prior to Visit  Medication Sig Dispense Refill  . acidophilus (RISAQUAD) CAPS Take 1 capsule by mouth every morning.     Marland Kitchen aspirin 81 MG tablet Take 81 mg by mouth daily as needed for pain.    Marland Kitchen atorvastatin (LIPITOR) 20 MG tablet Take 20 mg by mouth every evening.     . baclofen (LIORESAL) 10 MG tablet Take 20 mg by mouth as needed. Two per week    . beclomethasone (QVAR) 80 MCG/ACT inhaler Inhale 2 puffs into the lungs as needed.     . diclofenac sodium (VOLTAREN) 1 % GEL Apply topically 4 (four) times daily.    . ferrous sulfate 325 (65 FE) MG tablet Take 325 mg by mouth 4 (four) times a week. Sunday, Tuesday, Thursday, and saturday    . HYDROcodone-acetaminophen (NORCO/VICODIN) 5-325 MG  per tablet Take 1 tablet by mouth as needed for moderate pain.     . hydrOXYzine (ATARAX/VISTARIL) 25 MG tablet Take 25 mg by mouth every 8 (eight) hours as needed. For itching.    . loratadine (CLARITIN) 10 MG tablet Take 10 mg by mouth daily.     Marland Kitchen LORazepam (ATIVAN) 0.5 MG tablet Take 0.5 mg by mouth as needed for anxiety.     . metFORMIN (GLUCOPHAGE) 500 MG tablet 500 mg.     . Multiple Vitamin (MULITIVITAMIN WITH MINERALS) TABS Take 1 tablet by mouth daily.    . mupirocin cream (BACTROBAN) 2 % Apply 1 application topically 2 (two) times daily. For 7-10 days 15 g 0  . mupirocin ointment (BACTROBAN) 2 % Apply 1 application topically daily. And cover 22 g 0  . naproxen (NAPROSYN) 500 MG tablet Take 500 mg by mouth 2 (two) times daily with a meal.    . NON  FORMULARY at bedtime. CPAP    . omeprazole (PRILOSEC) 20 MG capsule Take 20 mg by mouth 2 (two) times daily.     . ONE TOUCH ULTRA TEST test strip     . ONETOUCH DELICA LANCETS 99991111 MISC     . sulfaSALAzine (AZULFIDINE) 500 MG tablet Take 1,000 mg by mouth 2 (two) times daily.     . vitamin B-12 (CYANOCOBALAMIN) 1000 MCG tablet Take 500 mcg by mouth daily.     . cyclobenzaprine (FLEXERIL) 5 MG tablet TAKE 1 TO 2 TABLETS(5 TO 10 MG) BY MOUTH THREE TIMES DAILY AS NEEDED FOR MUSCLE SPASMS 30 tablet 0  . cyclobenzaprine (FLEXERIL) 5 MG tablet TAKE 1 TO 2 TABLETS(5 TO 10 MG) BY MOUTH THREE TIMES DAILY AS NEEDED FOR MUSCLE SPASMS 30 tablet 2  . valsartan-hydrochlorothiazide (DIOVAN-HCT) 160-12.5 MG per tablet Take 1 tablet by mouth daily.    . furosemide (LASIX) 40 MG tablet TAKE 1 TABLET(40 MG) BY MOUTH DAILY (Patient not taking: Reported on 06/16/2016) 90 tablet 3  . potassium chloride SA (K-DUR,KLOR-CON) 20 MEQ tablet Take 2 tablets (40 mEq total) by mouth 3 (three) times daily. (Patient not taking: Reported on 06/16/2016) 540 tablet 0  . topiramate (TOPAMAX) 100 MG tablet Take 150 mg by mouth at bedtime.      No facility-administered medications prior to visit.      Allergies:   Review of patient's allergies indicates no known allergies.   Social History   Social History  . Marital status: Married    Spouse name: N/A  . Number of children: 5  . Years of education: N/A   Occupational History  . 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 Smoking status: Former Smoker    Quit date: 10/02/1999  . Smokeless tobacco: Never Used  . Alcohol use No  . Drug use: No  . Sexual activity: Yes    Birth control/ protection: None   Other Topics Concern  . None   Social History Narrative  . None     Family History:  The patient's family history includes Breast cancer in her sister; Cancer in her maternal grandmother and paternal grandfather; Coronary artery disease in her  sister; Diabetes in her father, maternal grandmother, mother, paternal grandfather, and sister; Hypertension in her father, maternal grandmother, and mother; Kidney disease in her father.   ROS:   Please see the history of present illness.    ROS All other systems reviewed and are negative.   PHYSICAL EXAM:   VS:  BP 128/72   Pulse 90   Ht 5\' 2"  (1.575 m)   Wt 120.2 kg (265 lb)   BMI 48.47 kg/m    GEN: Well nourished, well developed, in no acute distress  HEENT: normal  Neck: no JVD, carotid bruits, or masses Cardiac: RRR; no murmurs, rubs, or gallops, trivial pedal edema  Respiratory:  clear to auscultation bilaterally, normal work of breathing GI: soft, nontender, nondistended, + BS MS: no deformity or atrophy  Skin: warm and dry, no rash Neuro:  Alert and Oriented x 3, Strength and sensation are intact Psych: euthymic mood, full affect  Wt Readings from Last 3 Encounters:  06/16/16 120.2 kg (265 lb)  12/15/15 126.1 kg (278 lb)  11/30/15 126.9 kg (279 lb 12.8 oz)      Studies/Labs Reviewed:   EKG:  EKG ordered today Shows normal sinus rhythm, normal repolarization pattern, QTC 450 ms  Recent Labs: 06/24/2015: Brain Natriuretic Peptide 102.6 10/22/2015: Hemoglobin 12.0; Magnesium 2.0; Platelets 276; TSH 0.95 11/27/2015: BUN 18; Creat 0.84; Potassium 3.6; Sodium 139   Lipid Panel    Component Value Date/Time   CHOL 143 03/12/2013 0200   TRIG 128 03/12/2013 0200   HDL 42 03/12/2013 0200   CHOLHDL 3.4 03/12/2013 0200   VLDL 26 03/12/2013 0200   LDLCALC 75 03/12/2013 0200     ASSESSMENT:    1. Chronic diastolic heart failure ([place])   2. Essential hypertension   3. Morbid obesity ([place])   4. Obstructive sleep apnea syndrome   5. Anterior chest wall pain      PLAN:  In order of problems listed above:  1. CHF: Weight gain, edema and blood pressure have all reversed after discontinuation of NSAIDs. Hopefully the new biological agents she is taking will help with  her arthritis. She is conscientiously avoiding high sodium foods. Appears clinically euvolemic. NYHA class II functional status. 2. HTN: Good/probably excessive blood pressure control. Will discontinue the hydrochlorothiazide component of her antihypertensive. Continue valsartan 160 mg once daily. He will take a couple of weeks for the full effects of the diuretics to treat. 3. Obesity: She has changed her diet and is paying more attention to weight loss. She is no longer drinking sodas, only water. She has lost 14 pounds in the last 6 months. I still think she may need to consider bariatric surgery for long-term health benefits. 4. OSA: Reports compliance with CPAP 5. Chest pain: Symptoms are musculoskeletal. If the issue returns and needs to be clarified, the best option will be to proceed directly to coronary angiography. Her nuclear stress test images were plagued by breast attenuation/shifting breast artifact.    Medication Adjustments/Labs and Tests Ordered: Current medicines are reviewed at length with the patient today.  Concerns regarding medicines are outlined above.  Medication changes, Labs and Tests ordered today are listed in the Patient Instructions below. Patient Instructions  Dr Sallyanne Kuster has recommended making the following medication changes: 1. STOP Valsartan-HCT 2. START Valsartan 160 mg - take 1 tablet by mouth once daily  Dr Sallyanne Kuster recommends that you schedule a follow-up appointment in 1 year. You will receive a reminder letter in the mail two months in advance. If you don't receive a letter, please call our office to schedule the follow-up appointment.  If you need a refill on your cardiac medications before your next appointment, please call your pharmacy.      Signed, Sanda Klein, MD  06/16/2016 8:55 AM    [facility]  [address], [place], [place]  [address] Phone: [phone]; Fax: [phone]    

## 2016-06-16 NOTE — Patient Instructions (Signed)
Dr Sallyanne Kuster has recommended making the following medication changes: 1. STOP Valsartan-HCT 2. START Valsartan 160 mg - take 1 tablet by mouth once daily  Dr Sallyanne Kuster recommends that you schedule a follow-up appointment in 1 year. You will receive a reminder letter in the mail two months in advance. If you don't receive a letter, please call our office to schedule the follow-up appointment.  If you need a refill on your cardiac medications before your next appointment, please call your pharmacy.

## 2016-07-21 ENCOUNTER — Encounter: Payer: Self-pay | Admitting: Podiatry

## 2016-07-21 ENCOUNTER — Ambulatory Visit (INDEPENDENT_AMBULATORY_CARE_PROVIDER_SITE_OTHER): Payer: BLUE CROSS/BLUE SHIELD

## 2016-07-21 ENCOUNTER — Ambulatory Visit (INDEPENDENT_AMBULATORY_CARE_PROVIDER_SITE_OTHER): Payer: BLUE CROSS/BLUE SHIELD | Admitting: Podiatry

## 2016-07-21 VITALS — BP 112/68 | HR 110 | Resp 16

## 2016-07-21 DIAGNOSIS — D169 Benign neoplasm of bone and articular cartilage, unspecified: Secondary | ICD-10-CM | POA: Diagnosis not present

## 2016-07-21 DIAGNOSIS — M79672 Pain in left foot: Secondary | ICD-10-CM | POA: Diagnosis not present

## 2016-07-21 DIAGNOSIS — M779 Enthesopathy, unspecified: Secondary | ICD-10-CM

## 2016-07-21 MED ORDER — TRIAMCINOLONE ACETONIDE 10 MG/ML IJ SUSP
10.0000 mg | Freq: Once | INTRAMUSCULAR | Status: AC
  Administered 2016-07-21: 10 mg

## 2016-07-21 NOTE — Progress Notes (Signed)
Subjective:     Patient ID: [name], female   DOB: 1961/04/11, 55 y.o.   MRN: [id number]  HPI patient presents with inflammation pain on top of the left foot stating that it's been getting increasingly sore and she's not sure of any injury   Review of Systems     Objective:   Physical Exam Neurovascular status intact with inflammation and pain on top of the left foot with indications of spur formation more proximal to the area with fluid buildup and slight irritation plantar and distal to the area of the inflammation    Assessment:     Possibility for bone spur versus possibility for soft tissue inflammation with nerve compression    Plan:     H&P x-rays reviewed and careful sheath injection administered of the tendon complex 3 mg Kenalog 5 mill grams Xylocaine and advised on heat therapy. If symptoms persist may need to consider exploration surgery  X-rays indicate that there is spurring which is more proximal to the area with possibility for spurring around second metatarsal base which could be contributory to this problem

## 2016-07-25 ENCOUNTER — Ambulatory Visit (INDEPENDENT_AMBULATORY_CARE_PROVIDER_SITE_OTHER): Payer: Self-pay

## 2016-07-25 ENCOUNTER — Encounter (INDEPENDENT_AMBULATORY_CARE_PROVIDER_SITE_OTHER): Payer: Self-pay | Admitting: Orthopedic Surgery

## 2016-07-25 ENCOUNTER — Ambulatory Visit (INDEPENDENT_AMBULATORY_CARE_PROVIDER_SITE_OTHER): Payer: BLUE CROSS/BLUE SHIELD | Admitting: Orthopedic Surgery

## 2016-07-25 VITALS — Ht 62.0 in | Wt 265.0 lb

## 2016-07-25 DIAGNOSIS — M25532 Pain in left wrist: Secondary | ICD-10-CM | POA: Diagnosis not present

## 2016-07-25 NOTE — Progress Notes (Signed)
Office Visit Note   Patient: [name]           Date of Birth: [birth year]           MRN: [id number] Visit Date: 07/25/2016              Requested by: Shon Baton, MD [address] [place], [place] [address] PCP: Precious Reel, MD   Assessment & Plan: Visit Diagnoses:  1. Pain in left wrist     Plan: Recommended ice and Aleve or ibuprofen for pain. Will follow up in the office as needed.  Follow-Up Instructions: Return if symptoms worsen or fail to improve.   Orders:  Orders Placed This Encounter  Procedures  . XR Wrist 2 Views Left   No orders of the defined types were placed in this encounter.     Procedures: No procedures performed   Clinical Data: No additional findings.   Subjective: Chief Complaint  Patient presents with  . Left Wrist - Pain    DOI 07/20/16    DOI 07/20/16 " had a battle with the recycle trash can" that's that she injured her wrist while taking out the trash. Thumb pain and co,plaints of swelling that is resolving since the initial injury. Decreased movement and pain medial side of the wrist.    Can lid fell on left hand hyperextending the thumb. Some swelling. States has been improving. Has history of left wrist fracture. Wants to make sure she hasnt reinjured her wrist. No wrist pain today. No issues with ROM.   Review of Systems  Constitutional: Negative for chills and fever.  Musculoskeletal: Positive for arthralgias and joint swelling.  All other systems reviewed and are negative.    Objective: Vital Signs: Ht 5\' 2"  (1.575 m)   Wt 265 lb (120.2 kg)   BMI 48.47 kg/m   Physical Exam  Constitutional: She is oriented to person, place, and time. She appears well-developed and well-nourished.  Musculoskeletal: She exhibits edema and tenderness. She exhibits no deformity.  Neurological: She is alert and oriented to person, place, and time.  Psychiatric: She has a normal mood and affect.    Left Hand Exam   Tenderness    The patient is experiencing tenderness in the palmer area.   Range of Motion  The patient has normal left wrist ROM.  Other  Erythema: absent Sensation: normal Pulse: present  Comments:  Pain with flexion of the thumb. Mild tenderness along proximal phalanx.      Specialty Comments:  No specialty comments available.  Imaging: Xr Wrist 2 Views Left  Result Date: 07/25/2016 2 views of the left wrist show no acute fracture no bony abnormality.    PMFS History: Patient Active Problem List   Diagnosis Date Noted  . Chronic diastolic heart failure ([place]) 11/29/2015  . Dyspnea 06/03/2015  . Lung nodule seen on imaging study, CTA of chest, 6 mm nodule, followed by Dr. Virgina Jock 03/25/2013  . Family history of coronary artery disease 03/12/2013  . Morbid obesity ([place]) 03/12/2013  . Chest pain with low risk of acute coronary syndrome - most likely musculoskeletal, negative nuc study 03/11/2013  . Full thickness rotator cuff tear 05/15/2012  . Hyperlipidemia   . Degenerative disc disease   . Sleep apnea   . Hypertension   . History of TIA (transient ischemic attack) 9/13   . Partial tear of rotator cuff(726.13)   . Shoulder impingement 04/12/2012   Past Medical History:  Diagnosis Date  . Anemia  takes iron supplement  . Asthma    daily and prn inhalers  . Chronic diastolic CHF (congestive heart failure) ([place])   . Degenerative disc disease   . Diabetes mellitus ([place])   . Essential hypertension   . Family history of early CAD   . Former tobacco use   . GERD (gastroesophageal reflux disease)   . Headache(784.0)    migraine-like  . History of TIA (transient ischemic attack) early 2013   no weakness or deficits  . History of UTI   . Hyperlipidemia   . Morbid obesity ([place])   . Partial tear of rotator cuff(726.13)   . Shoulder impingement 04/2012   left  . Sleep apnea    uses CPAP nightly    Family History  Problem Relation Age of Onset  . Diabetes Mother   .  Hypertension Mother   . Kidney disease Father   . Hypertension Father   . Diabetes Father   . Breast cancer Sister   . Diabetes Sister   . Cancer Maternal Grandmother   . Diabetes Maternal Grandmother   . Hypertension Maternal Grandmother   . Diabetes Paternal Grandfather   . Cancer Paternal Grandfather   . Coronary artery disease Sister     Past Surgical History:  Procedure Laterality Date  . ABDOMINAL HYSTERECTOMY  03/28/2002   complete  . ANTERIOR CERVICAL DECOMP/DISCECTOMY FUSION  02/18/2010   C6-7  . DILATION AND CURETTAGE OF UTERUS     2000  . TOTAL KNEE ARTHROPLASTY  09/07/2009   left  . TOTAL KNEE ARTHROPLASTY  10/13/2008   right  . UMBILICAL HERNIA REPAIR  03/28/2002   Social History   Occupational History  . 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 Smoking status: Former Smoker    Quit date: 10/02/1999  . Smokeless tobacco: Never Used  . Alcohol use No  . Drug use: No  . Sexual activity: Yes    Birth control/ protection: None

## 2016-07-26 DIAGNOSIS — M545 Low back pain, unspecified: Secondary | ICD-10-CM | POA: Insufficient documentation

## 2016-07-26 DIAGNOSIS — Z96653 Presence of artificial knee joint, bilateral: Secondary | ICD-10-CM | POA: Insufficient documentation

## 2016-07-26 DIAGNOSIS — M47819 Spondylosis without myelopathy or radiculopathy, site unspecified: Secondary | ICD-10-CM | POA: Insufficient documentation

## 2016-07-26 DIAGNOSIS — Z79899 Other long term (current) drug therapy: Secondary | ICD-10-CM | POA: Insufficient documentation

## 2016-07-26 DIAGNOSIS — G8929 Other chronic pain: Secondary | ICD-10-CM | POA: Insufficient documentation

## 2016-07-26 NOTE — Progress Notes (Signed)
Office Visit Note  Patient: [name]             Date of Birth: 1961/06/02           MRN: [id number]             PCP: Precious Reel, MD Referring: Shon Baton, MD Visit Date: 07/29/2016 Occupation: Unemployed    Subjective:  Lower back pain   History of Present Illness: [name] is a 55 y.o. female with the spondyloarthropathy and sacroiliitis. She is been on Cosyntex since August 2017. She has noticed some improvement in her lower back pain. She has lower extremity cramps. And she describes paresthesias in her feet at night. She's been having occasional swelling in her ankles. She was also having pain in the top of her left foot for which she went to [facility] . She was given a cortisone injection and was told that she had a spur in her foot. She has some neck stiffness. Her bilateral knees are replaced which cause some discomfort. She is tolerating her medications well. She reports that he recently she's been having few episodes of diarrhea especially in the morning. With no blood or mucus. He has no history of inflammatory bowel disease. She has seen Dr. Yates Decamp the past for gastroesophageal reflux.  Activities of Daily Living:  Patient reports morning stiffness for 30 minutes.   Patient Reports nocturnal pain.  Difficulty dressing/grooming: Denies Difficulty climbing stairs: Reports Difficulty getting out of chair: Denies Difficulty using hands for taps, buttons, cutlery, and/or writing: Denies   Review of Systems  Constitutional: Positive for fatigue. Negative for night sweats, weight gain, weight loss and weakness.  HENT: Negative for mouth sores, trouble swallowing, trouble swallowing, mouth dryness and nose dryness.   Eyes: Negative for pain, redness, visual disturbance and dryness.  Respiratory: Negative for cough, shortness of breath and difficulty breathing.   Cardiovascular: Negative for chest pain, palpitations, hypertension, irregular  heartbeat and swelling in legs/feet.  Gastrointestinal: Positive for diarrhea. Negative for blood in stool and constipation.  Endocrine: Negative for increased urination.  Genitourinary: Negative for vaginal dryness.  Musculoskeletal: Positive for arthralgias, joint pain, morning stiffness and muscle tenderness. Negative for joint swelling, myalgias, muscle weakness and myalgias.  Skin: Negative for color change, rash, hair loss, skin tightness, ulcers and sensitivity to sunlight.  Allergic/Immunologic: Negative for susceptible to infections.  Neurological: Negative for dizziness, memory loss and night sweats.  Hematological: Negative for swollen glands.  Psychiatric/Behavioral: Negative for depressed mood and sleep disturbance. The patient is not nervous/anxious.     PMFS History:  Patient Active Problem List   Diagnosis Date Noted  . Spondyloarthropathy ([place]) 07/26/2016  . High risk medication use 07/26/2016  . Chronic low back pain 07/26/2016  . H/O total knee replacement, bilateral 07/26/2016  . Chronic diastolic heart failure ([place]) 11/29/2015  . Dyspnea 06/03/2015  . Lung nodule seen on imaging study, CTA of chest, 6 mm nodule, followed by Dr. Virgina Jock 03/25/2013  . Family history of coronary artery disease 03/12/2013  . Morbid obesity ([place]) 03/12/2013  . Chest pain with low risk of acute coronary syndrome - most likely musculoskeletal, negative nuc study 03/11/2013  . Full thickness rotator cuff tear 05/15/2012  . Hyperlipidemia   . DJD (degenerative joint disease), cervical   . Sleep apnea   . Hypertension   . History of TIA (transient ischemic attack) 9/13   . Partial tear of rotator cuff(726.13)   . Shoulder impingement 04/12/2012  Past Medical History:  Diagnosis Date  . Anemia    takes iron supplement  . Anxiety   . Asthma    daily and prn inhalers  . Bone spur    Left foot  . Chronic diastolic CHF (congestive heart failure) ([place])   . DDD (degenerative disc  disease), cervical   . Degenerative disc disease   . Diabetes mellitus ([place])   . Essential hypertension   . Family history of early CAD   . Former tobacco use   . GERD (gastroesophageal reflux disease)   . GERD (gastroesophageal reflux disease)   . Headache(784.0)    migraine-like  . History of TIA (transient ischemic attack) early 2013   no weakness or deficits  . History of UTI   . Hyperlipidemia   . Insomnia   . Migraines   . Morbid obesity ([place])   . Partial tear of rotator cuff(726.13)   . Psoriatic arthritis ([place])   . Shoulder impingement 04/2012   left  . Sleep apnea    uses CPAP nightly    Family History  Problem Relation Age of Onset  . Diabetes Mother   . Hypertension Mother   . Kidney disease Father   . Hypertension Father   . Diabetes Father   . Breast cancer Sister   . Diabetes Sister   . Cancer Maternal Grandmother   . Diabetes Maternal Grandmother   . Hypertension Maternal Grandmother   . Diabetes Paternal Grandfather   . Cancer Paternal Grandfather   . Coronary artery disease Sister   . Diabetes Sister    Past Surgical History:  Procedure Laterality Date  . ABDOMINAL HYSTERECTOMY  03/28/2002   complete  . ANTERIOR CERVICAL DECOMP/DISCECTOMY FUSION  02/18/2010   C6-7  . DILATION AND CURETTAGE OF UTERUS     2000  . FOOT SURGERY Right 2015  . ROTATOR CUFF REPAIR Left 2013  . TOTAL KNEE ARTHROPLASTY  09/07/2009   left  . TOTAL KNEE ARTHROPLASTY  10/13/2008   right  . UMBILICAL HERNIA REPAIR  03/28/2002   Social History   Social History Narrative  . No narrative on file     Objective: Vital Signs: BP 125/75 (BP Location: Left Arm, Patient Position: Sitting, Cuff Size: Large)   Pulse 83   Resp 14   Ht _0  (1.575 m)   Wt 266 lb (120.7 kg)   BMI 48.65 kg/m    Physical Exam  Constitutional: She is oriented to person, place, and time. She appears well-developed and well-nourished.  HENT:  Head: Normocephalic and atraumatic.  Eyes:  Conjunctivae and EOM are normal.  Neck: Normal range of motion.  Cardiovascular: Normal rate, regular rhythm, normal heart sounds and intact distal pulses.   Pulmonary/Chest: Effort normal and breath sounds normal.  Abdominal: Soft. Bowel sounds are normal.  Liver and spleen difficult to palpate due to body habitus  Lymphadenopathy:    She has no cervical adenopathy.  Neurological: She is alert and oriented to person, place, and time.  Skin: Skin is warm and dry. Capillary refill takes less than 2 seconds.  Psychiatric: She has a normal mood and affect. Her behavior is normal.  Nursing note and vitals reviewed.    Musculoskeletal Exam: C-spine good range of motion with some stiffness and thoracic spine good range of motion she has mild tenderness over SI joint area. Shoulder joints, elbow joints, wrist joints, MCPs PIPs with good range of motion with no synovitis. Hip joints, knee joints, ankles with good  range of motion. She has bilateral total knee replacement with some warmth in her knees without any effusion.  CDAI Exam: No CDAI exam completed.    Investigation: Findings:  May 2017 CBC showed hemoglobin 10.9, CMP normal, February 2015 TB negative, December 2015 and hepatitis negative, December 2010 chest x-ray and normal, February 2015 SPEP normal immunoglobulins normal, HIV negative, pneumococcal vaccine December 2015, zoster vaccine 2016, verbal report on MRI of SI joints on 04/27/2016 by Dr. Daryel Gerald: Edema and bilateral SI joint, sclerosis of bilateral SI joints left more than right asymmetrical sacroiliitis consistent with ankylosing spondylitis.    Imaging: Xr Wrist 2 Views Left  Result Date: 07/25/2016 2 views of the left wrist show no acute fracture no bony abnormality.   Speciality Comments: No specialty comments available.    Procedures:  No procedures performed Allergies: Patient has no known allergies.   Assessment / Plan: Visit Diagnoses:  Spondyloarthropathy ([facility]) - MRI positive for sacroiliitis, HLA-B27 negative, elevated ESR. She is doing much better since she's been on Cosyntex with decreased pain in her SI joint area and lower back pain has improved as well.  High risk medication use - Cosyntex once a month. And sulfasalazine. We discussed that she may discontinue sulfasalazine now as her symptoms are better controlled. She may discontinue Naprosyn as well. Her last labs from September 2017 were normal except hemoglobin of 10.7. Her next labs are due in December and then they will be checked every 3 months to monitor for drug toxicity  Lumbar, facet joint arthropathy: Causes some discomfort  DDD C-spine: She has some stiffness.  H/O total knee replacement, bilateral: She has mild chronic pain.  Diarrhea: Patient complains of diarrhea for the last few days. Without any blood or mucus. We discussed the rare occurrence of inflammatory bowel disease with Cosyntex. She's seen Dr. Yates Decamp the past have advised her to make an appointment with him as soon as possible.  She has multiple medical problems which are listed as below:  Chronic diastolic heart failure (HCC)  Lung nodule seen on imaging study, CTA of chest, 6 mm nodule, followed by Dr. Virgina Jock  History of TIA (transient ischemic attack) 9/13  Morbid obesity ([place]): Weight loss diet and exercise was discussed at length.  Obstructive sleep apnea syndrome    Orders: Orders Placed This Encounter  Procedures  . CBC with Differential/Platelet  . COMPLETE METABOLIC PANEL WITH GFR  . Quantiferon tb gold assay (blood)   No orders of the defined types were placed in this encounter.   Face-to-face time spent with patient was 30 minutes. 50% of time was spent in counseling and coordination of care.  Follow-Up Instructions: Return in about 3 months (around 10/29/2016) for Spondyloarthropathy.   Bo Merino, MD

## 2016-07-28 ENCOUNTER — Encounter: Payer: Self-pay | Admitting: *Deleted

## 2016-07-29 ENCOUNTER — Encounter: Payer: Self-pay | Admitting: Rheumatology

## 2016-07-29 ENCOUNTER — Ambulatory Visit (INDEPENDENT_AMBULATORY_CARE_PROVIDER_SITE_OTHER): Payer: BLUE CROSS/BLUE SHIELD | Admitting: Rheumatology

## 2016-07-29 VITALS — BP 125/75 | HR 83 | Resp 14 | Ht 62.0 in | Wt 266.0 lb

## 2016-07-29 DIAGNOSIS — Z96653 Presence of artificial knee joint, bilateral: Secondary | ICD-10-CM

## 2016-07-29 DIAGNOSIS — I5032 Chronic diastolic (congestive) heart failure: Secondary | ICD-10-CM

## 2016-07-29 DIAGNOSIS — M47812 Spondylosis without myelopathy or radiculopathy, cervical region: Secondary | ICD-10-CM

## 2016-07-29 DIAGNOSIS — Z8673 Personal history of transient ischemic attack (TIA), and cerebral infarction without residual deficits: Secondary | ICD-10-CM

## 2016-07-29 DIAGNOSIS — G4733 Obstructive sleep apnea (adult) (pediatric): Secondary | ICD-10-CM | POA: Diagnosis not present

## 2016-07-29 DIAGNOSIS — M545 Low back pain: Secondary | ICD-10-CM | POA: Diagnosis not present

## 2016-07-29 DIAGNOSIS — R911 Solitary pulmonary nodule: Secondary | ICD-10-CM

## 2016-07-29 DIAGNOSIS — M4690 Unspecified inflammatory spondylopathy, site unspecified: Secondary | ICD-10-CM

## 2016-07-29 DIAGNOSIS — G8929 Other chronic pain: Secondary | ICD-10-CM | POA: Diagnosis not present

## 2016-07-29 DIAGNOSIS — Z79899 Other long term (current) drug therapy: Secondary | ICD-10-CM | POA: Diagnosis not present

## 2016-07-29 DIAGNOSIS — R197 Diarrhea, unspecified: Secondary | ICD-10-CM | POA: Diagnosis not present

## 2016-07-29 DIAGNOSIS — M5030 Other cervical disc degeneration, unspecified cervical region: Secondary | ICD-10-CM | POA: Diagnosis not present

## 2016-07-29 DIAGNOSIS — M47819 Spondylosis without myelopathy or radiculopathy, site unspecified: Secondary | ICD-10-CM

## 2016-07-29 NOTE — Patient Instructions (Signed)
Standing Labs We placed an order today for your standing lab work.    Please come back and get your standing labs in December and every 3 months. TB gold due in December  We have open lab Monday through Friday from 8:30-11:30 AM and 1-4 PM at the office of Dr. Tresa Moore, PA.   The office is located at [address] [place], [place], [place] [address] No appointment is necessary.   Labs are drawn by [facility].  You may receive a bill from [facility] for your lab work.   Please reschedule appointment with Dr. Ardis Hughs.

## 2016-08-01 ENCOUNTER — Encounter: Payer: Self-pay | Admitting: Physician Assistant

## 2016-08-01 ENCOUNTER — Ambulatory Visit (INDEPENDENT_AMBULATORY_CARE_PROVIDER_SITE_OTHER): Payer: BLUE CROSS/BLUE SHIELD | Admitting: Physician Assistant

## 2016-08-01 ENCOUNTER — Other Ambulatory Visit (INDEPENDENT_AMBULATORY_CARE_PROVIDER_SITE_OTHER): Payer: BLUE CROSS/BLUE SHIELD

## 2016-08-01 VITALS — BP 102/70 | HR 80 | Ht 62.0 in | Wt 264.2 lb

## 2016-08-01 DIAGNOSIS — R197 Diarrhea, unspecified: Secondary | ICD-10-CM

## 2016-08-01 DIAGNOSIS — A09 Infectious gastroenteritis and colitis, unspecified: Secondary | ICD-10-CM

## 2016-08-01 LAB — CBC WITH DIFFERENTIAL/PLATELET
Basophils Absolute: 0 10*3/uL (ref 0.0–0.1)
Basophils Relative: 0.4 % (ref 0.0–3.0)
Eosinophils Absolute: 0.2 10*3/uL (ref 0.0–0.7)
Eosinophils Relative: 2.5 % (ref 0.0–5.0)
HCT: 36 % (ref 36.0–46.0)
Hemoglobin: 11.6 g/dL — ABNORMAL LOW (ref 12.0–15.0)
Lymphocytes Relative: 18.5 % (ref 12.0–46.0)
Lymphs Abs: 1.5 10*3/uL (ref 0.7–4.0)
MCHC: 32.2 g/dL (ref 30.0–36.0)
MCV: 80.7 fl (ref 78.0–100.0)
Monocytes Absolute: 0.6 10*3/uL (ref 0.1–1.0)
Monocytes Relative: 6.6 % (ref 3.0–12.0)
Neutro Abs: 6 10*3/uL (ref 1.4–7.7)
Neutrophils Relative %: 72 % (ref 43.0–77.0)
Platelets: 274 10*3/uL (ref 150.0–400.0)
RBC: 4.46 Mil/uL (ref 3.87–5.11)
RDW: 16.8 % — ABNORMAL HIGH (ref 11.5–15.5)
WBC: 8.4 10*3/uL (ref 4.0–10.5)

## 2016-08-01 NOTE — Progress Notes (Signed)
Subjective:    Patient ID: [name], female    DOB: [birth year], 55 y.o.   MRN: [id number]  HPI [name] is a pleasant 55 year old African-American female, known to Dr. Ardis Hughs who was last seen in the office in March 2017 for GERD. She comes in today with acute complaints of diarrhea over the past 5 days. She states that she was advised by her rheumatologist to come for evaluation because she is on Cosentyx for ankylosing spondylitis and was told the diarrhea may be a side effect. Reviewing her drugs she is also on topiramate which frequently causes diarrhea. She says she had the dose increased from 150 mg per day to 200 mg per day recently and that it is quite helpful for her migraines.   Patient last had colonoscopy in 2013 for screening was found to have one diminutive polyp in an otherwise negative exam. Biopsy of the polyp showed benign polypoid: rectal mucosa. Patient says she is having generally about 4 loose to liquid stools per day over the past 5 days and that most of the bowel movements are occurring in the morning. Not having any abdominal pain or cramping, no associated nausea or vomiting no hematochezia. No fever or chills. She uses Cosentyx monthly and took  her injection earlier this morning.  No other new medications, antibiotics etc. No known infectious exposures.  Review of Systems.Pertinent positive and negative review of systems were noted in the above HPI section.  All other review of systems was otherwise negative.  Outpatient Encounter Prescriptions as of 08/01/2016  Medication Sig  . acidophilus (RISAQUAD) CAPS Take 1 capsule by mouth every morning.   Marland Kitchen amoxicillin (AMOXIL) 500 MG capsule Take 500 mg by mouth as needed. Before dental procedures  . aspirin 81 MG tablet Take 81 mg by mouth daily as needed for pain.  Marland Kitchen atorvastatin (LIPITOR) 20 MG tablet Take 20 mg by mouth every evening.   . baclofen (LIORESAL) 10 MG tablet Take 20 mg by mouth as needed. Two per week   . beclomethasone (QVAR) 80 MCG/ACT inhaler Inhale 2 puffs into the lungs as needed.   . Calcium Carbonate Antacid (TUMS E-X PO) Take by mouth.  . cyclobenzaprine (FLEXERIL) 10 MG tablet Take 10 mg by mouth 2 (two) times daily as needed for muscle spasms.  . diclofenac sodium (VOLTAREN) 1 % GEL Apply topically 4 (four) times daily.  . ferrous sulfate 325 (65 FE) MG tablet Take 325 mg by mouth 4 (four) times a week. Sunday, Tuesday, Thursday, and saturday  . furosemide (LASIX) 80 MG tablet Take 80 mg by mouth daily.  Marland Kitchen HYDROcodone-acetaminophen (NORCO/VICODIN) 5-325 MG per tablet Take 1 tablet by mouth as needed for moderate pain.   . hydrOXYzine (ATARAX/VISTARIL) 25 MG tablet Take 25 mg by mouth every 8 (eight) hours as needed. For itching.  . loratadine (CLARITIN) 10 MG tablet Take 10 mg by mouth daily.   Marland Kitchen LORazepam (ATIVAN) 0.5 MG tablet Take 0.5 mg by mouth as needed for anxiety.   . metFORMIN (GLUCOPHAGE) 500 MG tablet 500 mg.   . mometasone (NASONEX) 50 MCG/ACT nasal spray Place 2 sprays into the nose daily.  . Multiple Vitamin (MULITIVITAMIN WITH MINERALS) TABS Take 1 tablet by mouth daily.  . mupirocin cream (BACTROBAN) 2 % Apply 1 application topically 2 (two) times daily. For 7-10 days  . mupirocin ointment (BACTROBAN) 2 % Apply 1 application topically daily. And cover  . NON FORMULARY at bedtime. CPAP  . Olopatadine HCl (  PATADAY OP) Apply to eye.  Marland Kitchen omeprazole (PRILOSEC) 20 MG capsule Take 20 mg by mouth 2 (two) times daily.   . ONE TOUCH ULTRA TEST test strip   . ONETOUCH DELICA LANCETS 99991111 MISC   . potassium chloride (KLOR-CON) 20 MEQ packet Take 40 mEq by mouth 2 (two) times daily.  . Secukinumab (COSENTYX [place]) Inject into the skin.  . Topiramate ER (QUDEXY XR) 200 MG CS24 Take 200 mg by mouth at bedtime.  . valsartan (DIOVAN) 160 MG tablet Take 1 tablet (160 mg total) by mouth daily.  . vitamin B-12 (CYANOCOBALAMIN) 1000 MCG tablet Take 500 mcg by mouth daily.   Marland Kitchen zolpidem  (AMBIEN) 10 MG tablet Take 10 mg by mouth at bedtime as needed for sleep.  . [DISCONTINUED] naproxen (NAPROSYN) 500 MG tablet Take 500 mg by mouth 2 (two) times daily with a meal.  . [DISCONTINUED] sulfaSALAzine (AZULFIDINE) 500 MG tablet Take 1,000 mg by mouth 2 (two) times daily.   . [DISCONTINUED] traMADol (ULTRAM) 50 MG tablet Take 50 mg by mouth every 6 (six) hours as needed.   No facility-administered encounter medications on file as of 08/01/2016.    No Known Allergies Patient Active Problem List   Diagnosis Date Noted  . Spondyloarthropathy ([place]) 07/26/2016  . High risk medication use 07/26/2016  . Chronic low back pain 07/26/2016  . H/O total knee replacement, bilateral 07/26/2016  . Chronic diastolic heart failure ([place]) 11/29/2015  . Dyspnea 06/03/2015  . Lung nodule seen on imaging study, CTA of chest, 6 mm nodule, followed by Dr. Virgina Jock 03/25/2013  . Family history of coronary artery disease 03/12/2013  . Morbid obesity ([place]) 03/12/2013  . Chest pain with low risk of acute coronary syndrome - most likely musculoskeletal, negative nuc study 03/11/2013  . Full thickness rotator cuff tear 05/15/2012  . Hyperlipidemia   . DJD (degenerative joint disease), cervical   . Sleep apnea   . Hypertension   . History of TIA (transient ischemic attack) 9/13   . Partial tear of rotator cuff(726.13)   . Shoulder impingement 04/12/2012   Social History   Social History  . Marital status: Married    Spouse name: N/A  . Number of children: 5  . Years of education: N/A   Occupational History  . 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 Smoking status: Former Smoker    Packs/day: 0.50    Years: 20.00    Types: Cigarettes    Quit date: 10/02/1999  . Smokeless tobacco: Never Used  . Alcohol use No  . Drug use: No  . Sexual activity: Yes    Birth control/ protection: None   Other Topics Concern  . Not on file   Social History Narrative  . No narrative  on file    [name]'s family history includes Breast cancer in her sister; Cancer in her maternal grandmother and paternal grandfather; Coronary artery disease in her sister; Diabetes in her father, maternal grandmother, mother, paternal grandfather, sister, and sister; Hypertension in her father, maternal grandmother, and mother; Kidney disease in her father.      Objective:    Vitals:   08/01/16 0819  BP: 102/70  Pulse: 80    Physical Exam well-developed obese African-American female in no acute distress, very pleasant blood pressure 102/70 pulse 80, BMI 48.3. HEENT ;nontraumatic normocephalic EOMI PERRLA sclera anicteric, Cardiovascular; regular rate and rhythm with S1-S2 no murmur or gallop, Pulmonary ;clear bilaterally Abdomen; obese soft  nontender nondistended bowel sounds are active there is no palpable mass or hepatosplenomegaly, Rectal ;exam not done, Ext; no clubbing cyanosis or edema skin warm and dry, Neuropsych; mood and affect appropriate       Assessment & Plan:   #1  55 yo With five-day history of acute diarrhea with 3-4 liquid bowel movements per day. She has had no associated fever chills nausea vomiting or bleeding. Patient is on Cosentyx for ankylosing spondylitis, also on topiramate for migraines both of which have associated side effects of diarrhea. Rule out drug induced diarrhea, rule out infectious etiology i.e. Gastroenteritis  #2 GERD stable #3 morbid obesity #4 congestive heart failure #5 adult-onset diabetes mellitus  Plan; check CBC and GI pathogen panel Advised patient she may use Imodium as needed If infectious workup is negative and diarrhea persists, would decrease dose of topiramate first., and observe      [name] Genia Harold PA-C 08/01/2016   Cc: Shon Baton, MD

## 2016-08-01 NOTE — Patient Instructions (Addendum)
Please go to the basement level to have your labs drawn and stool test.  Take Imodium 1-2 as needed for diarrhea.   We will sent copies of this office note to Dr. Virgina Jock and Dr. Estanislado Pandy.  Follow up with Dr. Owens Loffler as needed.

## 2016-08-02 ENCOUNTER — Other Ambulatory Visit: Payer: BLUE CROSS/BLUE SHIELD

## 2016-08-02 DIAGNOSIS — R197 Diarrhea, unspecified: Secondary | ICD-10-CM

## 2016-08-02 NOTE — Progress Notes (Signed)
I agree with the above note, plan 

## 2016-08-05 ENCOUNTER — Telehealth: Payer: Self-pay | Admitting: Gastroenterology

## 2016-08-05 LAB — GASTROINTESTINAL PATHOGEN PANEL PCR
C. difficile Tox A/B, PCR: DETECTED — CR
Campylobacter, PCR: NOT DETECTED
Cryptosporidium, PCR: NOT DETECTED
E coli (ETEC) LT/ST PCR: NOT DETECTED
E coli (STEC) stx1/stx2, PCR: NOT DETECTED
E coli 0157, PCR: NOT DETECTED
Giardia lamblia, PCR: NOT DETECTED
Norovirus, PCR: NOT DETECTED
Rotavirus A, PCR: NOT DETECTED
Salmonella, PCR: NOT DETECTED
Shigella, PCR: NOT DETECTED

## 2016-08-05 NOTE — Telephone Encounter (Signed)
I received a phone call this evening from [facility] lab regarding GI pathogen panel for this patient which is positive for C Diff. I called her to discuss results and treatment recommendations but she did not answer. I will call her again tomorrow

## 2016-08-06 ENCOUNTER — Other Ambulatory Visit: Payer: Self-pay | Admitting: Gastroenterology

## 2016-08-06 DIAGNOSIS — A0472 Enterocolitis due to Clostridium difficile, not specified as recurrent: Secondary | ICD-10-CM

## 2016-08-06 MED ORDER — METRONIDAZOLE 500 MG PO TABS: 500.0000 mg | ORAL_TABLET | Freq: Three times a day (TID) | ORAL | 0 refills | Status: AC

## 2016-08-06 NOTE — Telephone Encounter (Signed)
Called the patient and left a message, still not able to get through to her about test results

## 2016-08-06 NOTE — Telephone Encounter (Signed)
Patient called back. Told her results of c Diff testing. Appears to have mild disease. Will treat with flagyl 500mg  TID x 10 days. If no improvement or any worsening, or intolerance, I asked her to call back. She agreed.   Amy, FYI she was contacted with this result and started on therapy

## 2016-08-08 NOTE — Telephone Encounter (Signed)
Thank you :)

## 2016-08-15 ENCOUNTER — Telehealth: Payer: Self-pay | Admitting: Physician Assistant

## 2016-08-15 ENCOUNTER — Other Ambulatory Visit: Payer: Self-pay

## 2016-08-15 MED ORDER — VANCOMYCIN HCL 250 MG PO CAPS: 250.0000 mg | ORAL_CAPSULE | Freq: Four times a day (QID) | ORAL | 0 refills | Status: AC

## 2016-08-15 MED ORDER — SACCHAROMYCES BOULARDII 250 MG PO CAPS: 250.0000 mg | ORAL_CAPSULE | Freq: Two times a day (BID) | ORAL | 0 refills | Status: AC

## 2016-08-15 NOTE — Telephone Encounter (Signed)
Ok, she mat have a strain of cdiff that is resistant to Flagyl- she needs to start Vancomycin 250 mg po 4 times daily x 14 days, and Florastor one po BID x one months. Please ask her to call towards the end of the course of vancomycin with an update. Push fluids- she can have a note for work for a couple days if exhausted to stay home and rest

## 2016-08-15 NOTE — Telephone Encounter (Signed)
[name] is not feeling better. She had a couple of days she felt she was turning the corner, but now she is having 5 to 6 watery urgent stools daily. Her abdomen is sore. She feels "exhausted". She actually has another day on Flagyl. What do you advise please?

## 2016-08-16 NOTE — Telephone Encounter (Signed)
Medication successfully filled and picked up by the patient

## 2016-08-25 ENCOUNTER — Other Ambulatory Visit: Payer: Self-pay | Admitting: *Deleted

## 2016-08-25 ENCOUNTER — Other Ambulatory Visit: Payer: Self-pay | Admitting: Rheumatology

## 2016-08-25 DIAGNOSIS — Z79899 Other long term (current) drug therapy: Secondary | ICD-10-CM

## 2016-08-25 LAB — COMPLETE METABOLIC PANEL WITH GFR
ALT: 15 U/L (ref 6–29)
AST: 17 U/L (ref 10–35)
Albumin: 4.1 g/dL (ref 3.6–5.1)
Alkaline Phosphatase: 70 U/L (ref 33–130)
BUN: 17 mg/dL (ref 7–25)
CO2: 25 mmol/L (ref 20–31)
Calcium: 9.2 mg/dL (ref 8.6–10.4)
Chloride: 106 mmol/L (ref 98–110)
Creat: 0.83 mg/dL (ref 0.50–1.05)
GFR, Est African American: >89 mL/min (ref >=60)
GFR, Est Non African American: 80 mL/min (ref >=60)
Glucose, Bld: 125 mg/dL — ABNORMAL HIGH (ref 65–99)
Potassium: 4.4 mmol/L (ref 3.5–5.3)
Sodium: 139 mmol/L (ref 135–146)
Total Bilirubin: 0.3 mg/dL (ref 0.2–1.2)
Total Protein: 6.6 g/dL (ref 6.1–8.1)

## 2016-08-25 LAB — CBC WITH DIFFERENTIAL/PLATELET
Basophils Absolute: 0 cells/uL (ref 0–200)
Basophils Relative: 0 %
Eosinophils Absolute: 212 cells/uL (ref 15–500)
Eosinophils Relative: 2 %
HCT: 35.6 % (ref 35.0–45.0)
Hemoglobin: 11.2 g/dL — ABNORMAL LOW (ref 11.7–15.5)
Lymphocytes Relative: 31 %
Lymphs Abs: 3286 cells/uL (ref 850–3900)
MCH: 25.7 pg — ABNORMAL LOW (ref 27.0–33.0)
MCHC: 31.5 g/dL — ABNORMAL LOW (ref 32.0–36.0)
MCV: 81.7 fL (ref 80.0–100.0)
MPV: 9.3 fL (ref 7.5–12.5)
Monocytes Absolute: 636 cells/uL (ref 200–950)
Monocytes Relative: 6 %
Neutro Abs: 6466 cells/uL (ref 1500–7800)
Neutrophils Relative %: 61 %
Platelets: 278 10*3/uL (ref 140–400)
RBC: 4.36 MIL/uL (ref 3.80–5.10)
RDW: 16.7 % — ABNORMAL HIGH (ref 11.0–15.0)
WBC: 10.6 10*3/uL (ref 3.8–10.8)

## 2016-08-25 NOTE — Telephone Encounter (Signed)
30 day supply only?

## 2016-08-25 NOTE — Telephone Encounter (Signed)
Last Visit: 07/29/16 Next visit: 10/31/16 Labs: 05/29/16 C/W previous labs TB Gold: 05/02/16 Neg Patient reminded she is due for labs this month.  Okay to refill Cosentyx?

## 2016-08-26 NOTE — Progress Notes (Signed)
Labs stable

## 2016-09-23 ENCOUNTER — Other Ambulatory Visit: Payer: Self-pay | Admitting: Rheumatology

## 2016-09-23 NOTE — Telephone Encounter (Signed)
ok 

## 2016-09-23 NOTE — Telephone Encounter (Signed)
Last Visit: 07/29/16 Next Visit: 10/31/16 Labs: 08/25/16 Stable  Okay to refill Cosentyx?

## 2016-10-25 NOTE — Progress Notes (Signed)
Office Visit Note  Patient: [name]             Date of Birth: [birth year]           MRN: [id number]             PCP: Precious Reel, MD Referring: Shon Baton, MD Visit Date: 10/31/2016 Occupation: '@GUAROCC' @    Subjective:  Follow-up Spondyloarthropathy and high risk prescription  History of Present Illness: [name] is a 56 y.o. female   Patient started Cosentyx approximately August 2017. She is doing very well with this medication compared to how she was feeling prior to the medication. She rated her discomfort as about 8 on a scale of 0-10 prior to using the medication. Now her pain has gone down as low as a 2 on some days but he can be about a 3 or 4 on other days. She describes the pain as  Spinous in the lumbar area as well as to the left lateral part of her back. I am not certain if all of this pain is coming from the ankylosing spondylitis. I think associated with ankylosing spondylitis is some musculoskeletal pain.  No other complaints at this time except patient has pain to her coccyx area when she is sitting in a chair. I offered her a donut and she is agreeable.  Activities of Daily Living:  Patient reports morning stiffness for 30 minutes.   Patient Denies nocturnal pain.  Difficulty dressing/grooming: Denies Difficulty climbing stairs: Denies Difficulty getting out of chair: Denies Difficulty using hands for taps, buttons, cutlery, and/or writing: Denies   Review of Systems  Constitutional: Negative for fatigue.  HENT: Negative for mouth sores and mouth dryness.   Eyes: Negative for dryness.  Respiratory: Negative for shortness of breath.   Gastrointestinal: Negative for constipation and diarrhea.  Musculoskeletal: Negative for myalgias and myalgias.  Skin: Negative for sensitivity to sunlight.  Psychiatric/Behavioral: Negative for decreased concentration and sleep disturbance.    PMFS History:  Patient Active Problem List   Diagnosis Date Noted  . Spondyloarthropathy ([place]) 07/26/2016  . High risk medication use 07/26/2016  . Chronic low back pain 07/26/2016  . H/O total knee replacement, bilateral 07/26/2016  . Chronic diastolic heart failure ([place]) 11/29/2015  . Dyspnea 06/03/2015  . Lung nodule seen on imaging study, CTA of chest, 6 mm nodule, followed by Dr. Virgina Jock 03/25/2013  . Family history of coronary artery disease 03/12/2013  . Morbid obesity ([place]) 03/12/2013  . Chest pain with low risk of acute coronary syndrome - most likely musculoskeletal, negative nuc study 03/11/2013  . Full thickness rotator cuff tear 05/15/2012  . Hyperlipidemia   . DJD (degenerative joint disease), cervical   . Sleep apnea   . Hypertension   . History of TIA (transient ischemic attack) 9/13   . Partial tear of rotator cuff(726.13)   . Shoulder impingement 04/12/2012    Past Medical History:  Diagnosis Date  . Anemia    takes iron supplement  . Anxiety   . Asthma    daily and prn inhalers  . Bone spur    Left foot  . Chronic diastolic CHF (congestive heart failure) ([place])   . DDD (degenerative disc disease), cervical   . Degenerative disc disease   . Diabetes mellitus ([place])   . Essential hypertension   . Family history of early CAD   . Former tobacco use   . GERD (gastroesophageal reflux disease)   . GERD (gastroesophageal reflux  disease)   . Headache(784.0)    migraine-like  . History of TIA (transient ischemic attack) early 2013   no weakness or deficits  . History of UTI   . Hyperlipidemia   . Insomnia   . Migraines   . Morbid obesity ([place])   . Partial tear of rotator cuff(726.13)   . Psoriatic arthritis ([place])   . Shoulder impingement 04/2012   left  . Sleep apnea    uses CPAP nightly    Family History  Problem Relation Age of Onset  . Diabetes Mother   . Hypertension Mother   . Kidney disease Father   . Hypertension Father   . Diabetes Father   . Breast cancer Sister   . Diabetes Sister   .  Cancer Maternal Grandmother   . Diabetes Maternal Grandmother   . Hypertension Maternal Grandmother   . Diabetes Paternal Grandfather   . Cancer Paternal Grandfather   . Coronary artery disease Sister   . Diabetes Sister    Past Surgical History:  Procedure Laterality Date  . ABDOMINAL HYSTERECTOMY  03/28/2002   complete  . ANTERIOR CERVICAL DECOMP/DISCECTOMY FUSION  02/18/2010   C6-7  . DILATION AND CURETTAGE OF UTERUS     2000  . FOOT SURGERY Right 2015  . ROTATOR CUFF REPAIR Left 2013  . TOTAL KNEE ARTHROPLASTY  09/07/2009   left  . TOTAL KNEE ARTHROPLASTY  10/13/2008   right  . UMBILICAL HERNIA REPAIR  03/28/2002   Social History   Social History Narrative  . No narrative on file     Objective: Vital Signs: BP 130/82   Pulse 68   Resp 16   Ht '5\' 2"'  (1.575 m)   Wt 264 lb (119.7 kg)   BMI 48.29 kg/m    Physical Exam  Constitutional: She is oriented to person, place, and time. She appears well-developed and well-nourished.  HENT:  Head: Normocephalic and atraumatic.  Eyes: EOM are normal. Pupils are equal, round, and reactive to light.  Cardiovascular: Normal rate, regular rhythm and normal heart sounds.  Exam reveals no gallop and no friction rub.   No murmur heard. Pulmonary/Chest: Effort normal and breath sounds normal. She has no wheezes. She has no rales.  Abdominal: Soft. Bowel sounds are normal. She exhibits no distension. There is no tenderness. There is no guarding. No hernia.  Musculoskeletal: Normal range of motion. She exhibits no edema, tenderness or deformity.  Lymphadenopathy:    She has no cervical adenopathy.  Neurological: She is alert and oriented to person, place, and time. Coordination normal.  Skin: Skin is warm and dry. Capillary refill takes less than 2 seconds. No rash noted.  Psychiatric: She has a normal mood and affect. Her behavior is normal.  Nursing note and vitals reviewed.    Musculoskeletal Exam:  Full range of motion of all  joints Grip strength is equal and strong bilaterally Fiber myalgia tender points are absent  CDAI Exam: CDAI Homunculus Exam:   Joint Counts:  CDAI Tender Joint count: 0 CDAI Swollen Joint count: 0  Global Assessments:  Patient Global Assessment: 3 Provider Global Assessment: 3  CDAI Calculated Score: 6  No synovitis on examination  Investigation: Findings:   Labs from Jan 11, 2016, show CMP with GFR normal. CBC with diff is normal except for low hemoglobin at 10.9, decreased hematocrit at 34.8.   December 2015:  Comprehensive metabolic panel showed potassium 3.1.  CBC showed hemoglobin 11.5.  Sed rate 34.  CK, TSH,  hepatitis panel, G6PD, HIV, immunoglobulins, SPEP, TB Gold, rheumatoid factor, CCP, ANA, HLA-B27 were all within normal limits.  Her UA showed positive bacteria.  She states it was treated by Dr. Virgina Jock.        Orders Only on 08/25/2016  Component Date Value Ref Range Status  . WBC 08/25/2016 10.6  3.8 - 10.8 K/uL Final  . RBC 08/25/2016 4.36  3.80 - 5.10 MIL/uL Final  . Hemoglobin 08/25/2016 11.2* 11.7 - 15.5 g/dL Final  . HCT 08/25/2016 35.6  35.0 - 45.0 % Final  . MCV 08/25/2016 81.7  80.0 - 100.0 fL Final  . MCH 08/25/2016 25.7* 27.0 - 33.0 pg Final  . MCHC 08/25/2016 31.5* 32.0 - 36.0 g/dL Final  . RDW 08/25/2016 16.7* 11.0 - 15.0 % Final  . Platelets 08/25/2016 278  140 - 400 K/uL Final  . MPV 08/25/2016 9.3  7.5 - 12.5 fL Final  . Neutro Abs 08/25/2016 6466  1,500 - 7,800 cells/uL Final  . Lymphs Abs 08/25/2016 3286  850 - 3,900 cells/uL Final  . Monocytes Absolute 08/25/2016 636  200 - 950 cells/uL Final  . Eosinophils Absolute 08/25/2016 212  15 - 500 cells/uL Final  . Basophils Absolute 08/25/2016 0  0 - 200 cells/uL Final  . Neutrophils Relative % 08/25/2016 61  % Final  . Lymphocytes Relative 08/25/2016 31  % Final  . Monocytes Relative 08/25/2016 6  % Final  . Eosinophils Relative 08/25/2016 2  % Final  . Basophils Relative 08/25/2016 0  % Final    . Smear Review 08/25/2016 Criteria for review not met   Final  . Sodium 08/25/2016 139  135 - 146 mmol/L Final  . Potassium 08/25/2016 4.4  3.5 - 5.3 mmol/L Final  . Chloride 08/25/2016 106  98 - 110 mmol/L Final  . CO2 08/25/2016 25  20 - 31 mmol/L Final  . Glucose, Bld 08/25/2016 125* 65 - 99 mg/dL Final  . BUN 08/25/2016 17  7 - 25 mg/dL Final  . Creat 08/25/2016 0.83  0.50 - 1.05 mg/dL Final   Comment:   For patients > or = 56 years of age: The upper reference limit for Creatinine is approximately 13% higher for people identified as African-American.     . Total Bilirubin 08/25/2016 0.3  0.2 - 1.2 mg/dL Final  . Alkaline Phosphatase 08/25/2016 70  33 - 130 U/L Final  . AST 08/25/2016 17  10 - 35 U/L Final  . ALT 08/25/2016 15  6 - 29 U/L Final  . Total Protein 08/25/2016 6.6  6.1 - 8.1 g/dL Final  . Albumin 08/25/2016 4.1  3.6 - 5.1 g/dL Final  . Calcium 08/25/2016 9.2  8.6 - 10.4 mg/dL Final  . GFR, Est African American 08/25/2016 >89  >=60 mL/min Final  . GFR, Est Non African American 08/25/2016 80  >=60 mL/min Final  Appointment on 08/02/2016  Component Date Value Ref Range Status  . Campylobacter, PCR 08/02/2016 Not Detected   Final  . C. difficile Tox A/B, PCR 08/02/2016 Detected*  Final  . E coli 0157, PCR 08/02/2016 Not Detected   Final  . E coli (ETEC) LT/ST PCR 08/02/2016 Not Detected   Final  . E coli (STEC) stx1/stx2, PCR 08/02/2016 Not Detected   Final  . Salmonella, PCR 08/02/2016 Not Detected   Final  . Shigella, PCR 08/02/2016 Not Detected   Final  . Norovirus, PCR 08/02/2016 Not Detected   Final  . Rotavirus A, PCR 08/02/2016 Not Detected  Final  . Giardia lamblia, PCR 08/02/2016 Not Detected   Final  . Cryptosporidium, PCR 08/02/2016 Not Detected   Final   Comment:     ** Normal Reference Range for each Analyte: Not Detected **         ** The xTAG Gastrointestinal Pathogen Panel results are presumptive and must be confirmed by FDA-cleared tests or other  acceptable reference methods.  The results of this test should not be used as the sole basis for diagnosis, treatment, or other patient management decisions.   Performed using the Luminex xTAG Gastrointestinal Pathogen Panel test kit.   Appointment on 08/01/2016  Component Date Value Ref Range Status  . WBC 08/01/2016 8.4  4.0 - 10.5 K/uL Final  . RBC 08/01/2016 4.46  3.87 - 5.11 Mil/uL Final  . Hemoglobin 08/01/2016 11.6* 12.0 - 15.0 g/dL Final  . HCT 08/01/2016 36.0  36.0 - 46.0 % Final  . MCV 08/01/2016 80.7  78.0 - 100.0 fl Final  . MCHC 08/01/2016 32.2  30.0 - 36.0 g/dL Final  . RDW 08/01/2016 16.8* 11.5 - 15.5 % Final  . Platelets 08/01/2016 274.0  150.0 - 400.0 K/uL Final  . Neutrophils Relative % 08/01/2016 72.0  43.0 - 77.0 % Final  . Lymphocytes Relative 08/01/2016 18.5  12.0 - 46.0 % Final  . Monocytes Relative 08/01/2016 6.6  3.0 - 12.0 % Final  . Eosinophils Relative 08/01/2016 2.5  0.0 - 5.0 % Final  . Basophils Relative 08/01/2016 0.4  0.0 - 3.0 % Final  . Neutro Abs 08/01/2016 6.0  1.4 - 7.7 K/uL Final  . Lymphs Abs 08/01/2016 1.5  0.7 - 4.0 K/uL Final  . Monocytes Absolute 08/01/2016 0.6  0.1 - 1.0 K/uL Final  . Eosinophils Absolute 08/01/2016 0.2  0.0 - 0.7 K/uL Final  . Basophils Absolute 08/01/2016 0.0  0.0 - 0.1 K/uL Final    Imaging: No results found.  Speciality Comments: No specialty comments available.    Procedures:  No procedures performed Allergies: Patient has no known allergies.   Assessment / Plan:     Visit Diagnoses: Spondyloarthropathy ([place])  High risk medication use - Plan: CBC with Differential/Platelet, COMPLETE METABOLIC PANEL WITH GFR  DJD (degenerative joint disease), cervical  History of TIA (transient ischemic attack) 9/13  H/O total knee replacement, bilateral  Family history of coronary artery disease  Chronic diastolic heart failure ([facility])  Essential hypertension  Morbid obesity ([facility])  Chest pain with low risk of  acute coronary syndrome - most likely musculoskeletal, negative nuc study  Chronic midline low back pain without sciatica   Plan: #1: Spondyloarthropathy. Patient is doing really well with her back. Her pain was 8 on a scale of 0-10 in the past before she started Cosentyx in August 2017. Her pain now is rated about 2 on a scale of 0-10 on a good day but lately she calls it about a 3-4 on a bad day when she is washing dishes and having strained her back.  #2: High risk prescription. Cosentyx 300 mg once a month. Patient's labs are up-to-date and normal as of 09/27/2016 done at Dr. Keane Police office.  #3: Low back pain. Patient has done physical therapy for her back in the past. I've asked her to maintain her improvement from the physical therapy D by doing them regularly. She is agreeable. In addition I've asked her to do water aerobics. I did tell her that at times knee pain and hip pain and cause low back pain.  It is important to supper right low back pain from non-spondyloarthropathy causes that she is agreeable.  #4: CBC with differential, CMP with GFR every 2 months starting April 2018 Placed order.  #5: Return to clinic in 4 months. If patient continues to do well, and we will bring her back every 5 months  #6: Patient is planning to apply for disability. She will coordinate with her lawyer. She states that she dismiss her first lawyer and she has obtain a second Chief Executive Officer. She knows that we do not do functional capacity evaluation but she can request her office visit notes.  #6: Since patient is having pain when she sits down, I have offered her a prescription for doughnut. She will investigate the cost and comfort from [employer] and [employer] supply of this item.   Orders: Orders Placed This Encounter  Procedures  . CBC with Differential/Platelet  . COMPLETE METABOLIC PANEL WITH GFR   No orders of the defined types were placed in this encounter.   Face-to-face time  spent with patient was 30 minutes. 50% of time was spent in counseling and coordination of care.  Follow-Up Instructions: Return in about 4 months (around 02/28/2017) for SpA;Cosentyx;lbp;obesity;.   Eliezer Lofts, PA-C  Note - This record has been created using [employer].  Chart creation errors have been sought, but may not always  have been located. Such creation errors do not reflect on  the standard of medical care.

## 2016-10-31 ENCOUNTER — Encounter: Payer: Self-pay | Admitting: Rheumatology

## 2016-10-31 ENCOUNTER — Ambulatory Visit (INDEPENDENT_AMBULATORY_CARE_PROVIDER_SITE_OTHER): Payer: BLUE CROSS/BLUE SHIELD | Admitting: Rheumatology

## 2016-10-31 VITALS — BP 130/82 | HR 68 | Resp 16 | Ht 62.0 in | Wt 264.0 lb

## 2016-10-31 DIAGNOSIS — M5030 Other cervical disc degeneration, unspecified cervical region: Secondary | ICD-10-CM | POA: Diagnosis not present

## 2016-10-31 DIAGNOSIS — Z79899 Other long term (current) drug therapy: Secondary | ICD-10-CM | POA: Diagnosis not present

## 2016-10-31 DIAGNOSIS — I10 Essential (primary) hypertension: Secondary | ICD-10-CM

## 2016-10-31 DIAGNOSIS — I5032 Chronic diastolic (congestive) heart failure: Secondary | ICD-10-CM | POA: Diagnosis not present

## 2016-10-31 DIAGNOSIS — M545 Low back pain, unspecified: Secondary | ICD-10-CM

## 2016-10-31 DIAGNOSIS — G8929 Other chronic pain: Secondary | ICD-10-CM

## 2016-10-31 DIAGNOSIS — Z8673 Personal history of transient ischemic attack (TIA), and cerebral infarction without residual deficits: Secondary | ICD-10-CM | POA: Diagnosis not present

## 2016-10-31 DIAGNOSIS — Z96653 Presence of artificial knee joint, bilateral: Secondary | ICD-10-CM | POA: Diagnosis not present

## 2016-10-31 DIAGNOSIS — M47819 Spondylosis without myelopathy or radiculopathy, site unspecified: Secondary | ICD-10-CM

## 2016-10-31 DIAGNOSIS — M4690 Unspecified inflammatory spondylopathy, site unspecified: Secondary | ICD-10-CM

## 2016-10-31 DIAGNOSIS — R079 Chest pain, unspecified: Secondary | ICD-10-CM

## 2016-10-31 DIAGNOSIS — M47812 Spondylosis without myelopathy or radiculopathy, cervical region: Secondary | ICD-10-CM

## 2016-10-31 DIAGNOSIS — Z8249 Family history of ischemic heart disease and other diseases of the circulatory system: Secondary | ICD-10-CM

## 2016-12-08 ENCOUNTER — Encounter (INDEPENDENT_AMBULATORY_CARE_PROVIDER_SITE_OTHER): Payer: Self-pay | Admitting: Orthopedic Surgery

## 2016-12-08 ENCOUNTER — Ambulatory Visit (INDEPENDENT_AMBULATORY_CARE_PROVIDER_SITE_OTHER): Payer: BLUE CROSS/BLUE SHIELD | Admitting: Orthopedic Surgery

## 2016-12-08 VITALS — Ht 62.0 in | Wt 264.0 lb

## 2016-12-08 DIAGNOSIS — M7661 Achilles tendinitis, right leg: Secondary | ICD-10-CM

## 2016-12-08 NOTE — Progress Notes (Signed)
Office Visit Note   Patient: [name]           Date of Birth: 04-08-1961           MRN: [id number] Visit Date: 12/08/2016              Requested by: Shon Baton, MD [address] [place], [place] [address] PCP: Precious Reel, MD  Chief Complaint  Patient presents with  . Right Foot - Pain      HPI: Patient is a 56 year old woman who presents today for evaluation of right heel pain. This is been ongoing for about 3 days. Just pain with weightbearing points to the distal Achilles is the most tender area. There was no acute injury. She has tried ice bandages rest as well as 3 different topical creams including icy hot. Complaining of pain when lifting the foot. denies any swelling.  Assessment & Plan: Visit Diagnoses:  1. Achilles tendinitis, right leg     Plan: She'll follow-up in office in 4 more weeks. Have given her a heel lift today to put in her regular shoes prescription for nitroglycerin patches as well as naproxen.  Follow-Up Instructions: Return in about 4 weeks (around 01/05/2017).   Right Ankle Exam   Tenderness  Right ankle tenderness location: Distal Achilles.    Range of Motion  The patient has normal right ankle ROM.  Muscle Strength  The patient has normal right ankle strength.  Comments:  No defect and Achilles dorsiflexion is intact.      Patient is alert, oriented, no adenopathy, well-dressed, normal affect, normal respiratory effort.   Imaging: No results found.  Labs: Lab Results  Component Value Date   ESRSEDRATE 112 (H) 09/13/2009   REPTSTATUS 09/08/2009 FINAL 09/07/2009   CULT NO GROWTH 09/07/2009    Orders:  No orders of the defined types were placed in this encounter.  No orders of the defined types were placed in this encounter.    Procedures: No procedures performed  Clinical Data: No additional findings.  ROS:  All other systems negative, except as noted in the HPI. Review of Systems  Constitutional:  Negative for chills and fever.  Musculoskeletal: Positive for gait problem and myalgias.    Objective: Vital Signs: Ht 5\' 2"  (1.575 m)   Wt 264 lb (119.7 kg)   BMI 48.29 kg/m   Specialty Comments:  No specialty comments available.  PMFS History: Patient Active Problem List   Diagnosis Date Noted  . Spondyloarthropathy ([place]) 07/26/2016  . High risk medication use 07/26/2016  . Chronic low back pain 07/26/2016  . H/O total knee replacement, bilateral 07/26/2016  . Chronic diastolic heart failure ([place]) 11/29/2015  . Dyspnea 06/03/2015  . Lung nodule seen on imaging study, CTA of chest, 6 mm nodule, followed by Dr. Virgina Jock 03/25/2013  . Family history of coronary artery disease 03/12/2013  . Morbid obesity ([place]) 03/12/2013  . Chest pain with low risk of acute coronary syndrome - most likely musculoskeletal, negative nuc study 03/11/2013  . Full thickness rotator cuff tear 05/15/2012  . Hyperlipidemia   . DJD (degenerative joint disease), cervical   . Sleep apnea   . Hypertension   . History of TIA (transient ischemic attack) 9/13   . Partial tear of rotator cuff(726.13)   . Shoulder impingement 04/12/2012   Past Medical History:  Diagnosis Date  . Anemia    takes iron supplement  . Anxiety   . Asthma    daily and prn inhalers  .  Bone spur    Left foot  . Chronic diastolic CHF (congestive heart failure) ([place])   . DDD (degenerative disc disease), cervical   . Degenerative disc disease   . Diabetes mellitus ([place])   . Essential hypertension   . Family history of early CAD   . Former tobacco use   . GERD (gastroesophageal reflux disease)   . GERD (gastroesophageal reflux disease)   . Headache(784.0)    migraine-like  . History of TIA (transient ischemic attack) early 2013   no weakness or deficits  . History of UTI   . Hyperlipidemia   . Insomnia   . Migraines   . Morbid obesity ([place])   . Partial tear of rotator cuff(726.13)   . Psoriatic arthritis ([place])   .  Shoulder impingement 04/2012   left  . Sleep apnea    uses CPAP nightly    Family History  Problem Relation Age of Onset  . Diabetes Mother   . Hypertension Mother   . Kidney disease Father   . Hypertension Father   . Diabetes Father   . Breast cancer Sister   . Diabetes Sister   . Cancer Maternal Grandmother   . Diabetes Maternal Grandmother   . Hypertension Maternal Grandmother   . Diabetes Paternal Grandfather   . Cancer Paternal Grandfather   . Coronary artery disease Sister   . Diabetes Sister     Past Surgical History:  Procedure Laterality Date  . ABDOMINAL HYSTERECTOMY  03/28/2002   complete  . ANTERIOR CERVICAL DECOMP/DISCECTOMY FUSION  02/18/2010   C6-7  . DILATION AND CURETTAGE OF UTERUS     2000  . FOOT SURGERY Right 2015  . ROTATOR CUFF REPAIR Left 2013  . TOTAL KNEE ARTHROPLASTY  09/07/2009   left  . TOTAL KNEE ARTHROPLASTY  10/13/2008   right  . UMBILICAL HERNIA REPAIR  03/28/2002   Social History   Occupational History  . 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 Smoking status: Former Smoker    Packs/day: 0.50    Years: 20.00    Types: Cigarettes    Quit date: 10/02/1999  . Smokeless tobacco: Never Used  . Alcohol use No  . Drug use: No  . Sexual activity: Yes    Birth control/ protection: None

## 2016-12-13 ENCOUNTER — Other Ambulatory Visit: Payer: Self-pay | Admitting: *Deleted

## 2016-12-13 ENCOUNTER — Telehealth (INDEPENDENT_AMBULATORY_CARE_PROVIDER_SITE_OTHER): Payer: Self-pay | Admitting: Orthopedic Surgery

## 2016-12-13 ENCOUNTER — Other Ambulatory Visit (INDEPENDENT_AMBULATORY_CARE_PROVIDER_SITE_OTHER): Payer: Self-pay

## 2016-12-13 DIAGNOSIS — Z79899 Other long term (current) drug therapy: Secondary | ICD-10-CM

## 2016-12-13 LAB — COMPLETE METABOLIC PANEL WITH GFR
ALT: 15 U/L (ref 6–29)
AST: 14 U/L (ref 10–35)
Albumin: 4.1 g/dL (ref 3.6–5.1)
Alkaline Phosphatase: 104 U/L (ref 33–130)
BUN: 16 mg/dL (ref 7–25)
CO2: 24 mmol/L (ref 20–31)
Calcium: 9.4 mg/dL (ref 8.6–10.4)
Chloride: 107 mmol/L (ref 98–110)
Creat: 0.84 mg/dL (ref 0.50–1.05)
GFR, Est African American: >89 mL/min (ref >=60)
GFR, Est Non African American: 78 mL/min (ref >=60)
Glucose, Bld: 85 mg/dL (ref 65–99)
Potassium: 4.5 mmol/L (ref 3.5–5.3)
Sodium: 141 mmol/L (ref 135–146)
Total Bilirubin: 0.3 mg/dL (ref 0.2–1.2)
Total Protein: 7.1 g/dL (ref 6.1–8.1)

## 2016-12-13 LAB — CBC WITH DIFFERENTIAL/PLATELET
Basophils Absolute: 0 cells/uL (ref 0–200)
Basophils Relative: 0 %
Eosinophils Absolute: 231 cells/uL (ref 15–500)
Eosinophils Relative: 3 %
HCT: 35.7 % (ref 35.0–45.0)
Hemoglobin: 11.4 g/dL — ABNORMAL LOW (ref 11.7–15.5)
Lymphocytes Relative: 27 %
Lymphs Abs: 2079 cells/uL (ref 850–3900)
MCH: 25.1 pg — ABNORMAL LOW (ref 27.0–33.0)
MCHC: 31.9 g/dL — ABNORMAL LOW (ref 32.0–36.0)
MCV: 78.6 fL — ABNORMAL LOW (ref 80.0–100.0)
MPV: 8.9 fL (ref 7.5–12.5)
Monocytes Absolute: 539 cells/uL (ref 200–950)
Monocytes Relative: 7 %
Neutro Abs: 4851 cells/uL (ref 1500–7800)
Neutrophils Relative %: 63 %
Platelets: 264 10*3/uL (ref 140–400)
RBC: 4.54 MIL/uL (ref 3.80–5.10)
RDW: 16.8 % — ABNORMAL HIGH (ref 11.0–15.0)
WBC: 7.7 10*3/uL (ref 3.8–10.8)

## 2016-12-13 MED ORDER — NITROGLYCERIN 0.2 MG/HR TD PT24: 0.2000 mg | MEDICATED_PATCH | Freq: Every day | TRANSDERMAL | 12 refills | Status: DC

## 2016-12-13 MED ORDER — NAPROXEN 500 MG PO TABS: 500.0000 mg | ORAL_TABLET | Freq: Two times a day (BID) | ORAL | 0 refills | Status: DC

## 2016-12-13 NOTE — Telephone Encounter (Signed)
Patient called concerning the pain patches and Naproxin that were going to be called in for her. Patient asked if Junie Panning still wanted to have the prescriptions called into the pharmacy. The number to contact patient is [phone]

## 2016-12-13 NOTE — Telephone Encounter (Signed)
Per dictation naproxen and nitro patch were ordered and these were faxed to the pharmacy per [place]. Called pt to advise.

## 2016-12-14 NOTE — Progress Notes (Signed)
Labs are stable.

## 2016-12-15 LAB — QUANTIFERON TB GOLD ASSAY (BLOOD)
Interferon Gamma Release Assay: NEGATIVE
Mitogen-Nil: >10 IU/mL
Quantiferon Nil Value: 0.03 IU/mL
Quantiferon Tb Ag Minus Nil Value: 0.01 IU/mL

## 2016-12-15 NOTE — Progress Notes (Signed)
Labs are stable.

## 2016-12-30 ENCOUNTER — Telehealth: Payer: Self-pay | Admitting: Radiology

## 2016-12-30 NOTE — Telephone Encounter (Signed)
I spoke to patient regarding her cosentyx. She has an upper respiratory illness and will start on an antibiotic. I have instructed her not to use the Cosentyx until she has finished the antibiotic and she is well from her illness. She has voiced understanding  To you FYI

## 2017-01-02 ENCOUNTER — Ambulatory Visit (INDEPENDENT_AMBULATORY_CARE_PROVIDER_SITE_OTHER): Payer: BLUE CROSS/BLUE SHIELD

## 2017-01-02 ENCOUNTER — Encounter (INDEPENDENT_AMBULATORY_CARE_PROVIDER_SITE_OTHER): Payer: Self-pay | Admitting: Orthopedic Surgery

## 2017-01-02 ENCOUNTER — Ambulatory Visit (INDEPENDENT_AMBULATORY_CARE_PROVIDER_SITE_OTHER): Payer: BLUE CROSS/BLUE SHIELD | Admitting: Orthopedic Surgery

## 2017-01-02 ENCOUNTER — Other Ambulatory Visit: Payer: Self-pay | Admitting: Rheumatology

## 2017-01-02 VITALS — Ht 62.0 in | Wt 264.0 lb

## 2017-01-02 DIAGNOSIS — M25561 Pain in right knee: Secondary | ICD-10-CM | POA: Diagnosis not present

## 2017-01-02 DIAGNOSIS — M25562 Pain in left knee: Secondary | ICD-10-CM | POA: Diagnosis not present

## 2017-01-02 NOTE — Telephone Encounter (Signed)
Last visit 10/31/16 03/01/17 /Next visit Labs 12/13/16 including neg TB gold Ok to refill per Dr Estanislado Pandy

## 2017-01-02 NOTE — Progress Notes (Signed)
Office Visit Note   Patient: [name]           Date of Birth: Sep 01, 1961           MRN: [id number] Visit Date: 01/02/2017              Requested by: Shon Baton, MD [address] [place], [place] [address] PCP: Precious Reel, MD  Chief Complaint  Patient presents with  . Right Foot - Follow-up    Achilles tendonitis   . Right Knee - Pain    Pt states that she fell on 12/28/16 injuring bilateral knees. History of bilateral total knee replacements.   . Left Knee - Pain      HPI: Patient is a 56 year old woman BMI greater than 40 recently had a hyperextension to her left knee a flexion injury to her right knee she fell and landed on the right knee about a week ago. Patient complains of pain in both knees. She is status post bilateral total knee arthroplasties.  Assessment & Plan: Visit Diagnoses:  1. Acute pain of right knee   2. Acute pain of left knee     Plan: Patient does have some muscular weakness around both knees. We'll set her up for physical therapy for strengthening coordination and balance. We will follow-up as needed.  Follow-Up Instructions: Return if symptoms worsen or fail to improve.   Ortho Exam  Patient is alert, oriented, no adenopathy, well-dressed, normal affect, normal respiratory effort. Examination patient has an antalgic gait. She has no effusion in either knee. She has full active extension and flexion with both knees no extensor mechanism injury. There is no effusion. She has a little bit of laxity with varus and valgus stress and a little bit of laxity with anterior drawer. The abrasion on the right knee is healing nicely this about 3 cm in diameter that has a scab on itlay myelitis no signs of infection.  Imaging: Xr Knee 1-2 Views Left  Result Date: 01/02/2017 Two-view radiographs of the left knee shows a stable total knee arthroplasty with a congruent joint no complicating features no hardware failure.  Xr Knee 1-2 Views  Right  Result Date: 01/02/2017 Two-view radiographs of the right knee shows a stable total knee arthroplasty. The joint space is congruent.   Labs: Lab Results  Component Value Date   ESRSEDRATE 112 (H) 09/13/2009   REPTSTATUS 09/08/2009 FINAL 09/07/2009   CULT NO GROWTH 09/07/2009    Orders:  Orders Placed This Encounter  Procedures  . XR Knee 1-2 Views Right  . XR Knee 1-2 Views Left   No orders of the defined types were placed in this encounter.    Procedures: No procedures performed  Clinical Data: No additional findings.  ROS:  All other systems negative, except as noted in the HPI. Review of Systems  Objective: Vital Signs: Ht 5\' 2"  (1.575 m)   Wt 264 lb (119.7 kg)   BMI 48.29 kg/m   Specialty Comments:  No specialty comments available.  PMFS History: Patient Active Problem List   Diagnosis Date Noted  . Spondyloarthropathy ([place]) 07/26/2016  . High risk medication use 07/26/2016  . Chronic low back pain 07/26/2016  . H/O total knee replacement, bilateral 07/26/2016  . Chronic diastolic heart failure ([place]) 11/29/2015  . Dyspnea 06/03/2015  . Lung nodule seen on imaging study, CTA of chest, 6 mm nodule, followed by Dr. Virgina Jock 03/25/2013  . Family history of coronary artery disease 03/12/2013  . Morbid  obesity ([place]) 03/12/2013  . Chest pain with low risk of acute coronary syndrome - most likely musculoskeletal, negative nuc study 03/11/2013  . Full thickness rotator cuff tear 05/15/2012  . Hyperlipidemia   . DJD (degenerative joint disease), cervical   . Sleep apnea   . Hypertension   . History of TIA (transient ischemic attack) 9/13   . Partial tear of rotator cuff(726.13)   . Shoulder impingement 04/12/2012   Past Medical History:  Diagnosis Date  . Anemia    takes iron supplement  . Anxiety   . Asthma    daily and prn inhalers  . Bone spur    Left foot  . Chronic diastolic CHF (congestive heart failure) ([place])   . DDD (degenerative disc  disease), cervical   . Degenerative disc disease   . Diabetes mellitus ([place])   . Essential hypertension   . Family history of early CAD   . Former tobacco use   . GERD (gastroesophageal reflux disease)   . GERD (gastroesophageal reflux disease)   . Headache(784.0)    migraine-like  . History of TIA (transient ischemic attack) early 2013   no weakness or deficits  . History of UTI   . Hyperlipidemia   . Insomnia   . Migraines   . Morbid obesity ([place])   . Partial tear of rotator cuff(726.13)   . Psoriatic arthritis ([place])   . Shoulder impingement 04/2012   left  . Sleep apnea    uses CPAP nightly    Family History  Problem Relation Age of Onset  . Diabetes Mother   . Hypertension Mother   . Kidney disease Father   . Hypertension Father   . Diabetes Father   . Breast cancer Sister   . Diabetes Sister   . Cancer Maternal Grandmother   . Diabetes Maternal Grandmother   . Hypertension Maternal Grandmother   . Diabetes Paternal Grandfather   . Cancer Paternal Grandfather   . Coronary artery disease Sister   . Diabetes Sister     Past Surgical History:  Procedure Laterality Date  . ABDOMINAL HYSTERECTOMY  03/28/2002   complete  . ANTERIOR CERVICAL DECOMP/DISCECTOMY FUSION  02/18/2010   C6-7  . DILATION AND CURETTAGE OF UTERUS     2000  . FOOT SURGERY Right 2015  . ROTATOR CUFF REPAIR Left 2013  . TOTAL KNEE ARTHROPLASTY  09/07/2009   left  . TOTAL KNEE ARTHROPLASTY  10/13/2008   right  . UMBILICAL HERNIA REPAIR  03/28/2002   Social History   Occupational History  . 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 Smoking status: Former Smoker    Packs/day: 0.50    Years: 20.00    Types: Cigarettes    Quit date: 10/02/1999  . Smokeless tobacco: Never Used  . Alcohol use No  . Drug use: No  . Sexual activity: Yes    Birth control/ protection: None

## 2017-01-12 ENCOUNTER — Telehealth: Payer: Self-pay | Admitting: Gastroenterology

## 2017-01-12 NOTE — Telephone Encounter (Signed)
Patient was on ATB's last week for a sinus infection. She has developed diarrhea having 4 to 5 urgent stools each day. No bloody diarrhea, fever or nausea and vomiting. She states she feels a lot like she did the last time she had C-diff.  Do you want labs? Please advise.

## 2017-01-13 ENCOUNTER — Other Ambulatory Visit: Payer: Self-pay

## 2017-01-13 MED ORDER — VANCOMYCIN HCL 250 MG PO CAPS: 250.0000 mg | ORAL_CAPSULE | Freq: Four times a day (QID) | ORAL | 0 refills | Status: AC

## 2017-01-13 NOTE — Telephone Encounter (Signed)
Lets start Vancomycin 250 mg po 4 times daily x 14 days, and Florastor one po BID x one months.   She needs to next week to update on her progress.  Thanks

## 2017-01-13 NOTE — Telephone Encounter (Signed)
Discussed with the patient. She agrees to this plan of care. 

## 2017-01-26 ENCOUNTER — Telehealth: Payer: Self-pay | Admitting: Gastroenterology

## 2017-01-26 NOTE — Telephone Encounter (Signed)
Great, thanks

## 2017-01-31 ENCOUNTER — Telehealth: Payer: Self-pay | Admitting: Rheumatology

## 2017-01-31 NOTE — Telephone Encounter (Signed)
Returned call and provided the dx code M54.5.

## 2017-01-31 NOTE — Telephone Encounter (Signed)
Patient at pharmacy now. Pharmacy needs a dx code for [place]. They have tried several with no luck. Patient waiting.

## 2017-02-17 ENCOUNTER — Other Ambulatory Visit: Payer: Self-pay | Admitting: Internal Medicine

## 2017-02-17 DIAGNOSIS — R918 Other nonspecific abnormal finding of lung field: Secondary | ICD-10-CM

## 2017-03-01 ENCOUNTER — Encounter: Payer: Self-pay | Admitting: Rheumatology

## 2017-03-01 ENCOUNTER — Telehealth: Payer: Self-pay | Admitting: *Deleted

## 2017-03-01 ENCOUNTER — Ambulatory Visit (INDEPENDENT_AMBULATORY_CARE_PROVIDER_SITE_OTHER): Payer: BLUE CROSS/BLUE SHIELD | Admitting: Rheumatology

## 2017-03-01 VITALS — BP 144/82 | HR 80 | Resp 14 | Ht 62.0 in | Wt 268.0 lb

## 2017-03-01 DIAGNOSIS — M79642 Pain in left hand: Secondary | ICD-10-CM

## 2017-03-01 DIAGNOSIS — Z79899 Other long term (current) drug therapy: Secondary | ICD-10-CM

## 2017-03-01 DIAGNOSIS — M545 Low back pain: Secondary | ICD-10-CM | POA: Diagnosis not present

## 2017-03-01 DIAGNOSIS — M4690 Unspecified inflammatory spondylopathy, site unspecified: Secondary | ICD-10-CM | POA: Diagnosis not present

## 2017-03-01 DIAGNOSIS — G8929 Other chronic pain: Secondary | ICD-10-CM

## 2017-03-01 DIAGNOSIS — M47819 Spondylosis without myelopathy or radiculopathy, site unspecified: Secondary | ICD-10-CM

## 2017-03-01 LAB — CBC WITH DIFFERENTIAL/PLATELET
Basophils Absolute: 0 cells/uL (ref 0–200)
Basophils Relative: 0 %
Eosinophils Absolute: 316 cells/uL (ref 15–500)
Eosinophils Relative: 4 %
HCT: 37.3 % (ref 35.0–45.0)
Hemoglobin: 11.5 g/dL — ABNORMAL LOW (ref 11.7–15.5)
Lymphocytes Relative: 27 %
Lymphs Abs: 2133 cells/uL (ref 850–3900)
MCH: 24.8 pg — ABNORMAL LOW (ref 27.0–33.0)
MCHC: 30.8 g/dL — ABNORMAL LOW (ref 32.0–36.0)
MCV: 80.6 fL (ref 80.0–100.0)
MPV: 9 fL (ref 7.5–12.5)
Monocytes Absolute: 474 cells/uL (ref 200–950)
Monocytes Relative: 6 %
Neutro Abs: 4977 cells/uL (ref 1500–7800)
Neutrophils Relative %: 63 %
Platelets: 252 10*3/uL (ref 140–400)
RBC: 4.63 MIL/uL (ref 3.80–5.10)
RDW: 17.2 % — ABNORMAL HIGH (ref 11.0–15.0)
WBC: 7.9 10*3/uL (ref 3.8–10.8)

## 2017-03-01 LAB — COMPLETE METABOLIC PANEL WITH GFR
ALT: 18 U/L (ref 6–29)
AST: 16 U/L (ref 10–35)
Albumin: 4.2 g/dL (ref 3.6–5.1)
Alkaline Phosphatase: 122 U/L (ref 33–130)
BUN: 16 mg/dL (ref 7–25)
CO2: 24 mmol/L (ref 20–31)
Calcium: 9.5 mg/dL (ref 8.6–10.4)
Chloride: 107 mmol/L (ref 98–110)
Creat: 0.99 mg/dL (ref 0.50–1.05)
GFR, Est African American: 74 mL/min (ref >=60)
GFR, Est Non African American: 64 mL/min (ref >=60)
Glucose, Bld: 79 mg/dL (ref 65–99)
Potassium: 4.6 mmol/L (ref 3.5–5.3)
Sodium: 140 mmol/L (ref 135–146)
Total Bilirubin: 0.3 mg/dL (ref 0.2–1.2)
Total Protein: 7.2 g/dL (ref 6.1–8.1)

## 2017-03-01 NOTE — Telephone Encounter (Signed)
-----   Message from Eliezer Lofts, [place] sent at 03/01/2017 10:30 AM EDT -----  Please send Dr. Virgina Jock patient's lab from April 2018.  patient is also having labs drawn today an please taken note to send those labs to Dr. Virgina Jock also. Thank you

## 2017-03-01 NOTE — Telephone Encounter (Signed)
Labs sent to PCP. 

## 2017-03-01 NOTE — Progress Notes (Signed)
Office Visit Note  Patient: [name]             Date of Birth: 1961-09-10           MRN: [id number]             PCP: Shon Baton, MD Referring: Shon Baton, MD Visit Date: 03/01/2017 Occupation: _0 @    Subjective:  Medication Management   History of Present Illness: DA MICHELLE is a 56 y.o. female   last seen in our office on 10/31/2016. 4 spondylorthropathy.  patient has startd Cosentyx about August 2017 and is dongvery well with the mediation. She ontinues to describe her pain is a 2 on ascale of 0-1 on most days.  C. Diff treated by dr. Ardis Hughs ( March 2018) and patient had to miss a dose of Cosentyx. She's restarted the Cosenyx. Her ankylosing spondylitis is doing relatively well with Cosentyx.  patient is pleased with the effects of Cosentx.    Activities of Daily Living:  Patient reports morning stiffness for 15 minutes.   Patient Reports nocturnal pain.  Difficulty dressing/grooming: Denies Difficulty climbing stairs: Reports Difficulty getting out of chair: Reports Difficulty using hands for taps, buttons, cutlery, and/or writing: Denies   Review of Systems  Constitutional: Negative for fatigue.  HENT: Negative for mouth sores and mouth dryness.   Eyes: Negative for dryness.  Respiratory: Negative for shortness of breath.   Gastrointestinal: Negative for constipation and diarrhea.  Musculoskeletal: Negative for myalgias and myalgias.  Skin: Negative for sensitivity to sunlight.  Psychiatric/Behavioral: Negative for decreased concentration and sleep disturbance.    PMFS History:  Patient Active Problem List   Diagnosis Date Noted  . Spondyloarthropathy ([place]) 07/26/2016  . High risk medication use 07/26/2016  . Chronic low back pain 07/26/2016  . H/O total knee replacement, bilateral 07/26/2016  . Chronic diastolic heart failure ([place]) 11/29/2015  . Dyspnea 06/03/2015  . Lung nodule seen on imaging study, CTA of chest, 6 mm nodule,  followed by Dr. Virgina Jock 03/25/2013  . Family history of coronary artery disease 03/12/2013  . Morbid obesity ([place]) 03/12/2013  . Chest pain with low risk of acute coronary syndrome - most likely musculoskeletal, negative nuc study 03/11/2013  . Full thickness rotator cuff tear 05/15/2012  . Hyperlipidemia   . DJD (degenerative joint disease), cervical   . Sleep apnea   . Hypertension   . History of TIA (transient ischemic attack) 9/13   . Partial tear of rotator cuff(726.13)   . Shoulder impingement 04/12/2012    Past Medical History:  Diagnosis Date  . Anemia    takes iron supplement  . Anxiety   . Asthma    daily and prn inhalers  . Bone spur    Left foot  . Chronic diastolic CHF (congestive heart failure) ([place])   . DDD (degenerative disc disease), cervical   . Degenerative disc disease   . Diabetes mellitus ([place])   . Essential hypertension   . Family history of early CAD   . Former tobacco use   . GERD (gastroesophageal reflux disease)   . GERD (gastroesophageal reflux disease)   . Headache(784.0)    migraine-like  . History of TIA (transient ischemic attack) early 2013   no weakness or deficits  . History of UTI   . Hyperlipidemia   . Insomnia   . Migraines   . Morbid obesity ([place])   . Partial tear of rotator cuff(726.13)   . Psoriatic arthritis ([place])   .  Shoulder impingement 04/2012   left  . Sleep apnea    uses CPAP nightly    Family History  Problem Relation Age of Onset  . Diabetes Mother   . Hypertension Mother   . Kidney disease Father   . Hypertension Father   . Diabetes Father   . Breast cancer Sister   . Diabetes Sister   . Cancer Maternal Grandmother   . Diabetes Maternal Grandmother   . Hypertension Maternal Grandmother   . Diabetes Paternal Grandfather   . Cancer Paternal Grandfather   . Coronary artery disease Sister   . Diabetes Sister    Past Surgical History:  Procedure Laterality Date  . ABDOMINAL HYSTERECTOMY  03/28/2002   complete    . ANTERIOR CERVICAL DECOMP/DISCECTOMY FUSION  02/18/2010   C6-7  . DILATION AND CURETTAGE OF UTERUS     2000  . FOOT SURGERY Right 2015  . ROTATOR CUFF REPAIR Left 2013  . TOTAL KNEE ARTHROPLASTY  09/07/2009   left  . TOTAL KNEE ARTHROPLASTY  10/13/2008   right  . UMBILICAL HERNIA REPAIR  03/28/2002   Social History   Social History Narrative  . No narrative on file     Objective: Vital Signs: BP (!) 144/82   Pulse 80   Resp 14   Ht _0  (1.575 m)   Wt 268 lb (121.6 kg)   BMI 49.02 kg/m    Physical Exam  Constitutional: She is oriented to person, place, and time. She appears well-developed and well-nourished.  HENT:  Head: Normocephalic and atraumatic.  Eyes: EOM are normal. Pupils are equal, round, and reactive to light.  Cardiovascular: Normal rate, regular rhythm and normal heart sounds.  Exam reveals no gallop and no friction rub.   No murmur heard. Pulmonary/Chest: Effort normal and breath sounds normal. She has no wheezes. She has no rales.  Abdominal: Soft. Bowel sounds are normal. She exhibits no distension. There is no tenderness. There is no guarding. No hernia.  Musculoskeletal: Normal range of motion. She exhibits no edema, tenderness or deformity.  Lymphadenopathy:    She has no cervical adenopathy.  Neurological: She is alert and oriented to person, place, and time. Coordination normal.  Skin: Skin is warm and dry. Capillary refill takes less than 2 seconds. No rash noted.  Psychiatric: She has a normal mood and affect. Her behavior is normal.  Nursing note and vitals reviewed.    Musculoskeletal Exam:   full range of motion of all joints Grip strength is equal and strong bilaterally For a multitude points are all absent  CDAI Exam: No CDAI exam completed.   no synovitis on examination except the left CMC is painful at times.  Investigation: No additional findings.  Orders Only on 12/13/2016  Component Date Value Ref Range Status  . WBC  12/13/2016 7.7  3.8 - 10.8 K/uL Final  . RBC 12/13/2016 4.54  3.80 - 5.10 MIL/uL Final  . Hemoglobin 12/13/2016 11.4* 11.7 - 15.5 g/dL Final  . HCT 12/13/2016 35.7  35.0 - 45.0 % Final  . MCV 12/13/2016 78.6* 80.0 - 100.0 fL Final  . MCH 12/13/2016 25.1* 27.0 - 33.0 pg Final  . MCHC 12/13/2016 31.9* 32.0 - 36.0 g/dL Final  . RDW 12/13/2016 16.8* 11.0 - 15.0 % Final  . Platelets 12/13/2016 264  140 - 400 K/uL Final  . MPV 12/13/2016 8.9  7.5 - 12.5 fL Final  . Neutro Abs 12/13/2016 4851  1,500 - 7,800 cells/uL Final  .  Lymphs Abs 12/13/2016 2079  850 - 3,900 cells/uL Final  . Monocytes Absolute 12/13/2016 539  200 - 950 cells/uL Final  . Eosinophils Absolute 12/13/2016 231  15 - 500 cells/uL Final  . Basophils Absolute 12/13/2016 0  0 - 200 cells/uL Final  . Neutrophils Relative % 12/13/2016 63  % Final  . Lymphocytes Relative 12/13/2016 27  % Final  . Monocytes Relative 12/13/2016 7  % Final  . Eosinophils Relative 12/13/2016 3  % Final  . Basophils Relative 12/13/2016 0  % Final  . Smear Review 12/13/2016 Criteria for review not met   Final  . Sodium 12/13/2016 141  135 - 146 mmol/L Final  . Potassium 12/13/2016 4.5  3.5 - 5.3 mmol/L Final  . Chloride 12/13/2016 107  98 - 110 mmol/L Final  . CO2 12/13/2016 24  20 - 31 mmol/L Final  . Glucose, Bld 12/13/2016 85  65 - 99 mg/dL Final  . BUN 12/13/2016 16  7 - 25 mg/dL Final  . Creat 12/13/2016 0.84  0.50 - 1.05 mg/dL Final   Comment:   For patients > or = 56 years of age: The upper reference limit for Creatinine is approximately 13% higher for people identified as African-American.     . Total Bilirubin 12/13/2016 0.3  0.2 - 1.2 mg/dL Final  . Alkaline Phosphatase 12/13/2016 104  33 - 130 U/L Final  . AST 12/13/2016 14  10 - 35 U/L Final  . ALT 12/13/2016 15  6 - 29 U/L Final  . Total Protein 12/13/2016 7.1  6.1 - 8.1 g/dL Final  . Albumin 12/13/2016 4.1  3.6 - 5.1 g/dL Final  . Calcium 12/13/2016 9.4  8.6 - 10.4 mg/dL Final  .  GFR, Est African American 12/13/2016 >89  >=60 mL/min Final  . GFR, Est Non African American 12/13/2016 78  >=60 mL/min Final  . Interferon Gamma Release Assay 12/13/2016 NEGATIVE  NEGATIVE Final   Negative test result. M. tuberculosis complex infection unlikely.  . Quantiferon Nil Value 12/13/2016 0.03  IU/mL Final  . Mitogen-Nil 12/13/2016 >10.00  IU/mL Final  . Quantiferon Tb Ag Minus Nil Value 12/13/2016 0.01  IU/mL Final   Comment:   The Nil tube value is used to determine if the patient has a preexisting immune response which could cause a false-positive reading on the test. In order for a test to be valid, the Nil tube must have a value of less than or equal to 8.0 IU/mL.   The mitogen control tube is used to assure the patient has a healthy immune status and also serves as a control for correct blood handling and incubation. It is used to detect false-negative readings. The mitogen tube must have a gamma interferon value of greater than or equal to 0.5 IU/mL higher than the value of the Nil tube.   The TB antigen tube is coated with the M. tuberculosis specific antigens. For a test to be considered positive, the TB antigen tube value minus the Nil tube value must be greater than or equal to 0.35 IU/mL.   For additional information, please refer to http://education.quest[facility].com/faq/QFT (This link is being provided for informational/educational purposes only.)      Imaging: No results found.  Speciality Comments: No specialty comments available.    Procedures:  No procedures performed Allergies: Patient has no known allergies.   Assessment / Plan:     Visit Diagnoses: Spondyloarthropathy ([place])  High risk medication use - Plan: CBC with Differential/Platelet, COMPLETE METABOLIC  PANEL WITH GFR  Lumbar, facet joint arthropathy  Morbid obesity ([facility])  Chronic midline low back pain, with sciatica presence unspecified  Left hand pain    plan: #1:  Spondyloarthropathy. doing well. Started Cosentyx in August 2017.  had to hold off on taking Cosentyx due to C. Difficile infectiontreated by her gastroenterologist March 2018.  patient restarted her Cosentyx.  #2: High risk prescription. Cosentyx every 4 weeks.  doing well. Rates her discomfort as 2 on a scale of 0-10. No flare.  TB gold is up-to-date  And negativeas of April 2018.  #3: Status post bilateral total knee replacement years ago.  #4:obesity. We discussed strategies for weight loss. Patient will make efforts.   #5: Low back pain. Ongoing.  ongoing weight loss will continue to help patient minimize stress to her low back.  #6: Return to clinic in 4 months   #7: CBC with differential and CMP with GFR every2 months. She is due for repeatlabs today. TB gold is up-to-date and negative as of April 2018.  Orders: Orders Placed This Encounter  Procedures  . CBC with Differential/Platelet  . COMPLETE METABOLIC PANEL WITH GFR   No orders of the defined types were placed in this encounter.   Face-to-face time spent with patient was 30 minutes. 50% of time was spent in counseling and coordination of care.  Follow-Up Instructions: Return in about 5 months (around 08/01/2017).   Eliezer Lofts, PA-C   I examined and evaluated the patient with Eliezer Lofts PA.Patient is doing quite well on Cosyntex without any active synovitis. The plan of care was discussed as noted above.  Bo Merino, MD  Note - This record has been created using Editor, commissioning.  Chart creation errors have been sought, but may not always  have been located. Such creation errors do not reflect on  the standard of medical care.

## 2017-03-06 ENCOUNTER — Other Ambulatory Visit: Payer: Self-pay | Admitting: Internal Medicine

## 2017-03-06 DIAGNOSIS — Z1231 Encounter for screening mammogram for malignant neoplasm of breast: Secondary | ICD-10-CM

## 2017-03-07 ENCOUNTER — Telehealth: Payer: Self-pay | Admitting: Radiology

## 2017-03-07 NOTE — Telephone Encounter (Signed)
My chart message sent to advise patient of lab results  labs are normal

## 2017-03-16 ENCOUNTER — Other Ambulatory Visit: Payer: Self-pay | Admitting: Rheumatology

## 2017-03-16 NOTE — Telephone Encounter (Signed)
Last Visit: 03/01/17 Next Visit: 08/02/17 Labs: 03/01/17 WNL TB Gold: 12/13/16 Neg  Okay to refill per Dr. Estanislado Pandy

## 2017-04-19 ENCOUNTER — Other Ambulatory Visit: Payer: Self-pay | Admitting: Internal Medicine

## 2017-04-19 ENCOUNTER — Ambulatory Visit
Admission: RE | Admit: 2017-04-19 | Discharge: 2017-04-19 | Disposition: A | Discharge status: Home / Self Care | Payer: BLUE CROSS/BLUE SHIELD | Source: Ambulatory Visit | Attending: Internal Medicine | Admitting: Internal Medicine

## 2017-04-19 DIAGNOSIS — R2232 Localized swelling, mass and lump, left upper limb: Secondary | ICD-10-CM

## 2017-04-19 DIAGNOSIS — Z1231 Encounter for screening mammogram for malignant neoplasm of breast: Secondary | ICD-10-CM

## 2017-04-27 ENCOUNTER — Ambulatory Visit
Admission: RE | Admit: 2017-04-27 | Discharge: 2017-04-27 | Disposition: A | Discharge status: Home / Self Care | Payer: BLUE CROSS/BLUE SHIELD | Source: Ambulatory Visit | Attending: Internal Medicine | Admitting: Internal Medicine

## 2017-04-27 DIAGNOSIS — R918 Other nonspecific abnormal finding of lung field: Secondary | ICD-10-CM

## 2017-04-30 ENCOUNTER — Emergency Department (HOSPITAL_COMMUNITY): Payer: BLUE CROSS/BLUE SHIELD

## 2017-04-30 ENCOUNTER — Emergency Department (HOSPITAL_COMMUNITY)
Admission: EM | Admit: 2017-04-30 | Discharge: 2017-04-30 | Disposition: A | Discharge status: Home / Self Care | Payer: BLUE CROSS/BLUE SHIELD | Attending: Emergency Medicine | Admitting: Emergency Medicine

## 2017-04-30 ENCOUNTER — Encounter (HOSPITAL_COMMUNITY): Payer: Self-pay | Admitting: *Deleted

## 2017-04-30 DIAGNOSIS — Z87891 Personal history of nicotine dependence: Secondary | ICD-10-CM | POA: Diagnosis not present

## 2017-04-30 DIAGNOSIS — E119 Type 2 diabetes mellitus without complications: Secondary | ICD-10-CM | POA: Insufficient documentation

## 2017-04-30 DIAGNOSIS — Z8673 Personal history of transient ischemic attack (TIA), and cerebral infarction without residual deficits: Secondary | ICD-10-CM | POA: Diagnosis not present

## 2017-04-30 DIAGNOSIS — R0989 Other specified symptoms and signs involving the circulatory and respiratory systems: Secondary | ICD-10-CM

## 2017-04-30 DIAGNOSIS — J45909 Unspecified asthma, uncomplicated: Secondary | ICD-10-CM | POA: Insufficient documentation

## 2017-04-30 DIAGNOSIS — I5032 Chronic diastolic (congestive) heart failure: Secondary | ICD-10-CM | POA: Diagnosis not present

## 2017-04-30 DIAGNOSIS — R0789 Other chest pain: Secondary | ICD-10-CM | POA: Insufficient documentation

## 2017-04-30 DIAGNOSIS — Z79899 Other long term (current) drug therapy: Secondary | ICD-10-CM | POA: Insufficient documentation

## 2017-04-30 DIAGNOSIS — Z7984 Long term (current) use of oral hypoglycemic drugs: Secondary | ICD-10-CM | POA: Diagnosis not present

## 2017-04-30 DIAGNOSIS — I110 Hypertensive heart disease with heart failure: Secondary | ICD-10-CM | POA: Diagnosis not present

## 2017-04-30 LAB — CBC
HCT: 37.9 % (ref 36.0–46.0)
Hemoglobin: 12.2 g/dL (ref 12.0–15.0)
MCH: 25.7 pg — ABNORMAL LOW (ref 26.0–34.0)
MCHC: 32.2 g/dL (ref 30.0–36.0)
MCV: 79.8 fL (ref 78.0–100.0)
Platelets: 256 10*3/uL (ref 150–400)
RBC: 4.75 MIL/uL (ref 3.87–5.11)
RDW: 15.8 % — ABNORMAL HIGH (ref 11.5–15.5)
WBC: 8.8 10*3/uL (ref 4.0–10.5)

## 2017-04-30 LAB — BASIC METABOLIC PANEL
Anion gap: 5 (ref 5–15)
BUN: 13 mg/dL (ref 6–20)
CO2: 26 mmol/L (ref 22–32)
Calcium: 9.3 mg/dL (ref 8.9–10.3)
Chloride: 107 mmol/L (ref 101–111)
Creatinine, Ser: 1.01 mg/dL — ABNORMAL HIGH (ref 0.44–1.00)
GFR calc Af Amer: >60 mL/min (ref >60)
GFR calc non Af Amer: >60 mL/min (ref >60)
Glucose, Bld: 133 mg/dL — ABNORMAL HIGH (ref 65–99)
Potassium: 3.8 mmol/L (ref 3.5–5.1)
Sodium: 138 mmol/L (ref 135–145)

## 2017-04-30 NOTE — ED Triage Notes (Signed)
Pt is here due to "heart fluttering" x2 days.  Pt denies any CP and states that she has hx of CHF and reports some increasing sob over the past week with a 7lb weight gain.

## 2017-04-30 NOTE — ED Notes (Addendum)
Istat trop : 0.00, PA aware

## 2017-04-30 NOTE — ED Provider Notes (Signed)
[facility] DEPT Provider Note   CSN: [id number] Arrival date & time: 04/30/17  3329     History   Chief Complaint Chief Complaint  Patient presents with  . Palpitations    HPI [name] is a 56 y.o. female.  HPI   56 year old female presents today with complaints of abnormal sensation in her chest.  Patient notes that for the last several days she has had coming and going vibrating sensation in her left chest.  She notes this is just under her left breast.  She notes this comes on without provocation last several minutes and then resolves completely.  Patient denies any associated palpitations, chest pain, shortness of breath, dizziness, or any other significant complaints.  Patient denies any history of the same.  Patient reports she does has a history of heart failure, currently taking Lasix.  Patient notes over the last week she has been "bee sting on candy bars" at night.  She has noted a 7 pound weight gain since onset of this.  She notes chronic lower extremity edema that is slightly worsened over the last week, and is currently taking 60 mg of Lasix for the next 3 days as instructed by her primary care provider.  Patient reports that she sometimes has minor shortness of breath with ambulation, this is baseline for her.  She denies any orthopnea, cough, fever chills, or any other significant etiology.  Patient denies any history DVT or PE, she denies any significant risk factors.  In addition to the sensation in her left chest patient also reports she has had left eye twitching for the last week and a half as well.  She denies any excessive caffeine use, notes that she has approximately 1 cup of coffee per morning, she denies any drug use.  No increased stress at home.    Past Medical History:  Diagnosis Date  . Anemia    takes iron supplement  . Anxiety   . Asthma    daily and prn inhalers  . Bone spur    Left foot  . Chronic diastolic CHF (congestive heart  failure) ([place])   . DDD (degenerative disc disease), cervical   . Degenerative disc disease   . Diabetes mellitus ([place])   . Essential hypertension   . Family history of early CAD   . Former tobacco use   . GERD (gastroesophageal reflux disease)   . GERD (gastroesophageal reflux disease)   . Headache(784.0)    migraine-like  . History of TIA (transient ischemic attack) early 2013   no weakness or deficits  . History of UTI   . Hyperlipidemia   . Insomnia   . Migraines   . Morbid obesity ([place])   . Partial tear of rotator cuff(726.13)   . Psoriatic arthritis ([place])   . Shoulder impingement 04/2012   left  . Sleep apnea    uses CPAP nightly    Patient Active Problem List   Diagnosis Date Noted  . Spondyloarthropathy ([place]) 07/26/2016  . High risk medication use 07/26/2016  . Chronic low back pain 07/26/2016  . H/O total knee replacement, bilateral 07/26/2016  . Chronic diastolic heart failure ([place]) 11/29/2015  . Dyspnea 06/03/2015  . Lung nodule seen on imaging study, CTA of chest, 6 mm nodule, followed by Dr. Virgina Jock 03/25/2013  . Family history of coronary artery disease 03/12/2013  . Morbid obesity ([place]) 03/12/2013  . Chest pain with low risk of acute coronary syndrome - most likely musculoskeletal, negative nuc study  03/11/2013  . Full thickness rotator cuff tear 05/15/2012  . Hyperlipidemia   . DJD (degenerative joint disease), cervical   . Sleep apnea   . Hypertension   . History of TIA (transient ischemic attack) 9/13   . Partial tear of rotator cuff(726.13)   . Shoulder impingement 04/12/2012    Past Surgical History:  Procedure Laterality Date  . ABDOMINAL HYSTERECTOMY  03/28/2002   complete  . ANTERIOR CERVICAL DECOMP/DISCECTOMY FUSION  02/18/2010   C6-7  . BREAST BIOPSY Right   . DILATION AND CURETTAGE OF UTERUS     2000  . FOOT SURGERY Right 2015  . ROTATOR CUFF REPAIR Left 2013  . TOTAL KNEE ARTHROPLASTY  09/07/2009   left  . TOTAL KNEE ARTHROPLASTY   10/13/2008   right  . UMBILICAL HERNIA REPAIR  03/28/2002    OB History    No data available       Home Medications    Prior to Admission medications   Medication Sig Start Date End Date Taking? Authorizing Provider  acidophilus (RISAQUAD) CAPS Take 1 capsule by mouth every morning.     [provider]  amoxicillin (AMOXIL) 500 MG capsule Take 500 mg by mouth as needed. Before dental procedures    [provider]  aspirin 81 MG tablet Take 81 mg by mouth daily as needed for pain.    [provider]  atorvastatin (LIPITOR) 20 MG tablet Take 20 mg by mouth every evening.     [provider]  baclofen (LIORESAL) 10 MG tablet Take 20 mg by mouth as needed. Two per week    [provider]  beclomethasone (QVAR) 80 MCG/ACT inhaler Inhale 2 puffs into the lungs as needed.     [provider]  Calcium Carbonate Antacid (TUMS E-X PO) Take by mouth.    [provider]  COSENTYX SENSOREADY PEN 150 MG/ML SOAJ INJECT ONE PEN (150 MG) SUBCUTANEOUSLY EVERY 4 WEEKS. REFRIGERATE. ALLOW 15 TO 30 MINUTES AT ROOM TEMP PRIOR TO ADMINISTRATION. 03/16/17   Bo Merino, MD  cyclobenzaprine (FLEXERIL) 10 MG tablet Take 10 mg by mouth 2 (two) times daily as needed for muscle spasms.    [provider]  diclofenac sodium (VOLTAREN) 1 % GEL Apply topically 4 (four) times daily.    [provider]  DULoxetine (CYMBALTA) 60 MG capsule TK 1 C PO D 10/10/16   [provider]  ferrous sulfate 325 (65 FE) MG tablet Take 325 mg by mouth 4 (four) times a week. Sunday, Tuesday, Thursday, and saturday    [provider]  furosemide (LASIX) 40 MG tablet  12/07/16   [provider]  furosemide (LASIX) 80 MG tablet Take 80 mg by mouth daily.    [provider]  HYDROcodone-acetaminophen (NORCO/VICODIN) 5-325 MG per tablet Take 1 tablet by mouth as needed for moderate pain.     [provider]    hydrOXYzine (ATARAX/VISTARIL) 25 MG tablet Take 25 mg by mouth every 8 (eight) hours as needed. For itching.    [provider]  loratadine (CLARITIN) 10 MG tablet Take 10 mg by mouth daily.     [provider]  LORazepam (ATIVAN) 0.5 MG tablet Take 0.5 mg by mouth as needed for anxiety.     [provider]  metFORMIN (GLUCOPHAGE) 500 MG tablet 500 mg.  04/08/14   [provider]  mometasone (NASONEX) 50 MCG/ACT nasal spray Place 2 sprays into the nose daily.    [provider]  Multiple Vitamin (MULITIVITAMIN WITH MINERALS) TABS Take 1 tablet by mouth daily.    [provider]  mupirocin ointment (BACTROBAN) 2 % Apply 1 application topically daily. And cover 06/06/14   Bronson Ing, DPM  naproxen (NAPROSYN) 500 MG tablet Take 1 tablet (500 mg total) by mouth 2 (two) times daily with a meal. Patient not taking: Reported on 03/01/2017 12/13/16   Suzan Slick, NP  nitroGLYCERIN (NITRO-DUR) 0.2 mg/hr patch Place 1 patch (0.2 mg total) onto the skin daily. 12/13/16   Suzan Slick, NP  NON FORMULARY at bedtime. CPAP    [provider]  Olopatadine HCl (PATADAY OP) Apply to eye.    [provider]  omeprazole (PRILOSEC) 20 MG capsule Take 20 mg by mouth 2 (two) times daily.     Denita Lung, MD  ONE [facility] ULTRA TEST test strip  04/29/14   [provider]  ONETOUCH DELICA LANCETS 36U MISC  04/29/14   [provider]  potassium chloride (KLOR-CON) 20 MEQ packet Take 40 mEq by mouth 2 (two) times daily.    [provider]  potassium chloride SA (K-DUR,KLOR-CON) 20 MEQ tablet TK 2 TS PO BID 10/04/16   [provider]  Topiramate ER (QUDEXY XR) 200 MG CS24 Take 200 mg by mouth at bedtime.    [provider]  valsartan (DIOVAN) 160 MG tablet Take 1 tablet (160 mg total) by mouth daily. 06/16/16   Croitoru, Mihai, MD  vitamin B-12 (CYANOCOBALAMIN) 1000 MCG tablet Take 500 mcg by mouth daily.      [provider]  zolpidem (AMBIEN) 10 MG tablet Take 10 mg by mouth at bedtime as needed for sleep.    [provider]    Family History Family History  Problem Relation Age of Onset  . Diabetes Mother   . Hypertension Mother   . Kidney disease Father   . Hypertension Father   . Diabetes Father   . Breast cancer Sister   . Diabetes Sister   . Breast cancer Sister   . Cancer Maternal Grandmother   . Diabetes Maternal Grandmother   . Hypertension Maternal Grandmother   . Diabetes Paternal Grandfather   . Cancer Paternal Grandfather   . Coronary artery disease Sister   . Diabetes Sister     Social History Social History  Substance Use Topics  . Smoking status: Former Smoker    Packs/day: 0.50    Years: 20.00    Types: Cigarettes    Quit date: 10/02/1999  . Smokeless tobacco: Never Used  . Alcohol use No     Allergies   Patient has no known allergies.   Review of Systems Review of Systems  All other systems reviewed and are negative.    Physical Exam Updated Vital Signs BP 123/82   Pulse 84   Temp 98.1 F (36.7 C) (Oral)   Resp (!) 27   Ht 5\' 1"  (1.549 m)   Wt 124.7 kg (275 lb)   SpO2 98%   BMI 51.96 kg/m   Physical Exam  Constitutional: She is oriented to person, place, and time. She appears well-developed and well-nourished.  HENT:  Head: Normocephalic and atraumatic.  Eyes: Pupils are equal, round, and reactive to light. Conjunctivae are normal. Right eye exhibits no discharge. Left eye exhibits no discharge. No scleral icterus.  Neck: Normal range of motion. No JVD present. No tracheal deviation present.  Cardiovascular: Regular rhythm, normal heart sounds and intact  distal pulses.  Exam reveals no gallop and no friction rub.   No murmur heard. Pulmonary/Chest: Effort normal and breath sounds normal. No stridor. No respiratory distress. She has no wheezes. She has no rales. She exhibits no tenderness.  Abdominal: Soft. She  exhibits no distension and no mass. There is no tenderness. There is no rebound and no guarding. No hernia.  Musculoskeletal:  Minor bilateral LEE- symmetrical   Neurological: She is alert and oriented to person, place, and time. Coordination normal.  Skin: Skin is warm.  Psychiatric: She has a normal mood and affect. Her behavior is normal. Judgment and thought content normal.  Nursing note and vitals reviewed.    ED Treatments / Results  Labs (all labs ordered are listed, but only abnormal results are displayed) Labs Reviewed  BASIC METABOLIC PANEL - Abnormal; Notable for the following:       Result Value   Glucose, Bld 133 (*)    Creatinine, Ser 1.01 (*)    All other components within normal limits  CBC - Abnormal; Notable for the following:    MCH 25.7 (*)    RDW 15.8 (*)    All other components within normal limits  I-STAT TROPONIN, ED    EKG  EKG Interpretation  Date/Time:  Sunday April 30 2017 09:01:54 EDT Ventricular Rate:  102 PR Interval:  140 QRS Duration: 92 QT Interval:  354 QTC Calculation: 461 R Axis:   56 Text Interpretation:  Sinus tachycardia Otherwise normal ECG agree. no change from previous Confirmed by Charlesetta Shanks [phone]) on 04/30/2017 11:23:06 AM       Radiology Dg Chest 2 View  Result Date: 04/30/2017 CLINICAL DATA:  Fluittering heart.  History of CHF. EXAM: CHEST  2 VIEW COMPARISON:  CT from 3 days ago FINDINGS: Normal heart size and mediastinal contours. No acute infiltrate or edema. Known right-sided lung nodule is not apparent. No effusion or pneumothorax. ACDF. No acute osseous findings. IMPRESSION: Negative chest. Electronically Signed   By: Monte Fantasia [name]   On: 04/30/2017 09:57    Procedures Procedures (including critical care time)  Medications Ordered in ED Medications - No data to display   Initial Impression / Assessment and Plan / ED Course  I have reviewed the triage vital signs and the nursing notes.  Pertinent  labs & imaging results that were available during my care of the patient were reviewed by me and considered in my medical decision making (see chart for details).     Final Clinical Impressions(s) / ED Diagnoses   Final diagnoses:  Chest wall symptom    Labs: BMP, CBC, Trop  Imaging: DG Chest  Consults:  Therapeutics:  Discharge Meds:   Assessment/Plan: 56 year old female presents today with an abnormal sensation in her left chest.  Her presentation is nonspecific, I have very low suspicion for cardiac etiology, pulmonary, or vascular etiology.  Patient does not feel this is her heart, symptoms have been coming and going, she had symptoms while here in the ED, her heart tracing was reviewed which showed no change in heart rate and rhythm or any other concerning finding.  Patient is a heart failure patient, no significant change in her baseline fluid status, she has clear lung sounds, no shortness of breath, reassuring oxygen saturation.  Her chest x-ray shows no acute findings, she is increasing her Lasix for reported increased edema and weight gain, this is probably secondary to increase and candy.  Patient was monitored here in the ED with  no significant changes, she will be referred to cardiology for follow-up evaluation, she is given strict return precautions, she verbalized understanding and agreement to today's plan had no further questions or concerns at time discharge.      New Prescriptions New Prescriptions   No medications on file     Francee Gentile 04/30/17 [place], MD 05/09/17 [phone]

## 2017-04-30 NOTE — Discharge Instructions (Signed)
Please read attached information. If you experience any new or worsening signs or symptoms please return to the emergency room for evaluation. Please follow-up with your primary care provider or specialist as discussed.  °

## 2017-05-01 LAB — I-STAT TROPONIN, ED: Troponin i, poc: 0 ng/mL (ref 0.00–0.08)

## 2017-05-04 ENCOUNTER — Other Ambulatory Visit: Payer: Self-pay | Admitting: Rheumatology

## 2017-05-26 ENCOUNTER — Other Ambulatory Visit: Payer: Self-pay | Admitting: Rheumatology

## 2017-05-26 NOTE — Telephone Encounter (Signed)
03/01/17 last visit 08/14/17 next visit   CBC Latest Ref Rng & Units 04/30/2017 03/01/2017 12/13/2016  WBC 4.0 - 10.5 K/uL 8.8 7.9 7.7  Hemoglobin 12.0 - 15.0 g/dL 12.2 11.5(L) 11.4(L)  Hematocrit 36.0 - 46.0 % 37.9 37.3 35.7  Platelets 150 - 400 K/uL 256 252 264   CMP Latest Ref Rng & Units 04/30/2017 03/01/2017 12/13/2016  Glucose 65 - 99 mg/dL 133(H) 79 85  BUN 6 - 20 mg/dL 13 16 16   Creatinine 0.44 - 1.00 mg/dL 1.01(H) 0.99 0.84  Sodium 135 - 145 mmol/L 138 140 141  Potassium 3.5 - 5.1 mmol/L 3.8 4.6 4.5  Chloride 101 - 111 mmol/L 107 107 107  CO2 22 - 32 mmol/L 26 24 24   Calcium 8.9 - 10.3 mg/dL 9.3 9.5 9.4  Total Protein 6.1 - 8.1 g/dL - 7.2 7.1  Total Bilirubin 0.2 - 1.2 mg/dL - 0.3 0.3  Alkaline Phos 33 - 130 U/L - 122 104  AST 10 - 35 U/L - 16 14  ALT 6 - 29 U/L - 18 15   12/13/16 negative TB gold   Ok to refill per Dr Estanislado Pandy

## 2017-05-30 ENCOUNTER — Other Ambulatory Visit: Payer: Self-pay

## 2017-05-30 DIAGNOSIS — Z79899 Other long term (current) drug therapy: Secondary | ICD-10-CM

## 2017-05-31 LAB — CBC WITH DIFFERENTIAL/PLATELET
Basophils Absolute: 40 cells/uL (ref 0–200)
Basophils Relative: 0.5 %
Eosinophils Absolute: 280 cells/uL (ref 15–500)
Eosinophils Relative: 3.5 %
HCT: 37.3 % (ref 35.0–45.0)
Hemoglobin: 11.8 g/dL (ref 11.7–15.5)
Lymphs Abs: 2392 cells/uL (ref 850–3900)
MCH: 25 pg — ABNORMAL LOW (ref 27.0–33.0)
MCHC: 31.6 g/dL — ABNORMAL LOW (ref 32.0–36.0)
MCV: 79 fL — ABNORMAL LOW (ref 80.0–100.0)
MPV: 9.2 fL (ref 7.5–12.5)
Monocytes Relative: 8 %
Neutro Abs: 4648 cells/uL (ref 1500–7800)
Neutrophils Relative %: 58.1 %
Platelets: 256 10*3/uL (ref 140–400)
RBC: 4.72 10*6/uL (ref 3.80–5.10)
RDW: 14.9 % (ref 11.0–15.0)
Total Lymphocyte: 29.9 %
WBC mixed population: 640 cells/uL (ref 200–950)
WBC: 8 10*3/uL (ref 3.8–10.8)

## 2017-05-31 LAB — COMPLETE METABOLIC PANEL WITH GFR
AG Ratio: 1.5 (calc) (ref 1.0–2.5)
ALT: 13 U/L (ref 6–29)
AST: 14 U/L (ref 10–35)
Albumin: 4.1 g/dL (ref 3.6–5.1)
Alkaline phosphatase (APISO): 128 U/L (ref 33–130)
BUN: 15 mg/dL (ref 7–25)
CO2: 25 mmol/L (ref 20–32)
Calcium: 9.4 mg/dL (ref 8.6–10.4)
Chloride: 107 mmol/L (ref 98–110)
Creat: 0.91 mg/dL (ref 0.50–1.05)
GFR, Est African American: 82 mL/min/{1.73_m2} (ref >=60)
GFR, Est Non African American: 71 mL/min/{1.73_m2} (ref >=60)
Globulin: 2.8 g/dL (calc) (ref 1.9–3.7)
Glucose, Bld: 107 mg/dL — ABNORMAL HIGH (ref 65–99)
Potassium: 4.6 mmol/L (ref 3.5–5.3)
Sodium: 139 mmol/L (ref 135–146)
Total Bilirubin: 0.3 mg/dL (ref 0.2–1.2)
Total Protein: 6.9 g/dL (ref 6.1–8.1)

## 2017-05-31 NOTE — Progress Notes (Signed)
Labs are stable.

## 2017-06-02 ENCOUNTER — Other Ambulatory Visit: Payer: Self-pay | Admitting: Cardiovascular Disease

## 2017-06-23 ENCOUNTER — Telehealth: Payer: Self-pay | Admitting: Cardiovascular Disease

## 2017-06-23 NOTE — Telephone Encounter (Signed)
Closed Encounter  °

## 2017-06-25 NOTE — Progress Notes (Signed)
Cardiology Office Note    Date:  06/26/2017   ID:  [name], DOB 01/12/1961, MRN [id number]  PCP:  Shon Baton, MD  Cardiologist: Dr. Sallyanne Kuster  Chief Complaint  Patient presents with  . Follow-up    Annual Visit    History of Present Illness:    [name] is a 56 y.o. female with past medical history of chronic diastolic CHF, HTN, HLD, Type 2 DM, OSA (on CPAP), and morbid obesity who presents to the office today for annual follow-up.   She was last examined by Dr. Sallyanne Kuster in 06/2016 and reported doing well from a cardiac perspective, denying any exertional chest pain or dyspnea on exertion. She was taking half of her Valsartan-HCTZ on some days due to soft blood pressure readings. BP was well-controlled at 128/72 during her visit. With her variable BP readings, HCTZ was discontinued and she was placed on Valsartan 160mg  daily.   In the interim, she was evaluated at [facility] ED in 04/2017 for palpitations. CBC and BMET were without acute abnormalities and initial troponin was negative. EKG showed sinus tachycardia, HR 102, with no acute ST or T-wave changes and CXR without acute findings, therefore she was informed to follow-up with Cardiology in the outpatient setting.   In talking with the patient today, she reports doing well since her last office visit. She denies any recent chest pain, dyspnea on exertion, orthopnea, PND, lower extremity edema, or palpitations. She reports that when she was evaluated in the ED in 04/2017 that she was having a sharp discomfort in her chest in which she felt her entire chest was moving with the pain, episodes only lasting for seconds at a time and no associated with exertion. She denies any repeat episodes since. Has been able to walk around her neighborhood daily without any anginal symptoms.   She reports good compliance with CPAP and uses this on a nightly basis. Feels that she is obtaining restorative sleep.   Past Medical  History:  Diagnosis Date  . Anemia    takes iron supplement  . Anxiety   . Asthma    daily and prn inhalers  . Bone spur    Left foot  . Chest pain    a. 10/2015: NST showing possible mild ischemia but most consistent with breast attenuation. Low-risk study.   . Chronic diastolic CHF (congestive heart failure) ([place])   . DDD (degenerative disc disease), cervical   . Degenerative disc disease   . Diabetes mellitus ([place])   . Essential hypertension   . Family history of early CAD   . Former tobacco use   . GERD (gastroesophageal reflux disease)   . GERD (gastroesophageal reflux disease)   . Headache(784.0)    migraine-like  . History of TIA (transient ischemic attack) early 2013   no weakness or deficits  . History of UTI   . Hyperlipidemia   . Insomnia   . Migraines   . Morbid obesity ([place])   . Partial tear of rotator cuff(726.13)   . Psoriatic arthritis ([place])   . Shoulder impingement 04/2012   left  . Sleep apnea    uses CPAP nightly    Past Surgical History:  Procedure Laterality Date  . ABDOMINAL HYSTERECTOMY  03/28/2002   complete  . ANTERIOR CERVICAL DECOMP/DISCECTOMY FUSION  02/18/2010   C6-7  . BREAST BIOPSY Right   . DILATION AND CURETTAGE OF UTERUS     2000  . FOOT SURGERY Right 2015  .  ROTATOR CUFF REPAIR Left 2013  . TOTAL KNEE ARTHROPLASTY  09/07/2009   left  . TOTAL KNEE ARTHROPLASTY  10/13/2008   right  . UMBILICAL HERNIA REPAIR  03/28/2002    Current Medications: Outpatient Medications Prior to Visit  Medication Sig Dispense Refill  . acidophilus (RISAQUAD) CAPS Take 1 capsule by mouth every morning.     Marland Kitchen amoxicillin (AMOXIL) 500 MG capsule Take 500 mg by mouth as needed. Before dental procedures    . aspirin 81 MG tablet Take 81 mg by mouth daily as needed for pain.    Marland Kitchen atorvastatin (LIPITOR) 20 MG tablet Take 20 mg by mouth every evening.     . baclofen (LIORESAL) 10 MG tablet Take 20 mg by mouth as needed. Two per week    . beclomethasone  (QVAR) 80 MCG/ACT inhaler Inhale 2 puffs into the lungs as needed.     . Calcium Carbonate Antacid (TUMS E-X PO) Take by mouth.    . COSENTYX SENSOREADY PEN 150 MG/ML SOAJ INJECT ONE PEN (150 MG) SUBCUTANEOUSLY EVERY 4 WEEKS. REFRIGERATE. ALLOW 15 TO 30 MINUTES AT ROOM TEMP PRIOR TO ADMINISTRATION. 3 pen 0  . cyclobenzaprine (FLEXERIL) 10 MG tablet Take 10 mg by mouth 2 (two) times daily as needed for muscle spasms.    . diclofenac sodium (VOLTAREN) 1 % GEL Apply topically 4 (four) times daily.    . DULoxetine (CYMBALTA) 60 MG capsule TK 1 C PO D  3  . ferrous sulfate 325 (65 FE) MG tablet Take 325 mg by mouth 4 (four) times a week. Sunday, Tuesday, Thursday, and saturday    . furosemide (LASIX) 40 MG tablet TAKE 1 TABLET(40 MG) BY MOUTH DAILY 90 tablet 0  . HYDROcodone-acetaminophen (NORCO/VICODIN) 5-325 MG per tablet Take 1 tablet by mouth as needed for moderate pain.     . hydrOXYzine (ATARAX/VISTARIL) 25 MG tablet Take 25 mg by mouth every 8 (eight) hours as needed. For itching.    . loratadine (CLARITIN) 10 MG tablet Take 10 mg by mouth daily.     Marland Kitchen LORazepam (ATIVAN) 0.5 MG tablet Take 0.5 mg by mouth as needed for anxiety.     . metFORMIN (GLUCOPHAGE) 500 MG tablet 500 mg.     . mometasone (NASONEX) 50 MCG/ACT nasal spray Place 2 sprays into the nose daily.    . Multiple Vitamin (MULITIVITAMIN WITH MINERALS) TABS Take 1 tablet by mouth daily.    . mupirocin ointment (BACTROBAN) 2 % Apply 1 application topically daily. And cover 22 g 0  . nitroGLYCERIN (NITRO-DUR) 0.2 mg/hr patch Place 1 patch (0.2 mg total) onto the skin daily. 30 patch 12  . NON FORMULARY at bedtime. CPAP    . Olopatadine HCl (PATADAY OP) Apply to eye.    Marland Kitchen omeprazole (PRILOSEC) 20 MG capsule Take 20 mg by mouth 2 (two) times daily.     . ONE TOUCH ULTRA TEST test strip     . ONETOUCH DELICA LANCETS 40J MISC     . potassium chloride (KLOR-CON) 20 MEQ packet Take 40 mEq by mouth 2 (two) times daily.    . potassium  chloride SA (K-DUR,KLOR-CON) 20 MEQ tablet TK 2 TS PO BID  3  . Topiramate ER (QUDEXY XR) 200 MG CS24 Take 200 mg by mouth at bedtime.    . vitamin B-12 (CYANOCOBALAMIN) 1000 MCG tablet Take 500 mcg by mouth daily.     Marland Kitchen zolpidem (AMBIEN) 10 MG tablet Take 10 mg by mouth at bedtime  as needed for sleep.    . furosemide (LASIX) 40 MG tablet   3  . furosemide (LASIX) 80 MG tablet Take 80 mg by mouth daily.    . naproxen (NAPROSYN) 500 MG tablet Take 1 tablet (500 mg total) by mouth 2 (two) times daily with a meal. (Patient not taking: Reported on 03/01/2017) 60 tablet 0  . valsartan (DIOVAN) 160 MG tablet Take 1 tablet (160 mg total) by mouth daily. (Patient not taking: Reported on 06/26/2017) 30 tablet 11   No facility-administered medications prior to visit.      Allergies:   Patient has no known allergies.   Social History   Social History  . Marital status: Married    Spouse name: N/A  . Number of children: 5  . Years of education: N/A   Occupational History  . 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 Smoking status: Former Smoker    Packs/day: 0.50    Years: 20.00    Types: Cigarettes    Quit date: 10/02/1999  . Smokeless tobacco: Never Used  . Alcohol use No  . Drug use: No  . Sexual activity: Yes    Birth control/ protection: None   Other Topics Concern  . Not on file   Social History Narrative  . No narrative on file     Family History:  The patient's family history includes Breast cancer in her sister and sister; Cancer in her maternal grandmother and paternal grandfather; Coronary artery disease in her sister; Diabetes in her father, maternal grandmother, mother, paternal grandfather, sister, and sister; Hypertension in her father, maternal grandmother, and mother; Kidney disease in her father.   Review of Systems:   Please see the history of present illness.     General:  No chills, fever, or night sweats. Positive for weight gain.    Cardiovascular:  No chest pain, dyspnea on exertion, edema, orthopnea, palpitations, paroxysmal nocturnal dyspnea. Dermatological: No rash, lesions/masses Respiratory: No cough, dyspnea Urologic: No hematuria, dysuria Abdominal:   No nausea, vomiting, diarrhea, bright red blood per rectum, melena, or hematemesis Neurologic:  No visual changes, wkns, changes in mental status. All other systems reviewed and are otherwise negative except as noted above.   Physical Exam:    VS:  BP 112/60   Pulse 96   Ht 5\' 1"  (1.549 m)   Wt 278 lb (126.1 kg)   BMI 52.53 kg/m    General: Well developed, morbidly obese African American female appearing in no acute distress. Head: Normocephalic, atraumatic, sclera non-icteric, no xanthomas, nares are without discharge.  Neck: No carotid bruits. JVD difficult to assess secondary to body habitus.  Lungs: Respirations regular and unlabored, without wheezes or rales.  Heart: Regular rate and rhythm. No S3 or S4.  No murmur, no rubs, or gallops appreciated. Abdomen: Soft, non-tender, non-distended with normoactive bowel sounds. No hepatomegaly. No rebound/guarding. No obvious abdominal masses. Msk:  Strength and tone appear normal for age. No joint deformities or effusions. Extremities: No clubbing or cyanosis. No lower extremity edema.  Distal pedal pulses are 2+ bilaterally. Neuro: Alert and oriented X 3. Moves all extremities spontaneously. No focal deficits noted. Psych:  Responds to questions appropriately with a normal affect. Skin: No rashes or lesions noted  Wt Readings from Last 3 Encounters:  06/26/17 278 lb (126.1 kg)  04/30/17 275 lb (124.7 kg)  03/01/17 268 lb (121.6 kg)     Studies/Labs Reviewed:   EKG:  EKG is  ordered today.  The ekg ordered today demonstrates NSR, HR 96, with no acute ST or T-wave changes.   Recent Labs: 05/30/2017: ALT 13; BUN 15; Creat 0.91; Hemoglobin 11.8; Platelets 256; Potassium 4.6; Sodium 139   Lipid Panel     Component Value Date/Time   CHOL 143 03/12/2013 0200   TRIG 128 03/12/2013 0200   HDL 42 03/12/2013 0200   CHOLHDL 3.4 03/12/2013 0200   VLDL 26 03/12/2013 0200   LDLCALC 75 03/12/2013 0200    Additional studies/ records that were reviewed today include:   Echocardiogram: 06/2015 Study Conclusions  - Left ventricle: The cavity size was normal. There was mild focal   basal hypertrophy of the septum. Systolic function was normal.   The estimated ejection fraction was in the range of 55% to 60%.   Wall motion was normal; there were no regional wall motion   abnormalities. Features are consistent with a pseudonormal left   ventricular filling pattern, with concomitant abnormal relaxation   and increased filling pressure (grade 2 diastolic dysfunction).   Doppler parameters are consistent with high ventricular filling   pressure. - Aortic valve: Poorly visualized. - Mitral valve: There was trivial regurgitation.   NST: 10/2015  Nuclear stress EF: 63%.  There was no ST segment deviation noted during stress.  There is a small defect of mild severity present in the mid anteroseptal and apical septal location. The defect is partially reversible. This is most likely secondary to shifting breast attenuation but cannot rule out a very subtle area of ischemia.  The left ventricular ejection fraction is normal (55-65%).    Assessment:    1. Chronic diastolic heart failure ([place])   2. Atypical chest pain   3. Essential hypertension   4. Mixed hyperlipidemia   5. Obstructive sleep apnea syndrome   6. Morbid obesity, unspecified obesity type ([place])      Plan:   In order of problems listed above:  1. Chronic Diastolic CHF - Most recent echo in 06/2015 showed a preserved EF 55-60% with grade 2 diastolic dysfunction. - She does not appear volume overloaded by physical examination but this is limited secondary to body habitus. Denies any recent dyspnea on exertion, orthopnea, PND,  or lower extremity edema. - creatinine recently checked by her PCP and WNL at 0.91 with K+ of 4.6. Will continue on Lasix 40mg  daily with K+ supplementation.   2. Atypical Chest Pain - was evaluated at [facility] ED in 04/2017 for chest pain which she describes as sharp discomfort in her chest, with  episodes only lasting for seconds at a time and no association with exertion. EKG showed no acute abnormalities and troponin values were normal.  - she denies any repeat episodes since. Has been walking around her neighborhood without any anginal symptoms.  - recent NST from 2017 was low-risk and showed only mild ischemia vs. breast attenuation. Would not pursue further ischemic evaluation at this time. She will make [place] aware if she develops recurrent symptoms.   3. HTN - BP is well-controlled at 112/60 during his visit. - Continue Irbesartan 150 mg daily (recently switched to this due to the Valsartan recall).  4. HLD - followed by PCP. Remains on Atorvastatin 20mg  daily.   5. OSA - continued compliance with CPAP encouraged.   6. Morbid Obesity - BMI is elevated to 52. - we discussed referral to a dietitian and she will consider this. Also discussed bariatric surgery but she wishes to avoid invasive options at  this time.    Medication Adjustments/Labs and Tests Ordered: Current medicines are reviewed at length with the patient today.  Concerns regarding medicines are outlined above.  Medication changes, Labs and Tests ordered today are listed in the Patient Instructions below. Patient Instructions  Bernerd Pho, [place] recommends that you schedule a follow-up appointment in 12 months with Dr Sallyanne Kuster. You will receive a reminder letter in the mail two months in advance. If you don't receive a letter, please call our office to schedule the follow-up appointment.  If you need a refill on your cardiac medications before your next appointment, please call your pharmacy.    Signed, Erma Heritage, PA-C  06/26/2017 7:43 PM    [facility] [place], [place], [place]  [address] Phone: [phone]; Fax: [phone]  [address], [place],  [address] Phone: [phone]

## 2017-06-26 ENCOUNTER — Ambulatory Visit (INDEPENDENT_AMBULATORY_CARE_PROVIDER_SITE_OTHER): Payer: BLUE CROSS/BLUE SHIELD | Admitting: Student

## 2017-06-26 ENCOUNTER — Encounter: Payer: Self-pay | Admitting: Student

## 2017-06-26 VITALS — BP 112/60 | HR 96 | Ht 61.0 in | Wt 278.0 lb

## 2017-06-26 DIAGNOSIS — I10 Essential (primary) hypertension: Secondary | ICD-10-CM

## 2017-06-26 DIAGNOSIS — E782 Mixed hyperlipidemia: Secondary | ICD-10-CM | POA: Diagnosis not present

## 2017-06-26 DIAGNOSIS — I5032 Chronic diastolic (congestive) heart failure: Secondary | ICD-10-CM | POA: Diagnosis not present

## 2017-06-26 DIAGNOSIS — R0789 Other chest pain: Secondary | ICD-10-CM

## 2017-06-26 DIAGNOSIS — G4733 Obstructive sleep apnea (adult) (pediatric): Secondary | ICD-10-CM

## 2017-06-26 NOTE — Patient Instructions (Signed)
Bernerd Pho, PA recommends that you schedule a follow-up appointment in 12 months with Dr Sallyanne Kuster. You will receive a reminder letter in the mail two months in advance. If you don't receive a letter, please call our office to schedule the follow-up appointment.  If you need a refill on your cardiac medications before your next appointment, please call your pharmacy.

## 2017-07-11 ENCOUNTER — Ambulatory Visit: Payer: BLUE CROSS/BLUE SHIELD | Admitting: Cardiovascular Disease

## 2017-07-20 ENCOUNTER — Other Ambulatory Visit: Payer: Self-pay | Admitting: Rheumatology

## 2017-07-20 NOTE — Telephone Encounter (Signed)
03/01/17 last visit 08/14/17 next visit Labs: 05/30/17 Stable TB Gold: 12/13/16 Neg  Okay to refill per Dr. Estanislado Pandy

## 2017-08-01 NOTE — Progress Notes (Signed)
Office Visit Note  Patient: [name]             Date of Birth: [birth year]           MRN: [id number]             PCP: Shon Baton, MD Referring: Shon Baton, MD Visit Date: 08/14/2017 Occupation: '@GUAROCC' @    Subjective:  Other (left hip/leg pain, right ankle pain )   History of Present Illness: [name] is a 56 y.o. female with history of the spondyloarthropathy osteoarthritis and disc disease. According to patient she's been doing better on Cosyntex. Recently she's been experiencing some pain in her left hip and her left calf. She also describes some discomfort in her right ankle joint. The right ankle has been swelling. She states she has no difficulty sleeping on the left side which she has difficulty coming out of the car. She continues to have lower back pain. She had seen Dr. Yates Decamp for diarrhea. She states she was diagnosed with C. difficile and was treated with vancomycin 2. Diarrhea has resolved now.  Activities of Daily Living:  Patient reports morning stiffness for 15 minutes.   Patient Reports nocturnal pain.  Difficulty dressing/grooming: Denies Difficulty climbing stairs: Reports Difficulty getting out of chair: Reports Difficulty using hands for taps, buttons, cutlery, and/or writing: Denies   Review of Systems  Constitutional: Negative for fatigue, night sweats, weight gain, weight loss and weakness.  HENT: Positive for mouth dryness. Negative for mouth sores, trouble swallowing, trouble swallowing and nose dryness.   Eyes: Negative for pain, redness, visual disturbance and dryness.  Respiratory: Negative for cough, shortness of breath and difficulty breathing.   Cardiovascular: Negative for chest pain, palpitations, hypertension, irregular heartbeat and swelling in legs/feet.  Gastrointestinal: Negative for blood in stool, constipation and diarrhea.  Endocrine: Negative for increased urination.  Genitourinary: Negative for vaginal dryness.    Musculoskeletal: Positive for arthralgias, joint pain, joint swelling and morning stiffness. Negative for myalgias, muscle weakness, muscle tenderness and myalgias.  Skin: Negative for color change, rash, hair loss, skin tightness, ulcers and sensitivity to sunlight.  Allergic/Immunologic: Negative for susceptible to infections.  Neurological: Positive for headaches. Negative for dizziness, memory loss and night sweats.       History of migraines  Hematological: Negative for swollen glands.  Psychiatric/Behavioral: Positive for depressed mood. Negative for sleep disturbance. The patient is not nervous/anxious.     PMFS History:  Patient Active Problem List   Diagnosis Date Noted  . Spondyloarthropathy ([place]) 07/26/2016    Priority: High  . High risk medication use 07/26/2016    Priority: High  . Chronic low back pain 07/26/2016    Priority: Medium  . H/O total knee replacement, bilateral 07/26/2016    Priority: Medium  . DJD (degenerative joint disease), cervical     Priority: Medium  . Chronic diastolic heart failure ([place]) 11/29/2015    Priority: Low  . Lung nodule seen on imaging study, CTA of chest, 6 mm nodule, followed by Dr. Virgina Jock 03/25/2013    Priority: Low  . Morbid obesity ([place]) 03/12/2013    Priority: Low  . Hyperlipidemia     Priority: Low  . Sleep apnea     Priority: Low  . Hypertension     Priority: Low  . History of TIA (transient ischemic attack) 9/13     Priority: Low  . Dyspnea 06/03/2015  . Family history of coronary artery disease 03/12/2013  . Chest pain with  low risk of acute coronary syndrome - most likely musculoskeletal, negative nuc study 03/11/2013  . Full thickness rotator cuff tear 05/15/2012  . Partial tear of rotator cuff(726.13)   . Shoulder impingement 04/12/2012    Past Medical History:  Diagnosis Date  . Anemia    takes iron supplement  . Anxiety   . Asthma    daily and prn inhalers  . Bone spur    Left foot  . Chest pain    a.  10/2015: NST showing possible mild ischemia but most consistent with breast attenuation. Low-risk study.   . Chronic diastolic CHF (congestive heart failure) ([place])   . DDD (degenerative disc disease), cervical   . Degenerative disc disease   . Diabetes mellitus ([place])   . Essential hypertension   . Family history of early CAD   . Former tobacco use   . GERD (gastroesophageal reflux disease)   . GERD (gastroesophageal reflux disease)   . Headache(784.0)    migraine-like  . History of TIA (transient ischemic attack) early 2013   no weakness or deficits  . History of UTI   . Hyperlipidemia   . Insomnia   . Migraines   . Morbid obesity ([place])   . Partial tear of rotator cuff(726.13)   . Psoriatic arthritis ([place])   . Shoulder impingement 04/2012   left  . Sleep apnea    uses CPAP nightly    Family History  Problem Relation Age of Onset  . Diabetes Mother   . Hypertension Mother   . Kidney disease Father   . Hypertension Father   . Diabetes Father   . Breast cancer Sister   . Diabetes Sister   . Breast cancer Sister   . Cancer Maternal Grandmother   . Diabetes Maternal Grandmother   . Hypertension Maternal Grandmother   . Diabetes Paternal Grandfather   . Cancer Paternal Grandfather   . Coronary artery disease Sister   . Diabetes Sister    Past Surgical History:  Procedure Laterality Date  . ABDOMINAL HYSTERECTOMY  03/28/2002   complete  . ANTERIOR CERVICAL DECOMP/DISCECTOMY FUSION  02/18/2010   C6-7  . BREAST BIOPSY Right   . DILATION AND CURETTAGE OF UTERUS     2000  . FOOT SURGERY Right 2015  . ROTATOR CUFF REPAIR Left 2013  . TOTAL KNEE ARTHROPLASTY  09/07/2009   left  . TOTAL KNEE ARTHROPLASTY  10/13/2008   right  . UMBILICAL HERNIA REPAIR  03/28/2002   Social History   Social History Narrative  . Not on file     Objective: Vital Signs: BP 134/70 (BP Location: Left Arm, Patient Position: Sitting, Cuff Size: Normal)   Pulse 82   Resp 18   Ht '5\' 2"'  (1.575  m)   Wt 278 lb (126.1 kg)   BMI 50.85 kg/m    Physical Exam  Constitutional: She is oriented to person, place, and time. She appears well-developed and well-nourished.  HENT:  Head: Normocephalic and atraumatic.  Eyes: Conjunctivae and EOM are normal.  Neck: Normal range of motion.  Cardiovascular: Normal rate, regular rhythm, normal heart sounds and intact distal pulses.  Pulmonary/Chest: Effort normal and breath sounds normal.  Abdominal: Soft. Bowel sounds are normal.  Lymphadenopathy:    She has no cervical adenopathy.  Neurological: She is alert and oriented to person, place, and time.  Skin: Skin is warm and dry. Capillary refill takes less than 2 seconds.  Psychiatric: She has a normal mood and affect. Her  behavior is normal.  Nursing note and vitals reviewed.    Musculoskeletal Exam: C-spine and thoracic spine good range of motion. She has limited range of motion of her lumbar spine. Shoulder joints elbow joints wrist joint MCPs PIPs DIPs are good range of motion with no synovitis. She has bilateral total knee replacement with some warmth without any swelling or effusion. She is good range of motion of bilateral hip joints. She is tenderness on palpation over left trochanteric bursa consistent with trochanteric bursitis. She has discomfort range of motion of her right ankle joint without any warmth swelling or effusion. All other joints of would range of motion with no synovitis.  CDAI Exam: CDAI Homunculus Exam:   Tenderness:  RLE: tibiotalar  Joint Counts:  CDAI Tender Joint count: 0 CDAI Swollen Joint count: 0  Global Assessments:  Patient Global Assessment: 8 Provider Global Assessment: 4  CDAI Calculated Score: 12    Investigation: No additional findings.Tb Gold: 12/13/2016 Negative  CBC Latest Ref Rng & Units 05/30/2017 04/30/2017 03/01/2017  WBC 3.8 - 10.8 Thousand/uL 8.0 8.8 7.9  Hemoglobin 11.7 - 15.5 g/dL 11.8 12.2 11.5(L)  Hematocrit 35.0 - 45.0 % 37.3  37.9 37.3  Platelets 140 - 400 Thousand/uL 256 256 252   CMP Latest Ref Rng & Units 05/30/2017 04/30/2017 03/01/2017  Glucose 65 - 99 mg/dL 107(H) 133(H) 79  BUN 7 - 25 mg/dL '15 13 16  ' Creatinine 0.50 - 1.05 mg/dL 0.91 1.01(H) 0.99  Sodium 135 - 146 mmol/L 139 138 140  Potassium 3.5 - 5.3 mmol/L 4.6 3.8 4.6  Chloride 98 - 110 mmol/L 107 107 107  CO2 20 - 32 mmol/L '25 26 24  ' Calcium 8.6 - 10.4 mg/dL 9.4 9.3 9.5  Total Protein 6.1 - 8.1 g/dL 6.9 - 7.2  Total Bilirubin 0.2 - 1.2 mg/dL 0.3 - 0.3  Alkaline Phos 33 - 130 U/L - - 122  AST 10 - 35 U/L 14 - 16  ALT 6 - 29 U/L 13 - 18    Imaging: Xr Hip Unilat W Or W/o Pelvis 2-3 Views Left  Result Date: 08/14/2017  mild spurring was noted. No significant hip joint narrowing was noted.  Xr Ankle 2 Views Right  Result Date: 08/14/2017 No tibiotalar or subtalar joint space narrowing was noted. Inferior and posterior calcaneal spurs were noted. Normal x-ray of the ankle joint.    Speciality Comments: No specialty comments available.    Procedures:  Large Joint Inj: L greater trochanter on 08/14/2017 8:52 AM Indications: pain and diagnostic evaluation Details: 27 G 1.5 in needle, lateral approach  Arthrogram: No  Medications: 1.5 mL lidocaine 1 %; 40 mg triamcinolone acetonide 40 MG/ML Aspirate: 0 mL Outcome: tolerated well, no immediate complications Procedure, treatment alternatives, risks and benefits explained, specific risks discussed. Consent was given by the patient. Immediately prior to procedure a time out was called to verify the correct patient, procedure, equipment, support staff and site/side marked as required. Patient was prepped and draped in the usual sterile fashion.     Allergies: Patient has no known allergies.   Assessment / Plan:     Visit Diagnoses: Spondyloarthropathy ([place]) - MRI positive for sacroiliitis, HLA-B27 negative, elevated ESR. Patient is clinically doing much better on Cosyntex. I do not see any  synovitis today.  High risk medication use - Cosentyx once a month. Her labs are stable. We will continue to monitor her labs every 3 months.  Pain of left hip joint - she has good  range of motion of her hip joint. She tenderness on palpation of her left trochanteric bursa consistent with trochanteric bursitis. Plan: XR HIP UNILAT W OR W/O PELVIS 2-3 VIEWS LEFT. X-ray was unremarkable Different treatment options were discussed after informed consent was obtained left trochanteric bursa was injected with 1-1/2 mL of 1% lidocaine and 40 mg of Kenalog. She's been on advised to monitor blood sugar and blood pressure closely. She will contact her PCP in case of the sugar goes up.  Acute right ankle pain - Plan: XR Ankle 2 Views Right: X-ray was unremarkable.  DDD (degenerative disc disease), cervical - she is fairly good range of motion without much discomfort.  Arthropathy of lumbar facet joint: She has chronic lower back pain.  H/O total knee replacement, bilateral: Doing well without any swelling or effusion.  History of diarrhea: Patient was evaluated by her gastroenterologist. According to patient she had C. difficile and she received vancomycin 2. The diarrhea has resolved now.  Lung nodule seen on imaging study, CTA of chest, 6 mm nodule, followed by Dr. Virgina Jock  Other medical problems are listed as follows:  History of diabetes mellitus: Patient will monitor blood sugar closely and contact her PCP if her blood sugar goes up.  Chronic diastolic heart failure ([facility])  History of TIA (transient ischemic attack) - 9/13  History of sleep apnea  History of obesity  History of hypertension  History of hyperlipidemia    Orders: Orders Placed This Encounter  Procedures  . Large Joint Inj: L greater trochanter  . XR HIP UNILAT W OR W/O PELVIS 2-3 VIEWS LEFT  . XR Ankle 2 Views Right   No orders of the defined types were placed in this encounter.   Face-to-face time spent with  patient was 30 minutes. Greater than 50% of time was spent in counseling and coordination of care.  Follow-Up Instructions: Return in about 5 months (around 01/12/2018) for Spondyloarthropathy.   Bo Merino, MD  Note - This record has been created using Editor, commissioning.  Chart creation errors have been sought, but may not always  have been located. Such creation errors do not reflect on  the standard of medical care.

## 2017-08-02 ENCOUNTER — Ambulatory Visit: Payer: BLUE CROSS/BLUE SHIELD | Admitting: Rheumatology

## 2017-08-14 ENCOUNTER — Ambulatory Visit (INDEPENDENT_AMBULATORY_CARE_PROVIDER_SITE_OTHER): Payer: Self-pay

## 2017-08-14 ENCOUNTER — Encounter: Payer: Self-pay | Admitting: Rheumatology

## 2017-08-14 ENCOUNTER — Ambulatory Visit: Payer: BLUE CROSS/BLUE SHIELD | Admitting: Rheumatology

## 2017-08-14 VITALS — BP 134/70 | HR 82 | Resp 18 | Ht 62.0 in | Wt 278.0 lb

## 2017-08-14 DIAGNOSIS — Z8673 Personal history of transient ischemic attack (TIA), and cerebral infarction without residual deficits: Secondary | ICD-10-CM | POA: Diagnosis not present

## 2017-08-14 DIAGNOSIS — R911 Solitary pulmonary nodule: Secondary | ICD-10-CM

## 2017-08-14 DIAGNOSIS — M25552 Pain in left hip: Secondary | ICD-10-CM | POA: Diagnosis not present

## 2017-08-14 DIAGNOSIS — Z87898 Personal history of other specified conditions: Secondary | ICD-10-CM | POA: Diagnosis not present

## 2017-08-14 DIAGNOSIS — M4690 Unspecified inflammatory spondylopathy, site unspecified: Secondary | ICD-10-CM

## 2017-08-14 DIAGNOSIS — M5030 Other cervical disc degeneration, unspecified cervical region: Secondary | ICD-10-CM

## 2017-08-14 DIAGNOSIS — Z79899 Other long term (current) drug therapy: Secondary | ICD-10-CM

## 2017-08-14 DIAGNOSIS — Z96653 Presence of artificial knee joint, bilateral: Secondary | ICD-10-CM | POA: Diagnosis not present

## 2017-08-14 DIAGNOSIS — I5032 Chronic diastolic (congestive) heart failure: Secondary | ICD-10-CM | POA: Diagnosis not present

## 2017-08-14 DIAGNOSIS — M47816 Spondylosis without myelopathy or radiculopathy, lumbar region: Secondary | ICD-10-CM | POA: Diagnosis not present

## 2017-08-14 DIAGNOSIS — Z8669 Personal history of other diseases of the nervous system and sense organs: Secondary | ICD-10-CM | POA: Diagnosis not present

## 2017-08-14 DIAGNOSIS — M25571 Pain in right ankle and joints of right foot: Secondary | ICD-10-CM

## 2017-08-14 DIAGNOSIS — Z8679 Personal history of other diseases of the circulatory system: Secondary | ICD-10-CM

## 2017-08-14 DIAGNOSIS — Z8639 Personal history of other endocrine, nutritional and metabolic disease: Secondary | ICD-10-CM

## 2017-08-14 DIAGNOSIS — M47819 Spondylosis without myelopathy or radiculopathy, site unspecified: Secondary | ICD-10-CM

## 2017-08-14 MED ORDER — LIDOCAINE HCL 1 % IJ SOLN
1.5000 mL | INTRAMUSCULAR | Status: AC | PRN
  Administered 2017-08-14: 1.5 mL

## 2017-08-14 MED ORDER — TRIAMCINOLONE ACETONIDE 40 MG/ML IJ SUSP
40.0000 mg | INTRAMUSCULAR | Status: AC | PRN
  Administered 2017-08-14: 40 mg via INTRA_ARTICULAR

## 2017-08-14 NOTE — Patient Instructions (Addendum)
Standing Labs We placed an order today for your standing lab work.    Please come back and get your standing labs in March and every 3 months  We have open lab Monday through Friday from 8:30-11:30 AM and 1:30-4 PM at the office of Dr. Bo Merino.   The office is located at [address], [place], [place], [place] [address] No appointment is necessary.   Labs are drawn by [facility].  You may receive a bill from [facility] for your lab work. If you have any questions regarding directions or hours of operation,  please call [phone].    Trochanteric Bursitis Trochanteric bursitis is a condition that causes hip pain. Trochanteric bursitis happens when fluid-filled sacs (bursae) in the hip get irritated. Normally these sacs absorb shock and help strong bands of tissue (tendons) in your hip glide smoothly over each other and over your hip bones. What are the causes? This condition results from increased friction between the hip bones and the tendons that go over them. This condition can happen if you:  Have weak hips.  Use your hip muscles too much (overuse).  Get hit in the hip.  What increases the risk? This condition is more likely to develop in:  Women.  Adults who are middle-aged or older.  People with arthritis or a spinal condition.  People with weak buttocks muscles (gluteal muscles).  People who have one leg that is shorter than the other.  People who participate in certain kinds of athletic activities, such as: ? Running sports, especially long-distance running. ? Contact sports, like football or martial arts. ? Sports in which falls may occur, like skiing.  What are the signs or symptoms? The main symptom of this condition is pain and tenderness over the point of your hip. The pain may be:  Sharp and intense.  Dull and achy.  Felt on the outside of your thigh.  It may increase when you:  Lie on your side.  Walk or run.  Go up on  stairs.  Sit.  Stand up after sitting.  Stand for long periods of time.  How is this diagnosed? This condition may be diagnosed based on:  Your symptoms.  Your medical history.  A physical exam.  Imaging tests, such as: ? X-rays to check your bones. ? An MRI or ultrasound to check your tendons and muscles.  During your physical exam, your health care provider will check the movement and strength of your hip. He or she may press on the point of your hip to check for pain. How is this treated? This condition may be treated by:  Resting.  Reducing your activity.  Avoiding activities that cause pain.  Using crutches, a cane, or a walker to decrease the strain on your hip.  Taking medicine to help with swelling.  Having medicine injected into the bursae to help with swelling.  Using ice, heat, and massage therapy for pain relief.  Physical therapy exercises for strength and flexibility.  Surgery (rare).  Follow these instructions at home: Activity  Rest.  Avoid activities that cause pain.  Return to your normal activities as told by your health care provider. Ask your health care provider what activities are safe for you. Managing pain, stiffness, and swelling  Take over-the-counter and prescription medicines only as told by your health care provider.  If directed, apply heat to the injured area as told by your health care provider. ? Place a towel between your skin and the heat  source. ? Leave the heat on for 20-30 minutes. ? Remove the heat if your skin turns bright red. This is especially important if you are unable to feel pain, heat, or cold. You may have a greater risk of getting burned.  If directed, apply ice to the injured area: ? Put ice in a plastic bag. ? Place a towel between your skin and the bag. ? Leave the ice on for 20 minutes, 2-3 times a day. General instructions  If the affected leg is one that you use for driving, ask your health care  provider when it is safe to drive.  Use crutches, a cane, or a walker as told by your health care provider.  If one of your legs is shorter than the other, get fitted for a shoe insert.  Lose weight if you are overweight. How is this prevented?  Wear supportive footwear that is appropriate for your sport.  If you have hip pain, start any new exercise or sport slowly.  Maintain physical fitness, including: ? Strength. ? Flexibility. Contact a health care provider if:  Your pain does not improve with 2-4 weeks. Get help right away if:  You develop severe pain.  You have a fever.  You develop increased redness over your hip.  You have a change in your bowel function or bladder function.  You cannot control the muscles in your feet. This information is not intended to replace advice given to you by your health care provider. Make sure you discuss any questions you have with your health care provider. Document Released: 10/06/2004 Document Revised: 05/04/2016 Document Reviewed: 08/14/2015 Elsevier Interactive Patient Education  [employer].

## 2017-08-15 LAB — CBC WITH DIFFERENTIAL/PLATELET
Basophils Absolute: 51 cells/uL (ref 0–200)
Basophils Relative: 0.5 %
Eosinophils Absolute: 253 cells/uL (ref 15–500)
Eosinophils Relative: 2.5 %
HCT: 37.8 % (ref 35.0–45.0)
Hemoglobin: 12 g/dL (ref 11.7–15.5)
Lymphs Abs: 2010 cells/uL (ref 850–3900)
MCH: 25.2 pg — ABNORMAL LOW (ref 27.0–33.0)
MCHC: 31.7 g/dL — ABNORMAL LOW (ref 32.0–36.0)
MCV: 79.2 fL — ABNORMAL LOW (ref 80.0–100.0)
MPV: 9 fL (ref 7.5–12.5)
Monocytes Relative: 7.8 %
Neutro Abs: 6999 cells/uL (ref 1500–7800)
Neutrophils Relative %: 69.3 %
Platelets: 268 10*3/uL (ref 140–400)
RBC: 4.77 10*6/uL (ref 3.80–5.10)
RDW: 15 % (ref 11.0–15.0)
Total Lymphocyte: 19.9 %
WBC mixed population: 788 cells/uL (ref 200–950)
WBC: 10.1 10*3/uL (ref 3.8–10.8)

## 2017-08-15 LAB — COMPLETE METABOLIC PANEL WITH GFR
AG Ratio: 1.4 (calc) (ref 1.0–2.5)
ALT: 14 U/L (ref 6–29)
AST: 13 U/L (ref 10–35)
Albumin: 4.2 g/dL (ref 3.6–5.1)
Alkaline phosphatase (APISO): 139 U/L — ABNORMAL HIGH (ref 33–130)
BUN: 11 mg/dL (ref 7–25)
CO2: 25 mmol/L (ref 20–32)
Calcium: 9.6 mg/dL (ref 8.6–10.4)
Chloride: 106 mmol/L (ref 98–110)
Creat: 0.9 mg/dL (ref 0.50–1.05)
GFR, Est African American: 83 mL/min/{1.73_m2} (ref >=60)
GFR, Est Non African American: 71 mL/min/{1.73_m2} (ref >=60)
Globulin: 3 g/dL (calc) (ref 1.9–3.7)
Glucose, Bld: 97 mg/dL (ref 65–99)
Potassium: 4.3 mmol/L (ref 3.5–5.3)
Sodium: 140 mmol/L (ref 135–146)
Total Bilirubin: 0.4 mg/dL (ref 0.2–1.2)
Total Protein: 7.2 g/dL (ref 6.1–8.1)

## 2017-08-15 NOTE — Progress Notes (Signed)
stable °

## 2017-08-28 ENCOUNTER — Other Ambulatory Visit: Payer: Self-pay | Admitting: Cardiovascular Disease

## 2017-08-28 NOTE — Telephone Encounter (Signed)
REFILL 

## 2017-09-19 ENCOUNTER — Telehealth: Payer: Self-pay | Admitting: *Deleted

## 2017-09-19 NOTE — Telephone Encounter (Signed)
Supply order signed by Dr. Claiborne Billings and returned to [facility]

## 2017-11-30 ENCOUNTER — Other Ambulatory Visit: Payer: Self-pay | Admitting: Rheumatology

## 2017-11-30 NOTE — Telephone Encounter (Signed)
Last Visit: 08/14/17 Next Visit: 01/17/18 Labs :09/28/17 WNL Tb Gold: 12/13/16 Neg   Okay to refill per Dr. Estanislado Pandy

## 2018-01-04 NOTE — Progress Notes (Signed)
Office Visit Note  Patient: [name]             Date of Birth: [birth year]           MRN: [id number]             PCP: Shon Baton, MD Referring: Shon Baton, MD Visit Date: 01/17/2018 Occupation: '@GUAROCC' @    Subjective:  Pain in bilateral hands and left elbow.   History of Present Illness: [name] is a 57 y.o. female with history of a spondyloarthropathy, disc disease and osteoarthritis.  She states she has been having pain and discomfort in her left elbow which she describes over the medial epicondyle area.  She is been also having some discomfort in her bilateral hands which she describes over bilateral second PIP joints.  Some knee joint discomfort with muscle cramps in her bilateral lower extremities.  She has not had recurrence of C. difficile infection.  She states she has intermittent diarrhea.  There is no history of blood in stool.  Activities of Daily Living:  Patient reports morning stiffness for 30 minutes.   Patient Reports nocturnal pain.  Difficulty dressing/grooming: Denies Difficulty climbing stairs: Reports Difficulty getting out of chair: Reports Difficulty using hands for taps, buttons, cutlery, and/or writing: Reports   Review of Systems  Constitutional: Positive for fatigue. Negative for night sweats, weight gain and weight loss.  HENT: Positive for mouth dryness. Negative for mouth sores, trouble swallowing, trouble swallowing and nose dryness.   Eyes: Negative for pain, redness, visual disturbance and dryness.  Respiratory: Positive for shortness of breath. Negative for cough and difficulty breathing.        With exertion.  History of asthma  Cardiovascular: Positive for hypertension. Negative for chest pain, palpitations, irregular heartbeat and swelling in legs/feet.  Gastrointestinal: Negative for blood in stool, constipation and diarrhea.  Endocrine: Negative for increased urination.  Genitourinary: Negative for vaginal dryness.    Musculoskeletal: Positive for arthralgias, joint pain, myalgias, morning stiffness and myalgias. Negative for joint swelling, muscle weakness and muscle tenderness.  Skin: Negative for color change, rash, hair loss, skin tightness, ulcers and sensitivity to sunlight.  Allergic/Immunologic: Negative for susceptible to infections.  Neurological: Negative for dizziness, memory loss, night sweats and weakness.  Hematological: Negative for swollen glands.  Psychiatric/Behavioral: Positive for sleep disturbance. Negative for depressed mood. The patient is not nervous/anxious.     PMFS History:  Patient Active Problem List   Diagnosis Date Noted  . Spondyloarthropathy ([place]) 07/26/2016    Priority: High  . High risk medication use 07/26/2016    Priority: High  . Chronic low back pain 07/26/2016    Priority: Medium  . H/O total knee replacement, bilateral 07/26/2016    Priority: Medium  . DJD (degenerative joint disease), cervical     Priority: Medium  . Chronic diastolic heart failure ([place]) 11/29/2015    Priority: Low  . Lung nodule seen on imaging study, CTA of chest, 6 mm nodule, followed by Dr. Virgina Jock 03/25/2013    Priority: Low  . Morbid obesity ([place]) 03/12/2013    Priority: Low  . Hyperlipidemia     Priority: Low  . Sleep apnea     Priority: Low  . Hypertension     Priority: Low  . History of TIA (transient ischemic attack) 9/13     Priority: Low  . Dyspnea 06/03/2015  . Family history of coronary artery disease 03/12/2013  . Chest pain with low risk of acute coronary  syndrome - most likely musculoskeletal, negative nuc study 03/11/2013  . Full thickness rotator cuff tear 05/15/2012  . Partial tear of rotator cuff(726.13)   . Shoulder impingement 04/12/2012    Past Medical History:  Diagnosis Date  . Anemia    takes iron supplement  . Anxiety   . Asthma    daily and prn inhalers  . Bone spur    Left foot  . Chest pain    a. 10/2015: NST showing possible mild ischemia  but most consistent with breast attenuation. Low-risk study.   . Chronic diastolic CHF (congestive heart failure) ([place])   . DDD (degenerative disc disease), cervical   . Degenerative disc disease   . Diabetes mellitus ([place])   . Essential hypertension   . Family history of early CAD   . Former tobacco use   . GERD (gastroesophageal reflux disease)   . GERD (gastroesophageal reflux disease)   . Headache(784.0)    migraine-like  . History of TIA (transient ischemic attack) early 2013   no weakness or deficits  . History of UTI   . Hyperlipidemia   . Insomnia   . Migraines   . Morbid obesity ([place])   . Partial tear of rotator cuff(726.13)   . Psoriatic arthritis ([place])   . Shoulder impingement 04/2012   left  . Sleep apnea    uses CPAP nightly    Family History  Problem Relation Age of Onset  . Diabetes Mother   . Hypertension Mother   . Kidney disease Father   . Hypertension Father   . Diabetes Father   . Breast cancer Sister   . Diabetes Sister   . Breast cancer Sister   . Cancer Maternal Grandmother   . Diabetes Maternal Grandmother   . Hypertension Maternal Grandmother   . Diabetes Paternal Grandfather   . Cancer Paternal Grandfather   . Coronary artery disease Sister   . Diabetes Sister    Past Surgical History:  Procedure Laterality Date  . ABDOMINAL HYSTERECTOMY  03/28/2002   complete  . ANTERIOR CERVICAL DECOMP/DISCECTOMY FUSION  02/18/2010   C6-7  . BREAST BIOPSY Right   . DILATION AND CURETTAGE OF UTERUS     2000  . FOOT SURGERY Right 2015  . ROTATOR CUFF REPAIR Left 2013  . TOTAL KNEE ARTHROPLASTY  09/07/2009   left  . TOTAL KNEE ARTHROPLASTY  10/13/2008   right  . UMBILICAL HERNIA REPAIR  03/28/2002   Social History   Social History Narrative  . Not on file     Objective: Vital Signs: BP 126/79 (BP Location: Left Wrist, Patient Position: Sitting, Cuff Size: Normal)   Pulse 87   Resp 17   Ht '5\' 2"'  (1.575 m)   Wt 280 lb 8 oz (127.2 kg)   BMI  51.30 kg/m    Physical Exam  Constitutional: She is oriented to person, place, and time. She appears well-developed and well-nourished.  HENT:  Head: Normocephalic and atraumatic.  Eyes: Conjunctivae and EOM are normal.  Neck: Normal range of motion.  Cardiovascular: Normal rate, regular rhythm, normal heart sounds and intact distal pulses.  Pulmonary/Chest: Effort normal and breath sounds normal.  Abdominal: Soft. Bowel sounds are normal.  Lymphadenopathy:    She has no cervical adenopathy.  Neurological: She is alert and oriented to person, place, and time.  Skin: Skin is warm and dry. Capillary refill takes less than 2 seconds.  Psychiatric: She has a normal mood and affect. Her behavior is normal.  Nursing note and vitals reviewed.    Musculoskeletal Exam: C-spine thoracic spine good range of motion.  She has some tenderness over her SI joints.  She had discomfort range of motion of her left shoulder.  She had tenderness on palpation over left medial epicondyle.  She has some DIP thickening with tenderness over bilateral first DIP joints.  She has bilateral total knee replacement which causes discomfort with range of motion.  No calf tenderness was noted.  CDAI Exam: CDAI Homunculus Exam:   Joint Counts:  CDAI Tender Joint count: 0 CDAI Swollen Joint count: 0     Investigation: No additional findings. Labs: 08/2017 CBC and CMP normal, April 2018 TB Gold was negative.  Imaging: No results found.  Speciality Comments: No specialty comments available.    Procedures:  Medium Joint Inj (Medial epicondyle) on 01/17/2018 10:46 AM Indications: pain Details: 27 G 1.5 in needle, medial approach Medications: 1 mL lidocaine 1 %; 30 mg triamcinolone acetonide 40 MG/ML Aspirate: 0 mL Outcome: tolerated well, no immediate complications Procedure, treatment alternatives, risks and benefits explained, specific risks discussed. Consent was given by the patient. Immediately prior to  procedure a time out was called to verify the correct patient, procedure, equipment, support staff and site/side marked as required. Patient was prepped and draped in the usual sterile fashion.     Allergies: Patient has no known allergies.   Assessment / Plan:     Visit Diagnoses: Spondyloarthropathy ([place]) - MRI positive for sacroiliitis, HLA-B27 negative, elevated ESR.  She has been doing much better since she started doing Cosentyx.  Although she still continues to have some SI joint pain and occasional bursitis.  High risk medication use - Cosentyx 150 mg sq q month - Plan: CBC with Differential/Platelet, COMPLETE METABOLIC PANEL WITH GFR, QuantiFERON-TB Gold Plus today and then labs will be checked every 3 months.  Medial epicondylitis of left elbow-she has been having increased pain in the elbow.  She states is causing nocturnal pain.  After different treatment options were discussed we decided to injected with cortisone she tolerated the procedure well.  If she has an adequate response she should notify us.  I have also given her a handout on forearm exercises.  Arthropathy of lumbar facet joint-she has chronic pain.  She also have some SI joint discomfort.  DDD (degenerative disc disease), cervical-chronic pain  H/O total knee replacement, bilateral-she has discomfort in her bilateral knee joints.  Lung nodule seen on imaging study, CTA of chest, 6 mm nodule, followed by Dr. Virgina Jock  History of diabetes mellitus-to monitor blood pressure closely after cortisone injection.  History of diarrhea - Patient was evaluated by her gastroenterologist. According to patient she had C. difficile and she received vancomycin 2.   Chronic diastolic heart failure ([facility])  History of hyperlipidemia  History of TIA (transient ischemic attack) - 9/13  History of hypertension-she was well controlled today.  History of sleep apnea  History of obesity -weight loss diet and exercise was  discussed.   Orders: Orders Placed This Encounter  Procedures  . CBC with Differential/Platelet  . COMPLETE METABOLIC PANEL WITH GFR  . QuantiFERON-TB Gold Plus   Meds ordered this encounter  Medications  . diclofenac sodium (VOLTAREN) 1 % GEL    Sig: Apply three grams to three large joints up to three times daily    Dispense:  3 Tube    Refill:  3    Face-to-face time spent with patient was 30 minutes.> 50% of  time was spent in counseling and coordination of care.  Follow-Up Instructions: Return in about 5 months (around 06/19/2018) for Spondyloarthropathy, Osteoarthritis.   Bo Merino, MD  Note - This record has been created using Editor, commissioning.  Chart creation errors have been sought, but may not always  have been located. Such creation errors do not reflect on  the standard of medical care.

## 2018-01-17 ENCOUNTER — Ambulatory Visit: Payer: BLUE CROSS/BLUE SHIELD | Admitting: Rheumatology

## 2018-01-17 ENCOUNTER — Encounter: Payer: Self-pay | Admitting: Rheumatology

## 2018-01-17 VITALS — BP 126/79 | HR 87 | Resp 17 | Ht 62.0 in | Wt 280.5 lb

## 2018-01-17 DIAGNOSIS — Z8673 Personal history of transient ischemic attack (TIA), and cerebral infarction without residual deficits: Secondary | ICD-10-CM

## 2018-01-17 DIAGNOSIS — Z87898 Personal history of other specified conditions: Secondary | ICD-10-CM

## 2018-01-17 DIAGNOSIS — R911 Solitary pulmonary nodule: Secondary | ICD-10-CM | POA: Diagnosis not present

## 2018-01-17 DIAGNOSIS — M4690 Unspecified inflammatory spondylopathy, site unspecified: Secondary | ICD-10-CM | POA: Diagnosis not present

## 2018-01-17 DIAGNOSIS — I5032 Chronic diastolic (congestive) heart failure: Secondary | ICD-10-CM

## 2018-01-17 DIAGNOSIS — Z96653 Presence of artificial knee joint, bilateral: Secondary | ICD-10-CM

## 2018-01-17 DIAGNOSIS — Z79899 Other long term (current) drug therapy: Secondary | ICD-10-CM | POA: Diagnosis not present

## 2018-01-17 DIAGNOSIS — M47816 Spondylosis without myelopathy or radiculopathy, lumbar region: Secondary | ICD-10-CM

## 2018-01-17 DIAGNOSIS — M5030 Other cervical disc degeneration, unspecified cervical region: Secondary | ICD-10-CM

## 2018-01-17 DIAGNOSIS — M7702 Medial epicondylitis, left elbow: Secondary | ICD-10-CM | POA: Diagnosis not present

## 2018-01-17 DIAGNOSIS — Z8669 Personal history of other diseases of the nervous system and sense organs: Secondary | ICD-10-CM

## 2018-01-17 DIAGNOSIS — Z8679 Personal history of other diseases of the circulatory system: Secondary | ICD-10-CM | POA: Diagnosis not present

## 2018-01-17 DIAGNOSIS — M47819 Spondylosis without myelopathy or radiculopathy, site unspecified: Secondary | ICD-10-CM

## 2018-01-17 DIAGNOSIS — Z8639 Personal history of other endocrine, nutritional and metabolic disease: Secondary | ICD-10-CM

## 2018-01-17 MED ORDER — LIDOCAINE HCL 1 % IJ SOLN
1.0000 mL | INTRAMUSCULAR | Status: AC | PRN
  Administered 2018-01-17: 1 mL

## 2018-01-17 MED ORDER — TRIAMCINOLONE ACETONIDE 40 MG/ML IJ SUSP
30.0000 mg | INTRAMUSCULAR | Status: AC | PRN
  Administered 2018-01-17: 30 mg via INTRA_ARTICULAR

## 2018-01-17 MED ORDER — DICLOFENAC SODIUM 1 % TD GEL: TRANSDERMAL | 3 refills | Status: AC

## 2018-01-17 NOTE — Patient Instructions (Addendum)
Elbow and Forearm Exercises Ask your health care provider which exercises are safe for you. Do exercises exactly as told by your health care provider and adjust them as directed. It is normal to feel mild stretching, pulling, tightness, or discomfort as you do these exercises, but you should stop right away if you feel sudden pain or your pain gets worse.Do not begin these exercises until told by your health care provider. RANGE OF MOTION EXERCISES These exercises warm up your muscles and joints and improve the movement and flexibility of your injured elbow and forearm. These exercises also help to relieve pain, numbness, and tingling.These exercises are done using the muscles in your injured elbow and forearm. Exercise A: Elbow Flexion, Active 1. Hold your left / right arm at your side, and bend your elbow as far as you can using your left / right arm muscles. 2. Hold this position for __________ seconds. 3. Slowly return to the starting position. Repeat __________ times. Complete this exercise __________ times a day. Exercise B: Elbow Extension, Active 1. Hold your left / right arm at your side, and straighten your elbow as much as you can using your left / right arm muscles. 2. Hold this position for __________ seconds. 3. Slowly return to the starting position. Repeat __________ times. Complete this exercise __________ times a day. Exercise C: Forearm Rotation, Supination, Active 1. Stand or sit with your elbows at your sides. 2. Bend your left / right elbow to an "L" shape (90 degrees). 3. Turn your palm upward until you feel a gentle stretch on the inside of your forearm. 4. Hold this position for __________ seconds. 5. Slowly release and return to the starting position. Repeat __________ times. Complete this exercise __________ times a day. Exercise D: Forearm Rotation, Pronation, Active 1. Stand or sit with your elbows at your side. 2. Bend your left / right elbow to an "L" shape  (90 degrees). 3. Turn your left / right palm downward until you feel a gentle stretch on the top of your forearm. 4. Hold this position for __________ seconds. 5. Slowly release and return to the starting position. Repeat__________ times. Complete this exercise __________ times a day. STRETCHING EXERCISES These exercises warm up your muscles and joints and improve the movement and flexibility of your injured elbow and forearm. These exercises also help to relieve pain, numbness, and tingling.These exercises are done using your healthy elbow and forearm to help stretch the muscles in your injured elbow and forearm. Exercise E: Elbow Flexion, Active-Assisted  1. Hold your left / right arm at your side, and bend your elbow as much as you can using your left / right arm muscles. 2. Use your other hand to bend your left / right elbow farther. To do this, gently push up on your forearm until you feel a gentle stretch on the back of your elbow. 3. Hold this position for __________ seconds. 4. Slowly return to the starting position. Repeat __________ times. Complete this exercise __________ times a day. Exercise F: Elbow Extension, Active-Assisted  1. Hold your left / right arm at your side, and straighten your elbow as much as you can using your left / right arm muscles. 2. Use your other hand to straighten the left / right elbow farther. To do this, gently push down on your forearm until you feel a gentle stretch on the inside of your elbow. 3. Hold this position for __________ seconds. 4. Slowly return to the starting position. Repeat __________  times. Complete this exercise __________ times a day. Exercise G: Forearm Rotation, Supination, Active-Assisted  1. Sit with your left / right elbow bent in an "L" shape (90 degrees) with your forearm resting on a table. 2. Keeping your upper body and shoulder still, rotate your forearm so your left / right palm faces upward. 3. Use your other hand to  help rotate your forearm further until you feel a gentle to moderate stretch. 4. Hold this position for __________ seconds. 5. Slowly release the stretch and return to the starting position. Repeat __________ times. Complete this exercise __________ times a day. Exercise H: Forearm Rotation, Pronation, Active-Assisted  1. Sit with your left / right elbow bent in an "L" shape (90 degrees) with your forearm resting on a table. 2. Keeping your upper body and shoulder still, rotate your forearm so your palm faces the tabletop. 3. Use your other hand to help rotate your forearm further until you feel a gentle to moderate stretch. 4. Hold this position for __________ seconds. 5. Slowly release the stretch and return to the starting position. Repeat __________ times. Complete this exercise __________ times a day. Exercise I: Elbow Flexion, Supine, Passive 1. Lie on your back. 2. Extend your left / right arm up in the air, bracing it with your other hand. 3. Let your left / right your hand slowly lower toward your shoulder, while your elbow stays pointed toward the ceiling. You should feel a gentle stretch along the back of your upper arm and elbow. 4. If instructed by your health care provider, you may increase the intensity of your stretch by adding a small wrist weight or hand weight. 5. Hold this position for __________ seconds. 6. Slowly return to the starting position. Repeat __________ times. Complete this exercise __________ times a day. Exercise J: Elbow Extension, Supine, Passive  1. Lie on your back. Make sure that you are in a comfortable position that lets you relax your arm muscles. 2. Place a folded towel under your left / right upper arm so your elbow and shoulder are at the same height. Straighten your left / right arm so your elbow does not rest on the bed or towel. 3. Let the weight of your hand stretch your elbow. Keep your arm and chest muscles relaxed. You should feel a stretch  on the inside of your elbow. 4. If told by your health care provider, you may increase the intensity of your stretch by adding a small wrist weight or hand weight. 5. Hold this position for__________ seconds. 6. Slowly release the stretch. Repeat __________ times. Complete this exercise __________ times a day. STRENGTHENING EXERCISES These exercises build strength and endurance in your elbow and forearm. Endurance is the ability to use your muscles for a long time, even after they get tired. Exercise K: Elbow Flexion, Isometric  1. Stand or sit up straight. 2. Bend your left / right elbow in an "L" shape (90 degrees) and turn your palm up so your forearm is at the height of your waist. 3. Place your other hand on top of your forearm. Gently push down as your left / right arm resists. Push as hard as you can with both arms without causing any pain or movement at your left / right elbow. 4. Hold this position for __________ seconds. 5. Slowly release the tension in both arms. Let your muscles relax completely before repeating. Repeat __________ times. Complete this exercise __________ times a day. Exercise L: Elbow Extensors, Isometric  1. Stand or sit up straight. 2. Place your left / right arm so your palm faces your abdomen and it is at the height of your waist. 3. Place your other hand on the underside of your forearm. Gently push up as your left / right arm resists. Push as hard as you can with both arms, without causing any pain or movement at your left / right elbow. 4. Hold this position for __________ seconds. 5. Slowly release the tension in both arms. Let your muscles relax completely before repeating. Repeat __________ times. Complete this exercise __________ times a day. Exercise M: Elbow Flexion With Forearm Palm Up  1. Sit upright on a firm chair without armrests, or stand. 2. Place your left / right arm at your side with your palm facing forward. 3. Holding a  __________weight or gripping a rubber exercise band or tubing, bend your elbow to bring your hand toward your shoulder. 4. Hold this position for __________ seconds. 5. Slowly return to the starting position. Repeat __________times. Complete this exercise __________times a day. Exercise N: Elbow Extension  1. Sit on a firm chair without armrests, or stand. 2. Keeping your upper arms at your sides, bring both hands up toward your left / right shoulder while you grip a rubber exercise band or tubing. Your left / right hand should be just below the other hand. 3. Straighten your left / right elbow. 4. Hold this position for __________ seconds. 5. Control the resistance of the band or tubing as your hand returns to your side. Repeat __________times. Complete this exercise __________times a day. Exercise O: Forearm Rotation, Supination  1. Sit with your left / right forearm supported on a table. Keep your elbow at waist height. 2. Rest your hand over the edge of the table with your palm facing down. 3. Gently hold a lightweight hammer. 4. Without moving your elbow, slowly rotate your forearm to turn your palm and hand upward to a "thumbs-up" position. 5. Hold this position for __________ seconds. 6. Slowly return to the starting position. Repeat __________times. Complete this exercise __________times a day. Exercise P: Forearm Rotation, Pronation  1. Sit with your left / right forearm supported on a table. Keep your elbow below shoulder height. 2. Rest your hand over the edge of the table with your palm facing up. 3. Gently hold a lightweight hammer. 4. Without moving your elbow, slowly rotate your forearm to turn your palm and hand upward to a "thumbs-up" position. 5. Hold this position for __________seconds. 6. Slowly return to the starting position. Repeat __________times. Complete this exercise __________times a day. This information is not intended to replace advice given to you by your  health care provider. Make sure you discuss any questions you have with your health care provider. Document Released: 07/13/2005 Document Revised: 01/07/2016 Document Reviewed: 05/24/2015 Elsevier Interactive Patient Education  2018 [place] We placed an order today for your standing lab work.    Please come back and get your standing labs in August  and every 3 months  We have open lab Monday through Friday from 8:30-11:30 AM and 1:30-4:00 PM  at the office of Dr. Bo Merino.   You may experience shorter wait times on Monday and Friday afternoons. The office is located at [address] [place], [place], [place] [address] No appointment is necessary.   Labs are drawn by [facility].  You may receive a bill from [facility] for your lab work. If you have any  questions regarding directions or hours of operation,  please call [phone].

## 2018-01-18 NOTE — Progress Notes (Signed)
stable °

## 2018-01-20 LAB — COMPLETE METABOLIC PANEL WITH GFR
AG Ratio: 1.5 (calc) (ref 1.0–2.5)
ALT: 14 U/L (ref 6–29)
AST: 15 U/L (ref 10–35)
Albumin: 4.2 g/dL (ref 3.6–5.1)
Alkaline phosphatase (APISO): 122 U/L (ref 33–130)
BUN: 18 mg/dL (ref 7–25)
CO2: 25 mmol/L (ref 20–32)
Calcium: 9.7 mg/dL (ref 8.6–10.4)
Chloride: 107 mmol/L (ref 98–110)
Creat: 1 mg/dL (ref 0.50–1.05)
GFR, Est African American: 73 mL/min/{1.73_m2} (ref >=60)
GFR, Est Non African American: 63 mL/min/{1.73_m2} (ref >=60)
Globulin: 2.8 g/dL (calc) (ref 1.9–3.7)
Glucose, Bld: 90 mg/dL (ref 65–99)
Potassium: 4.6 mmol/L (ref 3.5–5.3)
Sodium: 140 mmol/L (ref 135–146)
Total Bilirubin: 0.2 mg/dL (ref 0.2–1.2)
Total Protein: 7 g/dL (ref 6.1–8.1)

## 2018-01-20 LAB — CBC WITH DIFFERENTIAL/PLATELET
Basophils Absolute: 27 cells/uL (ref 0–200)
Basophils Relative: 0.3 %
Eosinophils Absolute: 237 cells/uL (ref 15–500)
Eosinophils Relative: 2.6 %
HCT: 36.6 % (ref 35.0–45.0)
Hemoglobin: 11.8 g/dL (ref 11.7–15.5)
Lymphs Abs: 2011 cells/uL (ref 850–3900)
MCH: 25.6 pg — ABNORMAL LOW (ref 27.0–33.0)
MCHC: 32.2 g/dL (ref 32.0–36.0)
MCV: 79.4 fL — ABNORMAL LOW (ref 80.0–100.0)
MPV: 9.5 fL (ref 7.5–12.5)
Monocytes Relative: 7.7 %
Neutro Abs: 6124 cells/uL (ref 1500–7800)
Neutrophils Relative %: 67.3 %
Platelets: 247 10*3/uL (ref 140–400)
RBC: 4.61 10*6/uL (ref 3.80–5.10)
RDW: 14.6 % (ref 11.0–15.0)
Total Lymphocyte: 22.1 %
WBC mixed population: 701 cells/uL (ref 200–950)
WBC: 9.1 10*3/uL (ref 3.8–10.8)

## 2018-01-20 LAB — QUANTIFERON-TB GOLD PLUS
Mitogen-NIL: 5.84 IU/mL
NIL: 0.02 IU/mL
QuantiFERON-TB Gold Plus: NEGATIVE
TB1-NIL: 0.01 IU/mL
TB2-NIL: 0.02 IU/mL

## 2018-03-07 ENCOUNTER — Other Ambulatory Visit: Payer: Self-pay | Admitting: Internal Medicine

## 2018-03-07 DIAGNOSIS — Z1231 Encounter for screening mammogram for malignant neoplasm of breast: Secondary | ICD-10-CM

## 2018-03-12 ENCOUNTER — Other Ambulatory Visit: Payer: Self-pay | Admitting: Rheumatology

## 2018-03-12 NOTE — Telephone Encounter (Signed)
Last Visit: 01/17/18 Next Visit: 06/22/18 Labs: 01/17/18 Stable TB Gold: 01/17/18 Neg   Okay to refill per Dr. Estanislado Pandy

## 2018-04-12 ENCOUNTER — Other Ambulatory Visit: Payer: Self-pay

## 2018-04-12 DIAGNOSIS — Z79899 Other long term (current) drug therapy: Secondary | ICD-10-CM

## 2018-04-13 LAB — COMPLETE METABOLIC PANEL WITH GFR
AG Ratio: 1.3 (calc) (ref 1.0–2.5)
ALT: 10 U/L (ref 6–29)
AST: 10 U/L (ref 10–35)
Albumin: 4.2 g/dL (ref 3.6–5.1)
Alkaline phosphatase (APISO): 123 U/L (ref 33–130)
BUN: 16 mg/dL (ref 7–25)
CO2: 24 mmol/L (ref 20–32)
Calcium: 9.5 mg/dL (ref 8.6–10.4)
Chloride: 106 mmol/L (ref 98–110)
Creat: 0.86 mg/dL (ref 0.50–1.05)
GFR, Est African American: 88 mL/min/{1.73_m2} (ref >=60)
GFR, Est Non African American: 76 mL/min/{1.73_m2} (ref >=60)
Globulin: 3.2 g/dL (calc) (ref 1.9–3.7)
Glucose, Bld: 144 mg/dL — ABNORMAL HIGH (ref 65–99)
Potassium: 4.8 mmol/L (ref 3.5–5.3)
Sodium: 138 mmol/L (ref 135–146)
Total Bilirubin: 0.3 mg/dL (ref 0.2–1.2)
Total Protein: 7.4 g/dL (ref 6.1–8.1)

## 2018-04-13 LAB — CBC WITH DIFFERENTIAL/PLATELET
Basophils Absolute: 26 cells/uL (ref 0–200)
Basophils Relative: 0.2 %
Eosinophils Absolute: 39 cells/uL (ref 15–500)
Eosinophils Relative: 0.3 %
HCT: 38 % (ref 35.0–45.0)
Hemoglobin: 12.1 g/dL (ref 11.7–15.5)
Lymphs Abs: 1507 cells/uL (ref 850–3900)
MCH: 25.1 pg — ABNORMAL LOW (ref 27.0–33.0)
MCHC: 31.8 g/dL — ABNORMAL LOW (ref 32.0–36.0)
MCV: 78.7 fL — ABNORMAL LOW (ref 80.0–100.0)
MPV: 9 fL (ref 7.5–12.5)
Monocytes Relative: 4.8 %
Neutro Abs: 10899 cells/uL — ABNORMAL HIGH (ref 1500–7800)
Neutrophils Relative %: 83.2 %
Platelets: 267 10*3/uL (ref 140–400)
RBC: 4.83 10*6/uL (ref 3.80–5.10)
RDW: 15.1 % — ABNORMAL HIGH (ref 11.0–15.0)
Total Lymphocyte: 11.5 %
WBC mixed population: 629 cells/uL (ref 200–950)
WBC: 13.1 10*3/uL — ABNORMAL HIGH (ref 3.8–10.8)

## 2018-04-13 NOTE — Progress Notes (Signed)
Glucose is elevated probably not fasting.  White cell count is elevated.  Please ask patient if she has any infection or she had recent cortisone injection or prednisone taper. We will observe for now.

## 2018-04-20 ENCOUNTER — Ambulatory Visit
Admission: RE | Admit: 2018-04-20 | Discharge: 2018-04-20 | Disposition: A | Discharge status: Home / Self Care | Payer: BLUE CROSS/BLUE SHIELD | Source: Ambulatory Visit | Attending: Internal Medicine | Admitting: Internal Medicine

## 2018-04-20 DIAGNOSIS — Z1231 Encounter for screening mammogram for malignant neoplasm of breast: Secondary | ICD-10-CM

## 2018-06-08 NOTE — Progress Notes (Addendum)
Office Visit Note  Patient: [name]             Date of Birth: 14-Oct-1960           MRN: [id number]             PCP: Shon Baton, MD Referring: Shon Baton, MD Visit Date: 06/22/2018 Occupation: '@GUAROCC' @  Subjective:  Chronic lower back pain   History of Present Illness: [name] is a 57 y.o. female with history spondyloarthropathy.  She is on Cosentyx 150 mg  sq injections once monthly.  Patient denies any side effects or missing any doses recently.  She feels as though Cosentyx has been very effective for her.  She states that her insurance will be changing the first of the year and she is worried that it would no longer be covered.  She does not want to change medications at this time.  She states she continues to have chronic bilateral knee pain on the knee replacements.  She continues to follow-up with Dr. Para March on regular basis.  She takes meloxicam for pain relief.  She states that she has noticed decreased grip strength in bilateral hands but denies any pain or swelling at this time.  She continues to have chronic lower back pain.  She states that on average before taking her pain medications the pain is an 8 out of 10.  She states that her neck is doing well.  She states she occasionally has medial epicondylitis of the left elbow and performs her stretching exercises.  She also uses Voltaren gel topically.    Activities of Daily Living:  Patient reports morning stiffness for 30 minutes.   Patient Reports nocturnal pain.  Difficulty dressing/grooming: Denies Difficulty climbing stairs: Reports Difficulty getting out of chair: Reports Difficulty using hands for taps, buttons, cutlery, and/or writing: Reports  Review of Systems  Constitutional: Positive for fatigue.  HENT: Positive for mouth dryness. Negative for mouth sores and nose dryness.   Eyes: Negative for pain, visual disturbance and dryness.  Respiratory: Negative for cough, hemoptysis, shortness of  breath and difficulty breathing.   Cardiovascular: Positive for swelling in legs/feet. Negative for chest pain, palpitations and hypertension.  Gastrointestinal: Negative for blood in stool, constipation and diarrhea.  Endocrine: Negative for increased urination.  Genitourinary: Negative for difficulty urinating and painful urination.  Musculoskeletal: Positive for arthralgias, joint pain, joint swelling, morning stiffness and muscle tenderness. Negative for myalgias, muscle weakness and myalgias.  Skin: Negative for color change, pallor, rash, hair loss, nodules/bumps, skin tightness, ulcers and sensitivity to sunlight.  Allergic/Immunologic: Negative for susceptible to infections.  Neurological: Negative for dizziness, numbness and headaches.  Hematological: Negative for swollen glands.  Psychiatric/Behavioral: Positive for sleep disturbance. Negative for depressed mood. The patient is not nervous/anxious.     PMFS History:  Patient Active Problem List   Diagnosis Date Noted  . Spondyloarthropathy 07/26/2016  . High risk medication use 07/26/2016  . Chronic low back pain 07/26/2016  . H/O total knee replacement, bilateral 07/26/2016  . Chronic diastolic heart failure ([place]) 11/29/2015  . Dyspnea 06/03/2015  . Lung nodule seen on imaging study, CTA of chest, 6 mm nodule, followed by Dr. Virgina Jock 03/25/2013  . Family history of coronary artery disease 03/12/2013  . Morbid obesity ([place]) 03/12/2013  . Chest pain with low risk of acute coronary syndrome - most likely musculoskeletal, negative nuc study 03/11/2013  . Full thickness rotator cuff tear 05/15/2012  . Hyperlipidemia   . DJD (  degenerative joint disease), cervical   . Sleep apnea   . Hypertension   . History of TIA (transient ischemic attack) 9/13   . Partial tear of rotator cuff(726.13)   . Shoulder impingement 04/12/2012    Past Medical History:  Diagnosis Date  . Anemia    takes iron supplement  . Anxiety   . Asthma     daily and prn inhalers  . Bone spur    Left foot  . Chest pain    a. 10/2015: NST showing possible mild ischemia but most consistent with breast attenuation. Low-risk study.   . Chronic diastolic CHF (congestive heart failure) ([place])   . DDD (degenerative disc disease), cervical   . Degenerative disc disease   . Diabetes mellitus ([place])   . Essential hypertension   . Family history of early CAD   . Former tobacco use   . GERD (gastroesophageal reflux disease)   . GERD (gastroesophageal reflux disease)   . Headache(784.0)    migraine-like  . History of TIA (transient ischemic attack) early 2013   no weakness or deficits  . History of UTI   . Hyperlipidemia   . Insomnia   . Migraines   . Morbid obesity ([place])   . Partial tear of rotator cuff(726.13)   . Psoriatic arthritis ([place])   . Shoulder impingement 04/2012   left  . Sleep apnea    uses CPAP nightly    Family History  Problem Relation Age of Onset  . Diabetes Mother   . Hypertension Mother   . Kidney disease Father   . Hypertension Father   . Diabetes Father   . Breast cancer Sister   . Diabetes Sister   . Breast cancer Sister   . Cancer Maternal Grandmother   . Diabetes Maternal Grandmother   . Hypertension Maternal Grandmother   . Diabetes Paternal Grandfather   . Cancer Paternal Grandfather   . Coronary artery disease Sister   . Diabetes Sister    Past Surgical History:  Procedure Laterality Date  . ABDOMINAL HYSTERECTOMY  03/28/2002   complete  . ANTERIOR CERVICAL DECOMP/DISCECTOMY FUSION  02/18/2010   C6-7  . BREAST BIOPSY Right   . DILATION AND CURETTAGE OF UTERUS     2000  . FOOT SURGERY Right 2015  . ROTATOR CUFF REPAIR Left 2013  . TOTAL KNEE ARTHROPLASTY  09/07/2009   left  . TOTAL KNEE ARTHROPLASTY  10/13/2008   right  . TOTAL SHOULDER ARTHROPLASTY    . UMBILICAL HERNIA REPAIR  03/28/2002   Social History   Social History Narrative  . Not on file    Objective: Vital Signs: BP 123/71 (BP  Location: Left Arm, Patient Position: Sitting, Cuff Size: Normal)   Pulse 88   Resp 16   Ht '5\' 2"'  (1.575 m)   Wt 278 lb 9.6 oz (126.4 kg)   BMI 50.96 kg/m    Physical Exam  Constitutional: She is oriented to person, place, and time. She appears well-developed and well-nourished.  HENT:  Head: Normocephalic and atraumatic.  Eyes: Conjunctivae and EOM are normal.  Neck: Normal range of motion.  Cardiovascular: Normal rate, regular rhythm, normal heart sounds and intact distal pulses.  Pulmonary/Chest: Effort normal and breath sounds normal.  Abdominal: Soft. Bowel sounds are normal.  Lymphadenopathy:    She has no cervical adenopathy.  Neurological: She is alert and oriented to person, place, and time.  Skin: Skin is warm and dry. Capillary refill takes less than 2 seconds.  Psychiatric: She has a normal mood and affect. Her behavior is normal.  Nursing note and vitals reviewed.    Musculoskeletal Exam: C-spine good range of motion with no discomfort.  Thoracic and lumbar spine slightly limited range of motion with discomfort.  Midline spinal tenderness in the lumbar region.  Mild SI joint tenderness bilaterally.  Shoulder joints, elbow joints, wrist joints, MCPs and PIPs and DIPs good range of motion with no synovitis.  She is complete fist formation bilaterally.  Hip joints good range of motion with no discomfort.  Good range of motion bilateral knee replacements.  No warmth or effusion noted.  No tenderness or swelling of ankle joints.  No Achilles tendinitis.  She has mild tenderness of plantar fascial bilaterally.  No tenderness of MTP or PIP joints.  CDAI Exam: CDAI Score: Not documented Patient Global Assessment: Not documented; Provider Global Assessment: Not documented Swollen: Not documented; Tender: Not documented Joint Exam   Not documented   There is currently no information documented on the homunculus. Go to the Rheumatology activity and complete the homunculus joint  exam.  Investigation: No additional findings.  Imaging: No results found.  Recent Labs: Lab Results  Component Value Date   WBC 13.1 (H) 04/12/2018   HGB 12.1 04/12/2018   PLT 267 04/12/2018   NA 138 04/12/2018   K 4.8 04/12/2018   CL 106 04/12/2018   CO2 24 04/12/2018   GLUCOSE 144 (H) 04/12/2018   BUN 16 04/12/2018   CREATININE 0.86 04/12/2018   BILITOT 0.3 04/12/2018   ALKPHOS 122 03/01/2017   AST 10 04/12/2018   ALT 10 04/12/2018   PROT 7.4 04/12/2018   ALBUMIN 4.2 03/01/2017   CALCIUM 9.5 04/12/2018   GFRAA 88 04/12/2018   QFTBGOLDPLUS NEGATIVE 01/17/2018    Speciality Comments: No specialty comments available.  Procedures:  No procedures performed Allergies: Patient has no known allergies.   Assessment / Plan:     Visit Diagnoses: Spondyloarthropathy: MRI positive for sacroiliitis, HLA-B27 negative, elevated ESR: She continues to have chronic lower back pain and bilateral SI joint tenderness but continues to notice significant improvement while on Cosentyx 150 mg sq injections once monthly.  She is clinically doing well on Cosentyx 150 mg subcutaneous injections once monthly.  She has not had any recent flares.  She has no synovitis on exam.  She has mild tenderness of bilateral plantar fascia but no Achilles tendinitis.  She feels as though Cosentyx has been very effective and would like to continue on this current treatment.  She was advised to notify us if she develops any new or worsening symptoms.  She will follow-up in the office in 5 months.  High risk medication use - Cosentyx 150 mg sq q month.  CBC and CMP were checked on 04/12/2018.  She will return for lab work in November and every 3 months to monitor for drug toxicity.  TB gold was negative on 01/17/2018.  Medial epicondylitis of left elbow: She has intermittent tenderness.  She perform stretching exercises on a regular basis.  She also applies Voltaren gel topically.  Arthropathy of lumbar facet joint:  Chronic pain.  She has midline spinal tenderness and limited range of motion with discomfort.  DDD (degenerative disc disease), cervical: She is good range of motion with no discomfort.  No symptoms of radiculopathy at this time.  H/O total knee replacement, bilateral: She is good range of motion on exam.  No warmth or effusion noted.  She continues to discomfort  in bilateral knee joints.  She notices intermittent swelling.  She continues to follow-up with Dr. Para March.  She applies Voltaren gel topically as well as taking meloxicam as needed.  Other medical conditions are listed as follows:  Lung nodule seen on imaging study, CTA of chest, 6 mm nodule, followed by Dr. Virgina Jock  History of diabetes mellitus  Chronic diastolic heart failure ([place])  History of hyperlipidemia  History of TIA (transient ischemic attack)  History of hypertension  History of sleep apnea   Orders: No orders of the defined types were placed in this encounter.  No orders of the defined types were placed in this encounter.     Follow-Up Instructions: Return in about 5 months (around 11/21/2018) for Spondyloarthropathy, DDD.   Ofilia Neas, PA-C  Note - This record has been created using Dragon software.  Chart creation errors have been sought, but may not always  have been located. Such creation errors do not reflect on  the standard of medical care.

## 2018-06-11 ENCOUNTER — Other Ambulatory Visit: Payer: Self-pay | Admitting: Rheumatology

## 2018-06-11 NOTE — Telephone Encounter (Signed)
Last Visit: 01/17/18 Next Visit: 06/22/18 Labs: 04/12/18 Glucose is elevated. White cell count is elevated TB Gold: 01/17/18 Neg   Okay to refill per Dr. Estanislado Pandy

## 2018-06-22 ENCOUNTER — Encounter: Payer: Self-pay | Admitting: Physician Assistant

## 2018-06-22 ENCOUNTER — Telehealth: Payer: Self-pay | Admitting: Pharmacy Technician

## 2018-06-22 ENCOUNTER — Ambulatory Visit (INDEPENDENT_AMBULATORY_CARE_PROVIDER_SITE_OTHER): Payer: BLUE CROSS/BLUE SHIELD | Admitting: Physician Assistant

## 2018-06-22 VITALS — BP 123/71 | HR 88 | Resp 16 | Ht 62.0 in | Wt 278.6 lb

## 2018-06-22 DIAGNOSIS — Z96653 Presence of artificial knee joint, bilateral: Secondary | ICD-10-CM

## 2018-06-22 DIAGNOSIS — Z8679 Personal history of other diseases of the circulatory system: Secondary | ICD-10-CM

## 2018-06-22 DIAGNOSIS — M47816 Spondylosis without myelopathy or radiculopathy, lumbar region: Secondary | ICD-10-CM

## 2018-06-22 DIAGNOSIS — R911 Solitary pulmonary nodule: Secondary | ICD-10-CM

## 2018-06-22 DIAGNOSIS — Z8673 Personal history of transient ischemic attack (TIA), and cerebral infarction without residual deficits: Secondary | ICD-10-CM

## 2018-06-22 DIAGNOSIS — M5030 Other cervical disc degeneration, unspecified cervical region: Secondary | ICD-10-CM

## 2018-06-22 DIAGNOSIS — Z8669 Personal history of other diseases of the nervous system and sense organs: Secondary | ICD-10-CM

## 2018-06-22 DIAGNOSIS — M7702 Medial epicondylitis, left elbow: Secondary | ICD-10-CM | POA: Diagnosis not present

## 2018-06-22 DIAGNOSIS — I5032 Chronic diastolic (congestive) heart failure: Secondary | ICD-10-CM

## 2018-06-22 DIAGNOSIS — M47819 Spondylosis without myelopathy or radiculopathy, site unspecified: Secondary | ICD-10-CM | POA: Diagnosis not present

## 2018-06-22 DIAGNOSIS — Z79899 Other long term (current) drug therapy: Secondary | ICD-10-CM

## 2018-06-22 DIAGNOSIS — Z8639 Personal history of other endocrine, nutritional and metabolic disease: Secondary | ICD-10-CM

## 2018-06-22 NOTE — Telephone Encounter (Signed)
Spoke to patient during her visit today, her insurance will be switching to [employer] at the 1st of the year. And they have already informed her that the Cosentyx will not be covered. Discussed patient assistance options for Medicare patient. Patient took home an application for [employer] PAP.  8:33 AM [name], CPhT

## 2018-06-22 NOTE — Patient Instructions (Signed)
Standing Labs We placed an order today for your standing lab work.    Please come back and get your standing labs in November and every 3 months  We have open lab Monday through Friday from 8:30-11:30 AM and 1:30-4:00 PM  at the office of Dr. Shaili Deveshwar.   You may experience shorter wait times on Monday and Friday afternoons. The office is located at [address], [address], [place], [place] [address] No appointment is necessary.   Labs are drawn by [facility].  You may receive a bill from [facility] for your lab work. If you have any questions regarding directions or hours of operation,  please call [phone].   Just as a reminder please drink plenty of water prior to coming for your lab work. Thanks!  

## 2018-06-27 ENCOUNTER — Other Ambulatory Visit: Payer: Self-pay

## 2018-06-27 ENCOUNTER — Emergency Department (HOSPITAL_COMMUNITY): Payer: BLUE CROSS/BLUE SHIELD

## 2018-06-27 ENCOUNTER — Emergency Department (HOSPITAL_COMMUNITY)
Admission: EM | Admit: 2018-06-27 | Discharge: 2018-06-27 | Disposition: A | Discharge status: Home / Self Care | Payer: BLUE CROSS/BLUE SHIELD | Attending: Emergency Medicine | Admitting: Emergency Medicine

## 2018-06-27 ENCOUNTER — Encounter (HOSPITAL_COMMUNITY): Payer: Self-pay | Admitting: *Deleted

## 2018-06-27 DIAGNOSIS — R531 Weakness: Secondary | ICD-10-CM | POA: Diagnosis present

## 2018-06-27 DIAGNOSIS — Z87891 Personal history of nicotine dependence: Secondary | ICD-10-CM | POA: Diagnosis not present

## 2018-06-27 DIAGNOSIS — I110 Hypertensive heart disease with heart failure: Secondary | ICD-10-CM | POA: Insufficient documentation

## 2018-06-27 DIAGNOSIS — Z96653 Presence of artificial knee joint, bilateral: Secondary | ICD-10-CM | POA: Diagnosis not present

## 2018-06-27 DIAGNOSIS — I5032 Chronic diastolic (congestive) heart failure: Secondary | ICD-10-CM | POA: Insufficient documentation

## 2018-06-27 DIAGNOSIS — R2681 Unsteadiness on feet: Secondary | ICD-10-CM | POA: Diagnosis not present

## 2018-06-27 DIAGNOSIS — Z7984 Long term (current) use of oral hypoglycemic drugs: Secondary | ICD-10-CM | POA: Insufficient documentation

## 2018-06-27 DIAGNOSIS — E119 Type 2 diabetes mellitus without complications: Secondary | ICD-10-CM | POA: Insufficient documentation

## 2018-06-27 DIAGNOSIS — Z79899 Other long term (current) drug therapy: Secondary | ICD-10-CM | POA: Insufficient documentation

## 2018-06-27 DIAGNOSIS — Z96619 Presence of unspecified artificial shoulder joint: Secondary | ICD-10-CM | POA: Diagnosis not present

## 2018-06-27 DIAGNOSIS — J45909 Unspecified asthma, uncomplicated: Secondary | ICD-10-CM | POA: Diagnosis not present

## 2018-06-27 DIAGNOSIS — Z7982 Long term (current) use of aspirin: Secondary | ICD-10-CM | POA: Diagnosis not present

## 2018-06-27 LAB — COMPREHENSIVE METABOLIC PANEL
ALT: 15 U/L (ref 0–44)
AST: 18 U/L (ref 15–41)
Albumin: 3.6 g/dL (ref 3.5–5.0)
Alkaline Phosphatase: 119 U/L (ref 38–126)
Anion gap: 8 (ref 5–15)
BUN: 10 mg/dL (ref 6–20)
CO2: 22 mmol/L (ref 22–32)
Calcium: 9.3 mg/dL (ref 8.9–10.3)
Chloride: 109 mmol/L (ref 98–111)
Creatinine, Ser: 0.97 mg/dL (ref 0.44–1.00)
GFR calc Af Amer: >60 mL/min (ref >60)
GFR calc non Af Amer: >60 mL/min (ref >60)
Glucose, Bld: 123 mg/dL — ABNORMAL HIGH (ref 70–99)
Potassium: 3.9 mmol/L (ref 3.5–5.1)
Sodium: 139 mmol/L (ref 135–145)
Total Bilirubin: 0.6 mg/dL (ref 0.3–1.2)
Total Protein: 7.3 g/dL (ref 6.5–8.1)

## 2018-06-27 LAB — PROTIME-INR
INR: 1.04
Prothrombin Time: 13.5 seconds (ref 11.4–15.2)

## 2018-06-27 LAB — I-STAT CHEM 8, ED
BUN: 12 mg/dL (ref 6–20)
Calcium, Ion: 1.09 mmol/L — ABNORMAL LOW (ref 1.15–1.40)
Chloride: 109 mmol/L (ref 98–111)
Creatinine, Ser: 0.9 mg/dL (ref 0.44–1.00)
Glucose, Bld: 120 mg/dL — ABNORMAL HIGH (ref 70–99)
HCT: 37 % (ref 36.0–46.0)
Hemoglobin: 12.6 g/dL (ref 12.0–15.0)
Potassium: 4 mmol/L (ref 3.5–5.1)
Sodium: 139 mmol/L (ref 135–145)
TCO2: 22 mmol/L (ref 22–32)

## 2018-06-27 LAB — I-STAT TROPONIN, ED: Troponin i, poc: 0 ng/mL (ref 0.00–0.08)

## 2018-06-27 LAB — DIFFERENTIAL
Abs Immature Granulocytes: 0.03 10*3/uL (ref 0.00–0.07)
Basophils Absolute: 0 10*3/uL (ref 0.0–0.1)
Basophils Relative: 0 %
Eosinophils Absolute: 0.2 10*3/uL (ref 0.0–0.5)
Eosinophils Relative: 3 %
Immature Granulocytes: 0 %
Lymphocytes Relative: 24 %
Lymphs Abs: 1.9 10*3/uL (ref 0.7–4.0)
Monocytes Absolute: 0.6 10*3/uL (ref 0.1–1.0)
Monocytes Relative: 7 %
Neutro Abs: 5.3 10*3/uL (ref 1.7–7.7)
Neutrophils Relative %: 66 %

## 2018-06-27 LAB — CBC
HCT: 40.1 % (ref 36.0–46.0)
Hemoglobin: 11.5 g/dL — ABNORMAL LOW (ref 12.0–15.0)
MCH: 24.7 pg — ABNORMAL LOW (ref 26.0–34.0)
MCHC: 28.7 g/dL — ABNORMAL LOW (ref 30.0–36.0)
MCV: 86.2 fL (ref 80.0–100.0)
Platelets: 256 10*3/uL (ref 150–400)
RBC: 4.65 MIL/uL (ref 3.87–5.11)
RDW: 15.9 % — ABNORMAL HIGH (ref 11.5–15.5)
WBC: 8 10*3/uL (ref 4.0–10.5)
nRBC: 0 % (ref 0.0–0.2)

## 2018-06-27 LAB — APTT: aPTT: 32 seconds (ref 24–36)

## 2018-06-27 NOTE — ED Provider Notes (Signed)
[facility] EMERGENCY DEPARTMENT Provider Note   CSN: [id number] Arrival date & time: 06/27/18  1156     History   Chief Complaint Chief Complaint  Patient presents with  . Weakness    HPI [name] is a 57 y.o. female.  HPI   [name] is a 57 y.o. female, with a history of anemia, asthma, anxiety, DM, HTN, and morbid obesity, presenting to the ED with "feeling abnormal" beginning yesterday around 11 AM.  States she was driving and felt as though she was drifting towards the left.  She states she was not actually drifting towards the left because she was staying within the lines.  She also felt some unsteadiness on her feet yesterday.  She feels much better today, however, she states her daughter works in a medical office, spoke to one of the physicians, who recommended patient come to the ED  Denies syncope, confusion, falls/trauma, vision loss, unilateral weakness, numbness, chest pain, shortness of breath, abdominal pain, or any other complaints.    Past Medical History:  Diagnosis Date  . Anemia    takes iron supplement  . Anxiety   . Asthma    daily and prn inhalers  . Bone spur    Left foot  . Chest pain    a. 10/2015: NST showing possible mild ischemia but most consistent with breast attenuation. Low-risk study.   . Chronic diastolic CHF (congestive heart failure) ([place])   . DDD (degenerative disc disease), cervical   . Degenerative disc disease   . Diabetes mellitus ([place])   . Essential hypertension   . Family history of early CAD   . Former tobacco use   . GERD (gastroesophageal reflux disease)   . GERD (gastroesophageal reflux disease)   . Headache(784.0)    migraine-like  . History of TIA (transient ischemic attack) early 2013   no weakness or deficits  . History of UTI   . Hyperlipidemia   . Insomnia   . Migraines   . Morbid obesity ([place])   . Partial tear of rotator cuff(726.13)   . Psoriatic arthritis ([place])   .  Shoulder impingement 04/2012   left  . Sleep apnea    uses CPAP nightly    Patient Active Problem List   Diagnosis Date Noted  . Spondyloarthropathy 07/26/2016  . High risk medication use 07/26/2016  . Chronic low back pain 07/26/2016  . H/O total knee replacement, bilateral 07/26/2016  . Chronic diastolic heart failure ([place]) 11/29/2015  . Dyspnea 06/03/2015  . Lung nodule seen on imaging study, CTA of chest, 6 mm nodule, followed by Dr. Virgina Jock 03/25/2013  . Family history of coronary artery disease 03/12/2013  . Morbid obesity ([place]) 03/12/2013  . Chest pain with low risk of acute coronary syndrome - most likely musculoskeletal, negative nuc study 03/11/2013  . Full thickness rotator cuff tear 05/15/2012  . Hyperlipidemia   . DJD (degenerative joint disease), cervical   . Sleep apnea   . Hypertension   . History of TIA (transient ischemic attack) 9/13   . Partial tear of rotator cuff(726.13)   . Shoulder impingement 04/12/2012    Past Surgical History:  Procedure Laterality Date  . ABDOMINAL HYSTERECTOMY  03/28/2002   complete  . ANTERIOR CERVICAL DECOMP/DISCECTOMY FUSION  02/18/2010   C6-7  . BREAST BIOPSY Right   . DILATION AND CURETTAGE OF UTERUS     2000  . FOOT SURGERY Right 2015  . ROTATOR CUFF REPAIR Left  2013  . TOTAL KNEE ARTHROPLASTY  09/07/2009   left  . TOTAL KNEE ARTHROPLASTY  10/13/2008   right  . TOTAL SHOULDER ARTHROPLASTY    . UMBILICAL HERNIA REPAIR  03/28/2002     OB History   None      Home Medications    Prior to Admission medications   Medication Sig Start Date End Date Taking? Authorizing Provider  acidophilus (RISAQUAD) CAPS Take 1 capsule by mouth every morning.    Yes [provider]  aspirin 81 MG tablet Take 81 mg by mouth daily as needed for pain.   Yes [provider]  atorvastatin (LIPITOR) 20 MG tablet Take 20 mg by mouth every evening.    Yes [provider]  azelastine (ASTELIN) 0.1 % nasal spray Place 1  spray into both nostrils as needed for rhinitis. Use in each nostril as directed   Yes [provider]  baclofen (LIORESAL) 10 MG tablet Take 10-20 mg by mouth See admin instructions. Weekly as needed   Yes [provider]  beclomethasone (QVAR) 40 MCG/ACT inhaler Inhale 2 puffs into the lungs as needed.    Yes [provider]  Calcium Carbonate Antacid (TUMS E-X PO) Take 1 tablet by mouth daily at 12 noon. Take 1 tablet at bedtime   Yes [provider]  chlorproMAZINE (THORAZINE) 25 MG tablet Take 25-50 mg by mouth See admin instructions. every 3 hours as needed max twice month 06/08/17  Yes [provider]  COSENTYX SENSOREADY PEN 150 MG/ML SOAJ INJECT ONE PEN SUBCUTANEOUSLY EVERY 4 WEEKS. REFRIGERATE. ALLOW 15 TO30 MINUTES AT ROOM TEMP PRIOR TO ADMINISTRATION. Patient taking differently: Inject 150 mg as directed every 30 (thirty) days.  06/11/18  Yes Deveshwar, Abel Presto, MD  cyclobenzaprine (FLEXERIL) 10 MG tablet Take 10 mg by mouth 2 (two) times daily as needed for muscle spasms.   Yes [provider]  diclofenac sodium (VOLTAREN) 1 % GEL Apply three grams to three large joints up to three times daily Patient taking differently: Apply 2 g topically as needed. Apply to Knee, fingers, elbow and left shoulder 01/17/18  Yes Ofilia Neas, PA-C  DULoxetine (CYMBALTA) 60 MG capsule Take 60 mg by mouth daily.  10/10/16  Yes [provider]  ferrous sulfate 325 (65 FE) MG tablet Take 325 mg by mouth 4 (four) times a week. Sunday, Tuesday, Thursday, and saturday   Yes [provider]  furosemide (LASIX) 40 MG tablet TAKE 1 TABLET BY MOUTH DAILY Patient taking differently: Take 40 mg by mouth daily. Take additional 20 mg for three days to get fluid is reduced 08/28/17  Yes Croitoru, Mihai, MD  HYDROcodone-acetaminophen (NORCO/VICODIN) 5-325 MG per tablet Take 1 tablet by mouth as needed for moderate pain.    Yes [provider]    hydrOXYzine (ATARAX/VISTARIL) 25 MG tablet Take 25 mg by mouth every 8 (eight) hours as needed. For itching.   Yes [provider]  irbesartan (AVAPRO) 150 MG tablet Take 150 mg by mouth daily. If Blood pressure is below 110 take 75 mg  If Blood pressure is above 110 take 150 mg If blood sugar is 102 and below do not take blood pressure medication 04/10/17  Yes [provider]  loratadine (CLARITIN) 10 MG tablet Take 10 mg by mouth daily.    Yes [provider]  LORazepam (ATIVAN) 0.5 MG tablet Take 0.5 mg by mouth as needed for anxiety.    Yes [provider]  meloxicam (MOBIC) 15 MG tablet Take 15 mg by mouth daily.  06/07/18  Yes [provider]  metFORMIN (GLUCOPHAGE) 500 MG tablet Take 500 mg by mouth daily with breakfast.  04/08/14  Yes [provider]  methocarbamol (ROBAXIN) 500 MG tablet Take 500 mg by mouth every 8 (eight) hours as needed for muscle spasms.  04/04/18  Yes [provider]  Multiple Vitamin (MULITIVITAMIN WITH MINERALS) TABS Take 1 tablet by mouth daily.   Yes [provider]  mupirocin nasal ointment (BACTROBAN) 2 % Place 1 application into the nose 2 (two) times daily. Use one-half of tube in each . After application, press sides of nose together and gently massage.   Yes [provider]  nitroGLYCERIN (NITRO-DUR) 0.2 mg/hr patch Place 1 patch (0.2 mg total) onto the skin daily. Patient taking differently: Place 0.2 mg onto the skin as needed (pain).  12/13/16  Yes Suzan Slick, NP  NON FORMULARY at bedtime. CPAP   Yes [provider]  Olopatadine HCl (PATADAY OP) Place 1 drop into both eyes daily at 12 noon.    Yes [provider]  omeprazole (PRILOSEC) 20 MG capsule Take 20 mg by mouth 2 (two) times daily.    Yes Denita Lung, MD  potassium chloride SA (K-DUR,KLOR-CON) 20 MEQ tablet Take 40 mEq by mouth 2 (two) times daily.  10/04/16  Yes [provider]  PROAIR  HFA 108 (90 Base) MCG/ACT inhaler Inhale 2 puffs into the lungs as needed for shortness of breath.  06/09/17  Yes [provider]  QUDEXY XR CS24 sprinkle capsule Take 300 mg by mouth at bedtime.  06/05/18  Yes [provider]  QVAR REDIHALER 40 MCG/ACT inhaler Inhale 2 puffs into the lungs daily at 12 noon.  06/14/18  Yes [provider]  vitamin B-12 (CYANOCOBALAMIN) 500 MCG tablet Take 500 mcg by mouth daily.    Yes [provider]  zolpidem (AMBIEN) 10 MG tablet Take 10 mg by mouth at bedtime as needed for sleep.   Yes [provider]  amoxicillin (AMOXIL) 500 MG capsule Take 500 mg by mouth as needed. Before dental procedures    [provider]  Blood Glucose Monitoring Suppl (CONTOUR NEXT EZ) w/Device KIT by Does not apply route.    [provider]  ONE TOUCH ULTRA TEST test strip  04/29/14   [provider]  Jonetta Speak LANCETS [address]E [place]  04/29/14   [provider]    Family History Family History  Problem Relation Age of Onset  . Diabetes Mother   . Hypertension Mother   . Kidney disease Father   . Hypertension Father   . Diabetes Father   . Breast cancer Sister   . Diabetes Sister   . Breast cancer Sister   . Cancer Maternal Grandmother   . Diabetes Maternal Grandmother   . Hypertension Maternal Grandmother   . Diabetes Paternal Grandfather   . Cancer Paternal Grandfather   . Coronary artery disease Sister   . Diabetes Sister     Social History Social History   Tobacco Use  . Smoking status: Former Smoker    Packs/day: 0.50    Years: 20.00    Pack years: 10.00    Types: Cigarettes    Last attempt to quit: 10/02/1999    Years since quitting: 18.7  . Smokeless tobacco: Never Used  Substance Use Topics  . Alcohol use: No  . Drug use: No  Allergies   Patient has no known allergies.   Review of Systems Review of Systems  Constitutional: Negative for chills, diaphoresis and  fever.  Eyes: Negative for visual disturbance.  Respiratory: Negative for shortness of breath.   Cardiovascular: Negative for chest pain.  Gastrointestinal: Negative for abdominal pain, diarrhea, nausea and vomiting.  Musculoskeletal: Negative for back pain and neck pain.  Neurological: Negative for dizziness, syncope, facial asymmetry, speech difficulty, weakness, light-headedness, numbness and headaches.       Intermittent "unsteadiness."  All other systems reviewed and are negative.    Physical Exam Updated Vital Signs BP 137/84 (BP Location: Right Arm)   Pulse (!) 105   Temp 98 F (36.7 C) (Oral)   Resp 20   SpO2 100%   Physical Exam  Constitutional: She is oriented to person, place, and time. She appears well-developed and well-nourished. No distress.  HENT:  Head: Normocephalic and atraumatic.  Eyes: Pupils are equal, round, and reactive to light. Conjunctivae and EOM are normal.  Neck: Neck supple.  Cardiovascular: Normal rate, regular rhythm, normal heart sounds and intact distal pulses.  Pulmonary/Chest: Effort normal and breath sounds normal. No respiratory distress.  Abdominal: Soft. There is no tenderness. There is no guarding.  Musculoskeletal: She exhibits no edema.  Lymphadenopathy:    She has no cervical adenopathy.  Neurological: She is alert and oriented to person, place, and time.  Sensation grossly intact to light touch in the extremities. No noted speech deficits. No aphasia. Patient handles oral secretions without difficulty. No noted swallowing defects.  Equal grip strength bilaterally. Strength 5/5 in the upper extremities. Strength 5/5 in the lower extremities. Negative Romberg. No gait disturbance.  Coordination intact including heel to shin and finger to nose.  Cranial nerves III-XII grossly intact.  No facial droop.   Skin: Skin is warm and dry. She is not diaphoretic.  Psychiatric: Her behavior is normal. Her mood appears anxious.  Nursing  note and vitals reviewed.    ED Treatments / Results  Labs (all labs ordered are listed, but only abnormal results are displayed) Labs Reviewed  CBC - Abnormal; Notable for the following components:      Result Value   Hemoglobin 11.5 (*)    MCH 24.7 (*)    MCHC 28.7 (*)    RDW 15.9 (*)    All other components within normal limits  COMPREHENSIVE METABOLIC PANEL - Abnormal; Notable for the following components:   Glucose, Bld 123 (*)    All other components within normal limits  I-STAT CHEM 8, ED - Abnormal; Notable for the following components:   Glucose, Bld 120 (*)    Calcium, Ion 1.09 (*)    All other components within normal limits  PROTIME-INR  APTT  DIFFERENTIAL  I-STAT TROPONIN, ED    EKG EKG Interpretation  Date/Time:  Wednesday June 27 2018 12:10:46 EDT Ventricular Rate:  100 PR Interval:  136 QRS Duration: 96 QT Interval:  356 QTC Calculation: 459 R Axis:   24 Text Interpretation:  Normal sinus rhythm Normal ECG Confirmed by Julianne Rice [phone]) on 06/27/2018 3:36:38 PM   Radiology Ct Head Wo Contrast  Result Date: 06/27/2018 CLINICAL DATA:  57 year old female with dizziness, ataxia, "head feels cloudy". EXAM: CT HEAD WITHOUT CONTRAST TECHNIQUE: Contiguous axial images were obtained from the base of the skull through the vertex without intravenous contrast. COMPARISON:  Orbit CT 09/21/2012. FINDINGS: Brain: No midline shift, ventriculomegaly, mass effect, evidence of mass lesion, intracranial hemorrhage or evidence of  cortically based acute infarction. Gray-white matter differentiation is within normal limits throughout the brain. Small cavum septum pellucidum, normal variant. Vascular: Calcified atherosclerosis at the skull base. No suspicious intracranial vascular hyperdensity. Skull: Negative. Sinuses/Orbits: Increased mucosal thickening in the left sphenoid sinus. Other Visualized paranasal sinuses and mastoids are stable and well pneumatized.  Tympanic cavities are clear. Other: Visualized orbits and scalp soft tissues are within normal limits. IMPRESSION: 1. Negative for age noncontrast CT appearance of the brain. 2. Chronic but increased mucosal disease in the left sphenoid sinus. Electronically Signed   By: Genevie Ann [name]   On: 06/27/2018 13:29    Procedures Procedures (including critical care time)  Medications Ordered in ED Medications - No data to display   Initial Impression / Assessment and Plan / ED Course  I have reviewed the triage vital signs and the nursing notes.  Pertinent labs & imaging results that were available during my care of the patient were reviewed by me and considered in my medical decision making (see chart for details).     Patient presents with sensation of drifting towards her left yesterday.  She felt some unsteadiness, but symptoms have resolved.  No focal neuro deficits on exam today. Patient is nontoxic appearing, afebrile, not tachycardic on my exam, not tachypneic, not hypotensive, maintains excellent SPO2 on room air, and is in no apparent distress. Patient was anxious through much of my interactions with her.  When she was reassured and labs and imaging were reviewed, she calmed down quite a bit and her vital signs returned to normal. Return precautions discussed.  Patient voices understanding of these instructions, accepts the plan, and is comfortable with discharge.  Findings and plan of care discussed with Julianne Rice, MD. Dr. Lita Mains personally evaluated and examined this patient.  Vitals:   06/27/18 1208 06/27/18 1230 06/27/18 1515  BP: 137/84 123/80 128/80  Pulse: (!) 105 (!) 102 97  Resp: 20 (!) 22 (!) 23  Temp: 98 F (36.7 C)    TempSrc: Oral    SpO2: 100% 100% 97%   Orthostatic VS for the past 24 hrs:  BP- Lying Pulse- Lying BP- Sitting Pulse- Sitting BP- Standing at 0 minutes Pulse- Standing at 0 minutes  06/27/18 1450 125/80 96 (!) 146/92 94 132/85 102       Final  Clinical Impressions(s) / ED Diagnoses   Final diagnoses:  Unsteadiness    ED Discharge Orders    None       Layla Maw 06/28/18 1341    Julianne Rice, MD 07/03/18 [phone]

## 2018-06-27 NOTE — Discharge Instructions (Addendum)
Labs and imaging studies were reassuring. Follow-up with your primary care provider and neurology on this matter. Return to the ED for any recurrence of symptoms or any other major concerns.

## 2018-06-27 NOTE — ED Triage Notes (Signed)
Pt in concerned she has had a stroke, around 11am yesterday while she was driving she felt herself start drifting to the left, feels like when she tries to walk she is leaning that way, denies headache but states her head feels cloudy

## 2018-06-28 ENCOUNTER — Encounter: Payer: Self-pay | Admitting: Cardiovascular Disease

## 2018-06-28 ENCOUNTER — Ambulatory Visit (INDEPENDENT_AMBULATORY_CARE_PROVIDER_SITE_OTHER): Payer: BLUE CROSS/BLUE SHIELD | Admitting: Cardiovascular Disease

## 2018-06-28 VITALS — BP 105/69 | HR 96 | Ht 62.0 in | Wt 276.0 lb

## 2018-06-28 DIAGNOSIS — I5032 Chronic diastolic (congestive) heart failure: Secondary | ICD-10-CM

## 2018-06-28 DIAGNOSIS — E669 Obesity, unspecified: Secondary | ICD-10-CM

## 2018-06-28 DIAGNOSIS — I10 Essential (primary) hypertension: Secondary | ICD-10-CM | POA: Diagnosis not present

## 2018-06-28 DIAGNOSIS — G4733 Obstructive sleep apnea (adult) (pediatric): Secondary | ICD-10-CM

## 2018-06-28 DIAGNOSIS — E1169 Type 2 diabetes mellitus with other specified complication: Secondary | ICD-10-CM

## 2018-06-28 DIAGNOSIS — R0602 Shortness of breath: Secondary | ICD-10-CM

## 2018-06-28 DIAGNOSIS — R0789 Other chest pain: Secondary | ICD-10-CM

## 2018-06-28 DIAGNOSIS — E7800 Pure hypercholesterolemia, unspecified: Secondary | ICD-10-CM

## 2018-06-28 NOTE — Patient Instructions (Signed)
Medication Instructions:  Dr Sallyanne Kuster recommends that you continue on your current medications as directed. Please refer to the Current Medication list given to you today.  If you need a refill on your cardiac medications before your next appointment, please call your pharmacy.   Lab work: Your physician recommends that you return for lab work TODAY.  If you have labs (blood work) drawn today and your tests are completely normal, you will receive your results only by: Marland Kitchen MyChart Message (if you have MyChart) OR . A paper copy in the mail If you have any lab test that is abnormal or we need to change your treatment, we will call you to review the results.  Testing/Procedures: Echocardiogram - Your physician has requested that you have an echocardiogram. Echocardiography is a painless test that uses sound waves to create images of your heart. It provides your doctor with information about the size and shape of your heart and how well your heart's chambers and valves are working. This procedure takes approximately one hour. There are no restrictions for this procedure.  >> This will be performed at our [facility] location [place], [place] [address] [phone]  Follow-Up: At [facility], you and your health needs are our priority.  As part of our continuing mission to provide you with exceptional heart care, we have created designated Provider Care Teams.  These Care Teams include your primary Cardiologist (physician) and Advanced Practice Providers (APPs -  Physician Assistants and Nurse Practitioners) who all work together to provide you with the care you need, when you need it. You will need a follow up appointment in 12 months.  Please call our office 2 months in advance to schedule this appointment.  You may see Sanda Klein, MD or one of the following Advanced Practice Providers on your designated Care Team: [place], [place] . Fabian Sharp, PA-C

## 2018-06-28 NOTE — Progress Notes (Signed)
Patient ID: [name], female   DOB: 1961-04-09, 57 y.o.   MRN: [id number]    Cardiology Office Note    Date:  06/29/2018   ID:  [name], DOB 11-01-1960, MRN [id number]  PCP:  Shon Baton, MD  Cardiologist:   Sanda Klein, MD   No chief complaint on file.   History of Present Illness:  [name] is a 57 y.o. female with history of chronic diastolic CHF, morbid obesity, OSA, TIA, GERD, HTN, HLD, DM, asthma, anemia, former tobacco abuse who presents for f/u.  She has been diagnosed with ankylosing spondylitis and is followed by Dr. Estanislado Pandy, now on Cosentyx.  Has numerous musculoskeletal complaints.  Has pain in both buttocks the left knee (total knee replacement) in her right wrist.  Has not had any chest pain except for occasional sharp pain that is reproducible with palpation.  Her blood sugar has been well controlled.  About 5-6 days ago she noticed swelling in her ankles and increased her dose of diuretic from 40 to 60 mg for 3 days.  She did not notice a change in her weight before increasing the diuretic.  On Wednesday she felt very tired.  She was driving when she suddenly felt like she was tilting to the left and was exhausted.  The symptoms persisted all day and she eventually went to the emergency room where her work-up was benign.  Her ECG showed mild sinus tachycardia.  Routine labs were normal including a creatinine of 0.9 and a hemoglobin of 12.6.  Troponin was undetectable.  She feels better today.  She did not have cough, hemoptysis, pleuritic chest pain, unilateral leg swelling or tenderness  Her blood pressure has equally been low and she tends to run heart rate on the higher side of normal, in the 90s. Occasionally her systolic blood pressures in the double digits. She feels tired and a little dizzy.    Past Medical History:  Diagnosis Date  . Anemia    takes iron supplement  . Anxiety   . Asthma    daily and prn inhalers  . Bone spur    Left foot  . Chest pain    a. 10/2015: NST showing possible mild ischemia but most consistent with breast attenuation. Low-risk study.   . Chronic diastolic CHF (congestive heart failure) ([place])   . DDD (degenerative disc disease), cervical   . Degenerative disc disease   . Diabetes mellitus ([place])   . Essential hypertension   . Family history of early CAD   . Former tobacco use   . GERD (gastroesophageal reflux disease)   . GERD (gastroesophageal reflux disease)   . Headache(784.0)    migraine-like  . History of TIA (transient ischemic attack) early 2013   no weakness or deficits  . History of UTI   . Hyperlipidemia   . Insomnia   . Migraines   . Morbid obesity ([place])   . Partial tear of rotator cuff(726.13)   . Psoriatic arthritis ([place])   . Shoulder impingement 04/2012   left  . Sleep apnea    uses CPAP nightly    Past Surgical History:  Procedure Laterality Date  . ABDOMINAL HYSTERECTOMY  03/28/2002   complete  . ANTERIOR CERVICAL DECOMP/DISCECTOMY FUSION  02/18/2010   C6-7  . BREAST BIOPSY Right   . DILATION AND CURETTAGE OF UTERUS     2000  . FOOT SURGERY Right 2015  . ROTATOR CUFF REPAIR Left 2013  . TOTAL KNEE  ARTHROPLASTY  09/07/2009   left  . TOTAL KNEE ARTHROPLASTY  10/13/2008   right  . TOTAL SHOULDER ARTHROPLASTY    . UMBILICAL HERNIA REPAIR  03/28/2002    Outpatient Medications Prior to Visit  Medication Sig Dispense Refill  . acidophilus (RISAQUAD) CAPS Take 1 capsule by mouth every morning.     Marland Kitchen amoxicillin (AMOXIL) 500 MG capsule Take 500 mg by mouth as needed. Before dental procedures    . aspirin 81 MG tablet Take 81 mg by mouth daily as needed for pain.    Marland Kitchen atorvastatin (LIPITOR) 20 MG tablet Take 20 mg by mouth every evening.     Marland Kitchen azelastine (ASTELIN) 0.1 % nasal spray Place 1 spray into both nostrils as needed for rhinitis. Use in each nostril as directed    . baclofen (LIORESAL) 10 MG tablet Take 10-20 mg by mouth See admin instructions. Weekly as  needed    . Blood Glucose Monitoring Suppl (CONTOUR NEXT EZ) w/Device KIT by Does not apply route.    . Calcium Carbonate Antacid (TUMS E-X PO) Take 1 tablet by mouth daily at 12 noon. Take 1 tablet at bedtime    . chlorproMAZINE (THORAZINE) 25 MG tablet Take 25-50 mg by mouth See admin instructions. every 3 hours as needed max twice month  1  . COSENTYX SENSOREADY PEN 150 MG/ML SOAJ INJECT ONE PEN SUBCUTANEOUSLY EVERY 4 WEEKS. REFRIGERATE. ALLOW 15 TO30 MINUTES AT ROOM TEMP PRIOR TO ADMINISTRATION. (Patient taking differently: Inject 150 mg as directed every 30 (thirty) days. ) 3 pen 0  . cyclobenzaprine (FLEXERIL) 10 MG tablet Take 10 mg by mouth 2 (two) times daily as needed for muscle spasms.    . diclofenac sodium (VOLTAREN) 1 % GEL Apply three grams to three large joints up to three times daily (Patient taking differently: Apply 2 g topically as needed. Apply to Knee, fingers, elbow and left shoulder) 3 Tube 3  . DULoxetine (CYMBALTA) 60 MG capsule Take 60 mg by mouth daily.   3  . ferrous sulfate 325 (65 FE) MG tablet Take 325 mg by mouth 4 (four) times a week. Sunday, Tuesday, Thursday, and saturday    . furosemide (LASIX) 40 MG tablet TAKE 1 TABLET BY MOUTH DAILY (Patient taking differently: Take 40 mg by mouth daily. Take additional 20 mg for three days to get fluid is reduced) 90 tablet 3  . HYDROcodone-acetaminophen (NORCO/VICODIN) 5-325 MG per tablet Take 1 tablet by mouth as needed for moderate pain.     . hydrOXYzine (ATARAX/VISTARIL) 25 MG tablet Take 25 mg by mouth every 8 (eight) hours as needed. For itching.    . irbesartan (AVAPRO) 150 MG tablet Take 150 mg by mouth daily. If Blood pressure is below 110 take 75 mg  If Blood pressure is above 110 take 150 mg If blood sugar is 102 and below do not take blood pressure medication  3  . loratadine (CLARITIN) 10 MG tablet Take 10 mg by mouth daily.     Marland Kitchen LORazepam (ATIVAN) 0.5 MG tablet Take 0.5 mg by mouth as needed for anxiety.     .  meloxicam (MOBIC) 15 MG tablet Take 15 mg by mouth daily.   3  . metFORMIN (GLUCOPHAGE) 500 MG tablet Take 500 mg by mouth daily with breakfast.     . methocarbamol (ROBAXIN) 500 MG tablet Take 500 mg by mouth every 8 (eight) hours as needed for muscle spasms.   0  . Multiple Vitamin (MULITIVITAMIN WITH  MINERALS) TABS Take 1 tablet by mouth daily.    . nitroGLYCERIN (NITRO-DUR) 0.2 mg/hr patch Place 1 patch (0.2 mg total) onto the skin daily. (Patient taking differently: Place 0.2 mg onto the skin as needed (pain). ) 30 patch 12  . NON FORMULARY at bedtime. CPAP    . Olopatadine HCl (PATADAY OP) Place 1 drop into both eyes daily at 12 noon.     Marland Kitchen omeprazole (PRILOSEC) 20 MG capsule Take 20 mg by mouth 2 (two) times daily.     . potassium chloride SA (K-DUR,KLOR-CON) 20 MEQ tablet Take 40 mEq by mouth 2 (two) times daily.   3  . PROAIR HFA 108 (90 Base) MCG/ACT inhaler Inhale 2 puffs into the lungs as needed for shortness of breath.   0  . QUDEXY XR CS24 sprinkle capsule Take 300 mg by mouth at bedtime.   4  . QVAR REDIHALER 40 MCG/ACT inhaler Inhale 2 puffs into the lungs daily at 12 noon.   5  . vitamin B-12 (CYANOCOBALAMIN) 500 MCG tablet Take 500 mcg by mouth daily.     Marland Kitchen zolpidem (AMBIEN) 10 MG tablet Take 10 mg by mouth at bedtime as needed for sleep.    . beclomethasone (QVAR) 40 MCG/ACT inhaler Inhale 2 puffs into the lungs as needed.     . mupirocin nasal ointment (BACTROBAN) 2 % Place 1 application into the nose 2 (two) times daily. Use one-half of tube in each . After application, press sides of nose together and gently massage.    . ONE TOUCH ULTRA TEST test strip     . ONETOUCH DELICA LANCETS 15B MISC      No facility-administered medications prior to visit.      Allergies:   Patient has no known allergies.   Social History   Socioeconomic History  . Marital status: Married    Spouse name: Not on file  . Number of children: 5  . Years of education: Not on file  . Highest  education level: Not on file  Occupational History  . Occupation: Scientist, research (physical sciences): [employer]  . Financial resource strain: Not on file  . Food insecurity:    Worry: Not on file    Inability: Not on file  . Transportation needs:    Medical: Not on file    Non-medical: Not on file  Tobacco Use  . Smoking status: Former Smoker    Packs/day: 0.50    Years: 20.00    Pack years: 10.00    Types: Cigarettes    Last attempt to quit: 10/02/1999    Years since quitting: 18.7  . Smokeless tobacco: Never Used  Substance and Sexual Activity  . Alcohol use: No  . Drug use: No  . Sexual activity: Yes    Birth control/protection: None  Lifestyle  . Physical activity:    Days per week: Not on file    Minutes per session: Not on file  . Stress: Not on file  Relationships  . Social connections:    Talks on phone: Not on file    Gets together: Not on file    Attends religious service: Not on file    Active member of club or organization: Not on file    Attends meetings of clubs or organizations: Not on file    Relationship status: Not on file  Other Topics Concern  . Not on file  Social History Narrative  . Not on file  Family History:  The patient's family history includes Breast cancer in her sister and sister; Cancer in her maternal grandmother and paternal grandfather; Coronary artery disease in her sister; Diabetes in her father, maternal grandmother, mother, paternal grandfather, sister, and sister; Hypertension in her father, maternal grandmother, and mother; Kidney disease in her father.   ROS:   Please see the history of present illness.    ROS All other systems are reviewed and are negative   PHYSICAL EXAM:   VS:  BP 105/69   Pulse 96   Ht '5\' 2"'  (1.575 m)   Wt 276 lb (125.2 kg)   BMI 50.48 kg/m     General: Alert, oriented x3, no distress, super obese Head: no evidence of trauma, PERRL, EOMI, no exophtalmos or lid lag, no myxedema,  no xanthelasma; normal ears, nose and oropharynx Neck: normal jugular venous pulsations and no hepatojugular reflux; brisk carotid pulses without delay and no carotid bruits Chest: clear to auscultation, no signs of consolidation by percussion or palpation, normal fremitus, symmetrical and full respiratory excursions Cardiovascular: normal position and quality of the apical impulse, regular rhythm, normal first and second heart sounds, no murmurs, rubs or gallops Abdomen: no tenderness or distention, no masses by palpation, no abnormal pulsatility or arterial bruits, normal bowel sounds, no hepatosplenomegaly Extremities: no clubbing, cyanosis or edema; 2+ radial, ulnar and brachial pulses bilaterally; 2+ right femoral, posterior tibial and dorsalis pedis pulses; 2+ left femoral, posterior tibial and dorsalis pedis pulses; no subclavian or femoral bruits Neurological: grossly nonfocal Psych: Normal mood and affect   Wt Readings from Last 3 Encounters:  06/28/18 276 lb (125.2 kg)  06/22/18 278 lb 9.6 oz (126.4 kg)  01/17/18 280 lb 8 oz (127.2 kg)      Studies/Labs Reviewed:   EKG:  EKG was ordered in the ER last night and showed sinus rhythm, no repolarization abnormalities Recent Labs: 06/27/2018: ALT 15; BUN 12; Creatinine, Ser 0.90; Hemoglobin 12.6; Platelets 256; Potassium 4.0; Sodium 139   Lipid Panel    Component Value Date/Time   CHOL 143 03/12/2013 0200   TRIG 128 03/12/2013 0200   HDL 42 03/12/2013 0200   CHOLHDL 3.4 03/12/2013 0200   VLDL 26 03/12/2013 0200   LDLCALC 75 03/12/2013 0200     ASSESSMENT:    1. Chronic diastolic heart failure ([place])   2. Essential hypertension   3. Super obese   4. OSA (obstructive sleep apnea)   5. Other chest pain   6. Hypercholesterolemia   7. Diabetes mellitus type 2 in obese ([place])   8. Shortness of breath      PLAN:  In order of problems listed above:  1. CHF: I think her symptoms may have all been due to relative  hypovolemia from the increased dose of diuretic.  Had a small suspicion for pulmonary embolism, but we checked a d-dimer today and it was normal.  Her blood pressure is low and her heart rate is fast.  I asked her to hold off her diuretic until she gains back at least a couple of pounds.  She will keep a record of her weight at home and send it to [place] in a few days, also recording her heart rate and blood pressure in parallel.  We will check an echocardiogram to make sure there is been no change in LV function 2. HTN: May have to decrease her blood pressure medication further. 3. Obesity: Remains super obese despite her best efforts.  Encouraged her to  enrolled in a bariatric program.  Despite her best efforts she weighs exactly the same as she weighed back in 2014. 4. OSA: Reports compliance with CPAP and denies daytime hypersomnolence 5. Chest pain: The only type of chest pain that she is had recently is clearly musculoskeletal and reproducible with palpation.  I suspect it is related to her rheumatological issues. 6. HLP: Excellent lipid profile on statin. 7. DM: Reports good control on metformin    Medication Adjustments/Labs and Tests Ordered: Current medicines are reviewed at length with the patient today.  Concerns regarding medicines are outlined above.  Medication changes, Labs and Tests ordered today are listed in the Patient Instructions below. Patient Instructions  Medication Instructions:  Dr Sallyanne Kuster recommends that you continue on your current medications as directed. Please refer to the Current Medication list given to you today.  If you need a refill on your cardiac medications before your next appointment, please call your pharmacy.   Lab work: Your physician recommends that you return for lab work TODAY.  If you have labs (blood work) drawn today and your tests are completely normal, you will receive your results only by: Marland Kitchen MyChart Message (if you have MyChart) OR . A paper  copy in the mail If you have any lab test that is abnormal or we need to change your treatment, we will call you to review the results.  Testing/Procedures: Echocardiogram - Your physician has requested that you have an echocardiogram. Echocardiography is a painless test that uses sound waves to create images of your heart. It provides your doctor with information about the size and shape of your heart and how well your heart's chambers and valves are working. This procedure takes approximately one hour. There are no restrictions for this procedure.  >> This will be performed at our [facility] location [place], [place] [address] [phone]  Follow-Up: At [facility], you and your health needs are our priority.  As part of our continuing mission to provide you with exceptional heart care, we have created designated Provider Care Teams.  These Care Teams include your primary Cardiologist (physician) and Advanced Practice Providers (APPs -  Physician Assistants and Nurse Practitioners) who all work together to provide you with the care you need, when you need it. You will need a follow up appointment in 12 months.  Please call our office 2 months in advance to schedule this appointment.  You may see Sanda Klein, MD or one of the following Advanced Practice Providers on your designated Care Team: [place], [place] . Fabian Sharp, PA-C      Signed, Sanda Klein, MD  06/29/2018 4:32 PM    [facility] [address], [place], [place]  [address] Phone: [phone]; Fax: [phone]

## 2018-06-29 DIAGNOSIS — E1169 Type 2 diabetes mellitus with other specified complication: Secondary | ICD-10-CM | POA: Insufficient documentation

## 2018-06-29 DIAGNOSIS — E669 Obesity, unspecified: Secondary | ICD-10-CM | POA: Insufficient documentation

## 2018-06-29 LAB — D-DIMER, QUANTITATIVE: D-DIMER: 0.5 mg/L FEU — ABNORMAL HIGH (ref 0.00–0.49)

## 2018-07-03 ENCOUNTER — Other Ambulatory Visit (HOSPITAL_COMMUNITY): Payer: Self-pay | Admitting: Internal Medicine

## 2018-07-03 DIAGNOSIS — R42 Dizziness and giddiness: Secondary | ICD-10-CM

## 2018-07-04 ENCOUNTER — Ambulatory Visit (HOSPITAL_COMMUNITY)
Admission: RE | Admit: 2018-07-04 | Discharge: 2018-07-04 | Disposition: A | Discharge status: Home / Self Care | Payer: BLUE CROSS/BLUE SHIELD | Source: Ambulatory Visit | Attending: Vascular Surgery | Admitting: Vascular Surgery

## 2018-07-04 DIAGNOSIS — R42 Dizziness and giddiness: Secondary | ICD-10-CM | POA: Insufficient documentation

## 2018-07-06 ENCOUNTER — Ambulatory Visit (INDEPENDENT_AMBULATORY_CARE_PROVIDER_SITE_OTHER): Payer: BLUE CROSS/BLUE SHIELD | Admitting: Neurology

## 2018-07-06 ENCOUNTER — Encounter: Payer: Self-pay | Admitting: Neurology

## 2018-07-06 ENCOUNTER — Other Ambulatory Visit: Payer: Self-pay

## 2018-07-06 DIAGNOSIS — G43019 Migraine without aura, intractable, without status migrainosus: Secondary | ICD-10-CM

## 2018-07-06 DIAGNOSIS — H814 Vertigo of central origin: Secondary | ICD-10-CM

## 2018-07-06 HISTORY — DX: Migraine without aura, intractable, without status migrainosus: G43.019

## 2018-07-06 NOTE — Progress Notes (Signed)
Reason for visit: Dizziness  Referring physician: Dr. Nicholos Johns [name] is a 57 y.o. female  History of present illness:  [name] is a 57 year old right-handed black female with a history of morbid obesity, diabetes, chronic low back pain, and intractable migraine headache.  The patient had onset of some sensation of feeling tired and visual blurring when she was going to the [employer] on 26 June 2018.  The patient got her order and then returned home, and on the way home she began feeling as if she was veering to the left with driving, but she was able to stay in the lane.  Since that time, she has had a sensation of vertigo and gait instability.  She has started using a cane, she is no longer driving a car.  She denies any nausea or vomiting.  The patient does have intractable migraine, she continues to have migraine headaches off and on, the headaches are no different from her usual.  The patient has not had any falls or blackouts.  She reports no numbness or weakness of the arms or legs.  She does have some chronic neck pain and neck stiffness.  The patient reports that the only new medication that was added to her extensive regimen was Thorazine that she takes on an as-needed basis for her migraine.  The patient reports that about a year ago she had vertigo as well, she took meclizine for this and the vertigo resolved.  She does not relate the vertigo to the headache itself.  The patient denies any hearing changes, she does have chronic tinnitus, she denies ear pain.  She has not had any visual loss or double vision.  She denies any slurred speech or difficulty swallowing.  She comes to this office for an evaluation.  She is followed by Dr. Domingo Cocking for her migraine headaches.  Past Medical History:  Diagnosis Date  . Anemia    takes iron supplement  . Anxiety   . Asthma    daily and prn inhalers  . Bone spur    Left foot  . Chest pain    a. 10/2015: NST showing  possible mild ischemia but most consistent with breast attenuation. Low-risk study.   . Chronic diastolic CHF (congestive heart failure) ([place])   . DDD (degenerative disc disease), cervical   . Degenerative disc disease   . Diabetes mellitus ([name])   . Essential hypertension   . Family history of early CAD   . Former tobacco use   . GERD (gastroesophageal reflux disease)   . GERD (gastroesophageal reflux disease)   . Headache(784.0)    migraine-like  . History of TIA (transient ischemic attack) early 2013   no weakness or deficits  . History of UTI   . Hyperlipidemia   . Insomnia   . Migraines   . Morbid obesity ([name])   . Partial tear of rotator cuff(726.13)   . Psoriatic arthritis ([place])   . Shoulder impingement 04/2012   left  . Sleep apnea    uses CPAP nightly    Past Surgical History:  Procedure Laterality Date  . ABDOMINAL HYSTERECTOMY  03/28/2002   complete  . ANTERIOR CERVICAL DECOMP/DISCECTOMY FUSION  02/18/2010   C6-7  . BREAST BIOPSY Right   . DILATION AND CURETTAGE OF UTERUS     2000  . FOOT SURGERY Right 2015  . ROTATOR CUFF REPAIR Left 2013  . TOTAL KNEE ARTHROPLASTY  09/07/2009   left  .  TOTAL KNEE ARTHROPLASTY  10/13/2008   right  . TOTAL SHOULDER ARTHROPLASTY    . UMBILICAL HERNIA REPAIR  03/28/2002    Family History  Problem Relation Age of Onset  . Diabetes Mother   . Hypertension Mother   . Kidney disease Father   . Hypertension Father   . Diabetes Father   . Breast cancer Sister   . Diabetes Sister   . Breast cancer Sister   . Cancer Maternal Grandmother   . Diabetes Maternal Grandmother   . Hypertension Maternal Grandmother   . Diabetes Paternal Grandfather   . Cancer Paternal Grandfather   . Coronary artery disease Sister   . Diabetes Sister     Social history:  reports that she quit smoking about 18 years ago. Her smoking use included cigarettes. She has a 10.00 pack-year smoking history. She has never used smokeless tobacco. She reports  that she does not drink alcohol or use drugs.  Medications:  Prior to Admission medications   Medication Sig Start Date End Date Taking? Authorizing Provider  acidophilus (RISAQUAD) CAPS Take 1 capsule by mouth every morning.     [provider]  amoxicillin (AMOXIL) 500 MG capsule Take 500 mg by mouth as needed. Before dental procedures    [provider]  aspirin 81 MG tablet Take 81 mg by mouth daily as needed for pain.    [provider]  atorvastatin (LIPITOR) 20 MG tablet Take 20 mg by mouth every evening.     [provider]  azelastine (ASTELIN) 0.1 % nasal spray Place 1 spray into both nostrils as needed for rhinitis. Use in each nostril as directed    [provider]  baclofen (LIORESAL) 10 MG tablet Take 10-20 mg by mouth See admin instructions. Weekly as needed    [provider]  beclomethasone (QVAR) 40 MCG/ACT inhaler Inhale 2 puffs into the lungs as needed.     [provider]  Blood Glucose Monitoring Suppl (CONTOUR NEXT EZ) w/Device KIT by Does not apply route.    [provider]  Calcium Carbonate Antacid (TUMS E-X PO) Take 1 tablet by mouth daily at 12 noon. Take 1 tablet at bedtime    [provider]  chlorproMAZINE (THORAZINE) 25 MG tablet Take 25-50 mg by mouth See admin instructions. every 3 hours as needed max twice month 06/08/17   [provider]  COSENTYX SENSOREADY PEN 150 MG/ML SOAJ INJECT ONE PEN SUBCUTANEOUSLY EVERY 4 WEEKS. REFRIGERATE. ALLOW 15 TO30 MINUTES AT ROOM TEMP PRIOR TO ADMINISTRATION. Patient taking differently: Inject 150 mg as directed every 30 (thirty) days.  06/11/18   Bo Merino, MD  cyclobenzaprine (FLEXERIL) 10 MG tablet Take 10 mg by mouth 2 (two) times daily as needed for muscle spasms.    [provider]  diclofenac sodium (VOLTAREN) 1 % GEL Apply three grams to three large joints up to three times daily Patient taking differently: Apply 2 g  topically as needed. Apply to Knee, fingers, elbow and left shoulder 01/17/18   Ofilia Neas, PA-C  DULoxetine (CYMBALTA) 60 MG capsule Take 60 mg by mouth daily.  10/10/16   [provider]  ferrous sulfate 325 (65 FE) MG tablet Take 325 mg by mouth 4 (four) times a week. Sunday, Tuesday, Thursday, and saturday    [provider]  furosemide (LASIX) 40 MG tablet TAKE 1 TABLET BY MOUTH DAILY Patient taking differently: Take 40 mg by mouth daily. Take additional 20 mg for three  days to get fluid is reduced 08/28/17   Croitoru, Mihai, MD  HYDROcodone-acetaminophen (NORCO/VICODIN) 5-325 MG per tablet Take 1 tablet by mouth as needed for moderate pain.     [provider]  hydrOXYzine (ATARAX/VISTARIL) 25 MG tablet Take 25 mg by mouth every 8 (eight) hours as needed. For itching.    [provider]  irbesartan (AVAPRO) 150 MG tablet Take 150 mg by mouth daily. If Blood pressure is below 110 take 75 mg  If Blood pressure is above 110 take 150 mg If blood sugar is 102 and below do not take blood pressure medication 04/10/17   [provider]  loratadine (CLARITIN) 10 MG tablet Take 10 mg by mouth daily.     [provider]  LORazepam (ATIVAN) 0.5 MG tablet Take 0.5 mg by mouth as needed for anxiety.     [provider]  meloxicam (MOBIC) 15 MG tablet Take 15 mg by mouth daily.  06/07/18   [provider]  metFORMIN (GLUCOPHAGE) 500 MG tablet Take 500 mg by mouth daily with breakfast.  04/08/14   [provider]  methocarbamol (ROBAXIN) 500 MG tablet Take 500 mg by mouth every 8 (eight) hours as needed for muscle spasms.  04/04/18   [provider]  Multiple Vitamin (MULITIVITAMIN WITH MINERALS) TABS Take 1 tablet by mouth daily.    [provider]  mupirocin nasal ointment (BACTROBAN) 2 % Place 1 application into the nose 2 (two) times daily. Use one-half of tube in each . After application, press sides of nose  together and gently massage.    [provider]  nitroGLYCERIN (NITRO-DUR) 0.2 mg/hr patch Place 1 patch (0.2 mg total) onto the skin daily. Patient taking differently: Place 0.2 mg onto the skin as needed (pain).  12/13/16   Suzan Slick, NP  NON FORMULARY at bedtime. CPAP    [provider]  Olopatadine HCl (PATADAY OP) Place 1 drop into both eyes daily at 12 noon.     [provider]  omeprazole (PRILOSEC) 20 MG capsule Take 20 mg by mouth 2 (two) times daily.     Denita Lung, MD  ONE [facility] ULTRA TEST test strip  04/29/14   [provider]  Jonetta Speak LANCETS 16X MISC  04/29/14   [provider]  potassium chloride SA (K-DUR,KLOR-CON) 20 MEQ tablet Take 40 mEq by mouth 2 (two) times daily.  10/04/16   [provider]  PROAIR HFA 108 (90 Base) MCG/ACT inhaler Inhale 2 puffs into the lungs as needed for shortness of breath.  06/09/17   [provider]  QUDEXY XR CS24 sprinkle capsule Take 300 mg by mouth at bedtime.  06/05/18   [provider]  QVAR REDIHALER 40 MCG/ACT inhaler Inhale 2 puffs into the lungs daily at 12 noon.  06/14/18   [provider]  vitamin B-12 (CYANOCOBALAMIN) 500 MCG tablet Take 500 mcg by mouth daily.     [provider]  zolpidem (AMBIEN) 10 MG tablet Take 10 mg by mouth at bedtime as needed for sleep.    [provider]     No Known Allergies  ROS:  Out of a complete 14 system review of symptoms, the patient complains only of the following symptoms, and all other reviewed systems are negative.  Dizziness, gait instability Headache  Blood pressure 122/80, pulse 95, height 5' 2" (1.575 m), weight 278 lb (126.1 kg).  Physical Exam  General: The patient is alert  and cooperative at the time of the examination.  The patient is morbidly obese.  Eyes: Pupils are equal, round, and reactive to light. Discs are flat bilaterally.  Ears: Tympanic membranes are clear  bilaterally.  Neck: The neck is supple, no carotid bruits are noted.  Respiratory: The respiratory examination is clear.  Cardiovascular: The cardiovascular examination reveals a regular rate and rhythm, no obvious murmurs or rubs are noted.  Skin: Extremities are without significant edema.  Neurologic Exam  Mental status: The patient is alert and oriented x 3 at the time of the examination. The patient has apparent normal recent and remote memory, with an apparently normal attention span and concentration ability.  Cranial nerves: Facial symmetry is present. There is good sensation of the face to pinprick and soft touch bilaterally. The strength of the facial muscles and the muscles to head turning and shoulder shrug are normal bilaterally. Speech is well enunciated, no aphasia or dysarthria is noted. Extraocular movements are full. Visual fields are full. The tongue is midline, and the patient has symmetric elevation of the soft palate. No obvious hearing deficits are noted.  Motor: The motor testing reveals 5 over 5 strength of all 4 extremities. Good symmetric motor tone is noted throughout.  Sensory: Sensory testing is intact to pinprick, soft touch, vibration sensation, and position sense on all 4 extremities. No evidence of extinction is noted.  Coordination: Cerebellar testing reveals good finger-nose-finger and heel-to-shin bilaterally.  Gait and station: Gait is normal. Tandem gait is normal. Romberg is negative. No drift is seen.  Reflexes: Deep tendon reflexes are symmetric, but are slightly depressed bilaterally. Toes are downgoing bilaterally.   Carotid doppler 07/04/18:  Summary: Right Carotid: Velocities in the right ICA are consistent with a 1-39% stenosis.  Left Carotid: Velocities in the left ICA are consistent with a 1-39% stenosis.  Vertebrals: Bilateral vertebral arteries demonstrate antegrade flow. Subclavians: Normal flow hemodynamics were seen in bilateral  subclavian   CT head 06/27/18:  IMPRESSION: 1. Negative for age noncontrast CT appearance of the brain. 2. Chronic but increased mucosal disease in the left sphenoid sinus.  * CT scan images were reviewed online. I agree with the written report.    Assessment/Plan:  1.  Intractable migraine headache  2.  Vertigo, gait instability  The patient has had onset of mild vertigo that is present with sitting, standing, or lying down.  The patient has had vertigo in the past, she does not relate the vertigo to the headache.  Certainly, migraine can be associated with vertigo, but the patient does have risk factors for cerebrovascular disease.  A recent carotid Doppler study done was unremarkable.  The patient will be sent for MRI of the brain, CT head evaluation is not adequate to exclude a small brainstem stroke.  If this study is unremarkable, the patient may be referred to vestibular rehabilitation.  The patient will follow-up here in about 3 months.  Jill Alexanders MD 07/06/2018 10:05 AM  [facility] [address] [place], [place] [address]  Phone [phone] Fax [phone]

## 2018-07-10 ENCOUNTER — Ambulatory Visit (HOSPITAL_COMMUNITY): Payer: BLUE CROSS/BLUE SHIELD | Attending: Cardiovascular Disease

## 2018-07-10 ENCOUNTER — Telehealth: Payer: Self-pay | Admitting: Neurology

## 2018-07-10 ENCOUNTER — Other Ambulatory Visit: Payer: Self-pay

## 2018-07-10 DIAGNOSIS — R0602 Shortness of breath: Secondary | ICD-10-CM | POA: Insufficient documentation

## 2018-07-10 NOTE — Telephone Encounter (Signed)
Patient returned my call she is scheduled for 07/17/18 at [facility].

## 2018-07-10 NOTE — Telephone Encounter (Signed)
lvm for pt to call back about scheduling mri  [employer] Auth: [id number] (exp. 07/09/18 to 08/07/18)

## 2018-07-11 ENCOUNTER — Telehealth: Payer: Self-pay | Admitting: Cardiovascular Disease

## 2018-07-11 NOTE — Telephone Encounter (Signed)
Returned call to patient no answer.LMTC. 

## 2018-07-11 NOTE — Telephone Encounter (Signed)
Received call back from patient.She stated she received mychart message from Dr.Croitoru.He advised her to take Irbesartan 75 mg daily.She wanted to know if ok to take in mornings.Advised ok to take in mornings.

## 2018-07-11 NOTE — Telephone Encounter (Signed)
New message:       Pt c/o medication issue:  1. Name of Medication: irbesartan (AVAPRO) 150 MG tablet  2. How are you currently taking this medication (dosage and times per day)? Take 150 mg by mouth daily. If Blood pressure is below 110 take 75 mg If Blood pressure is above 110 take 150 mg If blood sugar is 102 and below do not take blood pressure medication  3. Are you having a reaction (difficulty breathing--STAT)? No  4. What is your medication issue? Pt is calling and saw the results of her echo on my chart and was ask to make some changes to this medication. Pt would also like to know if it matters if she is taking this medication in the morning/night or does it matter when she takes it.

## 2018-07-17 ENCOUNTER — Ambulatory Visit (INDEPENDENT_AMBULATORY_CARE_PROVIDER_SITE_OTHER): Payer: BLUE CROSS/BLUE SHIELD

## 2018-07-17 DIAGNOSIS — G43019 Migraine without aura, intractable, without status migrainosus: Secondary | ICD-10-CM

## 2018-07-17 DIAGNOSIS — H814 Vertigo of central origin: Secondary | ICD-10-CM | POA: Diagnosis not present

## 2018-07-18 ENCOUNTER — Telehealth: Payer: Self-pay | Admitting: Neurology

## 2018-07-18 NOTE — Telephone Encounter (Signed)
  I called the patient. The MRI of the brain is normal. The vertigo may be related to migraine or due to inner ear disease. If the vertigo persists, may consider vestibular rehab.  MRI brain 07/18/18:  IMPRESSION:   Unremarkable MRI brain (without). No acute findings.

## 2018-08-20 ENCOUNTER — Other Ambulatory Visit: Payer: Self-pay | Admitting: Cardiovascular Disease

## 2018-08-28 ENCOUNTER — Other Ambulatory Visit: Payer: Self-pay | Admitting: Rheumatology

## 2018-08-28 NOTE — Telephone Encounter (Signed)
Last Visit: 06/22/18 Next Visit: 11/23/18 Labs: 06/27/18 elevated glucose Hgb 11.5  TB Gold: 01/17/18 Neg   Okay to refill per Dr. Estanislado Pandy

## 2018-09-12 HISTORY — PX: WRIST SURGERY: SHX841

## 2018-09-17 ENCOUNTER — Other Ambulatory Visit: Payer: Self-pay

## 2018-09-17 ENCOUNTER — Telehealth: Payer: Self-pay | Admitting: Pharmacy Technician

## 2018-09-17 DIAGNOSIS — Z79899 Other long term (current) drug therapy: Secondary | ICD-10-CM

## 2018-09-17 NOTE — Telephone Encounter (Signed)
Received a Prior Authorization request from Patient for COSENTYX due to insurance change. Authorization has been submitted to patient's insurance via Cover My Meds. Will update once we receive a response.  Patient now has Medicare, so she completed application for [employer] PAP, will fax once we receive insurance response.  3:11 PM [name], CPhT

## 2018-09-18 DIAGNOSIS — Z6841 Body Mass Index (BMI) 40.0 and over, adult: Secondary | ICD-10-CM | POA: Diagnosis not present

## 2018-09-18 DIAGNOSIS — E119 Type 2 diabetes mellitus without complications: Secondary | ICD-10-CM | POA: Diagnosis not present

## 2018-09-18 DIAGNOSIS — M25532 Pain in left wrist: Secondary | ICD-10-CM | POA: Diagnosis not present

## 2018-09-18 LAB — COMPLETE METABOLIC PANEL WITH GFR
AG Ratio: 1.5 (calc) (ref 1.0–2.5)
ALT: 14 U/L (ref 6–29)
AST: 14 U/L (ref 10–35)
Albumin: 4.1 g/dL (ref 3.6–5.1)
Alkaline phosphatase (APISO): 121 U/L (ref 33–130)
BUN: 16 mg/dL (ref 7–25)
CO2: 24 mmol/L (ref 20–32)
Calcium: 9.3 mg/dL (ref 8.6–10.4)
Chloride: 108 mmol/L (ref 98–110)
Creat: 0.82 mg/dL (ref 0.50–1.05)
GFR, Est African American: 92 mL/min/{1.73_m2} (ref >=60)
GFR, Est Non African American: 79 mL/min/{1.73_m2} (ref >=60)
Globulin: 2.8 g/dL (calc) (ref 1.9–3.7)
Glucose, Bld: 104 mg/dL — ABNORMAL HIGH (ref 65–99)
Potassium: 4.3 mmol/L (ref 3.5–5.3)
Sodium: 139 mmol/L (ref 135–146)
Total Bilirubin: 0.2 mg/dL (ref 0.2–1.2)
Total Protein: 6.9 g/dL (ref 6.1–8.1)

## 2018-09-18 LAB — CBC WITH DIFFERENTIAL/PLATELET
Absolute Monocytes: 651 cells/uL (ref 200–950)
Basophils Absolute: 26 cells/uL (ref 0–200)
Basophils Relative: 0.3 %
Eosinophils Absolute: 229 cells/uL (ref 15–500)
Eosinophils Relative: 2.6 %
HCT: 35.3 % (ref 35.0–45.0)
Hemoglobin: 11.2 g/dL — ABNORMAL LOW (ref 11.7–15.5)
Lymphs Abs: 1848 cells/uL (ref 850–3900)
MCH: 25.7 pg — ABNORMAL LOW (ref 27.0–33.0)
MCHC: 31.7 g/dL — ABNORMAL LOW (ref 32.0–36.0)
MCV: 81.1 fL (ref 80.0–100.0)
MPV: 9.8 fL (ref 7.5–12.5)
Monocytes Relative: 7.4 %
Neutro Abs: 6046 cells/uL (ref 1500–7800)
Neutrophils Relative %: 68.7 %
Platelets: 272 10*3/uL (ref 140–400)
RBC: 4.35 10*6/uL (ref 3.80–5.10)
RDW: 14.4 % (ref 11.0–15.0)
Total Lymphocyte: 21 %
WBC: 8.8 10*3/uL (ref 3.8–10.8)

## 2018-09-18 NOTE — Progress Notes (Signed)
Labs are stable.

## 2018-09-20 NOTE — Telephone Encounter (Signed)
Received a fax from [place] regarding a prior authorization for [place]. Authorization has been APPROVED from 09/10/18 to 09/12/2019.   Will send document to scan center.  Authorization # [id number] Phone # [phone]  Faxed Patient Assistance application in to [employer]

## 2018-10-01 DIAGNOSIS — E7849 Other hyperlipidemia: Secondary | ICD-10-CM | POA: Diagnosis not present

## 2018-10-01 DIAGNOSIS — E119 Type 2 diabetes mellitus without complications: Secondary | ICD-10-CM | POA: Diagnosis not present

## 2018-10-01 DIAGNOSIS — I10 Essential (primary) hypertension: Secondary | ICD-10-CM | POA: Diagnosis not present

## 2018-10-01 DIAGNOSIS — Z0000 Encounter for general adult medical examination without abnormal findings: Secondary | ICD-10-CM | POA: Diagnosis not present

## 2018-10-01 DIAGNOSIS — R82998 Other abnormal findings in urine: Secondary | ICD-10-CM | POA: Diagnosis not present

## 2018-10-03 DIAGNOSIS — H02401 Unspecified ptosis of right eyelid: Secondary | ICD-10-CM | POA: Diagnosis not present

## 2018-10-03 DIAGNOSIS — M069 Rheumatoid arthritis, unspecified: Secondary | ICD-10-CM | POA: Diagnosis not present

## 2018-10-03 DIAGNOSIS — H11159 Pinguecula, unspecified eye: Secondary | ICD-10-CM | POA: Diagnosis not present

## 2018-10-03 DIAGNOSIS — H524 Presbyopia: Secondary | ICD-10-CM | POA: Diagnosis not present

## 2018-10-03 DIAGNOSIS — E119 Type 2 diabetes mellitus without complications: Secondary | ICD-10-CM | POA: Diagnosis not present

## 2018-10-03 DIAGNOSIS — Z7984 Long term (current) use of oral hypoglycemic drugs: Secondary | ICD-10-CM | POA: Diagnosis not present

## 2018-10-03 DIAGNOSIS — H52223 Regular astigmatism, bilateral: Secondary | ICD-10-CM | POA: Diagnosis not present

## 2018-10-03 DIAGNOSIS — H5203 Hypermetropia, bilateral: Secondary | ICD-10-CM | POA: Diagnosis not present

## 2018-10-03 DIAGNOSIS — I10 Essential (primary) hypertension: Secondary | ICD-10-CM | POA: Diagnosis not present

## 2018-10-04 DIAGNOSIS — M25532 Pain in left wrist: Secondary | ICD-10-CM | POA: Diagnosis not present

## 2018-10-04 NOTE — Telephone Encounter (Signed)
Left message for patient to advise

## 2018-10-04 NOTE — Telephone Encounter (Signed)
Received fax from [employer], patient's application has been APPROVED. Coverage dates are from 10/04/2018 to 09/12/2019.   Will send documents to [place].   Phone# [phone] Fax# [phone]

## 2018-10-05 DIAGNOSIS — Z1212 Encounter for screening for malignant neoplasm of rectum: Secondary | ICD-10-CM | POA: Diagnosis not present

## 2018-10-08 DIAGNOSIS — G43719 Chronic migraine without aura, intractable, without status migrainosus: Secondary | ICD-10-CM | POA: Diagnosis not present

## 2018-10-08 DIAGNOSIS — M791 Myalgia, unspecified site: Secondary | ICD-10-CM | POA: Diagnosis not present

## 2018-10-08 DIAGNOSIS — Z0000 Encounter for general adult medical examination without abnormal findings: Secondary | ICD-10-CM | POA: Diagnosis not present

## 2018-10-08 DIAGNOSIS — R7989 Other specified abnormal findings of blood chemistry: Secondary | ICD-10-CM | POA: Diagnosis not present

## 2018-10-08 DIAGNOSIS — R808 Other proteinuria: Secondary | ICD-10-CM | POA: Diagnosis not present

## 2018-10-08 DIAGNOSIS — I5189 Other ill-defined heart diseases: Secondary | ICD-10-CM | POA: Diagnosis not present

## 2018-10-08 DIAGNOSIS — M461 Sacroiliitis, not elsewhere classified: Secondary | ICD-10-CM | POA: Diagnosis not present

## 2018-10-08 DIAGNOSIS — N182 Chronic kidney disease, stage 2 (mild): Secondary | ICD-10-CM | POA: Diagnosis not present

## 2018-10-08 DIAGNOSIS — M542 Cervicalgia: Secondary | ICD-10-CM | POA: Diagnosis not present

## 2018-10-08 DIAGNOSIS — D8989 Other specified disorders involving the immune mechanism, not elsewhere classified: Secondary | ICD-10-CM | POA: Diagnosis not present

## 2018-10-08 DIAGNOSIS — G518 Other disorders of facial nerve: Secondary | ICD-10-CM | POA: Diagnosis not present

## 2018-10-08 DIAGNOSIS — M25532 Pain in left wrist: Secondary | ICD-10-CM | POA: Diagnosis not present

## 2018-10-08 DIAGNOSIS — R42 Dizziness and giddiness: Secondary | ICD-10-CM | POA: Diagnosis not present

## 2018-10-08 DIAGNOSIS — E119 Type 2 diabetes mellitus without complications: Secondary | ICD-10-CM | POA: Diagnosis not present

## 2018-10-17 NOTE — Progress Notes (Signed)
 [facility]  PATIENT: [name] DOB: 04/18/1961   REASON FOR VISIT: Follow-up for episodes of vertigo HISTORY FROM:patient    HISTORY OF PRESENT ILLNESS:UPDATE 2/6/2020CM [name], 57-year-old female returns for follow-up with history of vertigo and gait instability she was initially evaluated by Dr. Willis in October of last year she also has a history of intractable migraines and sees Dr. Freeman.  The patient is on disability due to back pain SI joint pain and migraines.  [name] has a history of vertigo occurring about a year and a half ago.  Since last seen she has not had any further vertigo episodes she denies any speech or swallowing difficulty no visual difficulty.  She has not had any falls.  She is morbidly obese.  Carotid Doppler study was unremarkable MRI of the brain without acute findings.  She returns for reevaluation 10/25/19KWMs. [name] is a 56-year-old right-handed black female with a history of morbid obesity, diabetes, chronic low back pain, and intractable migraine headache.  The patient had onset of some sensation of feeling tired and visual blurring when she was going to the [employer] caf[employer] on 26 June 2018.  The patient got her order and then returned home, and on the way home she began feeling as if she was veering to the left with driving, but she was able to stay in the lane.  Since that time, she has had a sensation of vertigo and gait instability.  She has started using a cane, she is no longer driving a car.  She denies any nausea or vomiting.  The patient does have intractable migraine, she continues to have migraine headaches off and on, the headaches are no different from her usual.  The patient has not had any falls or blackouts.  She reports no numbness or weakness of the arms or legs.  She does have some chronic neck pain and neck stiffness.  The patient reports that the only new medication that was added to her extensive regimen was  Thorazine that she takes on an as-needed basis for her migraine.  The patient reports that about a year ago she had vertigo as well, she took meclizine for this and the vertigo resolved.  She does not relate the vertigo to the headache itself.  The patient denies any hearing changes, she does have chronic tinnitus, she denies ear pain.  She has not had any visual loss or double vision.  She denies any slurred speech or difficulty swallowing.  She comes to this office for an evaluation.  She is followed by Dr. Freeman for her migraine headaches.  REVIEW OF SYSTEMS: Full 14 system review of systems performed and notable only for those listed, all others are neg:  Constitutional: neg  Cardiovascular: neg Ear/Nose/Throat: neg  Skin: neg Eyes: neg Respiratory: neg Gastroitestinal: neg  Hematology/Lymphatic: Easy bruising Endocrine: neg Musculoskeletal: Joint pain back pain Allergy/Immunology: neg Neurological: Migraine headaches Psychiatric: neg Sleep : neg   ALLERGIES: No Known Allergies  HOME MEDICATIONS: Outpatient Medications Prior to Visit  Medication Sig Dispense Refill  . acidophilus (RISAQUAD) CAPS Take 1 capsule by mouth every morning.     . amoxicillin (AMOXIL) 500 MG capsule Take 800 mg by mouth as needed. Before dental procedures     . aspirin 81 MG tablet Take 81 mg by mouth daily.     . atorvastatin (LIPITOR) 20 MG tablet Take 20 mg by mouth every evening.     . azelastine (ASTELIN) 0.1 % nasal   spray Place 1 spray into both nostrils as needed for rhinitis. Use in each nostril as directed    . baclofen (LIORESAL) 10 MG tablet Take 10-20 mg by mouth See admin instructions. Weekly as needed    . Blood Glucose Monitoring Suppl (CONTOUR NEXT EZ) w/Device KIT by Does not apply route.    . Calcium Carbonate Antacid (TUMS E-X PO) Take 1 tablet by mouth daily at 12 noon. Take 1 tablet at bedtime    . chlorproMAZINE (THORAZINE) 25 MG tablet Take 25-50 mg by mouth See admin  instructions. every 3 hours as needed max twice month  1  . COSENTYX SENSOREADY PEN 150 MG/ML SOAJ INJECT ONE PEN SUBCUTANEOUSLY EVERY 4 WEEKS. REFRIGERATE. ALLOW 15 TO 30 MINUTES AT ROOM TEMP PRIOR TO ADMINISTRATION. 3 pen 0  . cyclobenzaprine (FLEXERIL) 10 MG tablet Take 10 mg by mouth 2 (two) times daily as needed for muscle spasms.    . diclofenac sodium (VOLTAREN) 1 % GEL Apply three grams to three large joints up to three times daily (Patient taking differently: Apply 2 g topically as needed. Apply to Knee, fingers, elbow and left shoulder) 3 Tube 3  . DULoxetine (CYMBALTA) 60 MG capsule Take 60 mg by mouth daily.   3  . ferrous sulfate 325 (65 FE) MG tablet Take 325 mg by mouth 4 (four) times a week. Sunday, Tuesday, Thursday, and saturday    . furosemide (LASIX) 40 MG tablet TAKE 1 TABLET BY MOUTH DAILY 90 tablet 3  . HYDROcodone-acetaminophen (NORCO/VICODIN) 5-325 MG per tablet Take 1 tablet by mouth as needed for moderate pain.     . hydrOXYzine (ATARAX/VISTARIL) 25 MG tablet Take 25 mg by mouth every 8 (eight) hours as needed. For itching.    . irbesartan (AVAPRO) 75 MG tablet Take 75 mg by mouth daily.    . loratadine (CLARITIN) 10 MG tablet Take 10 mg by mouth daily.     . LORazepam (ATIVAN) 0.5 MG tablet Take 0.5 mg by mouth as needed for anxiety.     . meloxicam (MOBIC) 15 MG tablet Take 15 mg by mouth daily.   3  . metFORMIN (GLUCOPHAGE) 500 MG tablet Take 500 mg by mouth daily with breakfast.     . methocarbamol (ROBAXIN) 500 MG tablet Take 500 mg by mouth every 8 (eight) hours as needed for muscle spasms.   0  . Multiple Vitamin (MULITIVITAMIN WITH MINERALS) TABS Take 1 tablet by mouth daily.    . mupirocin nasal ointment (BACTROBAN) 2 % Place 1 application into the nose 2 (two) times daily. Use one-half of tube in each . After application, press sides of nose together and gently massage.    . nitroGLYCERIN (NITRO-DUR) 0.2 mg/hr patch Place 1 patch (0.2 mg total) onto the skin  daily. (Patient taking differently: Place 0.2 mg onto the skin as needed (pain). ) 30 patch 12  . NON FORMULARY at bedtime. CPAP    . Olopatadine HCl (PATADAY OP) Place 1 drop into both eyes daily at 12 noon.     . omeprazole (PRILOSEC) 20 MG capsule Take 20 mg by mouth 2 (two) times daily.     . ONE TOUCH ULTRA TEST test strip     . ONETOUCH DELICA LANCETS 33G MISC     . potassium chloride SA (K-DUR,KLOR-CON) 20 MEQ tablet Take 40 mEq by mouth 2 (two) times daily.   3  . PROAIR HFA 108 (90 Base) MCG/ACT inhaler Inhale 2 puffs into the lungs   as needed for shortness of breath.   0  . QUDEXY XR CS24 sprinkle capsule Take 300 mg by mouth at bedtime.   4  . QVAR REDIHALER 40 MCG/ACT inhaler Inhale 2 puffs into the lungs daily at 12 noon.   5  . vitamin B-12 (CYANOCOBALAMIN) 500 MCG tablet Take 500 mcg by mouth daily as needed.     . zolpidem (AMBIEN) 10 MG tablet Take 10 mg by mouth at bedtime as needed for sleep.    . irbesartan (AVAPRO) 150 MG tablet Take 1/2 tablet ( 75 mg ) daily 30 tablet 6   No facility-administered medications prior to visit.     PAST MEDICAL HISTORY: Past Medical History:  Diagnosis Date  . Anemia    takes iron supplement  . Anxiety   . Asthma    daily and prn inhalers  . Bone spur    Left foot  . Chest pain    a. 10/2015: NST showing possible mild ischemia but most consistent with breast attenuation. Low-risk study.   . Chronic diastolic CHF (congestive heart failure) ([place])   . Common migraine with intractable migraine 07/06/2018  . DDD (degenerative disc disease), cervical   . Degenerative disc disease   . Diabetes mellitus ([place])   . Essential hypertension   . Family history of early CAD   . Former tobacco use   . GERD (gastroesophageal reflux disease)   . GERD (gastroesophageal reflux disease)   . Headache(784.0)    migraine-like  . History of TIA (transient ischemic attack) early 2013   no weakness or deficits  . History of UTI   . Hyperlipidemia     . Insomnia   . Migraines   . Morbid obesity ([place])   . Partial tear of rotator cuff(726.13)   . Psoriatic arthritis ([place])   . Shoulder impingement 04/2012   left  . Sleep apnea    uses CPAP nightly    PAST SURGICAL HISTORY: Past Surgical History:  Procedure Laterality Date  . ABDOMINAL HYSTERECTOMY  03/28/2002   complete  . ANTERIOR CERVICAL DECOMP/DISCECTOMY FUSION  02/18/2010   C6-7  . BREAST BIOPSY Right   . DILATION AND CURETTAGE OF UTERUS     2000  . FOOT SURGERY Right 2015  . ROTATOR CUFF REPAIR Left 2013  . TOTAL KNEE ARTHROPLASTY  09/07/2009   left  . TOTAL KNEE ARTHROPLASTY  10/13/2008   right  . TOTAL SHOULDER ARTHROPLASTY    . UMBILICAL HERNIA REPAIR  03/28/2002    FAMILY HISTORY: Family History  Problem Relation Age of Onset  . Diabetes Mother   . Hypertension Mother   . Kidney disease Father   . Hypertension Father   . Diabetes Father   . Breast cancer Sister   . Diabetes Sister   . Breast cancer Sister   . Cancer Maternal Grandmother   . Diabetes Maternal Grandmother   . Hypertension Maternal Grandmother   . Diabetes Paternal Grandfather   . Cancer Paternal Grandfather   . Coronary artery disease Sister   . Diabetes Sister     SOCIAL HISTORY: Social History   Socioeconomic History  . Marital status: Married    Spouse name: Jenny Reichmann  . Number of children: 5  . Years of education: some college  . Highest education level: Not on file  Occupational History  . Occupation: Disabled  Social Needs  . Financial resource strain: Not on file  . Food insecurity:    Worry: Not on file  Inability: Not on file  . Transportation needs:    Medical: Not on file    Non-medical: Not on file  Tobacco Use  . Smoking status: Former Smoker    Packs/day: 0.50    Years: 20.00    Pack years: 10.00    Types: Cigarettes    Last attempt to quit: 10/02/1999    Years since quitting: 19.0  . Smokeless tobacco: Never Used  Substance and Sexual Activity  . Alcohol  use: No  . Drug use: No  . Sexual activity: Yes    Birth control/protection: None  Lifestyle  . Physical activity:    Days per week: Not on file    Minutes per session: Not on file  . Stress: Not on file  Relationships  . Social connections:    Talks on phone: Not on file    Gets together: Not on file    Attends religious service: Not on file    Active member of club or organization: Not on file    Attends meetings of clubs or organizations: Not on file    Relationship status: Not on file  . Intimate partner violence:    Fear of current or ex partner: Not on file    Emotionally abused: Not on file    Physically abused: Not on file    Forced sexual activity: Not on file  Other Topics Concern  . Not on file  Social History Narrative   Lives with husband, John   Caffeine use: 1 cup coffee every morning   Right handed      PHYSICAL EXAM  Vitals:   10/18/18 0901  BP: 123/72  Pulse: 77  Weight: 272 lb 3.2 oz (123.5 kg)  Height: 5' 2" (1.575 m)   Body mass index is 49.79 kg/m.  Generalized: Well developed, morbidly obese female in no acute distress  Head: normocephalic and atraumatic,. Oropharynx benign  Neck: Supple,  Musculoskeletal: No deformity  Skin no rash or edema Neurological examination   Mentation: Alert oriented to time, place, history taking. Attention span and concentration appropriate. Recent and remote memory intact.  Follows all commands speech and language fluent.   Cranial nerve II-XII: .Pupils were equal round reactive to light extraocular movements were full, visual field were full on confrontational test. Facial sensation and strength were normal. hearing was intact to finger rubbing bilaterally. Uvula tongue midline. head turning and shoulder shrug were normal and symmetric.Tongue protrusion into cheek strength was normal. Motor: normal bulk and tone, full strength in the BUE, BLE,  Sensory: normal and symmetric to light touch, pinprick, and   Vibration in the lower extremities  Coordination: finger-nose-finger, heel-to-shin bilaterally, no dysmetria Reflexes: Depressed and symmetric upper and lower plantar responses were flexor bilaterally. Gait and Station: Rising up from seated position without assistance, normal stance,  moderate stride, good arm swing, smooth turning, able to perform tiptoe, and heel walking without difficulty. Tandem gait is steady  DIAGNOSTIC DATA (LABS, IMAGING, TESTING) - I reviewed patient records, labs, notes, testing and imaging myself where available.  Lab Results  Component Value Date   WBC 8.8 09/17/2018   HGB 11.2 (L) 09/17/2018   HCT 35.3 09/17/2018   MCV 81.1 09/17/2018   PLT 272 09/17/2018      Component Value Date/Time   NA 139 09/17/2018 1219   NA 139 05/31/2016   K 4.3 09/17/2018 1219   CL 108 09/17/2018 1219   CO2 24 09/17/2018 1219   GLUCOSE 104 (H) 09/17/2018 1219     BUN 16 09/17/2018 1219   BUN 18 05/31/2016   CREATININE 0.82 09/17/2018 1219   CALCIUM 9.3 09/17/2018 1219   PROT 6.9 09/17/2018 1219   ALBUMIN 3.6 06/27/2018 1212   AST 14 09/17/2018 1219   ALT 14 09/17/2018 1219   ALKPHOS 119 06/27/2018 1212   BILITOT 0.2 09/17/2018 1219   GFRNONAA 79 09/17/2018 1219   GFRAA 92 09/17/2018 1219   Lab Results  Component Value Date   CHOL 143 03/12/2013   HDL 42 03/12/2013   LDLCALC 75 03/12/2013   TRIG 128 03/12/2013   CHOLHDL 3.4 03/12/2013    Lab Results  Component Value Date   TSH 0.95 10/22/2015      ASSESSMENT AND PLAN  58 y.o. year old female  has a past medical history of Intractable migraine headache vertigo and gait instability. She is seen by Dr. Domingo Cocking for her migraines.  Doppler study was unremarkable MRI of the brain without acute findings.  No further episodes of vertigo    Vertigo episodes very intermittent none recently If vertigo recurs in the future she may be set up for vestibular rehab Follow up prn Dennie Bible, [facility], [facility],  [facility] [address], [place], [place] [address] [phone]

## 2018-10-18 ENCOUNTER — Encounter: Payer: Self-pay | Admitting: Nurse Practitioner

## 2018-10-18 ENCOUNTER — Ambulatory Visit: Payer: Medicare HMO | Admitting: Nurse Practitioner

## 2018-10-18 DIAGNOSIS — R42 Dizziness and giddiness: Secondary | ICD-10-CM | POA: Insufficient documentation

## 2018-10-18 NOTE — Progress Notes (Signed)
I have read the note, and I agree with the clinical assessment and plan.  Cyan Clippinger K Francesa Eugenio   

## 2018-10-18 NOTE — Patient Instructions (Signed)
MRI of the brain without acute changes Vertigo episodes very intermittent Follow up prn

## 2018-11-09 NOTE — Progress Notes (Signed)
Office Visit Note  Patient: [name]             Date of Birth: [birth year]           MRN: [id number]             PCP: Shon Baton, MD Referring: Shon Baton, MD Visit Date: 11/23/2018 Occupation: _0 @  Subjective:  Pain in multiple joints    History of Present Illness: [name] is a 58 y.o. female with history of spondyloarthropathy and DDD.  She is on Cosentyx 150 mg sq injections every month.  She has not missed any doses recently.  She has not had any recent infections. She reports that she is due for her next Cosentyx injection on Monday.  She states that leading up to injections she typically develops increased SI joint pain.  She is experiencing left SI joint pain at this time. She has chronic lower back pain and neck stiffness. She states she has been having increased left wrist pain and has been seeing Dr. Noemi Chapel.  She reports she was diagnosed with tendonitis and had a cortisone injection on 10/04/18 that provided temporary relief.  She is being referred to Dr. Lucia Bitter to discuss surgery.  She continues to have bilateral medial epicondylitis.  She reports she has intermittent pain in bilateral knee replacements. She experiences occasional left shoulder pain when laying on her left side. She continues to take hydrocodone for pain relief and uses voltaren gel and tiger balm.     Activities of Daily Living:  Patient reports morning stiffness for 30 minutes.   Patient Reports nocturnal pain.  Difficulty dressing/grooming: Denies Difficulty climbing stairs: Reports Difficulty getting out of chair: Reports Difficulty using hands for taps, buttons, cutlery, and/or writing: Denies  Review of Systems  Constitutional: Positive for fatigue.  HENT: Positive for mouth dryness. Negative for mouth sores and nose dryness.   Eyes: Negative for pain, visual disturbance and dryness.  Respiratory: Negative for cough, hemoptysis, shortness of breath and difficulty  breathing.   Cardiovascular: Negative for chest pain, palpitations, hypertension and swelling in legs/feet.  Gastrointestinal: Negative for blood in stool, constipation and diarrhea.  Endocrine: Negative for increased urination.  Genitourinary: Negative for painful urination.  Musculoskeletal: Positive for arthralgias, joint pain, joint swelling and morning stiffness. Negative for myalgias, muscle weakness, muscle tenderness and myalgias.  Skin: Negative for color change, pallor, rash, hair loss, nodules/bumps, skin tightness, ulcers and sensitivity to sunlight.  Allergic/Immunologic: Negative for susceptible to infections.  Neurological: Negative for dizziness, numbness, headaches and weakness.  Hematological: Negative for swollen glands.  Psychiatric/Behavioral: Negative for depressed mood and sleep disturbance. The patient is not nervous/anxious.     PMFS History:  Patient Active Problem List   Diagnosis Date Noted  . Vertigo 10/18/2018  . Common migraine with intractable migraine 07/06/2018  . Diabetes mellitus type 2 in obese ([place]) 06/29/2018  . Spondyloarthropathy 07/26/2016  . High risk medication use 07/26/2016  . Chronic low back pain 07/26/2016  . H/O total knee replacement, bilateral 07/26/2016  . Chronic diastolic heart failure ([place]) 11/29/2015  . Dyspnea 06/03/2015  . Lung nodule seen on imaging study, CTA of chest, 6 mm nodule, followed by Dr. Virgina Jock 03/25/2013  . Family history of coronary artery disease 03/12/2013  . Super obese 03/12/2013  . Chest pain with low risk of acute coronary syndrome - most likely musculoskeletal, negative nuc study 03/11/2013  . Full thickness rotator cuff tear 05/15/2012  . Hypercholesterolemia   .  DJD (degenerative joint disease), cervical   . OSA (obstructive sleep apnea)   . Essential hypertension   . History of TIA (transient ischemic attack) 9/13   . Partial tear of rotator cuff(726.13)   . Shoulder impingement 04/12/2012    Past  Medical History:  Diagnosis Date  . Anemia    takes iron supplement  . Anxiety   . Asthma    daily and prn inhalers  . Bone spur    Left foot  . Chest pain    a. 10/2015: NST showing possible mild ischemia but most consistent with breast attenuation. Low-risk study.   . Chronic diastolic CHF (congestive heart failure) ([place])   . Common migraine with intractable migraine 07/06/2018  . DDD (degenerative disc disease), cervical   . Degenerative disc disease   . Diabetes mellitus ([place])   . Essential hypertension   . Family history of early CAD   . Former tobacco use   . GERD (gastroesophageal reflux disease)   . GERD (gastroesophageal reflux disease)   . Headache(784.0)    migraine-like  . History of TIA (transient ischemic attack) early 2013   no weakness or deficits  . History of UTI   . Hyperlipidemia   . Insomnia   . Migraines   . Morbid obesity ([place])   . Partial tear of rotator cuff(726.13)   . Psoriatic arthritis ([place])   . Shoulder impingement 04/2012   left  . Sleep apnea    uses CPAP nightly    Family History  Problem Relation Age of Onset  . Diabetes Mother   . Hypertension Mother   . Kidney disease Father   . Hypertension Father   . Diabetes Father   . Breast cancer Sister   . Diabetes Sister   . Breast cancer Sister   . Cancer Maternal Grandmother   . Diabetes Maternal Grandmother   . Hypertension Maternal Grandmother   . Diabetes Paternal Grandfather   . Cancer Paternal Grandfather   . Coronary artery disease Sister   . Diabetes Sister    Past Surgical History:  Procedure Laterality Date  . ABDOMINAL HYSTERECTOMY  03/28/2002   complete  . ANTERIOR CERVICAL DECOMP/DISCECTOMY FUSION  02/18/2010   C6-7  . BREAST BIOPSY Right   . DILATION AND CURETTAGE OF UTERUS     2000  . FOOT SURGERY Right 2015  . ROTATOR CUFF REPAIR Left 2013  . TOTAL KNEE ARTHROPLASTY  09/07/2009   left  . TOTAL KNEE ARTHROPLASTY  10/13/2008   right  . TOTAL SHOULDER  ARTHROPLASTY    . UMBILICAL HERNIA REPAIR  03/28/2002   Social History   Social History Narrative   Lives with husband, Jenny Reichmann   Caffeine use: 1 cup coffee every morning   Right handed    Immunization History  Administered Date(s) Administered  . Influenza-Unspecified 06/05/2018     Objective: Vital Signs: BP 129/77 (BP Location: Left Arm, Patient Position: Sitting, Cuff Size: Large)   Pulse 85   Resp 14   Ht 5' 2" (1.575 m)   Wt 271 lb (122.9 kg)   BMI 49.57 kg/m    Physical Exam Vitals signs and nursing note reviewed.  Constitutional:      Appearance: She is well-developed.  HENT:     Head: Normocephalic and atraumatic.  Eyes:     Conjunctiva/sclera: Conjunctivae normal.  Neck:     Musculoskeletal: Normal range of motion.  Cardiovascular:     Rate and Rhythm: Normal rate and regular rhythm.  Heart sounds: Normal heart sounds.  Pulmonary:     Effort: Pulmonary effort is normal.     Breath sounds: Normal breath sounds.  Abdominal:     General: Bowel sounds are normal.     Palpations: Abdomen is soft.  Lymphadenopathy:     Cervical: No cervical adenopathy.  Skin:    General: Skin is warm and dry.     Capillary Refill: Capillary refill takes less than 2 seconds.  Neurological:     Mental Status: She is alert and oriented to person, place, and time.  Psychiatric:        Behavior: Behavior normal.      Musculoskeletal Exam: C-spine, thoracic spine, lumbar spine good range of motion.  No midline spinal tenderness.  She has left SI joint tenderness.  Shoulder joints, ligaments restraints, MCPs, PIPs, DIPs good range of motion no synovitis.  She has medial epicondylitis bilaterally.  She has complete fist formation bilaterally.  Hip joints good range of motion with no discomfort.  No tenderness over trochanter bursa.  She is good range of motion of bilateral knee replacements and no warmth or effusion was noted.  No tenderness or swelling of ankle joints.  No Achilles  tenderness.  She has mild tenderness of bilateral plantar fascia.  CDAI Exam: CDAI Score: Not documented Patient Global Assessment: Not documented; Provider Global Assessment: Not documented Swollen: Not documented; Tender: Not documented Joint Exam   Not documented   There is currently no information documented on the homunculus. Go to the Rheumatology activity and complete the homunculus joint exam.  Investigation: No additional findings.  Imaging: No results found.  Recent Labs: Lab Results  Component Value Date   WBC 8.8 09/17/2018   HGB 11.2 (L) 09/17/2018   PLT 272 09/17/2018   NA 139 09/17/2018   K 4.3 09/17/2018   CL 108 09/17/2018   CO2 24 09/17/2018   GLUCOSE 104 (H) 09/17/2018   BUN 16 09/17/2018   CREATININE 0.82 09/17/2018   BILITOT 0.2 09/17/2018   ALKPHOS 119 06/27/2018   AST 14 09/17/2018   ALT 14 09/17/2018   PROT 6.9 09/17/2018   ALBUMIN 3.6 06/27/2018   CALCIUM 9.3 09/17/2018   GFRAA 92 09/17/2018   QFTBGOLDPLUS NEGATIVE 01/17/2018    Speciality Comments: No specialty comments available.  Procedures:  No procedures performed Allergies: Patient has no known allergies.   Assessment / Plan:     Visit Diagnoses: Spondyloarthropathy - MRI positive for sacroiliitis, HLA-B27 negative, elevated ESR: She presents today with left SI joint tenderness.  She continues to have chronic lower back pain.  She has good range of motion with no midline spinal tenderness on exam today.  She is due for her next Cosentyx injection on Monday.  She typically develops increased SI joint pain prior to her next injection.  She has been injecting Cosentyx 150 mg every 28 days.  She has not missed any doses recently.  She is on any recent infections.  She continues to have mild bilateral plantar fasciitis but no Achilles tendinitis.  She has not had any rashes or symptoms of uveitis recently.  She has no synovitis on exam.  She will continue on the current treatment regimen.   She was advised to notify us that shows increased joint pain or joint swelling.  She will follow-up in the office in 5 months.  High risk medication use -Cosentyx 150 mg every 28 days.  Last TB gold negative on 01/17/2018 and will monitor yearly. Future  order placed today.  Most recent CBC/CMP stable on 09/17/2018 and will monitor every 3 months.  Standing orders are in place.  She received her flu shot in September.    - Plan: QuantiFERON-TB Gold Plus  Medial epicondylitis of both elbows: She has tenderness bilaterally.   Arthropathy of lumbar facet joint: Chronic pain . She has good ROM with no discomfort.  No midline spinal tenderness.   DDD (degenerative disc disease), cervical: She has good ROM with no discomfort at this time.  She has no symptoms of radiculopathy.  She experiences intermittent neck stiffness.   H/O total knee replacement, bilateral: No warmth or effusion of knee joints.  She has good ROM with no discomfort. She experiences occasional discomfort in both knee joints.  She follows up with Dr. Noemi Chapel on a regular basis. She uses voltaren gel and tiger balm topically for pain relief.  Other medical conditions are listed as follows:   Lung nodule seen on imaging study, CTA of chest, 6 mm nodule, followed by Dr. Virgina Jock  History of diabetes mellitus  Chronic diastolic heart failure ([place])  History of hyperlipidemia  History of TIA (transient ischemic attack)  History of hypertension  History of sleep apnea   Orders: Orders Placed This Encounter  Procedures  . QuantiFERON-TB Gold Plus   No orders of the defined types were placed in this encounter.     Follow-Up Instructions: Return in about 5 months (around 04/25/2019) for Spondyloarthropathy.   Ofilia Neas, PA-C  Note - This record has been created using Dragon software.  Chart creation errors have been sought, but may not always  have been located. Such creation errors do not reflect on  the standard of  medical care.

## 2018-11-19 DIAGNOSIS — M25532 Pain in left wrist: Secondary | ICD-10-CM | POA: Diagnosis not present

## 2018-11-23 ENCOUNTER — Other Ambulatory Visit: Payer: Self-pay

## 2018-11-23 ENCOUNTER — Ambulatory Visit: Payer: BLUE CROSS/BLUE SHIELD | Admitting: Physician Assistant

## 2018-11-23 ENCOUNTER — Encounter: Payer: Self-pay | Admitting: Physician Assistant

## 2018-11-23 VITALS — BP 129/77 | HR 85 | Resp 14 | Ht 62.0 in | Wt 271.0 lb

## 2018-11-23 DIAGNOSIS — Z96653 Presence of artificial knee joint, bilateral: Secondary | ICD-10-CM | POA: Diagnosis not present

## 2018-11-23 DIAGNOSIS — M7701 Medial epicondylitis, right elbow: Secondary | ICD-10-CM

## 2018-11-23 DIAGNOSIS — Z79899 Other long term (current) drug therapy: Secondary | ICD-10-CM | POA: Diagnosis not present

## 2018-11-23 DIAGNOSIS — I5032 Chronic diastolic (congestive) heart failure: Secondary | ICD-10-CM | POA: Diagnosis not present

## 2018-11-23 DIAGNOSIS — M5030 Other cervical disc degeneration, unspecified cervical region: Secondary | ICD-10-CM

## 2018-11-23 DIAGNOSIS — Z8673 Personal history of transient ischemic attack (TIA), and cerebral infarction without residual deficits: Secondary | ICD-10-CM

## 2018-11-23 DIAGNOSIS — Z8639 Personal history of other endocrine, nutritional and metabolic disease: Secondary | ICD-10-CM | POA: Diagnosis not present

## 2018-11-23 DIAGNOSIS — Z8679 Personal history of other diseases of the circulatory system: Secondary | ICD-10-CM

## 2018-11-23 DIAGNOSIS — R911 Solitary pulmonary nodule: Secondary | ICD-10-CM | POA: Diagnosis not present

## 2018-11-23 DIAGNOSIS — M47819 Spondylosis without myelopathy or radiculopathy, site unspecified: Secondary | ICD-10-CM | POA: Diagnosis not present

## 2018-11-23 DIAGNOSIS — M7702 Medial epicondylitis, left elbow: Secondary | ICD-10-CM

## 2018-11-23 DIAGNOSIS — M47816 Spondylosis without myelopathy or radiculopathy, lumbar region: Secondary | ICD-10-CM | POA: Diagnosis not present

## 2018-11-23 DIAGNOSIS — Z8669 Personal history of other diseases of the nervous system and sense organs: Secondary | ICD-10-CM

## 2018-11-23 NOTE — Patient Instructions (Addendum)
Standing Labs We placed an order today for your standing lab work.    Please come back and get your standing labs in April and every 3 months   TB gold, CBC, and CMP    We have open lab Monday through Friday from 8:30-11:30 AM and 1:30-4:00 PM  at the office of Dr. Bo Merino.   You may experience shorter wait times on Monday and Friday afternoons. The office is located at [address], [place], [place], [place] [address] No appointment is necessary.   Labs are drawn by [facility].  You may receive a bill from [facility] for your lab work.  If you wish to have your labs drawn at another location, please call the office 24 hours in advance to send orders.  If you have any questions regarding directions or hours of operation,  please call [phone].   Just as a reminder please drink plenty of water prior to coming for your lab work. Thanks!

## 2018-11-27 DIAGNOSIS — M654 Radial styloid tenosynovitis [de Quervain]: Secondary | ICD-10-CM | POA: Diagnosis not present

## 2018-12-12 ENCOUNTER — Telehealth: Payer: Self-pay | Admitting: Rheumatology

## 2018-12-12 NOTE — Telephone Encounter (Signed)
Patient called stating she is due for her labwork this month.  Patient states she is very nervous to leave her home and is requesting a return call to let her know if she can wait til next month or until the stay at home order is over.

## 2018-12-12 NOTE — Telephone Encounter (Signed)
Attempted to contact patient and left message on machine to advise patient she is due for labs this month but can wait a few weeks if needed due to the virus. Advised patient we are currently not drawing labs in the office and we recommend patient go to [facility] on [address] [facility].

## 2018-12-13 DIAGNOSIS — G4733 Obstructive sleep apnea (adult) (pediatric): Secondary | ICD-10-CM | POA: Diagnosis not present

## 2019-01-04 ENCOUNTER — Other Ambulatory Visit: Payer: Self-pay | Admitting: *Deleted

## 2019-01-04 ENCOUNTER — Telehealth: Payer: Self-pay | Admitting: Rheumatology

## 2019-01-04 DIAGNOSIS — Z79899 Other long term (current) drug therapy: Secondary | ICD-10-CM | POA: Diagnosis not present

## 2019-01-04 NOTE — Telephone Encounter (Signed)
Lab orders released.  

## 2019-01-04 NOTE — Telephone Encounter (Signed)
Patient called requesting labwork orders be sent to [facility] on [address].  Patient states she plans to go this afternoon.

## 2019-01-06 LAB — COMPLETE METABOLIC PANEL WITH GFR
AG Ratio: 1.4 (calc) (ref 1.0–2.5)
ALT: 13 U/L (ref 6–29)
AST: 15 U/L (ref 10–35)
Albumin: 3.8 g/dL (ref 3.6–5.1)
Alkaline phosphatase (APISO): 109 U/L (ref 37–153)
BUN: 16 mg/dL (ref 7–25)
CO2: 24 mmol/L (ref 20–32)
Calcium: 9.1 mg/dL (ref 8.6–10.4)
Chloride: 110 mmol/L (ref 98–110)
Creat: 0.77 mg/dL (ref 0.50–1.05)
GFR, Est African American: 99 mL/min/{1.73_m2} (ref >=60)
GFR, Est Non African American: 86 mL/min/{1.73_m2} (ref >=60)
Globulin: 2.8 g/dL (calc) (ref 1.9–3.7)
Glucose, Bld: 87 mg/dL (ref 65–99)
Potassium: 4.4 mmol/L (ref 3.5–5.3)
Sodium: 141 mmol/L (ref 135–146)
Total Bilirubin: 0.3 mg/dL (ref 0.2–1.2)
Total Protein: 6.6 g/dL (ref 6.1–8.1)

## 2019-01-06 LAB — QUANTIFERON-TB GOLD PLUS
Mitogen-NIL: 7.3 IU/mL
NIL: 0.02 IU/mL
QuantiFERON-TB Gold Plus: NEGATIVE
TB1-NIL: 0.03 IU/mL
TB2-NIL: 0.01 IU/mL

## 2019-01-06 LAB — CBC WITH DIFFERENTIAL/PLATELET
Absolute Monocytes: 584 cells/uL (ref 200–950)
Basophils Absolute: 40 cells/uL (ref 0–200)
Basophils Relative: 0.5 %
Eosinophils Absolute: 376 cells/uL (ref 15–500)
Eosinophils Relative: 4.7 %
HCT: 34.2 % — ABNORMAL LOW (ref 35.0–45.0)
Hemoglobin: 10.9 g/dL — ABNORMAL LOW (ref 11.7–15.5)
Lymphs Abs: 2040 cells/uL (ref 850–3900)
MCH: 25.8 pg — ABNORMAL LOW (ref 27.0–33.0)
MCHC: 31.9 g/dL — ABNORMAL LOW (ref 32.0–36.0)
MCV: 80.9 fL (ref 80.0–100.0)
MPV: 9.6 fL (ref 7.5–12.5)
Monocytes Relative: 7.3 %
Neutro Abs: 4960 cells/uL (ref 1500–7800)
Neutrophils Relative %: 62 %
Platelets: 250 10*3/uL (ref 140–400)
RBC: 4.23 10*6/uL (ref 3.80–5.10)
RDW: 15.2 % — ABNORMAL HIGH (ref 11.0–15.0)
Total Lymphocyte: 25.5 %
WBC: 8 10*3/uL (ref 3.8–10.8)

## 2019-01-07 NOTE — Progress Notes (Signed)
TB gold is negative.

## 2019-01-07 NOTE — Progress Notes (Signed)
Anemia, rest stable.

## 2019-01-22 DIAGNOSIS — M791 Myalgia, unspecified site: Secondary | ICD-10-CM | POA: Diagnosis not present

## 2019-01-22 DIAGNOSIS — G518 Other disorders of facial nerve: Secondary | ICD-10-CM | POA: Diagnosis not present

## 2019-01-22 DIAGNOSIS — M542 Cervicalgia: Secondary | ICD-10-CM | POA: Diagnosis not present

## 2019-01-22 DIAGNOSIS — G43719 Chronic migraine without aura, intractable, without status migrainosus: Secondary | ICD-10-CM | POA: Diagnosis not present

## 2019-01-23 DIAGNOSIS — D649 Anemia, unspecified: Secondary | ICD-10-CM | POA: Diagnosis not present

## 2019-01-23 DIAGNOSIS — R7989 Other specified abnormal findings of blood chemistry: Secondary | ICD-10-CM | POA: Diagnosis not present

## 2019-01-24 ENCOUNTER — Telehealth: Payer: Self-pay | Admitting: Rheumatology

## 2019-01-24 MED ORDER — SECUKINUMAB 150 MG/ML ~~LOC~~ SOAJ: 150.0000 mg | SUBCUTANEOUS | 0 refills | Status: DC

## 2019-01-24 NOTE — Telephone Encounter (Signed)
Patient advised we will be faxing her prescription to [facility] today. Patient advised that she may come by the office for a sample so she will have it for her injection on Monday. Patient verbalized understanding.

## 2019-01-24 NOTE — Telephone Encounter (Signed)
Patient called stating she received a call from [employer] stating a new prescription is needed for Cosentyx.  Patient states she is due to take her next injection on Monday, 01/28/19.

## 2019-01-24 NOTE — Telephone Encounter (Signed)
Last Visit: 11/23/18 Next visit: 04/29/19 Labs: 01/04/19 Anemia, rest stable Tb Gold: 01/04/19 Neg   Okay to refill per Dr. Estanislado Pandy   Attempted to contact the patient and left message for patient to call the office.

## 2019-01-25 ENCOUNTER — Telehealth: Payer: Self-pay

## 2019-01-25 NOTE — Telephone Encounter (Signed)
   [facility] Pre-operative Risk Assessment    Request for surgical clearance:  1. What type of surgery is being performed? Left Wrist Stenosing  Tenosynovitis Release   2. When is this surgery scheduled? 02/13/19   3. What type of clearance is required (medical clearance vs. Pharmacy clearance to hold med vs. Both)? Medical  4. Are there any medications that need to be held prior to surgery and how long? Not specified    5. [facility]   6. What is your office phone number [phone]    7.   What is your office fax number [phone] ATTN: Cyndee Brightly  8.   Anesthesia type (None, local, MAC, general) ? Local   Meryl Crutch 01/25/2019, 1:11 PM  _________________________________________________________________   (provider comments below)

## 2019-01-28 ENCOUNTER — Encounter: Payer: Self-pay | Admitting: *Deleted

## 2019-01-28 NOTE — Telephone Encounter (Signed)
   Primary Cardiologist:Mihai Croitoru, MD  Chart reviewed as part of pre-operative protocol coverage. Because of [name]'s past medical history and time since last visit, he/she will require a follow-up visit in order to better assess preoperative cardiovascular risk.  Pre-op covering staff: - Please schedule appointment and call patient to inform them.  Please schedule a virtual visit with Dr. Sallyanne Kuster or an APP on his team. Surgery is 02/13/19.  - Please contact requesting surgeon's office via preferred method (i.e, phone, fax) to inform them of need for appointment prior to surgery.  If applicable, this message will also be routed to pharmacy pool and/or primary cardiologist for input on holding anticoagulant/antiplatelet agent as requested below so that this information is available at time of patient's appointment.   Sophia, PA  01/28/2019, 9:29 AM

## 2019-01-28 NOTE — Telephone Encounter (Signed)
Pt has been scheduled with Almyra Deforest, PA-C, 01/30/2019. Sent virtual consent to [employer], but pt also gave verbal    Virtual Visit Pre-Appointment Phone Call  "(Name), I am calling you today to discuss your upcoming appointment. We are currently trying to limit exposure to the virus that causes COVID-19 by seeing patients at home rather than in the office."  1. "What is the BEST phone number to call the day of the visit?" - include this in appointment notes  2. "Do you have or have access to (through a family member/friend) a smartphone with video capability that we can use for your visit?" a. If yes - list this number in appt notes as "cell" (if different from BEST phone #) and list the appointment type as a VIDEO visit in appointment notes b. If no - list the appointment type as a PHONE visit in appointment notes  3. Confirm consent - "In the setting of the current Covid19 crisis, you are scheduled for a (phone or video) visit with your provider on (date) at (time).  Just as we do with many in-office visits, in order for you to participate in this visit, we must obtain consent.  If you'd like, I can send this to your mychart (if signed up) or email for you to review.  Otherwise, I can obtain your verbal consent now.  All virtual visits are billed to your insurance company just like a normal visit would be.  By agreeing to a virtual visit, we'd like you to understand that the technology does not allow for your provider to perform an examination, and thus may limit your provider's ability to fully assess your condition. If your provider identifies any concerns that need to be evaluated in person, we will make arrangements to do so.  Finally, though the technology is pretty good, we cannot assure that it will always work on either your or our end, and in the setting of a video visit, we may have to convert it to a phone-only visit.  In either situation, we cannot ensure that we have a secure connection.   Are you willing to proceed?" STAFF: Did the patient verbally acknowledge consent to telehealth visit? Document YES/NO here: YES  4. Advise patient to be prepared - "Two hours prior to your appointment, go ahead and check your blood pressure, pulse, oxygen saturation, and your weight (if you have the equipment to check those) and write them all down. When your visit starts, your provider will ask you for this information. If you have an Apple Watch or Kardia device, please plan to have heart rate information ready on the day of your appointment. Please have a pen and paper handy nearby the day of the visit as well."  5. Give patient instructions for MyChart download to smartphone OR Doximity/Doxy.me as below if video visit (depending on what platform provider is using)  6. Inform patient they will receive a phone call 15 minutes prior to their appointment time (may be from unknown caller ID) so they should be prepared to answer    TELEPHONE CALL NOTE  [name] has been deemed a candidate for a follow-up tele-health visit to limit community exposure during the Covid-19 pandemic. I spoke with the patient via phone to ensure availability of phone/video source, confirm preferred email & phone number, and discuss instructions and expectations.  I reminded [name] to be prepared with any vital sign and/or heart rhythm information that could potentially be obtained via  home monitoring, at the time of her visit. I reminded [name] to expect a phone call prior to her visit.  Jeanann Lewandowsky, [place] 01/28/2019 9:54 AM   INSTRUCTIONS FOR DOWNLOADING THE MYCHART APP TO SMARTPHONE  - The patient must first make sure to have activated MyChart and know their login information - If Apple, go to [employer] and type in MyChart in the search bar and download the app. If Android, ask patient to go to [employer] and type in [place] in the search bar and download the app. The app is  free but as with any other app downloads, their phone may require them to verify saved payment information or Apple/Android password.  - The patient will need to then log into the app with their MyChart username and password, and select [facility] as their healthcare provider to link the account. When it is time for your visit, go to the MyChart app, find appointments, and click Begin Video Visit. Be sure to Select Allow for your device to access the Microphone and Camera for your visit. You will then be connected, and your provider will be with you shortly.  **If they have any issues connecting, or need assistance please contact MyChart service desk [phone])**  **If using a computer, in order to ensure the best quality for their visit they will need to use either of the following Internet Browsers: [employer], or Google Chrome**  IF USING DOXIMITY or DOXY.ME - The patient will receive a link just prior to their visit by text.     FULL LENGTH CONSENT FOR TELE-HEALTH VISIT   I hereby voluntarily request, consent and authorize [facility] and its employed or contracted physicians, physician assistants, nurse practitioners or other licensed health care professionals (the Practitioner), to provide me with telemedicine health care services (the "Services") as deemed necessary by the treating Practitioner. I acknowledge and consent to receive the Services by the Practitioner via telemedicine. I understand that the telemedicine visit will involve communicating with the Practitioner through live audiovisual communication technology and the disclosure of certain medical information by electronic transmission. I acknowledge that I have been given the opportunity to request an in-person assessment or other available alternative prior to the telemedicine visit and am voluntarily participating in the telemedicine visit.  I understand that I have the right to withhold or withdraw my  consent to the use of telemedicine in the course of my care at any time, without affecting my right to future care or treatment, and that the Practitioner or I may terminate the telemedicine visit at any time. I understand that I have the right to inspect all information obtained and/or recorded in the course of the telemedicine visit and may receive copies of available information for a reasonable fee.  I understand that some of the potential risks of receiving the Services via telemedicine include:  Marland Kitchen Delay or interruption in medical evaluation due to technological equipment failure or disruption; . Information transmitted may not be sufficient (e.g. poor resolution of images) to allow for appropriate medical decision making by the Practitioner; and/or  . In rare instances, security protocols could fail, causing a breach of personal health information.  Furthermore, I acknowledge that it is my responsibility to provide information about my medical history, conditions and care that is complete and accurate to the best of my ability. I acknowledge that Practitioner's advice, recommendations, and/or decision may be based on factors not within their control, such as incomplete  or inaccurate data provided by me or distortions of diagnostic images or specimens that may result from electronic transmissions. I understand that the practice of medicine is not an exact science and that Practitioner makes no warranties or guarantees regarding treatment outcomes. I acknowledge that I will receive a copy of this consent concurrently upon execution via email to the email address I last provided but may also request a printed copy by calling the office of [facility].    I understand that my insurance will be billed for this visit.   I have read or had this consent read to me. . I understand the contents of this consent, which adequately explains the benefits and risks of the Services being provided via telemedicine.   . I have been provided ample opportunity to ask questions regarding this consent and the Services and have had my questions answered to my satisfaction. . I give my informed consent for the services to be provided through the use of telemedicine in my medical care  By participating in this telemedicine visit I agree to the above.

## 2019-01-29 ENCOUNTER — Telehealth: Payer: Self-pay | Admitting: Physician Assistant

## 2019-01-29 NOTE — Telephone Encounter (Signed)
Smartphone/consent/ my chart/ pre reg completed °

## 2019-01-30 ENCOUNTER — Encounter: Payer: Self-pay | Admitting: Physician Assistant

## 2019-01-30 ENCOUNTER — Telehealth (INDEPENDENT_AMBULATORY_CARE_PROVIDER_SITE_OTHER): Payer: Medicare HMO | Admitting: Physician Assistant

## 2019-01-30 VITALS — BP 113/71 | HR 83 | Ht 62.0 in | Wt 269.8 lb

## 2019-01-30 DIAGNOSIS — E785 Hyperlipidemia, unspecified: Secondary | ICD-10-CM

## 2019-01-30 DIAGNOSIS — E119 Type 2 diabetes mellitus without complications: Secondary | ICD-10-CM

## 2019-01-30 DIAGNOSIS — I5032 Chronic diastolic (congestive) heart failure: Secondary | ICD-10-CM | POA: Diagnosis not present

## 2019-01-30 DIAGNOSIS — I10 Essential (primary) hypertension: Secondary | ICD-10-CM

## 2019-01-30 DIAGNOSIS — Z01810 Encounter for preprocedural cardiovascular examination: Secondary | ICD-10-CM | POA: Diagnosis not present

## 2019-01-30 NOTE — Progress Notes (Signed)
Virtual Visit via Video Note   This visit type was conducted due to national recommendations for restrictions regarding the COVID-19 Pandemic (e.g. social distancing) in an effort to limit this patient's exposure and mitigate transmission in our community.  Due to her co-morbid illnesses, this patient is at least at moderate risk for complications without adequate follow up.  This format is felt to be most appropriate for this patient at this time.  All issues noted in this document were discussed and addressed.  A limited physical exam was performed with this format.  Please refer to the patient's chart for her consent to telehealth for [facility].   Date:  01/30/2019   ID:  [name], DOB 18-Jun-1961, MRN [id number]  Patient Location: Home Provider Location: Home  PCP:  Shon Baton, MD  Cardiologist:  Sanda Klein, MD  Electrophysiologist:  None   Evaluation Performed:  Follow-Up Visit  Chief Complaint:  followup  History of Present Illness:    [name] is a 58 y.o. female with with past medical history of HTN, HLD, DM 2, asthma, former tobacco abuse, morbid obesity, OSA and chronic diastolic heart failure.  She has a history of ankylosing spondylitis.  Patient had a low risk Myoview in February 2017, this did show a small defect of mild severity present in the mid anteroseptal and apical septal location, this was partially reversed by artifact likely secondary to shifting breast attenuation however cannot rule out a very subtle area of ischemia.  Patient symptom was felt to be very atypical at the time, therefore no intervention was performed.  Patient was last seen by Dr. Sallyanne Kuster on 06/28/2018, she complained of some ankle edema at the time.  She increased her diuretic for 3 days at the time and also had an episode of sudden onset of weakness.  Work-up in the emergency room was reassuring.  It was suspected her symptom at the time was related to hypovolemia.  Her  blood pressure was low and the heart rate was fast at the time.  Repeat echocardiogram obtained on 07/10/2018 showed EF 60 to 65%, moderate LVH, grade 2 DD, mild MR.  Patient was contacted through [place] video conference visit.  Although she does have occasional chest discomfort, this is the same atypical chest pain she had a years ago and has not had any worsening symptoms.  Heart atypical chest discomfort does not occur with exertion and mainly occurs at rest.  Given her reassuring Myoview in 2017, I would not attempt to repeat a stress test given the atypical nature.  She does not do much climbing, however able to walk around without too much of an issue.  She is able to walk at least 2 blocks away from her residence and back up without any chest discomfort.  Given her morbid obesity, she would not be a good candidate for stress test.  Given lack of exertional chest discomfort, I would not recommend a invasive study either.  Overall, I think she is quite stable from cardiology perspective and can proceed with left wrist surgery without further work-up.  The patient does not have symptoms concerning for COVID-19 infection (fever, chills, cough, or new shortness of breath).    Past Medical History:  Diagnosis Date   Anemia    takes iron supplement   Anxiety    Asthma    daily and prn inhalers   Bone spur    Left foot   Chest pain    a. 10/2015:  NST showing possible mild ischemia but most consistent with breast attenuation. Low-risk study.    Chronic diastolic CHF (congestive heart failure) (HCC)    Common migraine with intractable migraine 07/06/2018   DDD (degenerative disc disease), cervical    Degenerative disc disease    Diabetes mellitus ([place])    Essential hypertension    Family history of early CAD    Former tobacco use    GERD (gastroesophageal reflux disease)    GERD (gastroesophageal reflux disease)    Headache(784.0)    migraine-like   History of TIA  (transient ischemic attack) early 2013   no weakness or deficits   History of UTI    Hyperlipidemia    Insomnia    Migraines    Morbid obesity ([place])    Partial tear of rotator cuff(726.13)    Psoriatic arthritis ([place])    Shoulder impingement 04/2012   left   Sleep apnea    uses CPAP nightly   Past Surgical History:  Procedure Laterality Date   ABDOMINAL HYSTERECTOMY  03/28/2002   complete   ANTERIOR CERVICAL DECOMP/DISCECTOMY FUSION  02/18/2010   C6-7   BREAST BIOPSY Right    DILATION AND CURETTAGE OF UTERUS     2000   FOOT SURGERY Right 2015   ROTATOR CUFF REPAIR Left 2013   TOTAL KNEE ARTHROPLASTY  09/07/2009   left   TOTAL KNEE ARTHROPLASTY  10/13/2008   right   TOTAL SHOULDER ARTHROPLASTY     UMBILICAL HERNIA REPAIR  03/28/2002     Current Meds  Medication Sig   acidophilus (RISAQUAD) CAPS Take 1 capsule by mouth every morning.    amoxicillin (AMOXIL) 500 MG capsule Take 800 mg by mouth as needed. Before dental procedures    aspirin 81 MG tablet Take 81 mg by mouth daily.    atorvastatin (LIPITOR) 20 MG tablet Take 20 mg by mouth every evening.    azelastine (ASTELIN) 0.1 % nasal spray Place 1 spray into both nostrils as needed for rhinitis. Use in each nostril as directed   baclofen (LIORESAL) 10 MG tablet Take 10-20 mg by mouth See admin instructions. Weekly as needed   Blood Glucose Monitoring Suppl (CONTOUR NEXT EZ) w/Device KIT by Does not apply route.   Calcium Carbonate Antacid (TUMS E-X PO) Take 1 tablet by mouth daily at 12 noon. Take 1 tablet at bedtime   chlorproMAZINE (THORAZINE) 25 MG tablet Take 25-50 mg by mouth See admin instructions. every 3 hours as needed max twice month   cyclobenzaprine (FLEXERIL) 10 MG tablet Take 10 mg by mouth 2 (two) times daily as needed for muscle spasms.   diclofenac sodium (VOLTAREN) 1 % GEL Apply three grams to three large joints up to three times daily (Patient taking differently: Apply 2 g  topically as needed. Apply to Knee, fingers, elbow and left shoulder)   DULoxetine (CYMBALTA) 60 MG capsule Take 60 mg by mouth daily.    ferrous sulfate 325 (65 FE) MG tablet Take 325 mg by mouth 2 (two) times a week.   furosemide (LASIX) 40 MG tablet TAKE 1 TABLET BY MOUTH DAILY (Patient taking differently: Take 20 mg by mouth as needed. )   HYDROcodone-acetaminophen (NORCO/VICODIN) 5-325 MG per tablet Take 1 tablet by mouth as needed for moderate pain.    irbesartan (AVAPRO) 75 MG tablet Take 37.5 mg by mouth. Take only if blood pressure above 130   loratadine (CLARITIN) 10 MG tablet Take 10 mg by mouth daily.  LORazepam (ATIVAN) 0.5 MG tablet Take 0.5 mg by mouth as needed for anxiety.    meloxicam (MOBIC) 15 MG tablet Take 15 mg by mouth daily.    metFORMIN (GLUCOPHAGE) 500 MG tablet Take 500 mg by mouth daily with breakfast.    methocarbamol (ROBAXIN) 500 MG tablet Take 500 mg by mouth every 8 (eight) hours as needed for muscle spasms.    mometasone (NASONEX) 50 MCG/ACT nasal spray Place 2 sprays into the nose as needed.   Multiple Vitamin (MULITIVITAMIN WITH MINERALS) TABS Take 1 tablet by mouth daily.   mupirocin nasal ointment (BACTROBAN) 2 % Place 1 application into the nose 2 (two) times daily. Use one-half of tube in each . After application, press sides of nose together and gently massage.   nitroGLYCERIN (NITRO-DUR) 0.2 mg/hr patch Place 1 patch (0.2 mg total) onto the skin daily. (Patient taking differently: Place 0.2 mg onto the skin as needed (pain). )   NON FORMULARY at bedtime. CPAP   Olopatadine HCl (PATADAY OP) Place 1 drop into both eyes daily at 12 noon.    omeprazole (PRILOSEC) 20 MG capsule Take 20 mg by mouth 2 (two) times daily.    ONE TOUCH ULTRA TEST test strip    ONETOUCH DELICA LANCETS 16X MISC    potassium chloride SA (K-DUR,KLOR-CON) 20 MEQ tablet Take 40 mEq by mouth 2 (two) times daily.    PROAIR HFA 108 (90 Base) MCG/ACT inhaler Inhale 2  puffs into the lungs as needed for shortness of breath.    QVAR REDIHALER 40 MCG/ACT inhaler Inhale 2 puffs into the lungs daily at 12 noon.    Secukinumab (COSENTYX SENSOREADY PEN) 150 MG/ML SOAJ Inject 150 mg into the skin every 28 (twenty-eight) days.   topiramate (TOPAMAX) 100 MG tablet Take 300 mg by mouth at bedtime.   vitamin B-12 (CYANOCOBALAMIN) 500 MCG tablet Take 500 mcg by mouth daily as needed.      Allergies:   Patient has no known allergies.   Social History   Tobacco Use   Smoking status: Former Smoker    Packs/day: 0.50    Years: 20.00    Pack years: 10.00    Types: Cigarettes    Last attempt to quit: 10/02/1999    Years since quitting: 19.3   Smokeless tobacco: Never Used  Substance Use Topics   Alcohol use: No   Drug use: No     Family Hx: The patient's family history includes Breast cancer in her sister and sister; Cancer in her maternal grandmother and paternal grandfather; Coronary artery disease in her sister; Diabetes in her father, maternal grandmother, mother, paternal grandfather, sister, and sister; Hypertension in her father, maternal grandmother, and mother; Kidney disease in her father.  ROS:   Please see the history of present illness.     All other systems reviewed and are negative.   Prior CV studies:   The following studies were reviewed today:  Myoview 11/03/2015 Study Highlights    Nuclear stress EF: 63%.  There was no ST segment deviation noted during stress.  There is a small defect of mild severity present in the mid anteroseptal and apical septal location. The defect is partially reversible. This is most likely secondary to shifting breast attenuation but cannot rule out a very subtle area of ischemia.  The left ventricular ejection fraction is normal (55-65%).    Echo 07/10/2018 LV EF: 60% -   65% Study Conclusions  - Left ventricle: The cavity size was normal.  Wall thickness was   increased in a pattern of mild  LVH. Systolic function was normal.   The estimated ejection fraction was in the range of 60% to 65%.   Wall motion was normal; there were no regional wall motion   abnormalities. Features are consistent with a pseudonormal left   ventricular filling pattern, with concomitant abnormal relaxation   and increased filling pressure (grade 2 diastolic dysfunction). - Mitral valve: There was mild regurgitation.  Labs/Other Tests and Data Reviewed:    EKG:  An ECG dated 06/27/2018 was personally reviewed today and demonstrated:  Normal sinus rhythm without significant ST-T wave changes.  Recent Labs: 01/04/2019: ALT 13; BUN 16; Creat 0.77; Hemoglobin 10.9; Platelets 250; Potassium 4.4; Sodium 141   Recent Lipid Panel Lab Results  Component Value Date/Time   CHOL 143 03/12/2013 02:00 AM   TRIG 128 03/12/2013 02:00 AM   HDL 42 03/12/2013 02:00 AM   CHOLHDL 3.4 03/12/2013 02:00 AM   LDLCALC 75 03/12/2013 02:00 AM    Wt Readings from Last 3 Encounters:  01/30/19 269 lb 12.8 oz (122.4 kg)  11/23/18 271 lb (122.9 kg)  10/18/18 272 lb 3.2 oz (123.5 kg)     Objective:    Vital Signs:  BP 113/71    Pulse 83    Ht _0  (1.575 m)    Wt 269 lb 12.8 oz (122.4 kg)    SpO2 99%    BMI 49.35 kg/m    VITAL SIGNS:  reviewed  ASSESSMENT & PLAN:    1. Preoperative clearance: Upcoming surgery on the left wrist by Dr. Burney Gauze.  Patient had a low risk Myoview in 2017.  Last echocardiogram in October 2019 showed normal EF.  She is able to walk 2 blocks away from her residence and back without any exertional chest pain.  She has a chronic history of atypical chest pain that is unchanged for the past several years.  Overall, she is a low risk patient and can proceed with low risk surgery without further work-up.  2. Hypertension: Blood pressure is under much better control after using blood pressure medication as needed for systolic blood pressure greater than 130.  She had some hypotension episodes last  year which has completely resolved.  3. Hyperlipidemia: Continue Lipitor  4. DM 2: Managed by primary care provider.  5. Morbid obesity: Weight loss recommended  6. Chronic diastolic heart failure: In the past 2 weeks, she has only used Lasix once.  She is on as needed dose of 20 mg Lasix for ankle swelling.  COVID-19 Education: The signs and symptoms of COVID-19 were discussed with the patient and how to seek care for testing (follow up with PCP or arrange E-visit).  The importance of social distancing was discussed today.  Time:   Today, I have spent 8 minutes with the patient with telehealth technology discussing the above problems.     Medication Adjustments/Labs and Tests Ordered: Current medicines are reviewed at length with the patient today.  Concerns regarding medicines are outlined above.   Tests Ordered: No orders of the defined types were placed in this encounter.   Medication Changes: No orders of the defined types were placed in this encounter.   Disposition:  Follow up in 6 month(s)  Signed, Almyra Deforest, [place]  01/30/2019 9:33 AM    [place]

## 2019-01-30 NOTE — Patient Instructions (Addendum)
Medication Instructions:   Your physician recommends that you continue on your current medications as directed. Please refer to the Current Medication list given to you today.  If you need a refill on your cardiac medications before your next appointment, please call your pharmacy.   Lab work:  NONE ordered at this time   If you have labs (blood work) drawn today and your tests are completely normal, you will receive your results only by: Marland Kitchen MyChart Message (if you have MyChart) OR . A paper copy in the mail If you have any lab test that is abnormal or we need to change your treatment, we will call you to review the results.  Testing/Procedures:  NONE ordered at this time of appointment   Follow-Up: At [facility], you and your health needs are our priority.  As part of our continuing mission to provide you with exceptional heart care, we have created designated Provider Care Teams.  These Care Teams include your primary Cardiologist (physician) and Advanced Practice Providers (APPs -  Physician Assistants and Nurse Practitioners) who all work together to provide you with the care you need, when you need it. You will need a follow up appointment in 6 months.  Please call our office 2 months in advance to schedule this appointment.  You may see Sanda Klein, MD or one of the following Advanced Practice Providers on your designated Care Team: [facility] [place], [place] . Fabian Sharp, PA-C  Any Other Special Instructions Will Be Listed Below (If Applicable).  Cleared for surgery

## 2019-02-05 DIAGNOSIS — D649 Anemia, unspecified: Secondary | ICD-10-CM | POA: Diagnosis not present

## 2019-02-05 DIAGNOSIS — I129 Hypertensive chronic kidney disease with stage 1 through stage 4 chronic kidney disease, or unspecified chronic kidney disease: Secondary | ICD-10-CM | POA: Diagnosis not present

## 2019-02-05 DIAGNOSIS — M25532 Pain in left wrist: Secondary | ICD-10-CM | POA: Diagnosis not present

## 2019-02-05 DIAGNOSIS — R69 Illness, unspecified: Secondary | ICD-10-CM | POA: Diagnosis not present

## 2019-02-05 DIAGNOSIS — N182 Chronic kidney disease, stage 2 (mild): Secondary | ICD-10-CM | POA: Diagnosis not present

## 2019-02-05 DIAGNOSIS — E119 Type 2 diabetes mellitus without complications: Secondary | ICD-10-CM | POA: Diagnosis not present

## 2019-02-05 DIAGNOSIS — I5189 Other ill-defined heart diseases: Secondary | ICD-10-CM | POA: Diagnosis not present

## 2019-02-13 DIAGNOSIS — M654 Radial styloid tenosynovitis [de Quervain]: Secondary | ICD-10-CM | POA: Diagnosis not present

## 2019-03-08 ENCOUNTER — Other Ambulatory Visit: Payer: Self-pay | Admitting: Internal Medicine

## 2019-03-08 DIAGNOSIS — Z1231 Encounter for screening mammogram for malignant neoplasm of breast: Secondary | ICD-10-CM

## 2019-03-22 ENCOUNTER — Other Ambulatory Visit: Payer: Self-pay

## 2019-03-22 DIAGNOSIS — Z79899 Other long term (current) drug therapy: Secondary | ICD-10-CM | POA: Diagnosis not present

## 2019-03-23 LAB — CBC WITH DIFFERENTIAL/PLATELET
Absolute Monocytes: 666 cells/uL (ref 200–950)
Basophils Absolute: 36 cells/uL (ref 0–200)
Basophils Relative: 0.4 %
Eosinophils Absolute: 243 cells/uL (ref 15–500)
Eosinophils Relative: 2.7 %
HCT: 38.3 % (ref 35.0–45.0)
Hemoglobin: 12 g/dL (ref 11.7–15.5)
Lymphs Abs: 2025 cells/uL (ref 850–3900)
MCH: 25.3 pg — ABNORMAL LOW (ref 27.0–33.0)
MCHC: 31.3 g/dL — ABNORMAL LOW (ref 32.0–36.0)
MCV: 80.8 fL (ref 80.0–100.0)
MPV: 9.9 fL (ref 7.5–12.5)
Monocytes Relative: 7.4 %
Neutro Abs: 6030 cells/uL (ref 1500–7800)
Neutrophils Relative %: 67 %
Platelets: 249 10*3/uL (ref 140–400)
RBC: 4.74 10*6/uL (ref 3.80–5.10)
RDW: 15.1 % — ABNORMAL HIGH (ref 11.0–15.0)
Total Lymphocyte: 22.5 %
WBC: 9 10*3/uL (ref 3.8–10.8)

## 2019-03-23 LAB — COMPLETE METABOLIC PANEL WITH GFR
AG Ratio: 1.5 (calc) (ref 1.0–2.5)
ALT: 14 U/L (ref 6–29)
AST: 13 U/L (ref 10–35)
Albumin: 4 g/dL (ref 3.6–5.1)
Alkaline phosphatase (APISO): 113 U/L (ref 37–153)
BUN: 11 mg/dL (ref 7–25)
CO2: 22 mmol/L (ref 20–32)
Calcium: 9.5 mg/dL (ref 8.6–10.4)
Chloride: 110 mmol/L (ref 98–110)
Creat: 0.79 mg/dL (ref 0.50–1.05)
GFR, Est African American: 96 mL/min/{1.73_m2} (ref >=60)
GFR, Est Non African American: 83 mL/min/{1.73_m2} (ref >=60)
Globulin: 2.7 g/dL (calc) (ref 1.9–3.7)
Glucose, Bld: 89 mg/dL (ref 65–99)
Potassium: 4.3 mmol/L (ref 3.5–5.3)
Sodium: 140 mmol/L (ref 135–146)
Total Bilirubin: 0.4 mg/dL (ref 0.2–1.2)
Total Protein: 6.7 g/dL (ref 6.1–8.1)

## 2019-03-25 DIAGNOSIS — N390 Urinary tract infection, site not specified: Secondary | ICD-10-CM | POA: Diagnosis not present

## 2019-03-25 DIAGNOSIS — R109 Unspecified abdominal pain: Secondary | ICD-10-CM | POA: Diagnosis not present

## 2019-03-27 ENCOUNTER — Encounter: Payer: Self-pay | Admitting: Gastroenterology

## 2019-03-27 ENCOUNTER — Ambulatory Visit (INDEPENDENT_AMBULATORY_CARE_PROVIDER_SITE_OTHER): Payer: Medicare HMO | Admitting: Gastroenterology

## 2019-03-27 VITALS — Ht 62.0 in | Wt 266.0 lb

## 2019-03-27 DIAGNOSIS — D509 Iron deficiency anemia, unspecified: Secondary | ICD-10-CM | POA: Diagnosis not present

## 2019-03-27 NOTE — Progress Notes (Signed)
Review of pertinent gastrointestinal problems: 1.  Routine risk for colon cancer.  Colonoscopy Dr. Ardis Hughs January 2013 found a single subcentimeter polyp.  It was not precancerous.  She was recommended to have repeat colon cancer screening at 10-year interval.   This service was provided via virtual visit.   Only audio was used.  The patient was located at home.  I was located in my office.  The patient did consent to this virtual visit and is aware of possible charges through their insurance for this visit.  The patient is an established patient.  My certified medical assistant, Grace Bushy, contributed to this visit by contacting the patient by phone 1 or 2 business days prior to the appointment and also followed up on the recommendations I made after the visit.  Time spent on virtual visit: 22 minutes   HPI: This is a very pleasant 58 year old woman  She saw her primary care physician about 2 months ago for routine follow-up.  It looks like she has been taking iron twice weekly leading to this appointment.  Her hemoglobin January 2020 was 12.1, her MCV was 86.  Her hemoglobin Jan 23, 2019 was 11.7, her MCV was 84.  Her hemoglobin Feb 05, 2019 was 11.1, her MCV was 84.  Jan 23, 2019 ferritin was normal at 63.  Total iron 35, iron saturation 14%.  Blood work available in epic March 22, 2019 shows normal CBC, Hb12.0, normal complete metabolic profile.  She takes meloxicam daily.  She's been on iron pills for many years.  She was taking one pill every day for a long time (many years actually).  About a year ago she cut back to TIW dosing.    She has not overt bleeding.  Overall her weight is overall stable.  No trouble eating or with her bowels.  Her BMI is 47 based on office note from her primary care physician May 2020  Chief complaint is incidental mild anemia ROS: complete GI ROS as described in HPI, all other review negative.  Constitutional:  No unintentional weight  loss   Past Medical History:  Diagnosis Date  . Anemia    takes iron supplement  . Anxiety   . Asthma    daily and prn inhalers  . Bone spur    Left foot  . Chest pain    a. 10/2015: NST showing possible mild ischemia but most consistent with breast attenuation. Low-risk study.   . Chronic diastolic CHF (congestive heart failure) ([place])   . Common migraine with intractable migraine 07/06/2018  . DDD (degenerative disc disease), cervical   . Degenerative disc disease   . Diabetes mellitus ([place])   . Essential hypertension   . Family history of early CAD   . Former tobacco use   . GERD (gastroesophageal reflux disease)   . GERD (gastroesophageal reflux disease)   . Headache(784.0)    migraine-like  . History of TIA (transient ischemic attack) early 2013   no weakness or deficits  . History of UTI   . Hyperlipidemia   . Insomnia   . Migraines   . Morbid obesity ([place])   . Partial tear of rotator cuff(726.13)   . Psoriatic arthritis ([place])   . Shoulder impingement 04/2012   left  . Sleep apnea    uses CPAP nightly    Past Surgical History:  Procedure Laterality Date  . ABDOMINAL HYSTERECTOMY  03/28/2002   complete  . ANTERIOR CERVICAL DECOMP/DISCECTOMY FUSION  02/18/2010   C6-7  .  BREAST BIOPSY Right   . DILATION AND CURETTAGE OF UTERUS     2000  . FOOT SURGERY Right 2015  . ROTATOR CUFF REPAIR Left 2013  . TOTAL KNEE ARTHROPLASTY  09/07/2009   left  . TOTAL KNEE ARTHROPLASTY  10/13/2008   right  . TOTAL SHOULDER ARTHROPLASTY    . UMBILICAL HERNIA REPAIR  03/28/2002  . WRIST SURGERY  2020    Current Outpatient Medications  Medication Sig Dispense Refill  . acidophilus (RISAQUAD) CAPS Take 1 capsule by mouth every morning.     Marland Kitchen amoxicillin (AMOXIL) 500 MG capsule Take 800 mg by mouth as needed. Before dental procedures     . aspirin 81 MG tablet Take 81 mg by mouth daily.     Marland Kitchen atorvastatin (LIPITOR) 20 MG tablet Take 20 mg by mouth every evening.     Marland Kitchen azelastine  (ASTELIN) 0.1 % nasal spray Place 1 spray into both nostrils as needed for rhinitis. Use in each nostril as directed    . baclofen (LIORESAL) 10 MG tablet Take 10-20 mg by mouth See admin instructions. Weekly as needed    . Blood Glucose Monitoring Suppl (CONTOUR NEXT EZ) w/Device KIT by Does not apply route.    . chlorproMAZINE (THORAZINE) 25 MG tablet Take 25-50 mg by mouth See admin instructions. every 3 hours as needed max twice month  1  . cyclobenzaprine (FLEXERIL) 10 MG tablet Take 10 mg by mouth 2 (two) times daily as needed for muscle spasms.    . diclofenac sodium (VOLTAREN) 1 % GEL Apply three grams to three large joints up to three times daily (Patient taking differently: Apply 2 g topically as needed. Apply to Knee, fingers, elbow and left shoulder) 3 Tube 3  . DULoxetine (CYMBALTA) 60 MG capsule Take 60 mg by mouth daily.   3  . ferrous sulfate 325 (65 FE) MG tablet Take 325 mg by mouth 2 (two) times a week.    . furosemide (LASIX) 40 MG tablet TAKE 1 TABLET BY MOUTH DAILY (Patient taking differently: Take 20 mg by mouth as needed. ) 90 tablet 3  . HYDROcodone-acetaminophen (NORCO/VICODIN) 5-325 MG per tablet Take 1 tablet by mouth as needed for moderate pain.     Marland Kitchen irbesartan (AVAPRO) 75 MG tablet Take 37.5 mg by mouth. Take only if blood pressure above 130    . loratadine (CLARITIN) 10 MG tablet Take 10 mg by mouth daily.     Marland Kitchen LORazepam (ATIVAN) 0.5 MG tablet Take 0.5 mg by mouth as needed for anxiety.     . meloxicam (MOBIC) 15 MG tablet Take 15 mg by mouth daily.   3  . metFORMIN (GLUCOPHAGE) 500 MG tablet Take 500 mg by mouth daily with breakfast.     . methocarbamol (ROBAXIN) 500 MG tablet Take 500 mg by mouth every 8 (eight) hours as needed for muscle spasms.   0  . mometasone (NASONEX) 50 MCG/ACT nasal spray Place 2 sprays into the nose as needed.    . Multiple Vitamin (MULITIVITAMIN WITH MINERALS) TABS Take 1 tablet by mouth daily.    . mupirocin nasal ointment (BACTROBAN) 2  % Place 1 application into the nose 2 (two) times daily. Use one-half of tube in each . After application, press sides of nose together and gently massage.    . nitrofurantoin (MACRODANTIN) 100 MG capsule Take 100 mg by mouth 2 (two) times daily.    . nitroGLYCERIN (NITRO-DUR) 0.2 mg/hr patch Place 1 patch (0.2  mg total) onto the skin daily. (Patient taking differently: Place 0.2 mg onto the skin as needed (pain). ) 30 patch 12  . NON FORMULARY at bedtime. CPAP    . Olopatadine HCl (PATADAY OP) Place 1 drop into both eyes daily at 12 noon.     Marland Kitchen omeprazole (PRILOSEC) 20 MG capsule Take 20 mg by mouth 2 (two) times daily.     . ONE TOUCH ULTRA TEST test strip     . ONETOUCH DELICA LANCETS 70W MISC     . potassium chloride SA (K-DUR,KLOR-CON) 20 MEQ tablet Take 40 mEq by mouth 2 (two) times daily.   3  . PROAIR HFA 108 (90 Base) MCG/ACT inhaler Inhale 2 puffs into the lungs as needed for shortness of breath.   0  . QVAR REDIHALER 40 MCG/ACT inhaler Inhale 2 puffs into the lungs daily at 12 noon.   5  . Secukinumab (COSENTYX SENSOREADY PEN) 150 MG/ML SOAJ Inject 150 mg into the skin every 28 (twenty-eight) days. 3 pen 0  . topiramate (TOPAMAX) 100 MG tablet Take 300 mg by mouth at bedtime.    . vitamin B-12 (CYANOCOBALAMIN) 500 MCG tablet Take 500 mcg by mouth daily as needed.      No current facility-administered medications for this visit.     Allergies as of 03/27/2019  . (No Known Allergies)    Family History  Problem Relation Age of Onset  . Diabetes Mother   . Hypertension Mother   . Kidney disease Father   . Hypertension Father   . Diabetes Father   . Breast cancer Sister   . Diabetes Sister   . Breast cancer Sister   . Cancer Maternal Grandmother   . Diabetes Maternal Grandmother   . Hypertension Maternal Grandmother   . Diabetes Paternal Grandfather   . Cancer Paternal Grandfather   . Coronary artery disease Sister   . Diabetes Sister     Social History    Socioeconomic History  . Marital status: Married    Spouse name: Jenny Reichmann  . Number of children: 5  . Years of education: some college  . Highest education level: Not on file  Occupational History  . Occupation: Disabled  Social Needs  . Financial resource strain: Not on file  . Food insecurity    Worry: Not on file    Inability: Not on file  . Transportation needs    Medical: Not on file    Non-medical: Not on file  Tobacco Use  . Smoking status: Former Smoker    Packs/day: 0.50    Years: 20.00    Pack years: 10.00    Types: Cigarettes    Quit date: 10/02/1999    Years since quitting: 19.4  . Smokeless tobacco: Never Used  Substance and Sexual Activity  . Alcohol use: No  . Drug use: No  . Sexual activity: Yes    Birth control/protection: None  Lifestyle  . Physical activity    Days per week: Not on file    Minutes per session: Not on file  . Stress: Not on file  Relationships  . Social Herbalist on phone: Not on file    Gets together: Not on file    Attends religious service: Not on file    Active member of club or organization: Not on file    Attends meetings of clubs or organizations: Not on file    Relationship status: Not on file  . Intimate partner violence  Fear of current or ex partner: Not on file    Emotionally abused: Not on file    Physically abused: Not on file    Forced sexual activity: Not on file  Other Topics Concern  . Not on file  Social History Narrative   Lives with husband, Jenny Reichmann   Caffeine use: 1 cup coffee every morning   Right handed      Physical Exam: Unable to perform because this was a "telemed visit" due to current Covid-19 pandemic  Assessment and plan: 58 y.o. female with very mild anemia, slightly low iron indices despite iron supplement 2 or 3 times weekly  She tells me that she has been on iron supplements for many many years.  She used to take 1 iron pill every single day.  More recently she is tapered off  and her blood counts have dropped slightly.  She is in the upper 11s, low 12.0.  Most recent labs in the epic system show hemoglobin of 12.0 on March 22, 2019.  She has no overt bleeding.  She is not losing weight.  She has no abdominal pains or changes in her bowels.  I am not sure that her mild anemia, iron deficiency is related to any GI blood loss.  To be safe I am sending out 2 sets of Hemoccult home tests.  She will complete these and turned them back in.  If any are positive for microscopic blood then she will likely need a colonoscopy and probably also upper endoscopy.  Please see the "Patient Instructions" section for addition details about the plan.  Owens Loffler, MD [facility] 03/27/2019, 3:23 PM

## 2019-03-27 NOTE — Patient Instructions (Addendum)
   We will coordinate with her to arrange 2 sets of home stool cards to be completed and sent back in.  If any of these show that she has microscopic blood in her stool then she would need further testing, colonoscopy and possibly upper endoscopy as well.  Thank you for entrusting me with your care and choosing [facility].  Dr Ardis Hughs

## 2019-03-29 DIAGNOSIS — R69 Illness, unspecified: Secondary | ICD-10-CM | POA: Diagnosis not present

## 2019-04-08 ENCOUNTER — Other Ambulatory Visit: Payer: Medicare HMO

## 2019-04-08 DIAGNOSIS — D509 Iron deficiency anemia, unspecified: Secondary | ICD-10-CM

## 2019-04-08 LAB — HEMOCCULT SLIDES (X 3 CARDS)
Fecal Occult Blood: NEGATIVE
OCCULT 1: NEGATIVE
OCCULT 2: NEGATIVE
OCCULT 3: NEGATIVE
OCCULT 4: NEGATIVE
OCCULT 5: NEGATIVE

## 2019-04-09 DIAGNOSIS — G43719 Chronic migraine without aura, intractable, without status migrainosus: Secondary | ICD-10-CM | POA: Diagnosis not present

## 2019-04-09 DIAGNOSIS — G518 Other disorders of facial nerve: Secondary | ICD-10-CM | POA: Diagnosis not present

## 2019-04-09 DIAGNOSIS — M791 Myalgia, unspecified site: Secondary | ICD-10-CM | POA: Diagnosis not present

## 2019-04-09 DIAGNOSIS — M542 Cervicalgia: Secondary | ICD-10-CM | POA: Diagnosis not present

## 2019-04-16 NOTE — Progress Notes (Signed)
Office Visit Note  Patient: [name]             Date of Birth: 08-18-1961           MRN: [id number]             PCP: Shon Baton, MD Referring: Shon Baton, MD Visit Date: 04/29/2019 Occupation: '@GUAROCC' @  Subjective:  Right ankle joint pain     History of Present Illness: [name] is a 58 y.o. female with history of spondyloarthropathy.  She is on Cosentyx 150 mg sq injections every 28 days. Her most recent injection was today.  She experiences breakthrough pain leading up to her next injections. She denies any SI joint pain or lower back pain at this time. She has been experiencing intermittent right ankle joint pain and swelling.  She has also had discomfort in the left hand but denies any joint swelling.  She has difficulty gripping silverware at times. She states both knee replacements are doing well.  She states her right knee joint occasionally buckles.  She states that the medial epicondylitis of both elbows has resolved.   Activities of Daily Living:  Patient reports morning stiffness for 10-15 minutes.   Patient Reports nocturnal pain.  Difficulty dressing/grooming: Denies Difficulty climbing stairs: Denies Difficulty getting out of chair: Reports Difficulty using hands for taps, buttons, cutlery, and/or writing: Denies  Review of Systems  Constitutional: Negative for fatigue.  HENT: Positive for mouth dryness. Negative for mouth sores and nose dryness.   Eyes: Positive for dryness. Negative for pain and visual disturbance.  Respiratory: Negative for cough, hemoptysis, shortness of breath and difficulty breathing.   Cardiovascular: Negative for chest pain, palpitations, hypertension and swelling in legs/feet.  Gastrointestinal: Negative for blood in stool, constipation and diarrhea.  Endocrine: Negative for increased urination.  Genitourinary: Negative for painful urination.  Musculoskeletal: Positive for arthralgias, joint pain, joint  swelling and morning stiffness. Negative for myalgias, muscle weakness, muscle tenderness and myalgias.  Skin: Negative for color change, pallor, rash, hair loss, nodules/bumps, skin tightness, ulcers and sensitivity to sunlight.  Allergic/Immunologic: Negative for susceptible to infections.  Neurological: Positive for headaches (hx of migraines). Negative for dizziness, numbness and weakness.  Hematological: Negative for swollen glands.  Psychiatric/Behavioral: Positive for sleep disturbance. Negative for depressed mood. The patient is not nervous/anxious.     PMFS History:  Patient Active Problem List   Diagnosis Date Noted  . Vertigo 10/18/2018  . Common migraine with intractable migraine 07/06/2018  . Diabetes mellitus type 2 in obese ([place]) 06/29/2018  . Spondyloarthropathy 07/26/2016  . High risk medication use 07/26/2016  . Chronic low back pain 07/26/2016  . H/O total knee replacement, bilateral 07/26/2016  . Chronic diastolic heart failure ([place]) 11/29/2015  . Dyspnea 06/03/2015  . Lung nodule seen on imaging study, CTA of chest, 6 mm nodule, followed by Dr. Virgina Jock 03/25/2013  . Family history of coronary artery disease 03/12/2013  . Super obese 03/12/2013  . Chest pain with low risk of acute coronary syndrome - most likely musculoskeletal, negative nuc study 03/11/2013  . Full thickness rotator cuff tear 05/15/2012  . Hypercholesterolemia   . DJD (degenerative joint disease), cervical   . OSA (obstructive sleep apnea)   . Essential hypertension   . History of TIA (transient ischemic attack) 9/13   . Partial tear of rotator cuff(726.13)   . Shoulder impingement 04/12/2012    Past Medical History:  Diagnosis Date  . Anemia    takes  iron supplement  . Anxiety   . Asthma    daily and prn inhalers  . Bone spur    Left foot  . Chest pain    a. 10/2015: NST showing possible mild ischemia but most consistent with breast attenuation. Low-risk study.   . Chronic diastolic CHF  (congestive heart failure) ([place])   . Common migraine with intractable migraine 07/06/2018  . DDD (degenerative disc disease), cervical   . Degenerative disc disease   . Diabetes mellitus ([place])   . Essential hypertension   . Family history of early CAD   . Former tobacco use   . GERD (gastroesophageal reflux disease)   . GERD (gastroesophageal reflux disease)   . Headache(784.0)    migraine-like  . History of TIA (transient ischemic attack) early 2013   no weakness or deficits  . History of UTI   . Hyperlipidemia   . Insomnia   . Migraines   . Morbid obesity ([place])   . Partial tear of rotator cuff(726.13)   . Psoriatic arthritis ([place])   . Shoulder impingement 04/2012   left  . Sleep apnea    uses CPAP nightly    Family History  Problem Relation Age of Onset  . Diabetes Mother   . Hypertension Mother   . Kidney disease Father   . Hypertension Father   . Diabetes Father   . Breast cancer Sister   . Diabetes Sister   . Breast cancer Sister   . Cancer Maternal Grandmother   . Diabetes Maternal Grandmother   . Hypertension Maternal Grandmother   . Diabetes Paternal Grandfather   . Cancer Paternal Grandfather   . Coronary artery disease Sister   . Diabetes Sister    Past Surgical History:  Procedure Laterality Date  . ABDOMINAL HYSTERECTOMY  03/28/2002   complete  . ANTERIOR CERVICAL DECOMP/DISCECTOMY FUSION  02/18/2010   C6-7  . BREAST BIOPSY Right   . DILATION AND CURETTAGE OF UTERUS     2000  . FOOT SURGERY Right 2015  . ROTATOR CUFF REPAIR Left 2013  . TOTAL KNEE ARTHROPLASTY  09/07/2009   left  . TOTAL KNEE ARTHROPLASTY  10/13/2008   right  . TOTAL SHOULDER ARTHROPLASTY    . UMBILICAL HERNIA REPAIR  03/28/2002  . WRIST SURGERY  2020   Social History   Social History Narrative   Lives with husband, [name]   Caffeine use: 1 cup coffee every morning   Right handed    Immunization History  Administered Date(s) Administered  . Influenza-Unspecified 06/05/2018      Objective: Vital Signs: BP (!) 146/87 (BP Location: Left Wrist, Patient Position: Sitting, Cuff Size: Normal)   Pulse 89   Resp 14   Ht '5\' 1"'  (1.549 m)   Wt 274 lb (124.3 kg)   BMI 51.77 kg/m    Physical Exam Vitals signs and nursing note reviewed.  Constitutional:      Appearance: She is well-developed.  HENT:     Head: Normocephalic and atraumatic.  Eyes:     Conjunctiva/sclera: Conjunctivae normal.  Neck:     Musculoskeletal: Normal range of motion.  Cardiovascular:     Rate and Rhythm: Normal rate and regular rhythm.     Heart sounds: Normal heart sounds.  Pulmonary:     Effort: Pulmonary effort is normal.     Breath sounds: Normal breath sounds.  Abdominal:     General: Bowel sounds are normal.     Palpations: Abdomen is soft.  Lymphadenopathy:  Cervical: No cervical adenopathy.  Skin:    General: Skin is warm and dry.     Capillary Refill: Capillary refill takes less than 2 seconds.  Neurological:     Mental Status: She is alert and oriented to person, place, and time.  Psychiatric:        Behavior: Behavior normal.      Musculoskeletal Exam: C-spine, thoracic spine, and lumbar spine have good ROM.  No midline spinal tenderness.  No SI joint tenderness.  Shoulder joints, elbow joints, wrist joints, MCPs, PIPs, and DIPs good ROM.  She has tenderness and synovitis of the right 2nd MCP and PIP joint.  Hip joints have good ROM with no discomfort.  Knee replacements have good ROM with no warmth or effusion.  Right ankle joint tenderness and synovitis noted.  Left ankle joint has good ROM with no tenderness or inflammation.   CDAI Exam: CDAI Score: - Patient Global: -; Provider Global: - Swollen: 3 ; Tender: 3  Joint Exam      Right  Left  MCP 2     Swollen Tender  PIP 2     Swollen Tender  Ankle  Swollen Tender        Investigation: No additional findings.  Imaging: Mm 3d Screen Breast Bilateral  Result Date: 04/23/2019 CLINICAL DATA:  Screening.  EXAM: DIGITAL SCREENING BILATERAL MAMMOGRAM WITH TOMO AND CAD COMPARISON:  Previous exam(s). ACR Breast Density Category c: The breast tissue is heterogeneously dense, which may obscure small masses. FINDINGS: There are no findings suspicious for malignancy. Images were processed with CAD. IMPRESSION: No mammographic evidence of malignancy. A result letter of this screening mammogram will be mailed directly to the patient. RECOMMENDATION: Screening mammogram in one year. (Code:SM-B-01Y) BI-RADS CATEGORY  1: Negative. Electronically Signed   By: Audie Pinto [name]   On: 04/23/2019 14:29    Recent Labs: Lab Results  Component Value Date   WBC 9.0 03/22/2019   HGB 12.0 03/22/2019   PLT 249 03/22/2019   NA 140 03/22/2019   K 4.3 03/22/2019   CL 110 03/22/2019   CO2 22 03/22/2019   GLUCOSE 89 03/22/2019   BUN 11 03/22/2019   CREATININE 0.79 03/22/2019   BILITOT 0.4 03/22/2019   ALKPHOS 119 06/27/2018   AST 13 03/22/2019   ALT 14 03/22/2019   PROT 6.7 03/22/2019   ALBUMIN 3.6 06/27/2018   CALCIUM 9.5 03/22/2019   GFRAA 96 03/22/2019   QFTBGOLDPLUS NEGATIVE 01/04/2019    Speciality Comments: No specialty comments available.  Procedures:  No procedures performed Allergies: Patient has no known allergies.   Assessment / Plan:     Visit Diagnoses: Spondyloarthropathy - MRI positive for sacroiliitis, HLA-B27 negative, elevated ESR: She presents today with synovitis and tenderness of the right 2nd MCP, PIP joint, and the right ankle joint.  She has no SI joint tenderness at this time. She has no other joint pain or joint swelling.  She continues to have morning stiffness for 10-15 minutes.  She has been injecting Cosentyx 150 mg sq once every 28 days.  We discussed increasing the dose of Cosentyx to 300 mg every 28 days.  She injected 150 mg subcutaneously this morning and she was provided a sample of cosentyx to inject an additional 150 mg today.  She was advised to notify us if she  continues to have recurrent flares.  She will follow up in 5 months.   High risk medication use - Cosentyx 300 mg every 28 days.  Last TB gold negative on 01/04/2019 and will monitor yearly.  Most recent CMP within normal limits and CBC stable on 03/22/2019.  Due for CBC/CMP in October and will monitor every 3 months.  Standing orders are in place.  She received a flu vaccine in September. We discussed the importance of holding Cosentyx if she develops any signs or symptoms of an infection and to resume once the infection has cleared.   Medial epicondylitis of both elbows - Resolved   DDD (degenerative disc disease), cervical - She has good ROM with no discomfort.  She has no symptoms of radiculopathy at this time.   Arthropathy of lumbar facet joint - She has no midline spinal tenderness.    H/O total knee replacement, bilateral -Doing well. She has good ROM with no warmth or effusion.  She has occasional discomfort and buckling of the right knee joint.    Other medical conditions are listed as follows:   Lung nodule seen on imaging study, CTA of chest, 6 mm nodule, followed by Dr. Virgina Jock   History of diabetes mellitus   Chronic diastolic heart failure ([place])   History of hyperlipidemia   History of TIA (transient ischemic attack)   History of hypertension   History of sleep apnea   Orders: No orders of the defined types were placed in this encounter.  No orders of the defined types were placed in this encounter.   Face-to-face time spent with patient was 30 minutes. Greater than 50% of time was spent in counseling and coordination of care.  Follow-Up Instructions: Return in about 5 months (around 09/29/2019) for Spondyloarthropathy.   Ofilia Neas, PA-C  I examined and evaluated the patient with Hazel Sams PA.  On my examination today she had synovitis in multiple joints as described above.  We decided to increase her dose of Cosentyx as described above.  The plan of care was  discussed as noted above.  Bo Merino, MD  Note - This record has been created using Editor, commissioning.  Chart creation errors have been sought, but may not always  have been located. Such creation errors do not reflect on  the standard of medical care.

## 2019-04-23 ENCOUNTER — Other Ambulatory Visit: Payer: Self-pay

## 2019-04-23 ENCOUNTER — Ambulatory Visit
Admission: RE | Admit: 2019-04-23 | Discharge: 2019-04-23 | Disposition: A | Discharge status: Home / Self Care | Payer: Medicare HMO | Source: Ambulatory Visit | Attending: Internal Medicine | Admitting: Internal Medicine

## 2019-04-23 DIAGNOSIS — Z1231 Encounter for screening mammogram for malignant neoplasm of breast: Secondary | ICD-10-CM | POA: Diagnosis not present

## 2019-04-29 ENCOUNTER — Encounter: Payer: Self-pay | Admitting: Physician Assistant

## 2019-04-29 ENCOUNTER — Ambulatory Visit (INDEPENDENT_AMBULATORY_CARE_PROVIDER_SITE_OTHER): Payer: Medicare HMO | Admitting: Physician Assistant

## 2019-04-29 ENCOUNTER — Other Ambulatory Visit: Payer: Self-pay

## 2019-04-29 VITALS — BP 146/87 | HR 89 | Resp 14 | Ht 61.0 in | Wt 274.0 lb

## 2019-04-29 DIAGNOSIS — M47816 Spondylosis without myelopathy or radiculopathy, lumbar region: Secondary | ICD-10-CM | POA: Diagnosis not present

## 2019-04-29 DIAGNOSIS — I5032 Chronic diastolic (congestive) heart failure: Secondary | ICD-10-CM | POA: Diagnosis not present

## 2019-04-29 DIAGNOSIS — Z8669 Personal history of other diseases of the nervous system and sense organs: Secondary | ICD-10-CM

## 2019-04-29 DIAGNOSIS — M47819 Spondylosis without myelopathy or radiculopathy, site unspecified: Secondary | ICD-10-CM

## 2019-04-29 DIAGNOSIS — Z8639 Personal history of other endocrine, nutritional and metabolic disease: Secondary | ICD-10-CM

## 2019-04-29 DIAGNOSIS — Z96653 Presence of artificial knee joint, bilateral: Secondary | ICD-10-CM | POA: Diagnosis not present

## 2019-04-29 DIAGNOSIS — Z8679 Personal history of other diseases of the circulatory system: Secondary | ICD-10-CM

## 2019-04-29 DIAGNOSIS — Z79899 Other long term (current) drug therapy: Secondary | ICD-10-CM

## 2019-04-29 DIAGNOSIS — R911 Solitary pulmonary nodule: Secondary | ICD-10-CM | POA: Diagnosis not present

## 2019-04-29 DIAGNOSIS — M5030 Other cervical disc degeneration, unspecified cervical region: Secondary | ICD-10-CM

## 2019-04-29 DIAGNOSIS — Z8673 Personal history of transient ischemic attack (TIA), and cerebral infarction without residual deficits: Secondary | ICD-10-CM | POA: Diagnosis not present

## 2019-04-29 DIAGNOSIS — M7701 Medial epicondylitis, right elbow: Secondary | ICD-10-CM

## 2019-04-29 DIAGNOSIS — M7702 Medial epicondylitis, left elbow: Secondary | ICD-10-CM

## 2019-04-29 MED ORDER — COSENTYX SENSOREADY (300 MG) 150 MG/ML ~~LOC~~ SOAJ: 300.0000 mg | SUBCUTANEOUS | 0 refills | Status: DC

## 2019-04-29 NOTE — Progress Notes (Signed)
Medication Samples have been provided to the patient.  Drug name: Cosentyx 150 mg/ml     Qty: 1   LOT: [id number]   Exp.Date: 01/2020  Dosing instructions: Start injecting 300 mg/ml (2 pens) every 28 days  The patient has been instructed regarding the correct time, dose, and frequency of taking this medication, including desired effects and most common side effects.   Kellyanne Ellwanger C Klever [place] 8:31 AM 04/29/2019

## 2019-04-29 NOTE — Patient Instructions (Signed)
Standing Labs We placed an order today for your standing lab work.    Please come back and get your standing labs in October and every 3 months   We have open lab daily Monday through Thursday from 8:30-12:30 PM and 1:30-4:30 PM and Friday from 8:30-12:30 PM and 1:30 -4:00 PM at the office of Dr. Shaili Deveshwar.   You may experience shorter wait times on Monday and Friday afternoons. The office is located at [address], [address], [place], [place] [address] No appointment is necessary.   Labs are drawn by [facility].  You may receive a bill from [facility] for your lab work.  If you wish to have your labs drawn at another location, please call the office 24 hours in advance to send orders.  If you have any questions regarding directions or hours of operation,  please call [phone].   Just as a reminder please drink plenty of water prior to coming for your lab work. Thanks!   

## 2019-05-03 DIAGNOSIS — Z23 Encounter for immunization: Secondary | ICD-10-CM | POA: Diagnosis not present

## 2019-05-21 DIAGNOSIS — R490 Dysphonia: Secondary | ICD-10-CM | POA: Diagnosis not present

## 2019-05-21 DIAGNOSIS — K219 Gastro-esophageal reflux disease without esophagitis: Secondary | ICD-10-CM | POA: Diagnosis not present

## 2019-06-06 DIAGNOSIS — D8989 Other specified disorders involving the immune mechanism, not elsewhere classified: Secondary | ICD-10-CM | POA: Diagnosis not present

## 2019-06-06 DIAGNOSIS — I129 Hypertensive chronic kidney disease with stage 1 through stage 4 chronic kidney disease, or unspecified chronic kidney disease: Secondary | ICD-10-CM | POA: Diagnosis not present

## 2019-06-06 DIAGNOSIS — M25532 Pain in left wrist: Secondary | ICD-10-CM | POA: Diagnosis not present

## 2019-06-06 DIAGNOSIS — R7989 Other specified abnormal findings of blood chemistry: Secondary | ICD-10-CM | POA: Diagnosis not present

## 2019-06-06 DIAGNOSIS — E119 Type 2 diabetes mellitus without complications: Secondary | ICD-10-CM | POA: Diagnosis not present

## 2019-06-06 DIAGNOSIS — M461 Sacroiliitis, not elsewhere classified: Secondary | ICD-10-CM | POA: Diagnosis not present

## 2019-06-06 DIAGNOSIS — D649 Anemia, unspecified: Secondary | ICD-10-CM | POA: Diagnosis not present

## 2019-06-06 DIAGNOSIS — G43909 Migraine, unspecified, not intractable, without status migrainosus: Secondary | ICD-10-CM | POA: Diagnosis not present

## 2019-06-06 DIAGNOSIS — N182 Chronic kidney disease, stage 2 (mild): Secondary | ICD-10-CM | POA: Diagnosis not present

## 2019-06-06 DIAGNOSIS — M1990 Unspecified osteoarthritis, unspecified site: Secondary | ICD-10-CM | POA: Diagnosis not present

## 2019-06-14 DIAGNOSIS — J383 Other diseases of vocal cords: Secondary | ICD-10-CM | POA: Diagnosis not present

## 2019-06-14 DIAGNOSIS — H1045 Other chronic allergic conjunctivitis: Secondary | ICD-10-CM | POA: Diagnosis not present

## 2019-06-14 DIAGNOSIS — J310 Chronic rhinitis: Secondary | ICD-10-CM | POA: Diagnosis not present

## 2019-06-14 DIAGNOSIS — R062 Wheezing: Secondary | ICD-10-CM | POA: Diagnosis not present

## 2019-06-14 DIAGNOSIS — R69 Illness, unspecified: Secondary | ICD-10-CM | POA: Diagnosis not present

## 2019-06-25 DIAGNOSIS — R69 Illness, unspecified: Secondary | ICD-10-CM | POA: Diagnosis not present

## 2019-07-08 DIAGNOSIS — G43719 Chronic migraine without aura, intractable, without status migrainosus: Secondary | ICD-10-CM | POA: Diagnosis not present

## 2019-07-11 DIAGNOSIS — G4733 Obstructive sleep apnea (adult) (pediatric): Secondary | ICD-10-CM | POA: Diagnosis not present

## 2019-07-23 ENCOUNTER — Other Ambulatory Visit: Payer: Self-pay

## 2019-07-23 ENCOUNTER — Encounter: Payer: Self-pay | Admitting: Cardiovascular Disease

## 2019-07-23 ENCOUNTER — Ambulatory Visit: Payer: Medicare HMO | Admitting: Cardiovascular Disease

## 2019-07-23 VITALS — BP 148/96 | HR 100 | Temp 98.4°F | Ht 62.0 in | Wt 270.0 lb

## 2019-07-23 DIAGNOSIS — E669 Obesity, unspecified: Secondary | ICD-10-CM

## 2019-07-23 DIAGNOSIS — E1169 Type 2 diabetes mellitus with other specified complication: Secondary | ICD-10-CM | POA: Diagnosis not present

## 2019-07-23 DIAGNOSIS — I5032 Chronic diastolic (congestive) heart failure: Secondary | ICD-10-CM | POA: Diagnosis not present

## 2019-07-23 DIAGNOSIS — G4733 Obstructive sleep apnea (adult) (pediatric): Secondary | ICD-10-CM | POA: Diagnosis not present

## 2019-07-23 DIAGNOSIS — E7800 Pure hypercholesterolemia, unspecified: Secondary | ICD-10-CM

## 2019-07-23 DIAGNOSIS — I10 Essential (primary) hypertension: Secondary | ICD-10-CM | POA: Diagnosis not present

## 2019-07-23 NOTE — Progress Notes (Signed)
Patient ID: [name], female   DOB: 07/06/1961, 58 y.o.   MRN: [id number]    Cardiology Office Note    Date:  07/24/2019   ID:  [name], DOB 06/02/1961, MRN [id number]  PCP:  Shon Baton, MD  Cardiologist:   Sanda Klein, MD   Chief Complaint  Patient presents with  . Congestive Heart Failure    History of Present Illness:  [name] is a 58 y.o. female with history of chronic diastolic CHF, morbid obesity, OSA, TIA, GERD, HTN, HLD, DM, asthma, anemia, former tobacco abuse, ankylosing spondylitis who presents for f/u.  From a cardiovascular point of view she is doing quite well.  She is not having any chest pain.  She denies palpitations, dizziness or syncope.  She uses furosemide only as needed, less than once a week.  She is still taking potassium on a daily basis and her most recent potassium level was normal just a couple of weeks ago.  Her blood pressure is well controlled, in fact her dose of irbesartan had to be decreased to 37.5 mg daily for low blood pressure.  The patient specifically denies any chest pain at rest exertion, dyspnea at rest or with exertion, orthopnea, paroxysmal nocturnal dyspnea, syncope, palpitations, focal neurological deficits, intermittent claudication, lower extremity edema, unexplained weight gain, cough, hemoptysis or wheezing.   Past Medical History:  Diagnosis Date  . Anemia    takes iron supplement  . Anxiety   . Asthma    daily and prn inhalers  . Bone spur    Left foot  . Chest pain    a. 10/2015: NST showing possible mild ischemia but most consistent with breast attenuation. Low-risk study.   . Chronic diastolic CHF (congestive heart failure) ([place])   . Common migraine with intractable migraine 07/06/2018  . DDD (degenerative disc disease), cervical   . Degenerative disc disease   . Diabetes mellitus ([place])   . Essential hypertension   . Family history of early CAD   . Former tobacco use   .  GERD (gastroesophageal reflux disease)   . GERD (gastroesophageal reflux disease)   . Headache(784.0)    migraine-like  . History of TIA (transient ischemic attack) early 2013   no weakness or deficits  . History of UTI   . Hyperlipidemia   . Insomnia   . Migraines   . Morbid obesity ([place])   . Partial tear of rotator cuff(726.13)   . Psoriatic arthritis ([place])   . Shoulder impingement 04/2012   left  . Sleep apnea    uses CPAP nightly    Past Surgical History:  Procedure Laterality Date  . ABDOMINAL HYSTERECTOMY  03/28/2002   complete  . ANTERIOR CERVICAL DECOMP/DISCECTOMY FUSION  02/18/2010   C6-7  . BREAST BIOPSY Right   . DILATION AND CURETTAGE OF UTERUS     2000  . FOOT SURGERY Right 2015  . ROTATOR CUFF REPAIR Left 2013  . TOTAL KNEE ARTHROPLASTY  09/07/2009   left  . TOTAL KNEE ARTHROPLASTY  10/13/2008   right  . TOTAL SHOULDER ARTHROPLASTY    . UMBILICAL HERNIA REPAIR  03/28/2002  . WRIST SURGERY  2020    Outpatient Medications Prior to Visit  Medication Sig Dispense Refill  . acidophilus (RISAQUAD) CAPS Take 1 capsule by mouth every morning.     Marland Kitchen amoxicillin (AMOXIL) 500 MG capsule Take 2,000 mg by mouth as needed. Before dental procedures     . aspirin 81 MG  tablet Take 81 mg by mouth daily.     Marland Kitchen atorvastatin (LIPITOR) 20 MG tablet Take 20 mg by mouth every evening.     Marland Kitchen azelastine (ASTELIN) 0.1 % nasal spray Place 1 spray into both nostrils as needed for rhinitis. Use in each nostril as directed    . baclofen (LIORESAL) 10 MG tablet Take 10-20 mg by mouth See admin instructions. Weekly as needed    . chlorproMAZINE (THORAZINE) 25 MG tablet Take 25-50 mg by mouth See admin instructions. every 3 hours as needed max twice month  1  . cyclobenzaprine (FLEXERIL) 10 MG tablet Take 10 mg by mouth 2 (two) times daily as needed for muscle spasms.    . diclofenac sodium (VOLTAREN) 1 % GEL Apply three grams to three large joints up to three times daily (Patient taking  differently: Apply 2 g topically as needed. Apply to Knee, fingers, elbow and left shoulder) 3 Tube 3  . DULoxetine (CYMBALTA) 60 MG capsule Take 60 mg by mouth daily.   3  . ferrous sulfate 325 (65 FE) MG tablet Take 325 mg by mouth 2 (two) times a week.    . furosemide (LASIX) 40 MG tablet TAKE 1 TABLET BY MOUTH DAILY (Patient taking differently: Take 20 mg by mouth as needed. ) 90 tablet 3  . HYDROcodone-acetaminophen (NORCO/VICODIN) 5-325 MG per tablet Take 1 tablet by mouth as needed for moderate pain.     Marland Kitchen irbesartan (AVAPRO) 75 MG tablet Take 37.5 mg by mouth. Take only if blood pressure above 130    . loratadine (CLARITIN) 10 MG tablet Take 10 mg by mouth daily.     Marland Kitchen LORazepam (ATIVAN) 0.5 MG tablet Take 0.5 mg by mouth as needed for anxiety.     . meloxicam (MOBIC) 15 MG tablet Take 15 mg by mouth daily.   3  . metFORMIN (GLUCOPHAGE) 500 MG tablet Take 500 mg by mouth daily with breakfast.     . methocarbamol (ROBAXIN) 500 MG tablet Take 500 mg by mouth every 8 (eight) hours as needed for muscle spasms.   0  . mometasone (NASONEX) 50 MCG/ACT nasal spray Place 2 sprays into the nose as needed.    . Multiple Vitamin (MULITIVITAMIN WITH MINERALS) TABS Take 1 tablet by mouth daily.    . mupirocin nasal ointment (BACTROBAN) 2 % Place 1 application into the nose 2 (two) times daily. Use one-half of tube in each . After application, press sides of nose together and gently massage.    . nitroGLYCERIN (NITRO-DUR) 0.2 mg/hr patch Place 1 patch (0.2 mg total) onto the skin daily. (Patient taking differently: Place 0.2 mg onto the skin as needed (pain). ) 30 patch 12  . NON FORMULARY at bedtime. CPAP    . Olopatadine HCl (PATADAY OP) Place 1 drop into both eyes daily at 12 noon.     Marland Kitchen omeprazole (PRILOSEC) 20 MG capsule Take 20 mg by mouth 2 (two) times daily.     . ONE TOUCH ULTRA TEST test strip     . ONETOUCH DELICA LANCETS 99991111 MISC     . potassium chloride SA (K-DUR,KLOR-CON) 20 MEQ tablet  Take 40 mEq by mouth 2 (two) times daily.   3  . PROAIR HFA 108 (90 Base) MCG/ACT inhaler Inhale 2 puffs into the lungs as needed for shortness of breath.   0  . QVAR REDIHALER 40 MCG/ACT inhaler Inhale 2 puffs into the lungs daily at 12 noon.   5  .  Secukinumab, 300 MG Dose, (COSENTYX SENSOREADY, 300 MG,) 150 MG/ML SOAJ Inject 300 mg into the skin every 28 (twenty-eight) days. 6 pen 0  . topiramate (TOPAMAX) 100 MG tablet Take 300 mg by mouth at bedtime.    . vitamin B-12 (CYANOCOBALAMIN) 500 MCG tablet Take 500 mcg by mouth daily as needed.      No facility-administered medications prior to visit.      Allergies:   Patient has no known allergies.   Social History   Socioeconomic History  . Marital status: Married    Spouse name: Jenny Reichmann  . Number of children: 5  . Years of education: some college  . Highest education level: Not on file  Occupational History  . Occupation: Disabled  Social Needs  . Financial resource strain: Not on file  . Food insecurity    Worry: Not on file    Inability: Not on file  . Transportation needs    Medical: Not on file    Non-medical: Not on file  Tobacco Use  . Smoking status: Former Smoker    Packs/day: 0.50    Years: 20.00    Pack years: 10.00    Types: Cigarettes    Quit date: 10/02/1999    Years since quitting: 19.8  . Smokeless tobacco: Never Used  Substance and Sexual Activity  . Alcohol use: No  . Drug use: No  . Sexual activity: Yes    Birth control/protection: None  Lifestyle  . Physical activity    Days per week: Not on file    Minutes per session: Not on file  . Stress: Not on file  Relationships  . Social Herbalist on phone: Not on file    Gets together: Not on file    Attends religious service: Not on file    Active member of club or organization: Not on file    Attends meetings of clubs or organizations: Not on file    Relationship status: Not on file  Other Topics Concern  . Not on file  Social History  Narrative   Lives with husband, Jenny Reichmann   Caffeine use: 1 cup coffee every morning   Right handed      Family History:  The patient's family history includes Breast cancer in her sister and sister; Cancer in her maternal grandmother and paternal grandfather; Coronary artery disease in her sister; Diabetes in her father, maternal grandmother, mother, paternal grandfather, sister, and sister; Hypertension in her father, maternal grandmother, and mother; Kidney disease in her father.   ROS:   Please see the history of present illness.    ROS All other systems are reviewed and are negative.   PHYSICAL EXAM:   VS:  BP (!) 148/96   Pulse 100   Temp 98.4 F (36.9 C)   Ht 5\' 2"  (1.575 m)   Wt 270 lb (122.5 kg)   SpO2 97%   BMI 49.38 kg/m      General: Alert, oriented x3, no distress, morbidly obese Head: no evidence of trauma, PERRL, EOMI, no exophtalmos or lid lag, no myxedema, no xanthelasma; normal ears, nose and oropharynx Neck: normal jugular venous pulsations and no hepatojugular reflux; brisk carotid pulses without delay and no carotid bruits Chest: clear to auscultation, no signs of consolidation by percussion or palpation, normal fremitus, symmetrical and full respiratory excursions Cardiovascular: normal position and quality of the apical impulse, regular rhythm, normal first and second heart sounds, no murmurs, rubs or gallops Abdomen: no tenderness  or distention, no masses by palpation, no abnormal pulsatility or arterial bruits, normal bowel sounds, no hepatosplenomegaly Extremities: no clubbing, cyanosis or edema; 2+ radial, ulnar and brachial pulses bilaterally; 2+ right femoral, posterior tibial and dorsalis pedis pulses; 2+ left femoral, posterior tibial and dorsalis pedis pulses; no subclavian or femoral bruits Neurological: grossly nonfocal Psych: Normal mood and affect    Wt Readings from Last 3 Encounters:  07/23/19 270 lb (122.5 kg)  04/29/19 274 lb (124.3 kg)   03/27/19 266 lb (120.7 kg)      Studies/Labs Reviewed:   EKG:  EKG was ordered in the office today.  This shows normal sinus rhythm, normal tracing. Recent Labs: 03/22/2019: ALT 14; BUN 11; Creat 0.79; Hemoglobin 12.0; Platelets 249; Potassium 4.3; Sodium 140   Lipid Panel    Component Value Date/Time   CHOL 143 03/12/2013 0200   TRIG 128 03/12/2013 0200   HDL 42 03/12/2013 0200   CHOLHDL 3.4 03/12/2013 0200   VLDL 26 03/12/2013 0200   LDLCALC 75 03/12/2013 0200   10/01/2018 Total cholesterol 159, HDL 50, LDL 92, triglycerides 87  ASSESSMENT:    1. Chronic diastolic heart failure ([place])   2. Essential hypertension   3. Morbid obesity ([place])   4. OSA (obstructive sleep apnea)   5. Hypercholesterolemia   6. Diabetes mellitus type 2 in obese [facility])      PLAN:  In order of problems listed above:  1. CHF: NYHA functional class I.  Volume assessment is difficult due to morbid obesity, but there are no overt signs of hypervolemia 2. HTN: Her blood pressure was uncharacteristically high today.  Very often her blood pressure is around 100/70.  No changes made to her medications. 3. Obesity: has made a little bit of progress with weight loss  4. OSA: Reports compliance with CPAP and denies daytime hypersomnolence 5. HLP: LDL at target (less than 100) on statin.  She does not have known coronary or vascular stenoses. 6. DM: Excellent control.  Most recent A1c 5.7% on low-dose metformin monotherapy.    Medication Adjustments/Labs and Tests Ordered: Current medicines are reviewed at length with the patient today.  Concerns regarding medicines are outlined above.  Medication changes, Labs and Tests ordered today are listed in the Patient Instructions below. Patient Instructions  Medication Instructions:  No changes  *If you need a refill on your cardiac medications before your next appointment, please call your pharmacy*  Lab Work: None ordered If you have labs (blood work)  drawn today and your tests are completely normal, you will receive your results only by: Marland Kitchen MyChart Message (if you have MyChart) OR . A paper copy in the mail If you have any lab test that is abnormal or we need to change your treatment, we will call you to review the results.  Testing/Procedures: None ordered  Follow-Up: At [facility], you and your health needs are our priority.  As part of our continuing mission to provide you with exceptional heart care, we have created designated Provider Care Teams.  These Care Teams include your primary Cardiologist (physician) and Advanced Practice Providers (APPs -  Physician Assistants and Nurse Practitioners) who all work together to provide you with the care you need, when you need it.  Your next appointment:   12 months  The format for your next appointment:   In Person  Provider:   Sanda Klein, MD        Signed, Sanda Klein, MD  07/24/2019 9:18 PM  [facility] [address], [place], [place]  [address] Phone: [phone]; Fax: [phone]

## 2019-07-23 NOTE — Patient Instructions (Signed)

## 2019-07-24 ENCOUNTER — Encounter: Payer: Self-pay | Admitting: Cardiovascular Disease

## 2019-07-31 ENCOUNTER — Other Ambulatory Visit: Payer: Self-pay

## 2019-07-31 DIAGNOSIS — Z79899 Other long term (current) drug therapy: Secondary | ICD-10-CM

## 2019-08-01 LAB — COMPLETE METABOLIC PANEL WITH GFR
AG Ratio: 1.4 (calc) (ref 1.0–2.5)
ALT: 14 U/L (ref 6–29)
AST: 14 U/L (ref 10–35)
Albumin: 4.1 g/dL (ref 3.6–5.1)
Alkaline phosphatase (APISO): 127 U/L (ref 37–153)
BUN: 11 mg/dL (ref 7–25)
CO2: 24 mmol/L (ref 20–32)
Calcium: 9.4 mg/dL (ref 8.6–10.4)
Chloride: 107 mmol/L (ref 98–110)
Creat: 0.82 mg/dL (ref 0.50–1.05)
GFR, Est African American: 92 mL/min/{1.73_m2} (ref >=60)
GFR, Est Non African American: 79 mL/min/{1.73_m2} (ref >=60)
Globulin: 2.9 g/dL (calc) (ref 1.9–3.7)
Glucose, Bld: 99 mg/dL (ref 65–99)
Potassium: 4.7 mmol/L (ref 3.5–5.3)
Sodium: 140 mmol/L (ref 135–146)
Total Bilirubin: 0.4 mg/dL (ref 0.2–1.2)
Total Protein: 7 g/dL (ref 6.1–8.1)

## 2019-08-01 LAB — CBC WITH DIFFERENTIAL/PLATELET
Absolute Monocytes: 589 cells/uL (ref 200–950)
Basophils Absolute: 42 cells/uL (ref 0–200)
Basophils Relative: 0.5 %
Eosinophils Absolute: 249 cells/uL (ref 15–500)
Eosinophils Relative: 3 %
HCT: 39 % (ref 35.0–45.0)
Hemoglobin: 12.3 g/dL (ref 11.7–15.5)
Lymphs Abs: 2241 cells/uL (ref 850–3900)
MCH: 25.7 pg — ABNORMAL LOW (ref 27.0–33.0)
MCHC: 31.5 g/dL — ABNORMAL LOW (ref 32.0–36.0)
MCV: 81.4 fL (ref 80.0–100.0)
MPV: 9.7 fL (ref 7.5–12.5)
Monocytes Relative: 7.1 %
Neutro Abs: 5179 cells/uL (ref 1500–7800)
Neutrophils Relative %: 62.4 %
Platelets: 248 10*3/uL (ref 140–400)
RBC: 4.79 10*6/uL (ref 3.80–5.10)
RDW: 14.9 % (ref 11.0–15.0)
Total Lymphocyte: 27 %
WBC: 8.3 10*3/uL (ref 3.8–10.8)

## 2019-08-01 NOTE — Progress Notes (Signed)
Within normal limits

## 2019-08-05 DIAGNOSIS — N182 Chronic kidney disease, stage 2 (mild): Secondary | ICD-10-CM | POA: Diagnosis not present

## 2019-08-05 DIAGNOSIS — I129 Hypertensive chronic kidney disease with stage 1 through stage 4 chronic kidney disease, or unspecified chronic kidney disease: Secondary | ICD-10-CM | POA: Diagnosis not present

## 2019-08-05 DIAGNOSIS — M79672 Pain in left foot: Secondary | ICD-10-CM | POA: Diagnosis not present

## 2019-08-05 DIAGNOSIS — E119 Type 2 diabetes mellitus without complications: Secondary | ICD-10-CM | POA: Diagnosis not present

## 2019-08-07 ENCOUNTER — Telehealth: Payer: Self-pay

## 2019-08-07 MED ORDER — COSENTYX SENSOREADY (300 MG) 150 MG/ML ~~LOC~~ SOAJ: 300.0000 mg | SUBCUTANEOUS | 0 refills | Status: DC

## 2019-08-07 NOTE — Telephone Encounter (Signed)
Refill received via fax from [facility]  For cosentyx.   Last Visit: 04/29/2019 Next Visit: 10/04/2019 Labs: 07/31/2019 WNL  TB Gold: 01/04/2019 negative   Okay to refill per Dr. Estanislado Pandy.

## 2019-08-14 ENCOUNTER — Encounter: Payer: Self-pay | Admitting: Podiatry

## 2019-08-14 ENCOUNTER — Ambulatory Visit: Payer: Medicare HMO | Admitting: Podiatry

## 2019-08-14 ENCOUNTER — Other Ambulatory Visit: Payer: Self-pay | Admitting: Podiatry

## 2019-08-14 ENCOUNTER — Other Ambulatory Visit: Payer: Self-pay

## 2019-08-14 ENCOUNTER — Ambulatory Visit (INDEPENDENT_AMBULATORY_CARE_PROVIDER_SITE_OTHER): Payer: Medicare HMO

## 2019-08-14 DIAGNOSIS — M79672 Pain in left foot: Secondary | ICD-10-CM

## 2019-08-14 DIAGNOSIS — M21619 Bunion of unspecified foot: Secondary | ICD-10-CM

## 2019-08-14 DIAGNOSIS — M21612 Bunion of left foot: Secondary | ICD-10-CM | POA: Diagnosis not present

## 2019-08-14 DIAGNOSIS — M778 Other enthesopathies, not elsewhere classified: Secondary | ICD-10-CM

## 2019-08-14 NOTE — Progress Notes (Signed)
Subjective:   Patient ID: [name], female   DOB: 58 y.o.   MRN: [id number]   HPI Patient states she is developed a lot of pain on top of her left foot and she is not been seen for over 3 years and states it is been bothering her for the last 3 weeks.  Does not remember specific injury   Review of Systems  All other systems reviewed and are negative.       Objective:  Physical Exam Vitals signs and nursing note reviewed.  Constitutional:      Appearance: She is well-developed.  Pulmonary:     Effort: Pulmonary effort is normal.  Musculoskeletal: Normal range of motion.  Skin:    General: Skin is warm.  Neurological:     Mental Status: She is alert.     Neurovascular status intact muscle strength adequate with no indications of tendon dysfunction of the extensor tendon left to the hallux or second digit.  The pain is mostly on the dorsal surface and it is inflamed with pressure and makes it hard to walk comfortably with mild edema noted.     Assessment:  Appears to be acute tendinitis left     Plan:  H&P x-ray reviewed and today I did sterile prep and injected the extensor complex 3 mg Dexasone Kenalog 5 mg Xylocaine advised on reduced activity rigid bottom shoes and reappoint if symptoms persist.  Patient does not smoke currently and would like to be more active  X-rays indicate there is bone spur formation around the talonavicular joint but no indications of significant arthritic process

## 2019-09-10 DIAGNOSIS — R69 Illness, unspecified: Secondary | ICD-10-CM | POA: Diagnosis not present

## 2019-09-30 NOTE — Progress Notes (Signed)
Virtual Visit via Telephone Note  I connected with [name] on 10/04/19 at  8:00 AM EST by telephone and verified that I am speaking with the correct person using two identifiers.  Location: Patient: Home Provider: Clinic  This service was conducted via virtual visit.  The patient was located at home. I was located in my office.  Consent was obtained prior to the virtual visit and is aware of possible charges through their insurance for this visit.  The patient is an established patient.  Dr. Estanislado Pandy, MD conducted the virtual visit and Hazel Sams, PA-C acted as scribe during the service.  Office staff helped with scheduling follow up visits after the service was conducted.     I discussed the limitations, risks, security and privacy concerns of performing an evaluation and management service by telephone and the availability of in person appointments. I also discussed with the patient that there may be a patient responsible charge related to this service. The patient expressed understanding and agreed to proceed.  CC: SI joint pain  History of Present Illness: [name] is a 59 y.o. female with history of spondyloarthropathy.  She is on Cosentyx 300 mg sq injections every 28 days. She has intermittent SI joint discomfort, which has been more frequent recently. Her last injection was on 09/30/19.  She has been experiencing more frequent breakthrough pain. She denies any neck pain at this time.  She has difficulty rising from a seated position and climbing steps due to discomfort in bilateral knee replacements.      Review of Systems  Constitutional: Positive for malaise/fatigue. Negative for fever.  HENT: Negative for congestion.   Eyes: Negative for photophobia, pain, discharge and redness.  Respiratory: Negative for cough, shortness of breath and wheezing.   Cardiovascular: Positive for leg swelling. Negative for chest pain and palpitations.  Gastrointestinal:  Negative for blood in stool, constipation and diarrhea.  Genitourinary: Negative for dysuria.  Musculoskeletal: Positive for joint pain. Negative for back pain, myalgias and neck pain.  Skin: Negative for rash.  Neurological: Negative for dizziness, weakness and headaches.  Endo/Heme/Allergies: Does not bruise/bleed easily.  Psychiatric/Behavioral: Negative for depression and memory loss. The patient is not nervous/anxious and does not have insomnia.      Observations/Objective:  Physical Exam  Constitutional: She is oriented to person, place, and time.  Neurological: She is alert and oriented to person, place, and time.  Psychiatric: Mood, memory, affect and judgment normal.     Patient reports morning stiffness for 1 hour.   Patient reports nocturnal pain.  Difficulty dressing/grooming: Denies Difficulty climbing stairs: Reports Difficulty getting out of chair: Reports Difficulty using hands for taps, buttons, cutlery, and/or writing: Reports  Assessment and Plan: Visit Diagnoses: Spondyloarthropathy - MRI positive for sacroiliitis, HLA-B27 negative, elevated ESR: She has been experiencing more frequent bouts of breakthrough pain and stiffness in bilateral SI joints. She has been injecting Cosentyx 300 mg sq injections every 28 days.  She noticed improvement since increasing the dose of cosentyx from 150 mg to 300 mg sq injections every 28 days.   We discussed spacing the dose of Cosentyx to 150 mg sq injections every 14 days to see if this will be more effective.  She will come by the office to pick up a sample of cosentyx.  She was advised to notify us if she continues to have increased joint pain or joint inflammation.  She will follow up in 3 months.   High risk medication  use - Cosentyx 300 mg every 28 days.  Last TB gold negative on 01/04/2019 and will monitor yearly.   CBC and CMP were drawn on 07/31/19.  She is due to update lab work in February and every 3 months.   Medial  epicondylitis of both elbows - She experiences intermittent discomfort.   DDD (degenerative disc disease), cervical: She is not having any neck pain or stiffness at this time.  No symptoms of radiculopathy.   Arthropathy of lumbar facet joint: She has chronic lower back pain.  H/O total knee replacement, bilateral: She experiences discomfort when rising from a seated position or when climbing steps.   Other medical conditions are listed as follows:   Lung nodule seen on imaging study, CTA of chest, 6 mm nodule, followed by Dr. Virgina Jock   History of diabetes mellitus   Chronic diastolic heart failure ([place])   History of hyperlipidemia   History of TIA (transient ischemic attack)   History of hypertension   History of sleep apnea   Follow Up Instructions: She will follow up in 3 months   I discussed the assessment and treatment plan with the patient. The patient was provided an opportunity to ask questions and all were answered. The patient agreed with the plan and demonstrated an understanding of the instructions.   The patient was advised to call back or seek an in-person evaluation if the symptoms worsen or if the condition fails to improve as anticipated.  I provided 25 minutes of non-face-to-face time during this encounter.  Bo Merino, MD   Scribed by-  Hazel Sams, PA-C

## 2019-10-04 ENCOUNTER — Other Ambulatory Visit: Payer: Self-pay

## 2019-10-04 ENCOUNTER — Telehealth (INDEPENDENT_AMBULATORY_CARE_PROVIDER_SITE_OTHER): Payer: Medicare HMO | Admitting: Rheumatology

## 2019-10-04 ENCOUNTER — Encounter: Payer: Self-pay | Admitting: Rheumatology

## 2019-10-04 DIAGNOSIS — Z96653 Presence of artificial knee joint, bilateral: Secondary | ICD-10-CM

## 2019-10-04 DIAGNOSIS — Z79899 Other long term (current) drug therapy: Secondary | ICD-10-CM

## 2019-10-04 DIAGNOSIS — Z8673 Personal history of transient ischemic attack (TIA), and cerebral infarction without residual deficits: Secondary | ICD-10-CM

## 2019-10-04 DIAGNOSIS — Z8639 Personal history of other endocrine, nutritional and metabolic disease: Secondary | ICD-10-CM | POA: Diagnosis not present

## 2019-10-04 DIAGNOSIS — M47816 Spondylosis without myelopathy or radiculopathy, lumbar region: Secondary | ICD-10-CM

## 2019-10-04 DIAGNOSIS — I5032 Chronic diastolic (congestive) heart failure: Secondary | ICD-10-CM

## 2019-10-04 DIAGNOSIS — M5030 Other cervical disc degeneration, unspecified cervical region: Secondary | ICD-10-CM

## 2019-10-04 DIAGNOSIS — R911 Solitary pulmonary nodule: Secondary | ICD-10-CM | POA: Diagnosis not present

## 2019-10-04 DIAGNOSIS — M7701 Medial epicondylitis, right elbow: Secondary | ICD-10-CM

## 2019-10-04 DIAGNOSIS — Z8679 Personal history of other diseases of the circulatory system: Secondary | ICD-10-CM

## 2019-10-04 DIAGNOSIS — M7702 Medial epicondylitis, left elbow: Secondary | ICD-10-CM

## 2019-10-04 DIAGNOSIS — M47819 Spondylosis without myelopathy or radiculopathy, site unspecified: Secondary | ICD-10-CM | POA: Diagnosis not present

## 2019-10-04 DIAGNOSIS — Z8669 Personal history of other diseases of the nervous system and sense organs: Secondary | ICD-10-CM

## 2019-10-07 DIAGNOSIS — E7849 Other hyperlipidemia: Secondary | ICD-10-CM | POA: Diagnosis not present

## 2019-10-07 DIAGNOSIS — I10 Essential (primary) hypertension: Secondary | ICD-10-CM | POA: Diagnosis not present

## 2019-10-07 DIAGNOSIS — Z0000 Encounter for general adult medical examination without abnormal findings: Secondary | ICD-10-CM | POA: Diagnosis not present

## 2019-10-07 DIAGNOSIS — E119 Type 2 diabetes mellitus without complications: Secondary | ICD-10-CM | POA: Diagnosis not present

## 2019-10-09 ENCOUNTER — Telehealth: Payer: Self-pay | Admitting: Rheumatology

## 2019-10-09 NOTE — Telephone Encounter (Signed)
Patient had labs done 10/07/19 at her complete physical. Patient going to have PCP fax over results. Patient would like to know if she will still need to have labs drawn with [place] on 10/14/19, or if those will cover labs needed here? Please call patient to advise once labs are received.

## 2019-10-09 NOTE — Telephone Encounter (Signed)
Attempted to contact the patient and left message to advise will review once labs are received from PCP. Will let her know if she needs any additional labs.

## 2019-10-10 ENCOUNTER — Telehealth: Payer: Self-pay | Admitting: *Deleted

## 2019-10-10 DIAGNOSIS — Z1212 Encounter for screening for malignant neoplasm of rectum: Secondary | ICD-10-CM | POA: Diagnosis not present

## 2019-10-10 NOTE — Telephone Encounter (Signed)
Results received from [place] drawn on 10/03/19  MCH 25.5  MCHC 29.6 MPV 6.7 LDL/HDL Ratio 1.7  On Cosentyx 300 mg every 28 days.

## 2019-10-14 ENCOUNTER — Telehealth: Payer: Self-pay | Admitting: Rheumatology

## 2019-10-14 DIAGNOSIS — N390 Urinary tract infection, site not specified: Secondary | ICD-10-CM | POA: Diagnosis not present

## 2019-10-14 DIAGNOSIS — Z0000 Encounter for general adult medical examination without abnormal findings: Secondary | ICD-10-CM | POA: Diagnosis not present

## 2019-10-14 DIAGNOSIS — Z1331 Encounter for screening for depression: Secondary | ICD-10-CM | POA: Diagnosis not present

## 2019-10-14 DIAGNOSIS — G43909 Migraine, unspecified, not intractable, without status migrainosus: Secondary | ICD-10-CM | POA: Diagnosis not present

## 2019-10-14 DIAGNOSIS — R609 Edema, unspecified: Secondary | ICD-10-CM | POA: Diagnosis not present

## 2019-10-14 DIAGNOSIS — D649 Anemia, unspecified: Secondary | ICD-10-CM | POA: Diagnosis not present

## 2019-10-14 DIAGNOSIS — R195 Other fecal abnormalities: Secondary | ICD-10-CM | POA: Diagnosis not present

## 2019-10-14 DIAGNOSIS — M545 Low back pain: Secondary | ICD-10-CM | POA: Diagnosis not present

## 2019-10-14 DIAGNOSIS — I129 Hypertensive chronic kidney disease with stage 1 through stage 4 chronic kidney disease, or unspecified chronic kidney disease: Secondary | ICD-10-CM | POA: Diagnosis not present

## 2019-10-14 DIAGNOSIS — R350 Frequency of micturition: Secondary | ICD-10-CM | POA: Diagnosis not present

## 2019-10-14 DIAGNOSIS — E119 Type 2 diabetes mellitus without complications: Secondary | ICD-10-CM | POA: Diagnosis not present

## 2019-10-14 DIAGNOSIS — M1990 Unspecified osteoarthritis, unspecified site: Secondary | ICD-10-CM | POA: Diagnosis not present

## 2019-10-14 NOTE — Telephone Encounter (Signed)
Shanita from [employer] patient assistance left a voicemail stating a Prior Authorization is needed for patient's prescription of Cosentyx.  Please fax to [phone]   If you have any questions, please call back at [phone]

## 2019-10-14 NOTE — Telephone Encounter (Signed)
Submitted a Prior Authorization request to [facility] for [place] via Cover My Meds. Will update once we receive a response.  (Key: [id number])   Mariella Saa, PharmD, [place], [place] Clinical Specialty Pharmacist [phone]  10/14/2019 3:02 PM

## 2019-10-14 NOTE — Telephone Encounter (Signed)
Received notification from [facility] regarding a prior authorization for [place]. Authorization has been APPROVED from 09/13/19 to 09/11/20.   Will send document to scan center and faxed to [employer].  4:14 PM [name], CPhT

## 2019-10-15 ENCOUNTER — Telehealth: Payer: Self-pay | Admitting: Gastroenterology

## 2019-10-15 NOTE — Telephone Encounter (Signed)
Can you please schedule an appt for the pt to see Dr Ardis Hughs.

## 2019-10-24 NOTE — Telephone Encounter (Signed)
Received a fax from  [employer] regarding an approval for [place] from 10/14/2019 to 09/11/2020.   Will send document to scan center.  Mariella Saa, PharmD, [place], CPP Clinical Specialty Pharmacist [phone]  10/24/2019 11:45 AM

## 2019-10-31 DIAGNOSIS — H52223 Regular astigmatism, bilateral: Secondary | ICD-10-CM | POA: Diagnosis not present

## 2019-10-31 DIAGNOSIS — E119 Type 2 diabetes mellitus without complications: Secondary | ICD-10-CM | POA: Diagnosis not present

## 2019-10-31 DIAGNOSIS — M069 Rheumatoid arthritis, unspecified: Secondary | ICD-10-CM | POA: Diagnosis not present

## 2019-10-31 DIAGNOSIS — Z7984 Long term (current) use of oral hypoglycemic drugs: Secondary | ICD-10-CM | POA: Diagnosis not present

## 2019-10-31 DIAGNOSIS — I10 Essential (primary) hypertension: Secondary | ICD-10-CM | POA: Diagnosis not present

## 2019-10-31 DIAGNOSIS — H524 Presbyopia: Secondary | ICD-10-CM | POA: Diagnosis not present

## 2019-10-31 DIAGNOSIS — H5203 Hypermetropia, bilateral: Secondary | ICD-10-CM | POA: Diagnosis not present

## 2019-10-31 DIAGNOSIS — H11159 Pinguecula, unspecified eye: Secondary | ICD-10-CM | POA: Diagnosis not present

## 2019-10-31 DIAGNOSIS — H02401 Unspecified ptosis of right eyelid: Secondary | ICD-10-CM | POA: Diagnosis not present

## 2019-11-05 DIAGNOSIS — G43719 Chronic migraine without aura, intractable, without status migrainosus: Secondary | ICD-10-CM | POA: Diagnosis not present

## 2019-11-13 ENCOUNTER — Encounter: Payer: Self-pay | Admitting: Gastroenterology

## 2019-11-13 ENCOUNTER — Ambulatory Visit: Payer: Medicare HMO | Admitting: Gastroenterology

## 2019-11-13 DIAGNOSIS — Z01818 Encounter for other preprocedural examination: Secondary | ICD-10-CM

## 2019-11-13 DIAGNOSIS — R195 Other fecal abnormalities: Secondary | ICD-10-CM | POA: Diagnosis not present

## 2019-11-13 MED ORDER — SUPREP BOWEL PREP KIT 17.5-3.13-1.6 GM/177ML PO SOLN: 1.0000 | ORAL | 0 refills | Status: DC

## 2019-11-13 NOTE — Patient Instructions (Signed)
If you are age 59 or older, your body mass index should be between 23-30. Your Body mass index is 49.38 kg/m. If this is out of the aforementioned range listed, please consider follow up with your Primary Care Provider.  If you are age 25 or younger, your body mass index should be between 19-25. Your Body mass index is 49.38 kg/m. If this is out of the aformentioned range listed, please consider follow up with your Primary Care Provider.   You have been scheduled for a colonoscopy. Please follow written instructions given to you at your visit today.  Please pick up your prep supplies at the pharmacy within the next 1-3 days. If you use inhalers (even only as needed), please bring them with you on the day of your procedure.  Thank you, Dr Ardis Hughs

## 2019-11-13 NOTE — Progress Notes (Signed)
HPI: This is a very pleasant 59 year old woman who was referred to me by Shon Baton, MD  to evaluate Hemoccult positive stool.    Stool testing as part of annual physical last month was positive for microscopic blood.  Stool testing as part of annual colon cancer screening July 2020 was negative.  She does intermittently see some bright red blood on tissue paper when wiping but she has never seen any blood in her stool.  She has no real issues with constipation or diarrhea.  She has however had C. difficile twice, sounds related to antibiotics around those times.  Colon cancer does not run in her family  Her weight is overall stable  Old Data Reviewed: January 2021 Hemoccult testing was positive Blood work January 2021 normal CBC, normal complete metabolic profile  I did a colonoscopy for her January 2013 for routine risk colon cancer screening.  I removed a diminutive colon polyp and it was not precancerous on pathology.  She had a colon cancer screening with Hemoccult cards done July 2020 and she was FOB negative  Review of systems: Pertinent positive and negative review of systems were noted in the above HPI section. All other review negative.   Past Medical History:  Diagnosis Date  . Anemia    takes iron supplement  . Anxiety   . Asthma    daily and prn inhalers  . Bone spur    Left foot  . Chest pain    a. 10/2015: NST showing possible mild ischemia but most consistent with breast attenuation. Low-risk study.   . Chronic diastolic CHF (congestive heart failure) ([place])   . Common migraine with intractable migraine 07/06/2018  . DDD (degenerative disc disease), cervical   . Degenerative disc disease   . Diabetes mellitus ([place])   . Essential hypertension   . Family history of early CAD   . Former tobacco use   . GERD (gastroesophageal reflux disease)   . GERD (gastroesophageal reflux disease)   . Headache(784.0)    migraine-like  . History of TIA (transient  ischemic attack) early 2013   no weakness or deficits  . History of UTI   . Hyperlipidemia   . Insomnia   . Migraines   . Morbid obesity ([place])   . Partial tear of rotator cuff(726.13)   . Psoriatic arthritis ([place])   . Shoulder impingement 04/2012   left  . Sleep apnea    uses CPAP nightly    Past Surgical History:  Procedure Laterality Date  . ABDOMINAL HYSTERECTOMY  03/28/2002   complete  . ANTERIOR CERVICAL DECOMP/DISCECTOMY FUSION  02/18/2010   C6-7  . BREAST BIOPSY Right   . DILATION AND CURETTAGE OF UTERUS     2000  . FOOT SURGERY Right 2015  . ROTATOR CUFF REPAIR Left 2013  . TOTAL KNEE ARTHROPLASTY  09/07/2009   left  . TOTAL KNEE ARTHROPLASTY  10/13/2008   right  . TOTAL SHOULDER ARTHROPLASTY    . UMBILICAL HERNIA REPAIR  03/28/2002  . WRIST SURGERY  2020    Current Outpatient Medications  Medication Sig Dispense Refill  . acidophilus (RISAQUAD) CAPS Take 1 capsule by mouth every morning.     Marland Kitchen amoxicillin (AMOXIL) 500 MG capsule Take 2,000 mg by mouth as needed. Before dental procedures     . aspirin 81 MG tablet Take 81 mg by mouth daily.     Marland Kitchen atorvastatin (LIPITOR) 20 MG tablet Take 20 mg by mouth every evening.     Marland Kitchen  azelastine (ASTELIN) 0.1 % nasal spray Place 1 spray into both nostrils as needed for rhinitis. Use in each nostril as directed    . baclofen (LIORESAL) 10 MG tablet Take 10-20 mg by mouth See admin instructions. Weekly as needed    . chlorproMAZINE (THORAZINE) 25 MG tablet Take 25-50 mg by mouth See admin instructions. every 3 hours as needed max twice month  1  . diclofenac sodium (VOLTAREN) 1 % GEL Apply three grams to three large joints up to three times daily (Patient taking differently: Apply 2 g topically as needed. Apply to Knee, fingers, elbow and left shoulder) 3 Tube 3  . DULoxetine (CYMBALTA) 60 MG capsule Take 60 mg by mouth daily.   3  . ferrous sulfate 325 (65 FE) MG tablet Take 325 mg by mouth 2 (two) times a week.    . furosemide  (LASIX) 40 MG tablet TAKE 1 TABLET BY MOUTH DAILY (Patient taking differently: Take 20 mg by mouth as needed. ) 90 tablet 3  . HYDROcodone-acetaminophen (NORCO/VICODIN) 5-325 MG per tablet Take 1 tablet by mouth as needed for moderate pain.     . hydrOXYzine (ATARAX/VISTARIL) 25 MG tablet Take 25 mg by mouth as directed.    . irbesartan (AVAPRO) 75 MG tablet Take 37.5 mg by mouth. Take only if blood pressure above 130    . loratadine (CLARITIN) 10 MG tablet Take 10 mg by mouth daily.     Marland Kitchen LORazepam (ATIVAN) 0.5 MG tablet Take 0.5 mg by mouth as needed for anxiety.     . meloxicam (MOBIC) 15 MG tablet Take 15 mg by mouth daily.   3  . metFORMIN (GLUCOPHAGE) 500 MG tablet Take 500 mg by mouth daily with breakfast.     . mometasone (NASONEX) 50 MCG/ACT nasal spray Place 2 sprays into the nose as needed.    . Multiple Vitamin (MULITIVITAMIN WITH MINERALS) TABS Take 1 tablet by mouth daily.    . mupirocin nasal ointment (BACTROBAN) 2 % Place 1 application into the nose 2 (two) times daily. Use one-half of tube in each . After application, press sides of nose together and gently massage.    . NON FORMULARY at bedtime. CPAP    . Olopatadine HCl (PATADAY OP) Place 1 drop into both eyes as needed.     Marland Kitchen omeprazole (PRILOSEC) 20 MG capsule Take 20 mg by mouth 2 (two) times daily.     . ONE TOUCH ULTRA TEST test strip     . ONETOUCH DELICA LANCETS 99991111 MISC     . potassium chloride SA (K-DUR,KLOR-CON) 20 MEQ tablet Take 40 mEq by mouth 2 (two) times daily.   3  . PROAIR HFA 108 (90 Base) MCG/ACT inhaler Inhale 2 puffs into the lungs as needed for shortness of breath.   0  . QVAR REDIHALER 40 MCG/ACT inhaler Inhale 2 puffs into the lungs daily at 12 noon.   5  . Secukinumab, 300 MG Dose, (COSENTYX SENSOREADY, 300 MG,) 150 MG/ML SOAJ Inject 300 mg into the skin every 28 (twenty-eight) days. (Patient taking differently: Inject 150 mg into the skin every 14 (fourteen) days. ) 6 pen 0  . topiramate (TOPAMAX) 100  MG tablet Take 300 mg by mouth at bedtime.    . vitamin B-12 (CYANOCOBALAMIN) 500 MCG tablet Take 500 mcg by mouth daily as needed.      No current facility-administered medications for this visit.    Allergies as of 11/13/2019  . (No Known Allergies)  Family History  Problem Relation Age of Onset  . Diabetes Mother   . Hypertension Mother   . Kidney disease Father   . Hypertension Father   . Diabetes Father   . Breast cancer Sister   . Diabetes Sister   . Breast cancer Sister   . Cancer Maternal Grandmother   . Diabetes Maternal Grandmother   . Hypertension Maternal Grandmother   . Diabetes Paternal Grandfather   . Cancer Paternal Grandfather   . Coronary artery disease Sister   . Diabetes Sister     Social History   Socioeconomic History  . Marital status: Married    Spouse name: Jenny Reichmann  . Number of children: 5  . Years of education: some college  . Highest education level: Not on file  Occupational History  . Occupation: Disabled  Tobacco Use  . Smoking status: Former Smoker    Packs/day: 0.50    Years: 20.00    Pack years: 10.00    Types: Cigarettes    Quit date: 10/02/1999    Years since quitting: 20.1  . Smokeless tobacco: Never Used  Substance and Sexual Activity  . Alcohol use: No  . Drug use: No  . Sexual activity: Yes    Birth control/protection: None  Other Topics Concern  . Not on file  Social History Narrative   Lives with husband, Jenny Reichmann   Caffeine use: 1 cup coffee every morning   Right handed    Social Determinants of Health   Financial Resource Strain:   . Difficulty of Paying Living Expenses: Not on file  Food Insecurity:   . Worried About Charity fundraiser in the Last Year: Not on file  . Ran Out of Food in the Last Year: Not on file  Transportation Needs:   . Lack of Transportation (Medical): Not on file  . Lack of Transportation (Non-Medical): Not on file  Physical Activity:   . Days of Exercise per Week: Not on file  .  Minutes of Exercise per Session: Not on file  Stress:   . Feeling of Stress : Not on file  Social Connections:   . Frequency of Communication with Friends and Family: Not on file  . Frequency of Social Gatherings with Friends and Family: Not on file  . Attends Religious Services: Not on file  . Active Member of Clubs or Organizations: Not on file  . Attends Archivist Meetings: Not on file  . Marital Status: Not on file  Intimate Partner Violence:   . Fear of Current or Ex-Partner: Not on file  . Emotionally Abused: Not on file  . Physically Abused: Not on file  . Sexually Abused: Not on file     Physical Exam: BP 128/80   Pulse 88   Temp (!) 97.2 F (36.2 C)   Ht 5\' 2"  (1.575 m)   Wt 270 lb (122.5 kg)   BMI 49.38 kg/m  Constitutional: generally well-appearing Psychiatric: alert and oriented x3 Eyes: extraocular movements intact Mouth: oral pharynx moist, no lesions Neck: supple no lymphadenopathy Cardiovascular: heart regular rate and rhythm Lungs: clear to auscultation bilaterally Abdomen: soft, nontender, nondistended, no obvious ascites, no peritoneal signs, normal bowel sounds Extremities: no lower extremity edema bilaterally Skin: no lesions on visible extremities   Assessment and plan: 59 y.o. female with morbid obesity, Hemoccult positive stool  Her BMI today is 49.38.  I recommended a colonoscopy at her soonest convenience given her Hemoccult positive stool.  I explained to her  that it is unlikely will find any thing serious but certainly should be evaluated.  If no polyps or cancers are found then she would not need repeat colon cancer screening for 10 years, this would preclude her need for any future annual Hemoccult testing   Please see the "Patient Instructions" section for addition details about the plan.   Owens Loffler, MD [facility] 11/13/2019, 9:16 AM  Cc: Shon Baton, MD  Total time on date of encounter was 30  minutes  (this included time spent preparing to see the patient reviewing records; obtaining and/or reviewing separately obtained history; performing a medically appropriate exam and/or evaluation; counseling and educating the patient and family if present; ordering medications, tests or procedures if applicable; and documenting clinical information in the health record).

## 2019-11-25 ENCOUNTER — Telehealth: Payer: Self-pay | Admitting: Gastroenterology

## 2019-11-25 NOTE — Progress Notes (Unsigned)
Family history has been added

## 2019-11-25 NOTE — Telephone Encounter (Signed)
Family history has been added to the pt history as requested.

## 2019-11-25 NOTE — Telephone Encounter (Signed)
Pt reported that she has an aunt and two cousins with Crohn's disease.  She would like to update this in her chart.  FYI.

## 2019-12-16 ENCOUNTER — Other Ambulatory Visit: Payer: Self-pay | Admitting: *Deleted

## 2019-12-16 DIAGNOSIS — Z79899 Other long term (current) drug therapy: Secondary | ICD-10-CM | POA: Diagnosis not present

## 2019-12-16 DIAGNOSIS — Z9225 Personal history of immunosupression therapy: Secondary | ICD-10-CM | POA: Diagnosis not present

## 2019-12-16 DIAGNOSIS — Z111 Encounter for screening for respiratory tuberculosis: Secondary | ICD-10-CM | POA: Diagnosis not present

## 2019-12-17 NOTE — Progress Notes (Signed)
Labs are stable.

## 2019-12-18 ENCOUNTER — Ambulatory Visit: Payer: Medicare HMO | Attending: Internal Medicine

## 2019-12-18 DIAGNOSIS — Z23 Encounter for immunization: Secondary | ICD-10-CM

## 2019-12-18 LAB — COMPLETE METABOLIC PANEL WITH GFR
AG Ratio: 1.4 (calc) (ref 1.0–2.5)
ALT: 15 U/L (ref 6–29)
AST: 12 U/L (ref 10–35)
Albumin: 4.2 g/dL (ref 3.6–5.1)
Alkaline phosphatase (APISO): 113 U/L (ref 37–153)
BUN: 14 mg/dL (ref 7–25)
CO2: 24 mmol/L (ref 20–32)
Calcium: 9.5 mg/dL (ref 8.6–10.4)
Chloride: 109 mmol/L (ref 98–110)
Creat: 0.89 mg/dL (ref 0.50–1.05)
GFR, Est African American: 83 mL/min/{1.73_m2} (ref >=60)
GFR, Est Non African American: 71 mL/min/{1.73_m2} (ref >=60)
Globulin: 2.9 g/dL (calc) (ref 1.9–3.7)
Glucose, Bld: 81 mg/dL (ref 65–99)
Potassium: 4.6 mmol/L (ref 3.5–5.3)
Sodium: 142 mmol/L (ref 135–146)
Total Bilirubin: 0.3 mg/dL (ref 0.2–1.2)
Total Protein: 7.1 g/dL (ref 6.1–8.1)

## 2019-12-18 LAB — CBC WITH DIFFERENTIAL/PLATELET
Absolute Monocytes: 629 cells/uL (ref 200–950)
Basophils Absolute: 34 cells/uL (ref 0–200)
Basophils Relative: 0.4 %
Eosinophils Absolute: 187 cells/uL (ref 15–500)
Eosinophils Relative: 2.2 %
HCT: 39.4 % (ref 35.0–45.0)
Hemoglobin: 12.5 g/dL (ref 11.7–15.5)
Lymphs Abs: 1913 cells/uL (ref 850–3900)
MCH: 26.3 pg — ABNORMAL LOW (ref 27.0–33.0)
MCHC: 31.7 g/dL — ABNORMAL LOW (ref 32.0–36.0)
MCV: 82.8 fL (ref 80.0–100.0)
MPV: 9.2 fL (ref 7.5–12.5)
Monocytes Relative: 7.4 %
Neutro Abs: 5738 cells/uL (ref 1500–7800)
Neutrophils Relative %: 67.5 %
Platelets: 246 10*3/uL (ref 140–400)
RBC: 4.76 10*6/uL (ref 3.80–5.10)
RDW: 14.6 % (ref 11.0–15.0)
Total Lymphocyte: 22.5 %
WBC: 8.5 10*3/uL (ref 3.8–10.8)

## 2019-12-18 LAB — QUANTIFERON-TB GOLD PLUS
Mitogen-NIL: >10 IU/mL
NIL: 0.02 IU/mL
QuantiFERON-TB Gold Plus: NEGATIVE
TB1-NIL: 0.02 IU/mL
TB2-NIL: 0.01 IU/mL

## 2019-12-18 NOTE — Progress Notes (Signed)
   Covid-19 Vaccination Clinic  Name:  Carry Stebelton    MRN: [id number] DOB: Jan 04, 1961  12/18/2019  Ms. Hilliker was observed post Covid-19 immunization for 15 minutes without incident. She was provided with Vaccine Information Sheet and instruction to access the V-Safe system.   Ms. Carlisle was instructed to call 911 with any severe reactions post vaccine: Marland Kitchen Difficulty breathing  . Swelling of face and throat  . A fast heartbeat  . A bad rash all over body  . Dizziness and weakness   Immunizations Administered    Name Date Dose VIS Date Route   Pfizer COVID-19 Vaccine 12/18/2019  9:06 AM 0.3 mL 08/23/2019 Intramuscular   Manufacturer: [employer], [employer]   Lot: [id number]   [place]: [id number]

## 2019-12-20 ENCOUNTER — Ambulatory Visit (INDEPENDENT_AMBULATORY_CARE_PROVIDER_SITE_OTHER): Payer: Medicare HMO

## 2019-12-20 ENCOUNTER — Other Ambulatory Visit: Payer: Self-pay | Admitting: Gastroenterology

## 2019-12-20 DIAGNOSIS — Z1159 Encounter for screening for other viral diseases: Secondary | ICD-10-CM

## 2019-12-20 LAB — SARS CORONAVIRUS 2 (TAT 6-24 HRS): SARS Coronavirus 2: NEGATIVE

## 2019-12-24 ENCOUNTER — Encounter: Payer: Self-pay | Admitting: Gastroenterology

## 2019-12-24 ENCOUNTER — Ambulatory Visit (AMBULATORY_SURGERY_CENTER): Payer: Medicare HMO | Admitting: Gastroenterology

## 2019-12-24 ENCOUNTER — Other Ambulatory Visit: Payer: Self-pay

## 2019-12-24 VITALS — BP 142/85 | HR 89 | Temp 96.9°F | Resp 19 | Ht 62.0 in | Wt 270.0 lb

## 2019-12-24 DIAGNOSIS — K635 Polyp of colon: Secondary | ICD-10-CM

## 2019-12-24 DIAGNOSIS — R195 Other fecal abnormalities: Secondary | ICD-10-CM | POA: Diagnosis not present

## 2019-12-24 DIAGNOSIS — D123 Benign neoplasm of transverse colon: Secondary | ICD-10-CM

## 2019-12-24 MED ORDER — SODIUM CHLORIDE 0.9 % IV SOLN: 500.0000 mL | INTRAVENOUS | Status: DC

## 2019-12-24 NOTE — Patient Instructions (Signed)
Discharge instructions given. Handout on polyps. Resume previous medications. YOU HAD AN ENDOSCOPIC PROCEDURE TODAY AT THE [facility]:   Refer to the procedure report that was given to you for any specific questions about what was found during the examination.  If the procedure report does not answer your questions, please call your gastroenterologist to clarify.  If you requested that your care partner not be given the details of your procedure findings, then the procedure report has been included in a sealed envelope for you to review at your convenience later.  YOU SHOULD EXPECT: Some feelings of bloating in the abdomen. Passage of more gas than usual.  Walking can help get rid of the air that was put into your GI tract during the procedure and reduce the bloating. If you had a lower endoscopy (such as a colonoscopy or flexible sigmoidoscopy) you may notice spotting of blood in your stool or on the toilet paper. If you underwent a bowel prep for your procedure, you may not have a normal bowel movement for a few days.  Please Note:  You might notice some irritation and congestion in your nose or some drainage.  This is from the oxygen used during your procedure.  There is no need for concern and it should clear up in a day or so.  SYMPTOMS TO REPORT IMMEDIATELY:  Following lower endoscopy (colonoscopy or flexible sigmoidoscopy):  Excessive amounts of blood in the stool  Significant tenderness or worsening of abdominal pains  Swelling of the abdomen that is new, acute  Fever of 100F or higher   For urgent or emergent issues, a gastroenterologist can be reached at any hour by calling [phone]. Do not use MyChart messaging for urgent concerns.    DIET:  We do recommend a small meal at first, but then you may proceed to your regular diet.  Drink plenty of fluids but you should avoid alcoholic beverages for 24 hours.  ACTIVITY:  You should plan to take it easy for the rest  of today and you should NOT DRIVE or use heavy machinery until tomorrow (because of the sedation medicines used during the test).    FOLLOW UP: Our staff will call the number listed on your records 48-72 hours following your procedure to check on you and address any questions or concerns that you may have regarding the information given to you following your procedure. If we do not reach you, we will leave a message.  We will attempt to reach you two times.  During this call, we will ask if you have developed any symptoms of COVID 19. If you develop any symptoms (ie: fever, flu-like symptoms, shortness of breath, cough etc.) before then, please call [phone].  If you test positive for Covid 19 in the 2 weeks post procedure, please call and report this information to us.    If any biopsies were taken you will be contacted by phone or by letter within the next 1-3 weeks.  Please call us at [phone] if you have not heard about the biopsies in 3 weeks.    SIGNATURES/CONFIDENTIALITY: You and/or your care partner have signed paperwork which will be entered into your electronic medical record.  These signatures attest to the fact that that the information above on your After Visit Summary has been reviewed and is understood.  Full responsibility of the confidentiality of this discharge information lies with you and/or your care-partner.  

## 2019-12-24 NOTE — Progress Notes (Signed)
Pt Drowsy. VSS. To PACU, report to RN. No anesthetic complications noted.  

## 2019-12-24 NOTE — Progress Notes (Signed)
Temp JB V/s CW  

## 2019-12-24 NOTE — Op Note (Addendum)
[place] Patient Name: [name] Procedure Date: 12/24/2019 7:10 AM MRN: [id number] Endoscopist: Milus Banister , MD Age: 59 Referring MD:  Date of Birth: [birth year] Gender: Female Account #: [id number] Procedure:                Colonoscopy Indications:              Heme positive stool Medicines:                Monitored Anesthesia Care Procedure:                Pre-Anesthesia Assessment:                           - Prior to the procedure, a History and Physical                            was performed, and patient medications and                            allergies were reviewed. The patient's tolerance of                            previous anesthesia was also reviewed. The risks                            and benefits of the procedure and the sedation                            options and risks were discussed with the patient.                            All questions were answered, and informed consent                            was obtained. Prior Anticoagulants: The patient has                            taken no previous anticoagulant or antiplatelet                            agents. ASA Grade Assessment: III - A patient with                            severe systemic disease. After reviewing the risks                            and benefits, the patient was deemed in                            satisfactory condition to undergo the procedure.                           After obtaining informed consent, the colonoscope  was passed under direct vision. Throughout the                            procedure, the patient's blood pressure, pulse, and                            oxygen saturations were monitored continuously. The                            Colonoscope was introduced through the anus and                            advanced to the the cecum, identified by                            appendiceal orifice and ileocecal valve. The                             colonoscopy was performed without difficulty. The                            patient tolerated the procedure well. The quality                            of the bowel preparation was good. The ileocecal                            valve, appendiceal orifice, and rectum were                            photographed. Scope In: 8:17:42 AM Scope Out: 8:27:28 AM Scope Withdrawal Time: 0 hours 7 minutes 58 seconds  Total Procedure Duration: 0 hours 9 minutes 46 seconds  Findings:                 A 3 mm polyp was found in the transverse colon. The                            polyp was sessile. The polyp was removed with a                            cold snare. Resection and retrieval were complete.                           The exam was otherwise without abnormality on                            direct and retroflexion views. Complications:            No immediate complications. Estimated blood loss:                            None. Estimated Blood Loss:     Estimated blood loss: none. Impression:               -  One 3 mm polyp in the transverse colon, removed                            with a cold snare. Resected and retrieved.                           - The examination was otherwise normal on direct                            and retroflexion views. Recommendation:           - Patient has a contact number available for                            emergencies. The signs and symptoms of potential                            delayed complications were discussed with the                            patient. Return to normal activities tomorrow.                            Written discharge instructions were provided to the                            patient.                           - Resume previous diet.                           - Continue present medications.                           - Await pathology results. Milus Banister, MD 12/24/2019 8:29:32 AM This report has  been signed electronically.

## 2019-12-24 NOTE — Progress Notes (Signed)
Called to room to assist during endoscopic procedure.  Patient ID and intended procedure confirmed with present staff. Received instructions for my participation in the procedure from the performing physician.  

## 2019-12-26 ENCOUNTER — Telehealth: Payer: Self-pay

## 2019-12-26 NOTE — Telephone Encounter (Signed)
  Follow up Call-  Call back number 12/24/2019  Post procedure Call Back phone  # [phone]  Permission to leave phone message Yes  Some recent data might be hidden     Patient questions:  Do you have a fever, pain , or abdominal swelling? No. Pain Score  0 *  Have you tolerated food without any problems? Yes.    Have you been able to return to your normal activities? Yes.    Do you have any questions about your discharge instructions: Diet   No. Medications  No. Follow up visit  No.  Do you have questions or concerns about your Care? No.  Actions: * If pain score is 4 or above: No action needed, pain <4  1. Have you developed a fever since your procedure? no  2.   Have you had an respiratory symptoms (SOB or cough) since your procedure? no  3.   Have you tested positive for COVID 19 since your procedure no  4.   Have you had any family members/close contacts diagnosed with the COVID 19 since your procedure?  no   If yes to any of these questions please route to Joylene John, RN and Erenest Rasher, RN

## 2019-12-27 ENCOUNTER — Encounter: Payer: Self-pay | Admitting: Gastroenterology

## 2019-12-31 NOTE — Progress Notes (Signed)
Office Visit Note  Patient: [name]             Date of Birth: September 24, 1960           MRN: [id number]             PCP: Shon Baton, MD Referring: Shon Baton, MD Visit Date: 01/10/2020 Occupation: '@GUAROCC'$ @  Subjective:  Pain in multiple joints   History of Present Illness: [name] is a 59 y.o. female with history of spondyloarthropathy and DDD.  Patient is on Cosentyx 150 mg subcutaneous injections every 14 days.  She has not missed any doses of Cosentyx recently.  She has not had any recent infections.  She has received both COVID-19 vaccinations.  She continues to have intermittent lower back pain especially in the left SI joint.  She is not having any discomfort at this time.  She has been experiencing increased cramping in both hands and has been using Voltaren gel as well as massage to alleviate the discomfort.  She denies any joint swelling at this time.  She denies any Achilles tendinitis or plantar fasciitis.  She has been experiencing left trochanter bursitis which is worse when transitioning from a seated to standing position.  She has been walking on a daily basis for exercise.  She has been experiencing increased pain in bilateral knee replacements.  Her knees were replaced about 10 to 11 years ago by Dr. Noemi Chapel.  She plans on following up at [facility] orthopedics.   Activities of Daily Living:  Patient reports morning stiffness for 15-30 minutes.   Patient Reports nocturnal pain.  Difficulty dressing/grooming: Denies Difficulty climbing stairs: Reports Difficulty getting out of chair: Reports Difficulty using hands for taps, buttons, cutlery, and/or writing: Reports  Review of Systems  Constitutional: Positive for fatigue.  HENT: Negative for mouth sores, mouth dryness and nose dryness.   Eyes: Positive for dryness. Negative for pain and visual disturbance.  Respiratory: Negative for cough, hemoptysis, shortness of breath and difficulty  breathing.   Cardiovascular: Positive for swelling in legs/feet. Negative for chest pain, palpitations and hypertension.  Gastrointestinal: Negative for blood in stool, constipation and diarrhea.  Endocrine: Negative for increased urination.  Genitourinary: Negative for painful urination.  Musculoskeletal: Positive for arthralgias, joint pain and morning stiffness. Negative for joint swelling, myalgias, muscle weakness, muscle tenderness and myalgias.  Skin: Negative for color change, pallor, rash, hair loss, nodules/bumps, skin tightness, ulcers and sensitivity to sunlight.  Allergic/Immunologic: Negative for susceptible to infections.  Neurological: Negative for dizziness, numbness, headaches and weakness.  Hematological: Positive for bruising/bleeding tendency. Negative for swollen glands.  Psychiatric/Behavioral: Negative for depressed mood and sleep disturbance. The patient is not nervous/anxious.     PMFS History:  Patient Active Problem List   Diagnosis Date Noted  . De Quervain's disease (radial styloid tenosynovitis) 11/27/2018  . Vertigo 10/18/2018  . Common migraine with intractable migraine 07/06/2018  . Diabetes mellitus type 2 in obese ([place]) 06/29/2018  . Spondyloarthropathy 07/26/2016  . High risk medication use 07/26/2016  . Chronic low back pain 07/26/2016  . H/O total knee replacement, bilateral 07/26/2016  . Chronic diastolic heart failure ([place]) 11/29/2015  . Dyspnea 06/03/2015  . Lung nodule seen on imaging study, CTA of chest, 6 mm nodule, followed by Dr. Virgina Jock 03/25/2013  . Family history of coronary artery disease 03/12/2013  . Super obese 03/12/2013  . Chest pain with low risk of acute coronary syndrome - most likely musculoskeletal, negative nuc study 03/11/2013  .  Full thickness rotator cuff tear 05/15/2012  . Hypercholesterolemia   . DJD (degenerative joint disease), cervical   . OSA (obstructive sleep apnea)   . Essential hypertension   . History of TIA  (transient ischemic attack) 9/13   . Partial tear of rotator cuff(726.13)   . Shoulder impingement 04/12/2012    Past Medical History:  Diagnosis Date  . Anemia    takes iron supplement  . Anxiety   . Asthma    daily and prn inhalers  . Bone spur    Left foot  . Chest pain    a. 10/2015: NST showing possible mild ischemia but most consistent with breast attenuation. Low-risk study.   . Chronic diastolic CHF (congestive heart failure) ([place])   . Common migraine with intractable migraine 07/06/2018  . DDD (degenerative disc disease), cervical   . Degenerative disc disease   . Depression   . Diabetes mellitus ([place])   . Essential hypertension   . Family history of early CAD   . Former tobacco use   . GERD (gastroesophageal reflux disease)   . GERD (gastroesophageal reflux disease)   . Headache(784.0)    migraine-like  . History of TIA (transient ischemic attack) early 2013   no weakness or deficits  . History of UTI   . Hyperlipidemia   . Insomnia   . Migraines   . Morbid obesity ([place])   . Partial tear of rotator cuff(726.13)   . Psoriatic arthritis ([place])   . Shoulder impingement 04/2012   left  . Sleep apnea    uses CPAP nightly    Family History  Problem Relation Age of Onset  . Diabetes Mother   . Hypertension Mother   . Kidney disease Father   . Hypertension Father   . Diabetes Father   . Breast cancer Sister   . Diabetes Sister   . Breast cancer Sister   . Cancer Maternal Grandmother   . Diabetes Maternal Grandmother   . Hypertension Maternal Grandmother   . Diabetes Paternal Grandfather   . Cancer Paternal Grandfather   . Coronary artery disease Sister   . Diabetes Sister   . Crohn's disease Maternal Aunt   . Crohn's disease Cousin   . Crohn's disease Cousin   . Crohn's disease Paternal Aunt    Past Surgical History:  Procedure Laterality Date  . ABDOMINAL HYSTERECTOMY  03/28/2002   complete  . ANTERIOR CERVICAL DECOMP/DISCECTOMY FUSION  02/18/2010    C6-7  . BREAST BIOPSY Right   . DILATION AND CURETTAGE OF UTERUS     2000  . FOOT SURGERY Right 2015  . KNEE ARTHROPLASTY    . ROTATOR CUFF REPAIR Left 2013  . TOTAL KNEE ARTHROPLASTY  09/07/2009   left  . TOTAL KNEE ARTHROPLASTY  10/13/2008   right  . TOTAL SHOULDER ARTHROPLASTY    . UMBILICAL HERNIA REPAIR  03/28/2002  . wisidom    . WRIST SURGERY  2020   Social History   Social History Narrative   Lives with husband, Jenny Reichmann   Caffeine use: 1 cup coffee every morning   Right handed    Immunization History  Administered Date(s) Administered  . Influenza-Unspecified 06/05/2018  . PFIZER SARS-COV-2 Vaccination 12/18/2019     Objective: Vital Signs: BP 132/78 (BP Location: Left Arm, Patient Position: Sitting, Cuff Size: Normal)   Pulse 93   Resp 16   Ht '5\' 2"'$  (1.575 m)   Wt 277 lb (125.6 kg)   BMI 50.66 kg/m  Physical Exam Vitals and nursing note reviewed.  Constitutional:      Appearance: She is well-developed.  HENT:     Head: Normocephalic and atraumatic.  Eyes:     Conjunctiva/sclera: Conjunctivae normal.  Pulmonary:     Effort: Pulmonary effort is normal.  Abdominal:     General: Bowel sounds are normal.     Palpations: Abdomen is soft.  Musculoskeletal:     Cervical back: Normal range of motion.  Lymphadenopathy:     Cervical: No cervical adenopathy.  Skin:    General: Skin is warm and dry.     Capillary Refill: Capillary refill takes less than 2 seconds.  Neurological:     Mental Status: She is alert and oriented to person, place, and time.  Psychiatric:        Behavior: Behavior normal.      Musculoskeletal Exam: C-spine good range of motion with no discomfort.  Trapezius muscle tension and muscle tenderness bilaterally.  [place] and lumbar spine have good range of motion.  No midline spinal tenderness.  SI joint tenderness.  Shoulder joints, elbow joints, wrist joints, MCPs, PIPs and DIPs good range of motion with no synovitis.  She has complete fist  formation bilaterally.  Tenderness of the left first MCP joint.  Hip joints have good range of motion with no discomfort.  She has tenderness over the left trochanteric bursa.  Ankle joints have good range of motion with no tenderness.  She has pitting edema bilaterally.  No Achilles tendinitis or plantar fasciitis  CDAI Exam: CDAI Score: -- Patient Global: --; Provider Global: -- Swollen: --; Tender: -- Joint Exam 01/10/2020   No joint exam has been documented for this visit   There is currently no information documented on the homunculus. Go to the Rheumatology activity and complete the homunculus joint exam.  Investigation: No additional findings.  Imaging: No results found.  Recent Labs: Lab Results  Component Value Date   WBC 8.5 12/16/2019   HGB 12.5 12/16/2019   PLT 246 12/16/2019   NA 142 12/16/2019   K 4.6 12/16/2019   CL 109 12/16/2019   CO2 24 12/16/2019   GLUCOSE 81 12/16/2019   BUN 14 12/16/2019   CREATININE 0.89 12/16/2019   BILITOT 0.3 12/16/2019   ALKPHOS 119 06/27/2018   AST 12 12/16/2019   ALT 15 12/16/2019   PROT 7.1 12/16/2019   ALBUMIN 3.6 06/27/2018   CALCIUM 9.5 12/16/2019   GFRAA 83 12/16/2019   QFTBGOLDPLUS NEGATIVE 12/16/2019    Speciality Comments: Patient recieves Cosentyx through [employer] 04/29/2019  Procedures:  No procedures performed Allergies: Patient has no known allergies.   Assessment / Plan:     Visit Diagnoses: Spondyloarthropathy - MRI positive for sacroiliitis, HLA-B27 negative, elevated ESR: She has not had any signs or symptoms of a flare recently.  She is clinically doing well on Cosentyx 150 mg subcutaneous injections every 14 days.  She is notices significant improvement since spacing the dose to every 14 days instead of every 28 days.  She has intermittent left SI joint pain but has no tenderness on exam today.  She has not noticed any increased joint stiffness or inflammation.  She has no  synovitis on exam today.  She was given a prescription for Lidoderm patches which she can apply as needed for pain relief.  She will continue on Cosentyx 150 mg IV days injections every 14 days.  She was advised to notify us if she develops signs  or symptoms of a flare.  She will follow-up in the office in 5 months.- Plan: lidocaine (LIDODERM) 5 %  High risk medication use - Cosentyx 150 mg sq injections every 14 days.  Last TB gold negative on 12/16/19.  CBC and CMP were drawn on 12/16/2019.  Lab work was reviewed today in the office.  She will return for lab work in July and every 3 months to monitor for drug toxicity.  CBC and CMP standing orders are in place.  Medial epicondylitis of both elbows - Resolved   Trapezius muscle spasm: She is been experiencing more frequent trapezius muscle tension and muscle tenderness bilaterally.  She has spasms intermittently.  We discussed using a heating pad, Lidoderm patches, and massage for symptomatic relief.  Muscle cramping: She has been experiencing more frequent muscle cramping in her hands, lower extremities, and feet.  We discussed trying magnesium malate 250 mg at bedtime as needed for muscle cramps.  If she tolerates the low-dose she can increase to 500 mg by mouth at bedtime.  We discussed potential side effects.  Trochanteric bursitis, left hip: She has tenderness over the left trochanteric bursa.  She has good range of motion of the left hip joint on exam.  She is not experiencing any groin pain currently.  She has been more sedentary recently which she feels has exacerbated the discomfort.  We discussed the importance of performing daily exercises.  She was given a handout of these exercises to perform.  She can apply Voltaren gel topically as needed.  She was advised to notify us if her symptoms persist or worsen.  DDD (degenerative disc disease), cervical - She has limited range of motion with some discomfort in the C-spine.  She is not experiencing  any symptoms of radiculopathy at this time.  She has been having trapezius muscle tension and muscle tenderness bilaterally.  We discussed the use of Lidoderm patches, heating pad, and massage.  Plan: lidocaine (LIDODERM) 5 %  H/O total knee replacement, bilateral: Chronic pain.  She has painful range of motion with warmth.  No effusion noted.  She is been having increased discomfort in bilateral knee replacements.  According to the patient she had her knees replaced 10 to 11 years ago by Dr. Noemi Chapel.  She was advised to follow-up at [place] for further evaluation.  Other medical conditions are listed as follows:  Lung nodule seen on imaging study, CTA of chest, 6 mm nodule, followed by Dr. Virgina Jock  History of diabetes mellitus  Chronic diastolic heart failure ([place])  History of hyperlipidemia  History of TIA (transient ischemic attack)  History of sleep apnea  History of hypertension   Orders: No orders of the defined types were placed in this encounter.  Meds ordered this encounter  Medications  . lidocaine (LIDODERM) 5 %    Sig: Place 1 patch onto the skin daily. Remove & Discard patch within 12 hours or as directed by MD    Dispense:  30 patch    Refill:  0    Face-to-face time spent with patient was 30 minutes. Greater than 50% of time was spent in counseling and coordination of care.  Follow-Up Instructions: Return in about 5 months (around 06/11/2020) for Spondyloarthropathy, Osteoarthritis.   Ofilia Neas, PA-C  Note - This record has been created using Dragon software.  Chart creation errors have been sought, but may not always  have been located. Such creation errors do not reflect on  the standard  of medical care.

## 2020-01-10 ENCOUNTER — Other Ambulatory Visit: Payer: Self-pay

## 2020-01-10 ENCOUNTER — Encounter: Payer: Self-pay | Admitting: Physician Assistant

## 2020-01-10 ENCOUNTER — Telehealth: Payer: Self-pay

## 2020-01-10 ENCOUNTER — Ambulatory Visit: Payer: Medicare HMO | Admitting: Physician Assistant

## 2020-01-10 VITALS — BP 132/78 | HR 93 | Resp 16 | Ht 62.0 in | Wt 277.0 lb

## 2020-01-10 DIAGNOSIS — M47819 Spondylosis without myelopathy or radiculopathy, site unspecified: Secondary | ICD-10-CM

## 2020-01-10 DIAGNOSIS — M7062 Trochanteric bursitis, left hip: Secondary | ICD-10-CM

## 2020-01-10 DIAGNOSIS — I5032 Chronic diastolic (congestive) heart failure: Secondary | ICD-10-CM | POA: Diagnosis not present

## 2020-01-10 DIAGNOSIS — R911 Solitary pulmonary nodule: Secondary | ICD-10-CM | POA: Diagnosis not present

## 2020-01-10 DIAGNOSIS — R252 Cramp and spasm: Secondary | ICD-10-CM

## 2020-01-10 DIAGNOSIS — M7702 Medial epicondylitis, left elbow: Secondary | ICD-10-CM

## 2020-01-10 DIAGNOSIS — Z8679 Personal history of other diseases of the circulatory system: Secondary | ICD-10-CM

## 2020-01-10 DIAGNOSIS — Z8669 Personal history of other diseases of the nervous system and sense organs: Secondary | ICD-10-CM | POA: Diagnosis not present

## 2020-01-10 DIAGNOSIS — Z8673 Personal history of transient ischemic attack (TIA), and cerebral infarction without residual deficits: Secondary | ICD-10-CM

## 2020-01-10 DIAGNOSIS — Z8639 Personal history of other endocrine, nutritional and metabolic disease: Secondary | ICD-10-CM

## 2020-01-10 DIAGNOSIS — Z79899 Other long term (current) drug therapy: Secondary | ICD-10-CM | POA: Diagnosis not present

## 2020-01-10 DIAGNOSIS — Z96653 Presence of artificial knee joint, bilateral: Secondary | ICD-10-CM

## 2020-01-10 DIAGNOSIS — M7701 Medial epicondylitis, right elbow: Secondary | ICD-10-CM

## 2020-01-10 DIAGNOSIS — M62838 Other muscle spasm: Secondary | ICD-10-CM

## 2020-01-10 DIAGNOSIS — M5030 Other cervical disc degeneration, unspecified cervical region: Secondary | ICD-10-CM | POA: Diagnosis not present

## 2020-01-10 MED ORDER — LIDOCAINE 5 % EX PTCH: 1.0000 | MEDICATED_PATCH | CUTANEOUS | 0 refills | Status: DC

## 2020-01-10 NOTE — Telephone Encounter (Signed)
Submitted PA for lidocaine patches via Cover my meds.   Response received and request is denied. Will send document to scan center.   Attempted to contact patient and left message on machine to advise patient of denial.

## 2020-01-10 NOTE — Patient Instructions (Addendum)
Magnesium malate 250 mg at bedtime  If you tolerate the lower dose you can increase to 500 mg at bedtime  Potential side effects: Drowsiness and diarrhea    Standing Labs We placed an order today for your standing lab work.    Please come back and get your standing labs in July and every 3 months   We have open lab daily Monday through Thursday from 8:30-12:30 PM and 1:30-4:30 PM and Friday from 8:30-12:30 PM and 1:30-4:00 PM at the office of Dr. Bo Merino.   You may experience shorter wait times on Monday and Friday afternoons. The office is located at [address] [place], [place], [place] [address] No appointment is necessary.   Labs are drawn by [facility].  You may receive a bill from [facility] for your lab work.  If you wish to have your labs drawn at another location, please call the office 24 hours in advance to send orders.  If you have any questions regarding directions or hours of operation,  please call [phone].   Just as a reminder please drink plenty of water prior to coming for your lab work. Thanks!   Hand Exercises Hand exercises can be helpful for almost anyone. These exercises can strengthen the hands, improve flexibility and movement, and increase blood flow to the hands. These results can make work and daily tasks easier. Hand exercises can be especially helpful for people who have joint pain from arthritis or have nerve damage from overuse (carpal tunnel syndrome). These exercises can also help people who have injured a hand. Exercises Most of these hand exercises are gentle stretching and motion exercises. It is usually safe to do them often throughout the day. Warming up your hands before exercise may help to reduce stiffness. You can do this with gentle massage or by placing your hands in warm water for 10-15 minutes. It is normal to feel some stretching, pulling, tightness, or mild discomfort as you begin new exercises. This will gradually improve.  Stop an exercise right away if you feel sudden, severe pain or your pain gets worse. Ask your health care provider which exercises are best for you. Knuckle bend or "claw" fist 1. Stand or sit with your arm, hand, and all five fingers pointed straight up. Make sure to keep your wrist straight during the exercise. 2. Gently bend your fingers down toward your palm until the tips of your fingers are touching the top of your palm. Keep your big knuckle straight and just bend the small knuckles in your fingers. 3. Hold this position for __________ seconds. 4. Straighten (extend) your fingers back to the starting position. Repeat this exercise 5-10 times with each hand. Full finger fist 1. Stand or sit with your arm, hand, and all five fingers pointed straight up. Make sure to keep your wrist straight during the exercise. 2. Gently bend your fingers into your palm until the tips of your fingers are touching the middle of your palm. 3. Hold this position for __________ seconds. 4. Extend your fingers back to the starting position, stretching every joint fully. Repeat this exercise 5-10 times with each hand. Straight fist 1. Stand or sit with your arm, hand, and all five fingers pointed straight up. Make sure to keep your wrist straight during the exercise. 2. Gently bend your fingers at the big knuckle, where your fingers meet your hand, and the middle knuckle. Keep the knuckle at the tips of your fingers straight and try to touch the  bottom of your palm. 3. Hold this position for __________ seconds. 4. Extend your fingers back to the starting position, stretching every joint fully. Repeat this exercise 5-10 times with each hand. Tabletop 1. Stand or sit with your arm, hand, and all five fingers pointed straight up. Make sure to keep your wrist straight during the exercise. 2. Gently bend your fingers at the big knuckle, where your fingers meet your hand, as far down as you can while keeping the small  knuckles in your fingers straight. Think of forming a tabletop with your fingers. 3. Hold this position for __________ seconds. 4. Extend your fingers back to the starting position, stretching every joint fully. Repeat this exercise 5-10 times with each hand. Finger spread 1. Place your hand flat on a table with your palm facing down. Make sure your wrist stays straight as you do this exercise. 2. Spread your fingers and thumb apart from each other as far as you can until you feel a gentle stretch. Hold this position for __________ seconds. 3. Bring your fingers and thumb tight together again. Hold this position for __________ seconds. Repeat this exercise 5-10 times with each hand. Making circles 1. Stand or sit with your arm, hand, and all five fingers pointed straight up. Make sure to keep your wrist straight during the exercise. 2. Make a circle by touching the tip of your thumb to the tip of your index finger. 3. Hold for __________ seconds. Then open your hand wide. 4. Repeat this motion with your thumb and each finger on your hand. Repeat this exercise 5-10 times with each hand. Thumb motion 1. Sit with your forearm resting on a table and your wrist straight. Your thumb should be facing up toward the ceiling. Keep your fingers relaxed as you move your thumb. 2. Lift your thumb up as high as you can toward the ceiling. Hold for __________ seconds. 3. Bend your thumb across your palm as far as you can, reaching the tip of your thumb for the small finger (pinkie) side of your palm. Hold for __________ seconds. Repeat this exercise 5-10 times with each hand. Grip strengthening  1. Hold a stress ball or other soft ball in the middle of your hand. 2. Slowly increase the pressure, squeezing the ball as much as you can without causing pain. Think of bringing the tips of your fingers into the middle of your palm. All of your finger joints should bend when doing this exercise. 3. Hold your  squeeze for __________ seconds, then relax. Repeat this exercise 5-10 times with each hand. Contact a health care provider if:  Your hand pain or discomfort gets much worse when you do an exercise.  Your hand pain or discomfort does not improve within 2 hours after you exercise. If you have any of these problems, stop doing these exercises right away. Do not do them again unless your health care provider says that you can. Get help right away if:  You develop sudden, severe hand pain or swelling. If this happens, stop doing these exercises right away. Do not do them again unless your health care provider says that you can. This information is not intended to replace advice given to you by your health care provider. Make sure you discuss any questions you have with your health care provider. Document Revised: 12/20/2018 Document Reviewed: 08/30/2018 Elsevier Patient Education  2020 [employer].    Hip Bursitis Rehab Ask your health care provider which exercises are safe for  you. Do exercises exactly as told by your health care provider and adjust them as directed. It is normal to feel mild stretching, pulling, tightness, or discomfort as you do these exercises. Stop right away if you feel sudden pain or your pain gets worse. Do not begin these exercises until told by your health care provider. Stretching exercise This exercise warms up your muscles and joints and improves the movement and flexibility of your hip. This exercise also helps to relieve pain and stiffness. Iliotibial band stretch An iliotibial band is a strong band of muscle tissue that runs from the outer side of your hip to the outer side of your thigh and knee. 1. Lie on your side with your left / right leg in the top position. 2. Bend your left / right knee and grab your ankle. Stretch out your bottom arm to help you balance. 3. Slowly bring your knee back so your thigh is behind your body. 4. Slowly lower your knee toward  the floor until you feel a gentle stretch on the outside of your left / right thigh. If you do not feel a stretch and your knee will not fall farther, place the heel of your other foot on top of your knee and pull your knee down toward the floor with your foot. 5. Hold this position for __________ seconds. 6. Slowly return to the starting position. Repeat __________ times. Complete this exercise __________ times a day. Strengthening exercises These exercises build strength and endurance in your hip and pelvis. Endurance is the ability to use your muscles for a long time, even after they get tired. Bridge This exercise strengthens the muscles that move your thigh backward (hip extensors). 1. Lie on your back on a firm surface with your knees bent and your feet flat on the floor. 2. Tighten your buttocks muscles and lift your buttocks off the floor until your trunk is level with your thighs. ? Do not arch your back. ? You should feel the muscles working in your buttocks and the back of your thighs. If you do not feel these muscles, slide your feet 1-2 inches (2.5-5 cm) farther away from your buttocks. ? If this exercise is too easy, try doing it with your arms crossed over your chest. 3. Hold this position for __________ seconds. 4. Slowly lower your hips to the starting position. 5. Let your muscles relax completely after each repetition. Repeat __________ times. Complete this exercise __________ times a day. Squats This exercise strengthens the muscles in front of your thigh and knee (quadriceps). 1. Stand in front of a table, with your feet and knees pointing straight ahead. You may rest your hands on the table for balance but not for support. 2. Slowly bend your knees and lower your hips like you are going to sit in a chair. ? Keep your weight over your heels, not over your toes. ? Keep your lower legs upright so they are parallel with the table legs. ? Do not let your hips go lower than  your knees. ? Do not bend lower than told by your health care provider. ? If your hip pain increases, do not bend as low. 3. Hold the squat position for __________ seconds. 4. Slowly push with your legs to return to standing. Do not use your hands to pull yourself to standing. Repeat __________ times. Complete this exercise __________ times a day. Hip hike 1. Stand sideways on a bottom step. Stand on your left / right leg  with your other foot unsupported next to the step. You can hold on to the railing or wall for balance if needed. 2. Keep your knees straight and your torso square. Then lift your left / right hip up toward the ceiling. 3. Hold this position for __________ seconds. 4. Slowly let your left / right hip lower toward the floor, past the starting position. Your foot should get closer to the floor. Do not lean or bend your knees. Repeat __________ times. Complete this exercise __________ times a day. Single leg stand 1. Without shoes, stand near a railing or in a doorway. You may hold on to the railing or door frame as needed for balance. 2. Squeeze your left / right buttock muscles, then lift up your other foot. ? Do not let your left / right hip push out to the side. ? It is helpful to stand in front of a mirror for this exercise so you can watch your hip. 3. Hold this position for __________ seconds. Repeat __________ times. Complete this exercise __________ times a day. This information is not intended to replace advice given to you by your health care provider. Make sure you discuss any questions you have with your health care provider. Document Revised: 12/24/2018 Document Reviewed: 12/24/2018 Elsevier Patient Education  [employer].

## 2020-01-13 DIAGNOSIS — R062 Wheezing: Secondary | ICD-10-CM | POA: Diagnosis not present

## 2020-01-13 DIAGNOSIS — J310 Chronic rhinitis: Secondary | ICD-10-CM | POA: Diagnosis not present

## 2020-01-13 DIAGNOSIS — J383 Other diseases of vocal cords: Secondary | ICD-10-CM | POA: Diagnosis not present

## 2020-01-13 DIAGNOSIS — H1045 Other chronic allergic conjunctivitis: Secondary | ICD-10-CM | POA: Diagnosis not present

## 2020-01-17 ENCOUNTER — Telehealth: Payer: Self-pay | Admitting: Rheumatology

## 2020-01-17 NOTE — Telephone Encounter (Signed)
Patient states she received a denial letter for Lidocaine patches. Patient states the insurance company advised her to contact the office regarding denial. Patient states she was advised that it was denies due to two questions being answered no, patient does not have cancer and she does not have diabetic neuropathy. Patient advised as these are not medical diagnoses she has we are unable to appeal the request.

## 2020-01-17 NOTE — Telephone Encounter (Signed)
Patient left a voicemail stating she just spoke with an agent with her insurance company who told her a prior authorization is needed for her Lidocaine patch.  Patient states she also received information in the mail with the reason it was denied and requested a return call to discuss the results.

## 2020-01-20 ENCOUNTER — Telehealth: Payer: Self-pay | Admitting: Rheumatology

## 2020-01-20 MED ORDER — COSENTYX SENSOREADY (300 MG) 150 MG/ML ~~LOC~~ SOAJ: 150.0000 mg | SUBCUTANEOUS | 0 refills | Status: DC

## 2020-01-20 NOTE — Telephone Encounter (Signed)
Last Visit: 01/10/2020 Next Visit: 06/12/2020 Labs: 12/16/2019 Labs are stable. TB Gold: 12/16/2019 negative   Okay to refill per Dr. Estanislado Pandy.   Advised patient that refill has been sent, patient verbalized understanding.

## 2020-01-20 NOTE — Telephone Encounter (Signed)
Patient called requesting prescription refill of Cosentyx to be sent to [employer] [facility] patient assistance.  Patient states she took her last injection today, 01/20/20.

## 2020-01-28 DIAGNOSIS — R69 Illness, unspecified: Secondary | ICD-10-CM | POA: Diagnosis not present

## 2020-02-03 ENCOUNTER — Telehealth: Payer: Self-pay | Admitting: Rheumatology

## 2020-02-03 NOTE — Telephone Encounter (Signed)
Patient called stating she took her Cosentyx injection this morning and has swelling at the injection site.  Patient states there is no redness and it is not warm to touch.  Patient states this is the first time she has had any swelling and requesting a return call.

## 2020-02-04 DIAGNOSIS — M791 Myalgia, unspecified site: Secondary | ICD-10-CM | POA: Diagnosis not present

## 2020-02-04 DIAGNOSIS — G43709 Chronic migraine without aura, not intractable, without status migrainosus: Secondary | ICD-10-CM | POA: Diagnosis not present

## 2020-02-04 DIAGNOSIS — M542 Cervicalgia: Secondary | ICD-10-CM | POA: Diagnosis not present

## 2020-02-04 DIAGNOSIS — G518 Other disorders of facial nerve: Secondary | ICD-10-CM | POA: Diagnosis not present

## 2020-02-04 NOTE — Telephone Encounter (Signed)
Patient returned call.  Advised patient of prior recommendations.  Patient verbalized understanding.  All questions encouraged and answered.  Instructed patient to call with any further questions or concerns.   Mariella Saa, PharmD, [place], CPP Clinical Specialty Pharmacist (Rheumatology and Pulmonology)  02/04/2020 9:47 AM

## 2020-02-04 NOTE — Telephone Encounter (Signed)
Called patient and no answer.  Left voicemail requesting a return call.    Injection site reactions are not uncommon. It could be that she applied more pressure than normal or hit a blood vessel or vein.  She can use a cold compress 10-15 at the site to see if it helps reduce the swelling.  If she continues to have injection site reactions that get worse overtime she should let the office know.  Also if it ever swells larger than a fist, looks infected, or blisters she should call the office.   Mariella Saa, PharmD, [place], CPP Clinical Specialty Pharmacist (Rheumatology and Pulmonology)  02/04/2020 8:47 AM

## 2020-02-13 ENCOUNTER — Telehealth: Payer: Self-pay | Admitting: Cardiovascular Disease

## 2020-02-13 NOTE — Telephone Encounter (Signed)
New message   STAT if HR is under 50 or over 120 (normal HR is 60-100 beats per minute)  1) What is your heart rate? 111  2) Do you have a log of your heart rate readings (document readings)? Yes   3) Do you have any other symptoms? Feeling faint and weak , left arm was hurting

## 2020-02-13 NOTE — Telephone Encounter (Signed)
Spoke with patient. Patient reports yesterday her HR was 111 and she felt faint. She had some left arm pain yesterday but it has resolved.   Patient has fatigue today but no other symptoms. Patient's HR is 85, BP 130/79. No chest pain or dizziness, no arm pain today.   Patient advised to continue to monitor her vitals signs and symptoms. With patient being asymptomatic right now and after further discussion it was decided no follow up appointment is needed at this time.   Patient will call back if she becomes symptomatic again. Advised to go to ER if she develops symptoms and has chest pain, arm or jaw pain. Patient verbalized understanding.

## 2020-03-03 DIAGNOSIS — M542 Cervicalgia: Secondary | ICD-10-CM | POA: Diagnosis not present

## 2020-03-03 DIAGNOSIS — M791 Myalgia, unspecified site: Secondary | ICD-10-CM | POA: Diagnosis not present

## 2020-03-03 DIAGNOSIS — G43709 Chronic migraine without aura, not intractable, without status migrainosus: Secondary | ICD-10-CM | POA: Diagnosis not present

## 2020-03-03 DIAGNOSIS — G518 Other disorders of facial nerve: Secondary | ICD-10-CM | POA: Diagnosis not present

## 2020-03-10 ENCOUNTER — Other Ambulatory Visit: Payer: Self-pay | Admitting: Internal Medicine

## 2020-03-10 DIAGNOSIS — Z1231 Encounter for screening mammogram for malignant neoplasm of breast: Secondary | ICD-10-CM

## 2020-03-13 ENCOUNTER — Other Ambulatory Visit: Payer: Self-pay

## 2020-03-13 DIAGNOSIS — Z79899 Other long term (current) drug therapy: Secondary | ICD-10-CM | POA: Diagnosis not present

## 2020-03-13 LAB — CBC WITH DIFFERENTIAL/PLATELET
Absolute Monocytes: 638 cells/uL (ref 200–950)
Basophils Absolute: 42 cells/uL (ref 0–200)
Basophils Relative: 0.5 %
Eosinophils Absolute: 202 cells/uL (ref 15–500)
Eosinophils Relative: 2.4 %
HCT: 37.9 % (ref 35.0–45.0)
Hemoglobin: 11.7 g/dL (ref 11.7–15.5)
Lymphs Abs: 1907 cells/uL (ref 850–3900)
MCH: 25.8 pg — ABNORMAL LOW (ref 27.0–33.0)
MCHC: 30.9 g/dL — ABNORMAL LOW (ref 32.0–36.0)
MCV: 83.5 fL (ref 80.0–100.0)
MPV: 10.1 fL (ref 7.5–12.5)
Monocytes Relative: 7.6 %
Neutro Abs: 5611 cells/uL (ref 1500–7800)
Neutrophils Relative %: 66.8 %
Platelets: 240 10*3/uL (ref 140–400)
RBC: 4.54 10*6/uL (ref 3.80–5.10)
RDW: 14.1 % (ref 11.0–15.0)
Total Lymphocyte: 22.7 %
WBC: 8.4 10*3/uL (ref 3.8–10.8)

## 2020-03-13 LAB — COMPLETE METABOLIC PANEL WITH GFR
AG Ratio: 1.3 (calc) (ref 1.0–2.5)
ALT: 13 U/L (ref 6–29)
AST: 12 U/L (ref 10–35)
Albumin: 4 g/dL (ref 3.6–5.1)
Alkaline phosphatase (APISO): 109 U/L (ref 37–153)
BUN: 15 mg/dL (ref 7–25)
CO2: 23 mmol/L (ref 20–32)
Calcium: 9.5 mg/dL (ref 8.6–10.4)
Chloride: 110 mmol/L (ref 98–110)
Creat: 0.89 mg/dL (ref 0.50–1.05)
GFR, Est African American: 83 mL/min/{1.73_m2} (ref >=60)
GFR, Est Non African American: 71 mL/min/{1.73_m2} (ref >=60)
Globulin: 3 g/dL (calc) (ref 1.9–3.7)
Glucose, Bld: 122 mg/dL — ABNORMAL HIGH (ref 65–99)
Potassium: 4.3 mmol/L (ref 3.5–5.3)
Sodium: 140 mmol/L (ref 135–146)
Total Bilirubin: 0.2 mg/dL (ref 0.2–1.2)
Total Protein: 7 g/dL (ref 6.1–8.1)

## 2020-03-15 NOTE — Progress Notes (Signed)
Glucose is mildly elevated, probably not fasting.  CBC is normal.

## 2020-03-27 ENCOUNTER — Emergency Department (HOSPITAL_COMMUNITY)
Admission: EM | Admit: 2020-03-27 | Discharge: 2020-03-28 | Disposition: A | Discharge status: Home / Self Care | Payer: Medicare HMO | Attending: Emergency Medicine | Admitting: Emergency Medicine

## 2020-03-27 ENCOUNTER — Other Ambulatory Visit: Payer: Self-pay

## 2020-03-27 ENCOUNTER — Emergency Department (HOSPITAL_COMMUNITY): Payer: Medicare HMO

## 2020-03-27 ENCOUNTER — Encounter (HOSPITAL_COMMUNITY): Payer: Self-pay

## 2020-03-27 DIAGNOSIS — Y929 Unspecified place or not applicable: Secondary | ICD-10-CM | POA: Insufficient documentation

## 2020-03-27 DIAGNOSIS — J45909 Unspecified asthma, uncomplicated: Secondary | ICD-10-CM | POA: Insufficient documentation

## 2020-03-27 DIAGNOSIS — Z7982 Long term (current) use of aspirin: Secondary | ICD-10-CM | POA: Insufficient documentation

## 2020-03-27 DIAGNOSIS — Y939 Activity, unspecified: Secondary | ICD-10-CM | POA: Diagnosis not present

## 2020-03-27 DIAGNOSIS — Y999 Unspecified external cause status: Secondary | ICD-10-CM | POA: Diagnosis not present

## 2020-03-27 DIAGNOSIS — R519 Headache, unspecified: Secondary | ICD-10-CM | POA: Insufficient documentation

## 2020-03-27 DIAGNOSIS — E119 Type 2 diabetes mellitus without complications: Secondary | ICD-10-CM | POA: Diagnosis not present

## 2020-03-27 DIAGNOSIS — Z96651 Presence of right artificial knee joint: Secondary | ICD-10-CM | POA: Diagnosis not present

## 2020-03-27 DIAGNOSIS — W19XXXA Unspecified fall, initial encounter: Secondary | ICD-10-CM

## 2020-03-27 DIAGNOSIS — J3489 Other specified disorders of nose and nasal sinuses: Secondary | ICD-10-CM | POA: Diagnosis not present

## 2020-03-27 DIAGNOSIS — Z8673 Personal history of transient ischemic attack (TIA), and cerebral infarction without residual deficits: Secondary | ICD-10-CM | POA: Diagnosis not present

## 2020-03-27 DIAGNOSIS — Z96652 Presence of left artificial knee joint: Secondary | ICD-10-CM | POA: Insufficient documentation

## 2020-03-27 DIAGNOSIS — Z87891 Personal history of nicotine dependence: Secondary | ICD-10-CM | POA: Diagnosis not present

## 2020-03-27 DIAGNOSIS — Z7984 Long term (current) use of oral hypoglycemic drugs: Secondary | ICD-10-CM | POA: Insufficient documentation

## 2020-03-27 DIAGNOSIS — S0990XA Unspecified injury of head, initial encounter: Secondary | ICD-10-CM | POA: Diagnosis not present

## 2020-03-27 DIAGNOSIS — W01198A Fall on same level from slipping, tripping and stumbling with subsequent striking against other object, initial encounter: Secondary | ICD-10-CM | POA: Insufficient documentation

## 2020-03-27 DIAGNOSIS — Z79899 Other long term (current) drug therapy: Secondary | ICD-10-CM | POA: Diagnosis not present

## 2020-03-27 DIAGNOSIS — I5032 Chronic diastolic (congestive) heart failure: Secondary | ICD-10-CM | POA: Diagnosis not present

## 2020-03-27 DIAGNOSIS — I10 Essential (primary) hypertension: Secondary | ICD-10-CM | POA: Diagnosis not present

## 2020-03-27 DIAGNOSIS — J323 Chronic sphenoidal sinusitis: Secondary | ICD-10-CM | POA: Diagnosis not present

## 2020-03-27 DIAGNOSIS — I110 Hypertensive heart disease with heart failure: Secondary | ICD-10-CM | POA: Insufficient documentation

## 2020-03-27 NOTE — ED Triage Notes (Signed)
Pt arrives to ED w/ c/o falls. Pt states she had mechanical fall weds and hit the back of her head, denies loc, aox4, neuro intact. Pt fell again today and landed on her knees. Pt c/o 4/10 headache. Pt denies taking blood thinners.

## 2020-03-27 NOTE — ED Notes (Signed)
Patient checked in with nurse with c/o HA. Has current RX for hydrocodone and states that she is going to take one. Pt provided with a cup of water. Pt also states that her provider sent her here for a CT scan for multiple falls. Pt advised that CT scan has been ordered. Pt A&O x4, NAD, gait steady.
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
## 2020-03-28 NOTE — Discharge Instructions (Addendum)
The CT scan of your head showed no acute traumatic injury or bleeding.  Please follow-up with your doctor.

## 2020-04-23 ENCOUNTER — Other Ambulatory Visit: Payer: Self-pay

## 2020-04-23 ENCOUNTER — Ambulatory Visit
Admission: RE | Admit: 2020-04-23 | Discharge: 2020-04-23 | Disposition: A | Discharge status: Home / Self Care | Payer: Medicare HMO | Source: Ambulatory Visit | Attending: Internal Medicine | Admitting: Internal Medicine

## 2020-04-23 DIAGNOSIS — Z1231 Encounter for screening mammogram for malignant neoplasm of breast: Secondary | ICD-10-CM | POA: Diagnosis not present

## 2020-04-24 DIAGNOSIS — R69 Illness, unspecified: Secondary | ICD-10-CM | POA: Diagnosis not present

## 2020-04-29 DIAGNOSIS — G43709 Chronic migraine without aura, not intractable, without status migrainosus: Secondary | ICD-10-CM | POA: Diagnosis not present

## 2020-04-29 DIAGNOSIS — M791 Myalgia, unspecified site: Secondary | ICD-10-CM | POA: Diagnosis not present

## 2020-04-29 DIAGNOSIS — M542 Cervicalgia: Secondary | ICD-10-CM | POA: Diagnosis not present

## 2020-04-29 DIAGNOSIS — G518 Other disorders of facial nerve: Secondary | ICD-10-CM | POA: Diagnosis not present

## 2020-04-30 ENCOUNTER — Other Ambulatory Visit: Payer: Self-pay | Admitting: *Deleted

## 2020-04-30 MED ORDER — COSENTYX SENSOREADY (300 MG) 150 MG/ML ~~LOC~~ SOAJ: 150.0000 mg | SUBCUTANEOUS | 0 refills | Status: DC

## 2020-04-30 NOTE — Telephone Encounter (Signed)
Refill request received via fax  Last Visit: 01/10/2020 Next Visit: 06/12/2020 Labs: 03/13/2020 Glucose is mildly elevated, probably not fasting. CBC is normal. TB Gold: 12/16/2019 Neg   Current Dose per office note 01/10/2020: Cosentyx 150 mg sq injections every 14 days.  DX: Spondyloarthropathy  Okay to refill per Dr. Estanislado Pandy

## 2020-05-07 DIAGNOSIS — M542 Cervicalgia: Secondary | ICD-10-CM | POA: Diagnosis not present

## 2020-05-14 DIAGNOSIS — D8989 Other specified disorders involving the immune mechanism, not elsewhere classified: Secondary | ICD-10-CM | POA: Diagnosis not present

## 2020-05-14 DIAGNOSIS — G894 Chronic pain syndrome: Secondary | ICD-10-CM | POA: Diagnosis not present

## 2020-05-14 DIAGNOSIS — E119 Type 2 diabetes mellitus without complications: Secondary | ICD-10-CM | POA: Diagnosis not present

## 2020-05-14 DIAGNOSIS — R69 Illness, unspecified: Secondary | ICD-10-CM | POA: Diagnosis not present

## 2020-05-14 DIAGNOSIS — M461 Sacroiliitis, not elsewhere classified: Secondary | ICD-10-CM | POA: Diagnosis not present

## 2020-05-14 DIAGNOSIS — I5189 Other ill-defined heart diseases: Secondary | ICD-10-CM | POA: Diagnosis not present

## 2020-05-14 DIAGNOSIS — N182 Chronic kidney disease, stage 2 (mild): Secondary | ICD-10-CM | POA: Diagnosis not present

## 2020-05-14 DIAGNOSIS — I129 Hypertensive chronic kidney disease with stage 1 through stage 4 chronic kidney disease, or unspecified chronic kidney disease: Secondary | ICD-10-CM | POA: Diagnosis not present

## 2020-05-14 DIAGNOSIS — Z1589 Genetic susceptibility to other disease: Secondary | ICD-10-CM | POA: Diagnosis not present

## 2020-05-20 ENCOUNTER — Other Ambulatory Visit: Payer: Self-pay

## 2020-05-20 DIAGNOSIS — Z79899 Other long term (current) drug therapy: Secondary | ICD-10-CM

## 2020-05-21 DIAGNOSIS — M542 Cervicalgia: Secondary | ICD-10-CM | POA: Diagnosis not present

## 2020-05-21 LAB — COMPLETE METABOLIC PANEL WITH GFR
AG Ratio: 1.4 (calc) (ref 1.0–2.5)
ALT: 11 U/L (ref 6–29)
AST: 11 U/L (ref 10–35)
Albumin: 4 g/dL (ref 3.6–5.1)
Alkaline phosphatase (APISO): 106 U/L (ref 37–153)
BUN: 12 mg/dL (ref 7–25)
CO2: 25 mmol/L (ref 20–32)
Calcium: 9.7 mg/dL (ref 8.6–10.4)
Chloride: 108 mmol/L (ref 98–110)
Creat: 0.95 mg/dL (ref 0.50–1.05)
GFR, Est African American: 77 mL/min/{1.73_m2} (ref >=60)
GFR, Est Non African American: 66 mL/min/{1.73_m2} (ref >=60)
Globulin: 2.9 g/dL (calc) (ref 1.9–3.7)
Glucose, Bld: 87 mg/dL (ref 65–99)
Potassium: 4.7 mmol/L (ref 3.5–5.3)
Sodium: 139 mmol/L (ref 135–146)
Total Bilirubin: 0.3 mg/dL (ref 0.2–1.2)
Total Protein: 6.9 g/dL (ref 6.1–8.1)

## 2020-05-21 LAB — CBC WITH DIFFERENTIAL/PLATELET
Absolute Monocytes: 558 cells/uL (ref 200–950)
Basophils Absolute: 41 cells/uL (ref 0–200)
Basophils Relative: 0.5 %
Eosinophils Absolute: 279 cells/uL (ref 15–500)
Eosinophils Relative: 3.4 %
HCT: 38.1 % (ref 35.0–45.0)
Hemoglobin: 12.1 g/dL (ref 11.7–15.5)
Lymphs Abs: 2255 cells/uL (ref 850–3900)
MCH: 26.4 pg — ABNORMAL LOW (ref 27.0–33.0)
MCHC: 31.8 g/dL — ABNORMAL LOW (ref 32.0–36.0)
MCV: 83.2 fL (ref 80.0–100.0)
MPV: 10 fL (ref 7.5–12.5)
Monocytes Relative: 6.8 %
Neutro Abs: 5068 cells/uL (ref 1500–7800)
Neutrophils Relative %: 61.8 %
Platelets: 239 10*3/uL (ref 140–400)
RBC: 4.58 10*6/uL (ref 3.80–5.10)
RDW: 14.2 % (ref 11.0–15.0)
Total Lymphocyte: 27.5 %
WBC: 8.2 10*3/uL (ref 3.8–10.8)

## 2020-05-29 NOTE — Progress Notes (Deleted)
Office Visit Note  Patient: [name]             Date of Birth: [birth year]           MRN: [id number]             PCP: Shon Baton, MD Referring: Shon Baton, MD Visit Date: 06/12/2020 Occupation: @GUAROCC @  Subjective:  No chief complaint on file.   History of Present Illness: [name] is a 59 y.o. female ***   Activities of Daily Living:  Patient reports morning stiffness for *** {minute/hour:19697}.   Patient {ACTIONS;DENIES/REPORTS:21021675::"Denies"} nocturnal pain.  Difficulty dressing/grooming: {ACTIONS;DENIES/REPORTS:21021675::"Denies"} Difficulty climbing stairs: {ACTIONS;DENIES/REPORTS:21021675::"Denies"} Difficulty getting out of chair: {ACTIONS;DENIES/REPORTS:21021675::"Denies"} Difficulty using hands for taps, buttons, cutlery, and/or writing: {ACTIONS;DENIES/REPORTS:21021675::"Denies"}  No Rheumatology ROS completed.   PMFS History:  Patient Active Problem List   Diagnosis Date Noted  . De Quervain's disease (radial styloid tenosynovitis) 11/27/2018  . Vertigo 10/18/2018  . Common migraine with intractable migraine 07/06/2018  . Diabetes mellitus type 2 in obese ([place]) 06/29/2018  . Spondyloarthropathy 07/26/2016  . High risk medication use 07/26/2016  . Chronic low back pain 07/26/2016  . H/O total knee replacement, bilateral 07/26/2016  . Chronic diastolic heart failure ([place]) 11/29/2015  . Dyspnea 06/03/2015  . Lung nodule seen on imaging study, CTA of chest, 6 mm nodule, followed by Dr. Virgina Jock 03/25/2013  . Family history of coronary artery disease 03/12/2013  . Super obese 03/12/2013  . Chest pain with low risk of acute coronary syndrome - most likely musculoskeletal, negative nuc study 03/11/2013  . Full thickness rotator cuff tear 05/15/2012  . Hypercholesterolemia   . DJD (degenerative joint disease), cervical   . OSA (obstructive sleep apnea)   . Essential hypertension   . History of TIA (transient ischemic attack) 9/13    . Partial tear of rotator cuff(726.13)   . Shoulder impingement 04/12/2012    Past Medical History:  Diagnosis Date  . Anemia    takes iron supplement  . Anxiety   . Asthma    daily and prn inhalers  . Bone spur    Left foot  . Chest pain    a. 10/2015: NST showing possible mild ischemia but most consistent with breast attenuation. Low-risk study.   . Chronic diastolic CHF (congestive heart failure) ([place])   . Common migraine with intractable migraine 07/06/2018  . DDD (degenerative disc disease), cervical   . Degenerative disc disease   . Depression   . Diabetes mellitus ([place])   . Essential hypertension   . Family history of early CAD   . Former tobacco use   . GERD (gastroesophageal reflux disease)   . GERD (gastroesophageal reflux disease)   . Headache(784.0)    migraine-like  . History of TIA (transient ischemic attack) early 2013   no weakness or deficits  . History of UTI   . Hyperlipidemia   . Insomnia   . Migraines   . Morbid obesity ([place])   . Partial tear of rotator cuff(726.13)   . Psoriatic arthritis ([place])   . Shoulder impingement 04/2012   left  . Sleep apnea    uses CPAP nightly    Family History  Problem Relation Age of Onset  . Diabetes Mother   . Hypertension Mother   . Kidney disease Father   . Hypertension Father   . Diabetes Father   . Breast cancer Sister   . Diabetes Sister   . Breast cancer Sister   . Cancer  Maternal Grandmother   . Diabetes Maternal Grandmother   . Hypertension Maternal Grandmother   . Diabetes Paternal Grandfather   . Cancer Paternal Grandfather   . Coronary artery disease Sister   . Diabetes Sister   . Crohn's disease Maternal Aunt   . Crohn's disease Cousin   . Crohn's disease Cousin   . Crohn's disease Paternal Aunt    Past Surgical History:  Procedure Laterality Date  . ABDOMINAL HYSTERECTOMY  03/28/2002   complete  . ANTERIOR CERVICAL DECOMP/DISCECTOMY FUSION  02/18/2010   C6-7  . BREAST BIOPSY Right   .  DILATION AND CURETTAGE OF UTERUS     2000  . FOOT SURGERY Right 2015  . KNEE ARTHROPLASTY    . ROTATOR CUFF REPAIR Left 2013  . TOTAL KNEE ARTHROPLASTY  09/07/2009   left  . TOTAL KNEE ARTHROPLASTY  10/13/2008   right  . TOTAL SHOULDER ARTHROPLASTY    . UMBILICAL HERNIA REPAIR  03/28/2002  . wisidom    . WRIST SURGERY  2020   Social History   Social History Narrative   Lives with husband, Jenny Reichmann   Caffeine use: 1 cup coffee every morning   Right handed    Immunization History  Administered Date(s) Administered  . Influenza-Unspecified 06/05/2018  . PFIZER SARS-COV-2 Vaccination 12/18/2019     Objective: Vital Signs: There were no vitals taken for this visit.   Physical Exam   Musculoskeletal Exam: ***  CDAI Exam: CDAI Score: -- Patient Global: --; Provider Global: -- Swollen: --; Tender: -- Joint Exam 06/12/2020   No joint exam has been documented for this visit   There is currently no information documented on the homunculus. Go to the Rheumatology activity and complete the homunculus joint exam.  Investigation: No additional findings.  Imaging: No results found.  Recent Labs: Lab Results  Component Value Date   WBC 8.2 05/20/2020   HGB 12.1 05/20/2020   PLT 239 05/20/2020   NA 139 05/20/2020   K 4.7 05/20/2020   CL 108 05/20/2020   CO2 25 05/20/2020   GLUCOSE 87 05/20/2020   BUN 12 05/20/2020   CREATININE 0.95 05/20/2020   BILITOT 0.3 05/20/2020   ALKPHOS 119 06/27/2018   AST 11 05/20/2020   ALT 11 05/20/2020   PROT 6.9 05/20/2020   ALBUMIN 3.6 06/27/2018   CALCIUM 9.7 05/20/2020   GFRAA 77 05/20/2020   QFTBGOLDPLUS NEGATIVE 12/16/2019    Speciality Comments: Patient recieves Cosentyx through [employer] 04/29/2019  Procedures:  No procedures performed Allergies: Patient has no known allergies.   Assessment / Plan:     Visit Diagnoses: No diagnosis found.  Orders: No orders of the defined types were placed  in this encounter.  No orders of the defined types were placed in this encounter.   Face-to-face time spent with patient was *** minutes. Greater than 50% of time was spent in counseling and coordination of care.  Follow-Up Instructions: No follow-ups on file.   Ofilia Neas, PA-C  Note - This record has been created using Dragon software.  Chart creation errors have been sought, but may not always  have been located. Such creation errors do not reflect on  the standard of medical care.

## 2020-06-02 ENCOUNTER — Ambulatory Visit: Payer: Medicare HMO | Attending: Internal Medicine

## 2020-06-02 DIAGNOSIS — Z23 Encounter for immunization: Secondary | ICD-10-CM

## 2020-06-02 NOTE — Progress Notes (Signed)
   Covid-19 Vaccination Clinic  Name:  [name]    MRN: [id number] DOB: 01-Dec-1960  06/02/2020  [name] was observed post Covid-19 immunization for 15 minutes without incident. She was provided with Vaccine Information Sheet and instruction to access the V-Safe system.   [name] was instructed to call 911 with any severe reactions post vaccine: Marland Kitchen Difficulty breathing  . Swelling of face and throat  . A fast heartbeat  . A bad rash all over body  . Dizziness and weakness

## 2020-06-09 NOTE — Progress Notes (Signed)
Office Visit Note  Patient: [name]             Date of Birth: 1960/10/29           MRN: [id number]             PCP: Shon Baton, MD Referring: Shon Baton, MD Visit Date: 06/11/2020 Occupation: '@[email or web address]' @  Subjective:  Left hip pain   History of Present Illness: [name] is a 59 y.o. female with history of spondyloarthropathy and osteoarthritis.   She is on cosentyx 150 mg sq injections once every 14 days.  She has not missed any doses recently.  She has been experiencing increased discomfort in the groin of the left hip, which started 1.5 to 2 weeks ago.  She denies any injury prior to the onset of symptoms. She denies any lower back pain at this time. She denies any SI joint discomfort.  She has been experiencing an increased discomfort in both hands but denies any joint swelling. She uses voltaren gel topically as needed for pain relief. She has also noticed some increased hand cramping. She was evaluated in the emergency department in July after a fall.  She is continued to have increased discomfort in her neck since then.  She was evaluated by Dr. Ronnald Mathis who started her on Robaxin 750 mg 1 tablet daily as needed for muscle spasms as well as meloxicam 7.5 mg 1 tablet daily for pain relief.  She has found these medications to be helpful at managing her symptoms.   Activities of Daily Living:  Patient reports morning stiffness for 45  minutes.   Patient Reports nocturnal pain.  Difficulty dressing/grooming: Denies Difficulty climbing stairs: Reports Difficulty getting out of chair: Reports Difficulty using hands for taps, buttons, cutlery, and/or writing: Denies  Review of Systems  Constitutional: Negative for fatigue.  HENT: Positive for mouth dryness. Negative for mouth sores and nose dryness.   Eyes: Negative for pain, visual disturbance and dryness.  Respiratory: Negative for cough, hemoptysis, shortness of breath and difficulty breathing.    Cardiovascular: Positive for palpitations. Negative for chest pain, hypertension and swelling in legs/feet.  Gastrointestinal: Negative for blood in stool, constipation and diarrhea.  Endocrine: Negative for increased urination.  Genitourinary: Negative for painful urination.  Musculoskeletal: Positive for arthralgias, joint pain and morning stiffness. Negative for joint swelling, myalgias, muscle weakness, muscle tenderness and myalgias.  Skin: Negative for color change, pallor, rash, hair loss, nodules/bumps, skin tightness, ulcers and sensitivity to sunlight.  Allergic/Immunologic: Negative for susceptible to infections.  Neurological: Negative for dizziness, numbness, headaches and weakness.  Hematological: Negative for swollen glands.  Psychiatric/Behavioral: Positive for sleep disturbance. Negative for depressed mood. The patient is not nervous/anxious.     PMFS History:  Patient Active Problem List   Diagnosis Date Noted  . De Quervain's disease (radial styloid tenosynovitis) 11/27/2018  . Vertigo 10/18/2018  . Common migraine with intractable migraine 07/06/2018  . Diabetes mellitus type 2 in obese ([place]) 06/29/2018  . Spondyloarthropathy 07/26/2016  . High risk medication use 07/26/2016  . Chronic low back pain 07/26/2016  . H/O total knee replacement, bilateral 07/26/2016  . Chronic diastolic heart failure ([place]) 11/29/2015  . Dyspnea 06/03/2015  . Lung nodule seen on imaging study, CTA of chest, 6 mm nodule, followed by Dr. Virgina Mathis 03/25/2013  . Family history of coronary artery disease 03/12/2013  . Super obese 03/12/2013  . Chest pain with low risk of acute coronary syndrome - most likely musculoskeletal, negative  nuc study 03/11/2013  . Full thickness rotator cuff tear 05/15/2012  . Hypercholesterolemia   . DJD (degenerative joint disease), cervical   . OSA (obstructive sleep apnea)   . Essential hypertension   . History of TIA (transient ischemic attack) 9/13   .  Partial tear of rotator cuff(726.13)   . Shoulder impingement 04/12/2012    Past Medical History:  Diagnosis Date  . Anemia    takes iron supplement  . Anxiety   . Asthma    daily and prn inhalers  . Bone spur    Left foot  . Chest pain    a. 10/2015: NST showing possible mild ischemia but most consistent with breast attenuation. Low-risk study.   . Chronic diastolic CHF (congestive heart failure) ([place])   . Common migraine with intractable migraine 07/06/2018  . DDD (degenerative disc disease), cervical   . Degenerative disc disease   . Depression   . Diabetes mellitus ([place])   . Essential hypertension   . Family history of early CAD   . Former tobacco use   . GERD (gastroesophageal reflux disease)   . GERD (gastroesophageal reflux disease)   . Headache(784.0)    migraine-like  . History of TIA (transient ischemic attack) early 2013   no weakness or deficits  . History of UTI   . Hyperlipidemia   . Insomnia   . Migraines   . Morbid obesity ([place])   . Partial tear of rotator cuff(726.13)   . Psoriatic arthritis ([place])   . Shoulder impingement 04/2012   left  . Sleep apnea    uses CPAP nightly    Family History  Problem Relation Age of Onset  . Diabetes Mother   . Hypertension Mother   . Kidney disease Father   . Hypertension Father   . Diabetes Father   . Breast cancer Sister   . Diabetes Sister   . Breast cancer Sister   . Cancer Maternal Grandmother   . Diabetes Maternal Grandmother   . Hypertension Maternal Grandmother   . Diabetes Paternal Grandfather   . Cancer Paternal Grandfather   . Coronary artery disease Sister   . Diabetes Sister   . Crohn's disease Maternal Aunt   . Crohn's disease Cousin   . Crohn's disease Cousin   . Crohn's disease Paternal Aunt    Past Surgical History:  Procedure Laterality Date  . ABDOMINAL HYSTERECTOMY  03/28/2002   complete  . ANTERIOR CERVICAL DECOMP/DISCECTOMY FUSION  02/18/2010   C6-7  . BREAST BIOPSY Right   .  DILATION AND CURETTAGE OF UTERUS     2000  . FOOT SURGERY Right 2015  . KNEE ARTHROPLASTY    . ROTATOR CUFF REPAIR Left 2013  . TOTAL KNEE ARTHROPLASTY  09/07/2009   left  . TOTAL KNEE ARTHROPLASTY  10/13/2008   right  . TOTAL SHOULDER ARTHROPLASTY    . UMBILICAL HERNIA REPAIR  03/28/2002  . wisidom    . WRIST SURGERY  2020   Social History   Social History Narrative   Lives with husband, Jenny Reichmann   Caffeine use: 1 cup coffee every morning   Right handed    Immunization History  Administered Date(s) Administered  . Influenza-Unspecified 06/05/2018  . PFIZER SARS-COV-2 Vaccination 12/18/2019, 06/02/2020     Objective: Vital Signs: BP 130/83 (BP Location: Left Arm, Patient Position: Sitting, Cuff Size: Large)   Pulse 93   Resp 12   Ht '5\' 2"'  (1.575 m)   Wt 272 lb (123.4 kg)  BMI 49.75 kg/m    Physical Exam Vitals and nursing note reviewed.  Constitutional:      Appearance: She is well-developed.  HENT:     Head: Normocephalic and atraumatic.  Eyes:     Conjunctiva/sclera: Conjunctivae normal.  Pulmonary:     Effort: Pulmonary effort is normal.  Abdominal:     Palpations: Abdomen is soft.  Musculoskeletal:     Cervical back: Normal range of motion.  Skin:    General: Skin is warm and dry.     Capillary Refill: Capillary refill takes less than 2 seconds.  Neurological:     Mental Status: She is alert and oriented to person, place, and time.  Psychiatric:        Behavior: Behavior normal.      Musculoskeletal Exam:  C-spine has slightly limited ROM with lateral rotation.  Thoracic and lumbar spine good ROM.  No midline spinal tenderness.  No SI joint tenderness.  Shoulder joints, elbow joints, wrist joints, MCPs, PIPs, and DIPs good ROM with no synovitis.  Complete fist formation bilaterally.  Hip joints have good ROM with some discomfort in the left hip.  Bilateral knee replacements have good ROM with mild warmth but no effusion.  Ankle joints have good ROM with no  discomfort.  Pedal edema bilaterally.  No tenderness over achilles tendons bilaterally.   CDAI Exam: CDAI Score: -- Patient Global: --; Provider Global: -- Swollen: --; Tender: -- Joint Exam 06/11/2020   No joint exam has been documented for this visit   There is currently no information documented on the homunculus. Go to the Rheumatology activity and complete the homunculus joint exam.  Investigation: No additional findings.  Imaging: XR HIP UNILAT W OR W/O PELVIS 2-3 VIEWS LEFT  Result Date: 06/11/2020 No significant hip joint narrowing was noted.  Mild spurring was noted.  No radiographic progression was noted when compared to the x-rays of 2018.  No chondrocalcinosis was noted. Impression: Mild osteoarthritic changes were noted.   Recent Labs: Lab Results  Component Value Date   WBC 8.2 05/20/2020   HGB 12.1 05/20/2020   PLT 239 05/20/2020   NA 139 05/20/2020   K 4.7 05/20/2020   CL 108 05/20/2020   CO2 25 05/20/2020   GLUCOSE 87 05/20/2020   BUN 12 05/20/2020   CREATININE 0.95 05/20/2020   BILITOT 0.3 05/20/2020   ALKPHOS 119 06/27/2018   AST 11 05/20/2020   ALT 11 05/20/2020   PROT 6.9 05/20/2020   ALBUMIN 3.6 06/27/2018   CALCIUM 9.7 05/20/2020   GFRAA 77 05/20/2020   QFTBGOLDPLUS NEGATIVE 12/16/2019    Speciality Comments: Patient recieves Cosentyx through [employer] 04/29/2019  Procedures:  No procedures performed Allergies: Patient has no known allergies.   Assessment / Plan:     Visit Diagnoses: Spondyloarthropathy - MRI positive for sacroiliitis, HLA-B27 negative, elevated ESR: She has not had any signs or symptoms of a flare recently. She experiences intermittent discomfort in her C-spine.  She was started on Robaxin 750 mg 1 tablet daily as needed and meloxicam 7.5 mg daily for pain relief. She is clinically doing well on Cosentyx 150 mg sq injections every 14 days.  She has not missed any doses of Cosentyx recently.   She typically experiences increased discomfort and stiffness leading up to the injection every 2 weeks.  She presents today with increased discomfort in the left hip joint which started 1.5-2 weeks ago.  Her discomfort has been radiating into the groin.  She has good range of motion of the left hip joint on examination today.  X-rays of the left hip were obtained while she was in the office.  She has no SI joint discomfort or tenderness at this time.  No midline spinal tenderness noted.  She has no synovitis or dactylitis on exam.  She continues to have discomfort due to chronic plantar fasciitis but is not experiencing any symptoms of Achilles tendinitis at this time.  She will continue on Cosentyx 150 mg subcu injections every 14 days.  She does not need a refill at this time.  She was advised to notify us if she develops increased joint pain or joint swelling.  She will follow-up in the office in 5 months.  High risk medication use - Cosentyx 150 mg sq injections every 14 days.   CBC and CMP were drawn on 05/20/20.  She will be due to update lab work in December and every 3 months.  Standing orders for CBC and CMP are in place.  TB gold negative on 12/16/19 and will continue to be monitored yearly. She has received all 3 covid-19 vaccine doses.   She was advised to notify us or her PCP if she develops the covid-19 infection in order to receive the antibody infusion.  She voiced understanding.  She is also planning on getting the annual influenza vaccine.   Medial epicondylitis of both elbows - Resolved   DDD (degenerative disc disease), cervical: She has slightly limited range of motion with lateral rotation.  She is followed closely by Dr. Ronnald Mathis.  She had a fall in July 2021 and continues to have residual discomfort.  She was started on Robaxin 750 mg 1 tablet by mouth daily as needed for muscle spasms and meloxicam 7.5 mg 1 tablet daily for pain relief.  She has found these new medications to be helpful in  alleviating her discomfort.  Trapezius muscle spasm: She experiences intermittent trapezius muscle tension and tenderness. She takes robaxin 750 mg 1 tablet by mouth daily as needed for muscle spasms.   Muscle cramping: She experiences intermittent muscle cramping.   Pain in left hip - She presents today with discomfort in her left hip joint.  She has been having radiating pain to the groin starting about 1.5-2 weeks ago.  She did not have any injuries or falls prior to the onset of symptoms.  She has good range of motion of the left hip joint on examination today.  X-rays of the left hip were obtained on 08/14/2017 which revealed mild spurring but no significant hip joint base narrowing.  X-rays of the left hip were updated today while in the office, which do not reveal any progression.  Spurring remains.  She was advised to notify us if her discomfort persists or worsens and we can proceed with a MRI or refer her to ortho to discuss a left hip injection in the future.  Plan: XR HIP UNILAT W OR W/O PELVIS 2-3 VIEWS LEFT  Trochanteric bursitis, left hip: She has no tenderness to palpation on exam.   H/O total knee replacement, bilateral - According to the patient she had her knees replaced 10 to 11 years ago by Dr. Noemi Chapel.  She has been experiencing an intermittent buckling sensation in both knees. She was advised to follow up with Dr. Noemi Chapel for further evaluation.   Other medical conditions are listed as follows:   Lung nodule seen on imaging study, CTA of chest, 6 mm nodule, followed by  Dr. Virgina Mathis  History of hyperlipidemia  History of diabetes mellitus  Chronic diastolic heart failure ([place])  History of sleep apnea  History of TIA (transient ischemic attack)  History of hypertension    Orders: Orders Placed This Encounter  Procedures  . XR HIP UNILAT W OR W/O PELVIS 2-3 VIEWS LEFT   No orders of the defined types were placed in this encounter.    Follow-Up Instructions: Return  in about 5 months (around 11/09/2020) for Spondyloarthropathy.   Ofilia Neas, PA-C  Note - This record has been created using Dragon software.  Chart creation errors have been sought, but may not always  have been located. Such creation errors do not reflect on  the standard of medical care.

## 2020-06-11 ENCOUNTER — Ambulatory Visit (INDEPENDENT_AMBULATORY_CARE_PROVIDER_SITE_OTHER): Payer: Medicare HMO

## 2020-06-11 ENCOUNTER — Other Ambulatory Visit: Payer: Self-pay

## 2020-06-11 ENCOUNTER — Ambulatory Visit (INDEPENDENT_AMBULATORY_CARE_PROVIDER_SITE_OTHER): Payer: Medicare HMO | Admitting: Physician Assistant

## 2020-06-11 ENCOUNTER — Encounter: Payer: Self-pay | Admitting: Physician Assistant

## 2020-06-11 VITALS — BP 130/83 | HR 93 | Resp 12 | Ht 62.0 in | Wt 272.0 lb

## 2020-06-11 DIAGNOSIS — R252 Cramp and spasm: Secondary | ICD-10-CM

## 2020-06-11 DIAGNOSIS — M25552 Pain in left hip: Secondary | ICD-10-CM

## 2020-06-11 DIAGNOSIS — M47819 Spondylosis without myelopathy or radiculopathy, site unspecified: Secondary | ICD-10-CM

## 2020-06-11 DIAGNOSIS — M7701 Medial epicondylitis, right elbow: Secondary | ICD-10-CM

## 2020-06-11 DIAGNOSIS — Z79899 Other long term (current) drug therapy: Secondary | ICD-10-CM

## 2020-06-11 DIAGNOSIS — Z96653 Presence of artificial knee joint, bilateral: Secondary | ICD-10-CM | POA: Diagnosis not present

## 2020-06-11 DIAGNOSIS — M62838 Other muscle spasm: Secondary | ICD-10-CM | POA: Diagnosis not present

## 2020-06-11 DIAGNOSIS — Z8639 Personal history of other endocrine, nutritional and metabolic disease: Secondary | ICD-10-CM

## 2020-06-11 DIAGNOSIS — Z8679 Personal history of other diseases of the circulatory system: Secondary | ICD-10-CM

## 2020-06-11 DIAGNOSIS — M7062 Trochanteric bursitis, left hip: Secondary | ICD-10-CM

## 2020-06-11 DIAGNOSIS — Z8669 Personal history of other diseases of the nervous system and sense organs: Secondary | ICD-10-CM

## 2020-06-11 DIAGNOSIS — I5032 Chronic diastolic (congestive) heart failure: Secondary | ICD-10-CM

## 2020-06-11 DIAGNOSIS — Z8673 Personal history of transient ischemic attack (TIA), and cerebral infarction without residual deficits: Secondary | ICD-10-CM

## 2020-06-11 DIAGNOSIS — M5030 Other cervical disc degeneration, unspecified cervical region: Secondary | ICD-10-CM | POA: Diagnosis not present

## 2020-06-11 DIAGNOSIS — M7702 Medial epicondylitis, left elbow: Secondary | ICD-10-CM

## 2020-06-11 DIAGNOSIS — R911 Solitary pulmonary nodule: Secondary | ICD-10-CM

## 2020-06-11 NOTE — Patient Instructions (Addendum)
Standing Labs We placed an order today for your standing lab work.   Please have your standing labs drawn in December and every 3 months   If possible, please have your labs drawn 2 weeks prior to your appointment so that the provider can discuss your results at your appointment.  We have open lab daily Monday through Thursday from 8:30-12:30 PM and 1:30-4:30 PM and Friday from 8:30-12:30 PM and 1:30-4:00 PM at the office of Dr. Shaili Deveshwar, [facility].   Please be advised, patients with office appointments requiring lab work will take precedents over walk-in lab work.  If possible, please come for your lab work on Monday and Friday afternoons, as you may experience shorter wait times. The office is located at [address], [address], [place], [place] [address] No appointment is necessary.   Labs are drawn by [facility]. Please bring your co-pay at the time of your lab draw.  You may receive a bill from [facility] for your lab work.  If you wish to have your labs drawn at another location, please call the office 24 hours in advance to send orders.  If you have any questions regarding directions or hours of operation,  please call [phone].   As a reminder, please drink plenty of water prior to coming for your lab work. Thanks!   Hand Exercises Hand exercises can be helpful for almost anyone. These exercises can strengthen the hands, improve flexibility and movement, and increase blood flow to the hands. These results can make work and daily tasks easier. Hand exercises can be especially helpful for people who have joint pain from arthritis or have nerve damage from overuse (carpal tunnel syndrome). These exercises can also help people who have injured a hand. Exercises Most of these hand exercises are gentle stretching and motion exercises. It is usually safe to do them often throughout the day. Warming up your hands before exercise may help to reduce stiffness. You can do  this with gentle massage or by placing your hands in warm water for 10-15 minutes. It is normal to feel some stretching, pulling, tightness, or mild discomfort as you begin new exercises. This will gradually improve. Stop an exercise right away if you feel sudden, severe pain or your pain gets worse. Ask your health care provider which exercises are best for you. Knuckle bend or "claw" fist 1. Stand or sit with your arm, hand, and all five fingers pointed straight up. Make sure to keep your wrist straight during the exercise. 2. Gently bend your fingers down toward your palm until the tips of your fingers are touching the top of your palm. Keep your big knuckle straight and just bend the small knuckles in your fingers. 3. Hold this position for __________ seconds. 4. Straighten (extend) your fingers back to the starting position. Repeat this exercise 5-10 times with each hand. Full finger fist 1. Stand or sit with your arm, hand, and all five fingers pointed straight up. Make sure to keep your wrist straight during the exercise. 2. Gently bend your fingers into your palm until the tips of your fingers are touching the middle of your palm. 3. Hold this position for __________ seconds. 4. Extend your fingers back to the starting position, stretching every joint fully. Repeat this exercise 5-10 times with each hand. Straight fist 1. Stand or sit with your arm, hand, and all five fingers pointed straight up. Make sure to keep your wrist straight during the exercise. 2. Gently bend   your fingers at the big knuckle, where your fingers meet your hand, and the middle knuckle. Keep the knuckle at the tips of your fingers straight and try to touch the bottom of your palm. 3. Hold this position for __________ seconds. 4. Extend your fingers back to the starting position, stretching every joint fully. Repeat this exercise 5-10 times with each hand. Tabletop 1. Stand or sit with your arm, hand, and all five  fingers pointed straight up. Make sure to keep your wrist straight during the exercise. 2. Gently bend your fingers at the big knuckle, where your fingers meet your hand, as far down as you can while keeping the small knuckles in your fingers straight. Think of forming a tabletop with your fingers. 3. Hold this position for __________ seconds. 4. Extend your fingers back to the starting position, stretching every joint fully. Repeat this exercise 5-10 times with each hand. Finger spread 1. Place your hand flat on a table with your palm facing down. Make sure your wrist stays straight as you do this exercise. 2. Spread your fingers and thumb apart from each other as far as you can until you feel a gentle stretch. Hold this position for __________ seconds. 3. Bring your fingers and thumb tight together again. Hold this position for __________ seconds. Repeat this exercise 5-10 times with each hand. Making circles 1. Stand or sit with your arm, hand, and all five fingers pointed straight up. Make sure to keep your wrist straight during the exercise. 2. Make a circle by touching the tip of your thumb to the tip of your index finger. 3. Hold for __________ seconds. Then open your hand wide. 4. Repeat this motion with your thumb and each finger on your hand. Repeat this exercise 5-10 times with each hand. Thumb motion 1. Sit with your forearm resting on a table and your wrist straight. Your thumb should be facing up toward the ceiling. Keep your fingers relaxed as you move your thumb. 2. Lift your thumb up as high as you can toward the ceiling. Hold for __________ seconds. 3. Bend your thumb across your palm as far as you can, reaching the tip of your thumb for the small finger (pinkie) side of your palm. Hold for __________ seconds. Repeat this exercise 5-10 times with each hand. Grip strengthening  1. Hold a stress ball or other soft ball in the middle of your hand. 2. Slowly increase the  pressure, squeezing the ball as much as you can without causing pain. Think of bringing the tips of your fingers into the middle of your palm. All of your finger joints should bend when doing this exercise. 3. Hold your squeeze for __________ seconds, then relax. Repeat this exercise 5-10 times with each hand. Contact a health care provider if:  Your hand pain or discomfort gets much worse when you do an exercise.  Your hand pain or discomfort does not improve within 2 hours after you exercise. If you have any of these problems, stop doing these exercises right away. Do not do them again unless your health care provider says that you can. Get help right away if:  You develop sudden, severe hand pain or swelling. If this happens, stop doing these exercises right away. Do not do them again unless your health care provider says that you can. This information is not intended to replace advice given to you by your health care provider. Make sure you discuss any questions you have with your health   care provider. Document Revised: 12/20/2018 Document Reviewed: 08/30/2018 Elsevier Patient Education  2020 [employer]  

## 2020-06-12 ENCOUNTER — Ambulatory Visit: Payer: Medicare HMO | Admitting: Physician Assistant

## 2020-06-12 ENCOUNTER — Telehealth: Payer: Self-pay

## 2020-06-12 NOTE — Telephone Encounter (Signed)
Medications updated in chart.

## 2020-06-12 NOTE — Telephone Encounter (Signed)
Patient left a voicemail stating she reviewed her after visit summary and would like to make the following adjustments to her medication list.  Patient states Methocarbamol 750 mg tablet  - Dr. Ronnald Ramp has her taking medication 3 times/day.  Meloxicam 7.5 mg tablet twice a day.

## 2020-06-18 DIAGNOSIS — M542 Cervicalgia: Secondary | ICD-10-CM | POA: Diagnosis not present

## 2020-07-07 DIAGNOSIS — Z23 Encounter for immunization: Secondary | ICD-10-CM | POA: Diagnosis not present

## 2020-07-09 DIAGNOSIS — G43709 Chronic migraine without aura, not intractable, without status migrainosus: Secondary | ICD-10-CM | POA: Diagnosis not present

## 2020-07-15 ENCOUNTER — Other Ambulatory Visit: Payer: Self-pay | Admitting: *Deleted

## 2020-07-15 MED ORDER — COSENTYX SENSOREADY (300 MG) 150 MG/ML ~~LOC~~ SOAJ: 150.0000 mg | SUBCUTANEOUS | 0 refills | Status: DC

## 2020-07-15 NOTE — Telephone Encounter (Signed)
Refill request received via fax  Last Visit: 06/11/2020 Next Visit: 11/13/2020 Labs: 05/20/2020 CBC stable and CMP WNL TB Gold: 12/16/2019 Neg  Current Dose per office note 06/11/2020: Cosentyx 150 mg sq injections every 14 days  DX:  Spondyloarthropathy   Okay to refill per Dr. Estanislado Pandy

## 2020-07-21 DIAGNOSIS — R69 Illness, unspecified: Secondary | ICD-10-CM | POA: Diagnosis not present

## 2020-07-29 ENCOUNTER — Encounter: Payer: Self-pay | Admitting: Cardiovascular Disease

## 2020-07-29 ENCOUNTER — Other Ambulatory Visit: Payer: Self-pay

## 2020-07-29 ENCOUNTER — Ambulatory Visit: Payer: Medicare HMO | Admitting: Cardiovascular Disease

## 2020-07-29 VITALS — BP 142/86 | HR 89 | Ht 62.0 in | Wt 270.0 lb

## 2020-07-29 DIAGNOSIS — G4733 Obstructive sleep apnea (adult) (pediatric): Secondary | ICD-10-CM | POA: Diagnosis not present

## 2020-07-29 DIAGNOSIS — E7800 Pure hypercholesterolemia, unspecified: Secondary | ICD-10-CM | POA: Diagnosis not present

## 2020-07-29 DIAGNOSIS — I10 Essential (primary) hypertension: Secondary | ICD-10-CM | POA: Diagnosis not present

## 2020-07-29 DIAGNOSIS — I5032 Chronic diastolic (congestive) heart failure: Secondary | ICD-10-CM | POA: Diagnosis not present

## 2020-07-29 DIAGNOSIS — E669 Obesity, unspecified: Secondary | ICD-10-CM

## 2020-07-29 DIAGNOSIS — E1169 Type 2 diabetes mellitus with other specified complication: Secondary | ICD-10-CM | POA: Diagnosis not present

## 2020-07-29 NOTE — Patient Instructions (Signed)

## 2020-07-29 NOTE — Progress Notes (Signed)
Patient ID: [name], female   DOB: 10/30/1960, 59 y.o.   MRN: [id number]    Cardiology Office Note    Date:  07/29/2020   ID:  [name], DOB 04-03-1961, MRN [id number]  PCP:  Shon Baton, MD  Cardiologist:   Sanda Klein, MD   Chief Complaint  Patient presents with  . Congestive Heart Failure    History of Present Illness:  [name] is a 59 y.o. female with history of chronic diastolic CHF, morbid obesity, OSA, TIA, GERD, HTN, HLD, DM, asthma, anemia, former tobacco abuse, ankylosing spondylitis who presents for f/u.  Overall doing quite well.  Still has NYHA functional class II exertional dyspnea (has to stop at the top of each story when climbing stairs) and occasional mild ankle edema, but is generally well compensated on only 20 mg of furosemide daily.  Even though she is taking a relatively high dose of potassium supplementation as well as an ARB, her potassium was low within normal range on recent labs.  She denies dizziness, lightheadedness or syncope and has not had palpitations.  Denies orthopnea and PND.  She does not have chest pain either at rest or with exertion.  The denies claudication or focal neurological complaints.  She has no bilateral knee pain and back pain related to her spondyloarthropathy.  She takes meloxicam on a daily basis.  Diabetes is well controlled with a hemoglobin A1c that was 5.8% earlier this year.  Her most recent LDL cholesterol was within target range at 89.  She has normal renal function with a creatinine of 0.5.  She has been keeping a very detailed log of her blood pressure, which is consistently in the 120-130/70-80 range.  Her blood pressure is high when she goes to the doctor's office, including today.  Reports compliance with CPAP.  Denies daytime hypersomnolence.   Past Medical History:  Diagnosis Date  . Anemia    takes iron supplement  . Anxiety   . Asthma    daily and prn inhalers  . Bone  spur    Left foot  . Chest pain    a. 10/2015: NST showing possible mild ischemia but most consistent with breast attenuation. Low-risk study.   . Chronic diastolic CHF (congestive heart failure) ([place])   . Common migraine with intractable migraine 07/06/2018  . DDD (degenerative disc disease), cervical   . Degenerative disc disease   . Depression   . Diabetes mellitus ([place])   . Essential hypertension   . Family history of early CAD   . Former tobacco use   . GERD (gastroesophageal reflux disease)   . GERD (gastroesophageal reflux disease)   . Headache(784.0)    migraine-like  . History of TIA (transient ischemic attack) early 2013   no weakness or deficits  . History of UTI   . Hyperlipidemia   . Insomnia   . Migraines   . Morbid obesity ([place])   . Partial tear of rotator cuff(726.13)   . Psoriatic arthritis ([place])   . Shoulder impingement 04/2012   left  . Sleep apnea    uses CPAP nightly    Past Surgical History:  Procedure Laterality Date  . ABDOMINAL HYSTERECTOMY  03/28/2002   complete  . ANTERIOR CERVICAL DECOMP/DISCECTOMY FUSION  02/18/2010   C6-7  . BREAST BIOPSY Right   . DILATION AND CURETTAGE OF UTERUS     2000  . FOOT SURGERY Right 2015  . KNEE ARTHROPLASTY    . ROTATOR  CUFF REPAIR Left 2013  . TOTAL KNEE ARTHROPLASTY  09/07/2009   left  . TOTAL KNEE ARTHROPLASTY  10/13/2008   right  . TOTAL SHOULDER ARTHROPLASTY    . UMBILICAL HERNIA REPAIR  03/28/2002  . wisidom    . WRIST SURGERY  2020    Outpatient Medications Prior to Visit  Medication Sig Dispense Refill  . acidophilus (RISAQUAD) CAPS Take 1 capsule by mouth every morning.     Marland Kitchen ALPRAZolam (XANAX) 0.5 MG tablet Take 0.5 mg by mouth 2 (two) times daily as needed.    Marland Kitchen aspirin 81 MG tablet Take 81 mg by mouth daily.     Marland Kitchen atorvastatin (LIPITOR) 20 MG tablet Take 20 mg by mouth every evening.     . baclofen (LIORESAL) 10 MG tablet Take 10-20 mg by mouth See admin instructions. Weekly as needed    .  chlorproMAZINE (THORAZINE) 25 MG tablet Take 25-50 mg by mouth See admin instructions. every 3 hours as needed max twice month  1  . diclofenac sodium (VOLTAREN) 1 % GEL Apply three grams to three large joints up to three times daily 3 Tube 3  . DULoxetine (CYMBALTA) 60 MG capsule Take 60 mg by mouth daily.   3  . ferrous sulfate 325 (65 FE) MG tablet Take 325 mg by mouth 2 (two) times a week.    . furosemide (LASIX) 40 MG tablet TAKE 1 TABLET BY MOUTH DAILY 90 tablet 3  . HYDROcodone-acetaminophen (NORCO/VICODIN) 5-325 MG per tablet Take 1 tablet by mouth as needed for moderate pain.     . hydrOXYzine (ATARAX/VISTARIL) 25 MG tablet Take 25 mg by mouth as directed.    . irbesartan (AVAPRO) 75 MG tablet Take 37.5 mg by mouth. Take only if blood pressure above 130    . loratadine (CLARITIN) 10 MG tablet Take 10 mg by mouth daily.     . meloxicam (MOBIC) 15 MG tablet Take 15 mg by mouth in the morning, at noon, and at bedtime.   3  . metFORMIN (GLUCOPHAGE) 500 MG tablet Take 500 mg by mouth daily with breakfast.     . methocarbamol (ROBAXIN) 750 MG tablet Take 750 mg by mouth 3 (three) times daily.     . mometasone (NASONEX) 50 MCG/ACT nasal spray Place 2 sprays into the nose as needed.    . Multiple Vitamin (MULITIVITAMIN WITH MINERALS) TABS Take 1 tablet by mouth daily.    . mupirocin nasal ointment (BACTROBAN) 2 % Place 1 application into the nose 2 (two) times daily. Use one-half of tube in each . After application, press sides of nose together and gently massage.    . NON FORMULARY at bedtime. CPAP    . Olopatadine HCl (PATADAY OP) Place 1 drop into both eyes as needed.     . ONE TOUCH ULTRA TEST test strip     . ONETOUCH DELICA LANCETS 95A MISC     . pantoprazole (PROTONIX) 40 MG tablet Take 40 mg by mouth daily.    . potassium chloride SA (K-DUR,KLOR-CON) 20 MEQ tablet Take 40 mEq by mouth 2 (two) times daily.   3  . Secukinumab, 300 MG Dose, (COSENTYX SENSOREADY, 300 MG,) 150 MG/ML SOAJ  Inject 150 mg into the skin every 14 (fourteen) days. 6 mL 0  . topiramate (TOPAMAX) 100 MG tablet Take 300 mg by mouth at bedtime.    . vitamin B-12 (CYANOCOBALAMIN) 500 MCG tablet Take 500 mcg by mouth daily as needed.  No facility-administered medications prior to visit.     Allergies:   Patient has no known allergies.   Social History   Socioeconomic History  . Marital status: Married    Spouse name: Jenny Reichmann  . Number of children: 5  . Years of education: some college  . Highest education level: Not on file  Occupational History  . Occupation: Disabled  Tobacco Use  . Smoking status: Former Smoker    Packs/day: 0.50    Years: 20.00    Pack years: 10.00    Types: Cigarettes    Quit date: 10/02/1999    Years since quitting: 20.8  . Smokeless tobacco: Never Used  Vaping Use  . Vaping Use: Never used  Substance and Sexual Activity  . Alcohol use: No  . Drug use: No  . Sexual activity: Yes    Birth control/protection: None  Other Topics Concern  . Not on file  Social History Narrative   Lives with husband, Jenny Reichmann   Caffeine use: 1 cup coffee every morning   Right handed    Social Determinants of Health   Financial Resource Strain:   . Difficulty of Paying Living Expenses: Not on file  Food Insecurity:   . Worried About Charity fundraiser in the Last Year: Not on file  . Ran Out of Food in the Last Year: Not on file  Transportation Needs:   . Lack of Transportation (Medical): Not on file  . Lack of Transportation (Non-Medical): Not on file  Physical Activity:   . Days of Exercise per Week: Not on file  . Minutes of Exercise per Session: Not on file  Stress:   . Feeling of Stress : Not on file  Social Connections:   . Frequency of Communication with Friends and Family: Not on file  . Frequency of Social Gatherings with Friends and Family: Not on file  . Attends Religious Services: Not on file  . Active Member of Clubs or Organizations: Not on file  . Attends  Archivist Meetings: Not on file  . Marital Status: Not on file     Family History:  The patient's family history includes Breast cancer in her sister and sister; Cancer in her maternal grandmother and paternal grandfather; Coronary artery disease in her sister; Crohn's disease in her cousin, cousin, maternal aunt, and paternal aunt; Diabetes in her father, maternal grandmother, mother, paternal grandfather, sister, and sister; Hypertension in her father, maternal grandmother, and mother; Kidney disease in her father.   ROS:   Please see the history of present illness.    ROS All other systems are reviewed and are negative.   PHYSICAL EXAM:   VS:  BP (!) 142/86   Pulse 89   Ht 5\' 2"  (1.575 m)   Wt 270 lb (122.5 kg)   SpO2 98%   BMI 49.38 kg/m      General: Alert, oriented x3, no distress, morbidly obese Head: no evidence of trauma, PERRL, EOMI, no exophtalmos or lid lag, no myxedema, no xanthelasma; normal ears, nose and oropharynx Neck: normal jugular venous pulsations and no hepatojugular reflux; brisk carotid pulses without delay and no carotid bruits Chest: clear to auscultation, no signs of consolidation by percussion or palpation, normal fremitus, symmetrical and full respiratory excursions Cardiovascular: normal position and quality of the apical impulse, regular rhythm, normal first and second heart sounds, no murmurs, rubs or gallops Abdomen: no tenderness or distention, no masses by palpation, no abnormal pulsatility or arterial bruits, normal  bowel sounds, no hepatosplenomegaly Extremities: no clubbing, cyanosis; symmetrical trace ankle edema; 2+ radial, ulnar and brachial pulses bilaterally; 2+ right femoral, posterior tibial and dorsalis pedis pulses; 2+ left femoral, posterior tibial and dorsalis pedis pulses; no subclavian or femoral bruits Neurological: grossly nonfocal Psych: Normal mood and affect   Wt Readings from Last 3 Encounters:  07/29/20 270 lb  (122.5 kg)  06/11/20 272 lb (123.4 kg)  01/10/20 277 lb (125.6 kg)      Studies/Labs Reviewed:   EKG:  EKG was not ordered in the office today.   Recent Labs: 05/20/2020: ALT 11; BUN 12; Creat 0.95; Hemoglobin 12.1; Platelets 239; Potassium 4.7; Sodium 139   Lipid Panel    Component Value Date/Time   CHOL 143 03/12/2013 0200   TRIG 128 03/12/2013 0200   HDL 42 03/12/2013 0200   CHOLHDL 3.4 03/12/2013 0200   VLDL 26 03/12/2013 0200   LDLCALC 75 03/12/2013 0200   10/01/2018 Total cholesterol 159, HDL 50, LDL 92, triglycerides 87  ASSESSMENT:    1. Chronic diastolic heart failure ([place])   2. Essential hypertension   3. Morbid obesity ([place])   4. OSA (obstructive sleep apnea)   5. Hypercholesterolemia   6. Diabetes mellitus type 2 in obese [facility])      PLAN:  In order of problems listed above:  1. CHF: NYHA functional class I-II.  No signs of hypervolemia.  On a very low-dose of diuretic. 2. HTN: Excellent control, consistently less than 130/80. 3. Obesity: Continues to slowly lose a little bit of weight. 4. OSA: Compliant with CPAP. 5. HLP: No known coronary vascular stenoses.  Target LDL less than 100.  On statin 6. DM: Excellent control on Metformin monotherapy.    Medication Adjustments/Labs and Tests Ordered: Current medicines are reviewed at length with the patient today.  Concerns regarding medicines are outlined above.  Medication changes, Labs and Tests ordered today are listed in the Patient Instructions below. Patient Instructions  Medication Instructions:  No changes *If you need a refill on your cardiac medications before your next appointment, please call your pharmacy*   Lab Work: None ordered If you have labs (blood work) drawn today and your tests are completely normal, you will receive your results only by: Marland Kitchen MyChart Message (if you have MyChart) OR . A paper copy in the mail If you have any lab test that is abnormal or we need to change your  treatment, we will call you to review the results.   Testing/Procedures: None ordered   Follow-Up: At [facility], you and your health needs are our priority.  As part of our continuing mission to provide you with exceptional heart care, we have created designated Provider Care Teams.  These Care Teams include your primary Cardiologist (physician) and Advanced Practice Providers (APPs -  Physician Assistants and Nurse Practitioners) who all work together to provide you with the care you need, when you need it.  We recommend signing up for the patient portal called "MyChart".  Sign up information is provided on this After Visit Summary.  MyChart is used to connect with patients for Virtual Visits (Telemedicine).  Patients are able to view lab/test results, encounter notes, upcoming appointments, etc.  Non-urgent messages can be sent to your provider as well.   To learn more about what you can do with MyChart, go to [email or web address].    Your next appointment:   12 month(s)  The format for your next appointment:   In Person  Provider:  You may see Sanda Klein, MD or one of the following Advanced Practice Providers on your designated Care Team:    Almyra Deforest, PA-C  Fabian Sharp, [place] or   Roby Lofts, PA-C          Signed, Sanda Klein, MD  07/29/2020 11:15 AM    [facility] [address], [place], [place]  [address] Phone: [phone]; Fax: [phone]

## 2020-08-03 ENCOUNTER — Telehealth: Payer: Self-pay | Admitting: Pharmacist

## 2020-08-03 NOTE — Telephone Encounter (Addendum)
Received renewal application from [employer] for [employer] patient assistance. Completed office portion and spoke with patient about required information. I've faxed provider portion to [employer].  Ms. Cornia plans to stop by next week to pick up paperwork and complete her portion. She has labs due the beginning of December.  Initiated prior authorization renewal over the phone. Awaiting decision from insurance. Will have to fax denial to [employer] and PA #.  Knox Saliva, PharmD, MPH Clinical Pharmacist (Rheumatology and Pulmonology)

## 2020-08-04 NOTE — Telephone Encounter (Signed)
Received notification from [facility] regarding a prior authorization for [place]. Authorization has been APPROVED from 09/12/20 to 09/11/21.   Will fax PA approval to [employer] and send copy to scan center.  Authorization # [id number] Phone # [phone]  Knox Saliva, PharmD, MPH Clinical Pharmacist (Rheumatology and Pulmonology)

## 2020-08-10 ENCOUNTER — Other Ambulatory Visit: Payer: Self-pay | Admitting: *Deleted

## 2020-08-10 DIAGNOSIS — Z79899 Other long term (current) drug therapy: Secondary | ICD-10-CM

## 2020-08-10 NOTE — Telephone Encounter (Signed)
Submitted Patient Assistance Application to [employer] for [employer] along with provider portion, PA and income documents for RENEWAL via fax. Will update patient when we receive a response.  Fax# [phone] Phone# [phone]  Knox Saliva, PharmD, MPH Clinical Pharmacist (Rheumatology and Pulmonology)

## 2020-08-11 DIAGNOSIS — J383 Other diseases of vocal cords: Secondary | ICD-10-CM | POA: Diagnosis not present

## 2020-08-11 DIAGNOSIS — H1045 Other chronic allergic conjunctivitis: Secondary | ICD-10-CM | POA: Diagnosis not present

## 2020-08-11 DIAGNOSIS — J310 Chronic rhinitis: Secondary | ICD-10-CM | POA: Diagnosis not present

## 2020-08-11 DIAGNOSIS — R062 Wheezing: Secondary | ICD-10-CM | POA: Diagnosis not present

## 2020-08-11 LAB — COMPLETE METABOLIC PANEL WITH GFR
AG Ratio: 1.4 (calc) (ref 1.0–2.5)
ALT: 15 U/L (ref 6–29)
AST: 14 U/L (ref 10–35)
Albumin: 4.3 g/dL (ref 3.6–5.1)
Alkaline phosphatase (APISO): 103 U/L (ref 37–153)
BUN: 14 mg/dL (ref 7–25)
CO2: 24 mmol/L (ref 20–32)
Calcium: 9.7 mg/dL (ref 8.6–10.4)
Chloride: 108 mmol/L (ref 98–110)
Creat: 0.77 mg/dL (ref 0.50–1.05)
GFR, Est African American: 98 mL/min/{1.73_m2} (ref >=60)
GFR, Est Non African American: 85 mL/min/{1.73_m2} (ref >=60)
Globulin: 3.1 g/dL (calc) (ref 1.9–3.7)
Glucose, Bld: 79 mg/dL (ref 65–99)
Potassium: 4.8 mmol/L (ref 3.5–5.3)
Sodium: 139 mmol/L (ref 135–146)
Total Bilirubin: 0.4 mg/dL (ref 0.2–1.2)
Total Protein: 7.4 g/dL (ref 6.1–8.1)

## 2020-08-11 LAB — CBC WITH DIFFERENTIAL/PLATELET
Absolute Monocytes: 544 cells/uL (ref 200–950)
Basophils Absolute: 32 cells/uL (ref 0–200)
Basophils Relative: 0.4 %
Eosinophils Absolute: 248 cells/uL (ref 15–500)
Eosinophils Relative: 3.1 %
HCT: 39.8 % (ref 35.0–45.0)
Hemoglobin: 12.6 g/dL (ref 11.7–15.5)
Lymphs Abs: 2152 cells/uL (ref 850–3900)
MCH: 26.4 pg — ABNORMAL LOW (ref 27.0–33.0)
MCHC: 31.7 g/dL — ABNORMAL LOW (ref 32.0–36.0)
MCV: 83.3 fL (ref 80.0–100.0)
MPV: 10 fL (ref 7.5–12.5)
Monocytes Relative: 6.8 %
Neutro Abs: 5024 cells/uL (ref 1500–7800)
Neutrophils Relative %: 62.8 %
Platelets: 258 10*3/uL (ref 140–400)
RBC: 4.78 10*6/uL (ref 3.80–5.10)
RDW: 14.6 % (ref 11.0–15.0)
Total Lymphocyte: 26.9 %
WBC: 8 10*3/uL (ref 3.8–10.8)

## 2020-08-12 NOTE — Telephone Encounter (Signed)
Received fax from [employer] regarding missing patient signature, insurance, and income information. Refaxed complete application to [employer].  Fax # [phone] Phone # [phone]  Knox Saliva, PharmD, MPH Clinical Pharmacist (Rheumatology and Pulmonology)

## 2020-09-08 DIAGNOSIS — G43709 Chronic migraine without aura, not intractable, without status migrainosus: Secondary | ICD-10-CM | POA: Diagnosis not present

## 2020-09-08 DIAGNOSIS — M791 Myalgia, unspecified site: Secondary | ICD-10-CM | POA: Diagnosis not present

## 2020-09-08 DIAGNOSIS — G518 Other disorders of facial nerve: Secondary | ICD-10-CM | POA: Diagnosis not present

## 2020-09-08 DIAGNOSIS — M542 Cervicalgia: Secondary | ICD-10-CM | POA: Diagnosis not present

## 2020-09-17 NOTE — Telephone Encounter (Signed)
Received a fax from [employer] regarding an approval for [employer] patient assistance through 09/11/21.   Phone number: [phone]  Will send approval letter to scan center.  Chesley Mires, PharmD, MPH Clinical Pharmacist (Rheumatology and Pulmonology)

## 2020-10-06 DIAGNOSIS — Z20822 Contact with and (suspected) exposure to covid-19: Secondary | ICD-10-CM | POA: Diagnosis not present

## 2020-10-08 DIAGNOSIS — I129 Hypertensive chronic kidney disease with stage 1 through stage 4 chronic kidney disease, or unspecified chronic kidney disease: Secondary | ICD-10-CM | POA: Diagnosis not present

## 2020-10-08 DIAGNOSIS — Z0000 Encounter for general adult medical examination without abnormal findings: Secondary | ICD-10-CM | POA: Diagnosis not present

## 2020-10-08 DIAGNOSIS — E785 Hyperlipidemia, unspecified: Secondary | ICD-10-CM | POA: Diagnosis not present

## 2020-10-08 DIAGNOSIS — E119 Type 2 diabetes mellitus without complications: Secondary | ICD-10-CM | POA: Diagnosis not present

## 2020-10-14 DIAGNOSIS — R82998 Other abnormal findings in urine: Secondary | ICD-10-CM | POA: Diagnosis not present

## 2020-10-14 DIAGNOSIS — I129 Hypertensive chronic kidney disease with stage 1 through stage 4 chronic kidney disease, or unspecified chronic kidney disease: Secondary | ICD-10-CM | POA: Diagnosis not present

## 2020-10-14 DIAGNOSIS — N182 Chronic kidney disease, stage 2 (mild): Secondary | ICD-10-CM | POA: Diagnosis not present

## 2020-10-15 DIAGNOSIS — G4733 Obstructive sleep apnea (adult) (pediatric): Secondary | ICD-10-CM | POA: Diagnosis not present

## 2020-10-15 DIAGNOSIS — Z1589 Genetic susceptibility to other disease: Secondary | ICD-10-CM | POA: Diagnosis not present

## 2020-10-15 DIAGNOSIS — I5189 Other ill-defined heart diseases: Secondary | ICD-10-CM | POA: Diagnosis not present

## 2020-10-15 DIAGNOSIS — E119 Type 2 diabetes mellitus without complications: Secondary | ICD-10-CM | POA: Diagnosis not present

## 2020-10-15 DIAGNOSIS — I129 Hypertensive chronic kidney disease with stage 1 through stage 4 chronic kidney disease, or unspecified chronic kidney disease: Secondary | ICD-10-CM | POA: Diagnosis not present

## 2020-10-15 DIAGNOSIS — M461 Sacroiliitis, not elsewhere classified: Secondary | ICD-10-CM | POA: Diagnosis not present

## 2020-10-15 DIAGNOSIS — D8989 Other specified disorders involving the immune mechanism, not elsewhere classified: Secondary | ICD-10-CM | POA: Diagnosis not present

## 2020-10-15 DIAGNOSIS — N182 Chronic kidney disease, stage 2 (mild): Secondary | ICD-10-CM | POA: Diagnosis not present

## 2020-10-15 DIAGNOSIS — Z0000 Encounter for general adult medical examination without abnormal findings: Secondary | ICD-10-CM | POA: Diagnosis not present

## 2020-10-30 NOTE — Progress Notes (Signed)
 Office Visit Note  Patient: [name]             Date of Birth: 05/31/1961           MRN: [id number]             PCP: Russo, John, MD Referring: Russo, John, MD Visit Date: 11/13/2020 Occupation: @GUAROCC@  Subjective:  Medication management.   History of Present Illness: [name] is a 59 y.o. female with a history of spondyloarthopathy and osteoarthritis. She is currently taking Cosentyx 300 mg/mL every 4 days. She reports she has some pain a few days before her injection is due. She has had some pain in her SI joints, knees, hips and shoulders. Standing for long periods of time also aggravates her SI joint pain. She will take hydrocodone every other day for the pain. Her prescription is managed by her PCP Dr. Russo. She denies any joint swelling. She is waking up at night with pain and aching in her legs and knees. She uses volatern gel and absorbine jr as needed. She reports this has brought her pain relief. She has bilateral knee replacements several years ago. She has some stiffness and pain, especially using stairs. She takes meloxicam 15 mg for this pain. She has noticed decreased grip strength especially when opening jars. She has not had any new rashes, no sores, enlarged lymph, iritis or photosensitivity. No issues with plantar fascitis or achilles tendinitis. She has received 3 doses of COVID-19 vaccination.    Activities of Daily Living:  Patient reports morning stiffness for 15-20 minutes.   Patient Reports nocturnal pain.  Difficulty dressing/grooming: Denies Difficulty climbing stairs: Reports Difficulty getting out of chair: Reports Difficulty using hands for taps, buttons, cutlery, and/or writing: Reports  Review of Systems  Constitutional: Positive for fatigue.  HENT: Positive for mouth dryness and nose dryness. Negative for mouth sores.   Eyes: Positive for dryness. Negative for pain, itching and visual disturbance.  Respiratory: Positive  for shortness of breath. Negative for cough, hemoptysis and difficulty breathing.   Cardiovascular: Positive for chest pain. Negative for palpitations and swelling in legs/feet.  Gastrointestinal: Positive for constipation and diarrhea. Negative for abdominal pain and blood in stool.  Endocrine: Negative for increased urination.  Genitourinary: Negative for painful urination.  Musculoskeletal: Positive for arthralgias, joint pain, joint swelling, myalgias, morning stiffness and myalgias. Negative for muscle weakness and muscle tenderness.  Skin: Negative for color change, rash and redness.  Allergic/Immunologic: Negative for susceptible to infections.  Neurological: Positive for headaches and weakness. Negative for dizziness, numbness and memory loss.  Hematological: Negative for swollen glands.  Psychiatric/Behavioral: Positive for sleep disturbance. Negative for confusion.    PMFS History:  Patient Active Problem List   Diagnosis Date Noted  . Spondyloarthropathy 07/26/2016    Priority: High  . High risk medication use 07/26/2016    Priority: High  . Chronic low back pain 07/26/2016    Priority: Medium  . H/O total knee replacement, bilateral 07/26/2016    Priority: Medium  . DJD (degenerative joint disease), cervical     Priority: Medium  . Chronic diastolic heart failure (HCC) 11/29/2015    Priority: Low  . Lung nodule seen on imaging study, CTA of chest, 6 mm nodule, followed by Dr. Russo 03/25/2013    Priority: Low  . Super obese 03/12/2013    Priority: Low  . Hypercholesterolemia     Priority: Low  . OSA (obstructive sleep apnea)       Priority: Low  . Essential hypertension     Priority: Low  . History of TIA (transient ischemic attack) 9/13     Priority: Low  . De Quervain's disease (radial styloid tenosynovitis) 11/27/2018  . Vertigo 10/18/2018  . Common migraine with intractable migraine 07/06/2018  . Diabetes mellitus type 2 in obese (HCC) 06/29/2018  . Dyspnea  06/03/2015  . Family history of coronary artery disease 03/12/2013  . Chest pain with low risk of acute coronary syndrome - most likely musculoskeletal, negative nuc study 03/11/2013  . Full thickness rotator cuff tear 05/15/2012  . Partial tear of rotator cuff(726.13)   . Shoulder impingement 04/12/2012    Past Medical History:  Diagnosis Date  . Anemia    takes iron supplement  . Anxiety   . Asthma    daily and prn inhalers  . Bone spur    Left foot  . Chest pain    a. 10/2015: NST showing possible mild ischemia but most consistent with breast attenuation. Low-risk study.   . Chronic diastolic CHF (congestive heart failure) (HCC)   . Common migraine with intractable migraine 07/06/2018  . DDD (degenerative disc disease), cervical   . Degenerative disc disease   . Depression   . Diabetes mellitus (HCC)   . Essential hypertension   . Family history of early CAD   . Former tobacco use   . GERD (gastroesophageal reflux disease)   . GERD (gastroesophageal reflux disease)   . Headache(784.0)    migraine-like  . History of TIA (transient ischemic attack) early 2013   no weakness or deficits  . History of UTI   . Hyperlipidemia   . Insomnia   . Migraines   . Morbid obesity (HCC)   . Partial tear of rotator cuff(726.13)   . Psoriatic arthritis (HCC)   . Shoulder impingement 04/2012   left  . Sleep apnea    uses CPAP nightly    Family History  Problem Relation Age of Onset  . Diabetes Mother   . Hypertension Mother   . Kidney disease Father   . Hypertension Father   . Diabetes Father   . Breast cancer Sister   . Diabetes Sister   . Breast cancer Sister   . Cancer Maternal Grandmother   . Diabetes Maternal Grandmother   . Hypertension Maternal Grandmother   . Diabetes Paternal Grandfather   . Cancer Paternal Grandfather   . Coronary artery disease Sister   . Diabetes Sister   . Crohn's disease Maternal Aunt   . Crohn's disease Cousin   . Crohn's disease Cousin    . Crohn's disease Paternal Aunt    Past Surgical History:  Procedure Laterality Date  . ABDOMINAL HYSTERECTOMY  03/28/2002   complete  . ANTERIOR CERVICAL DECOMP/DISCECTOMY FUSION  02/18/2010   C6-7  . BREAST BIOPSY Right   . DILATION AND CURETTAGE OF UTERUS     2000  . FOOT SURGERY Right 2015  . KNEE ARTHROPLASTY    . ROTATOR CUFF REPAIR Left 2013  . TOTAL KNEE ARTHROPLASTY  09/07/2009   left  . TOTAL KNEE ARTHROPLASTY  10/13/2008   right  . TOTAL SHOULDER ARTHROPLASTY    . UMBILICAL HERNIA REPAIR  03/28/2002  . [facility]idom    . WRIST SURGERY  2020   Social History   Social History Narrative   Lives with husband, John   Caffeine use: 1 cup coffee every morning   Right handed    Immunization History  Administered Date(s)   Administered  . Influenza-Unspecified 06/05/2018  . PFIZER(Purple Top)SARS-COV-2 Vaccination 11/28/2019, 12/18/2019, 06/02/2020     Objective: Vital Signs: BP 135/75 (BP Location: Left Arm, Patient Position: Sitting, Cuff Size: Normal)   Pulse 82   Ht 5' 2" (1.575 m)   Wt 272 lb 9.6 oz (123.7 kg)   BMI 49.86 kg/m    Physical Exam Vitals and nursing note reviewed.  Constitutional:      Appearance: She is well-developed and well-nourished.  HENT:     Head: Normocephalic and atraumatic.  Eyes:     Extraocular Movements: EOM normal.     Conjunctiva/sclera: Conjunctivae normal.  Cardiovascular:     Rate and Rhythm: Normal rate and regular rhythm.     Pulses: Intact distal pulses.     Heart sounds: Normal heart sounds.  Pulmonary:     Effort: Pulmonary effort is normal.     Breath sounds: Normal breath sounds.  Abdominal:     General: Bowel sounds are normal.     Palpations: Abdomen is soft.  Musculoskeletal:     Cervical back: Normal range of motion.  Lymphadenopathy:     Cervical: No cervical adenopathy.  Skin:    General: Skin is warm and dry.     Capillary Refill: Capillary refill takes less than 2 seconds.  Neurological:     Mental  Status: She is alert and oriented to person, place, and time.  Psychiatric:        Mood and Affect: Mood and affect normal.        Behavior: Behavior normal.      Musculoskeletal Exam: C-spine was in good range of motion.  She has discomfort range of motion of lumbar spine.  She had no tenderness over SI joints.  Shoulder joints with good range of motion.  She describes some discomfort over the anterior aspect of her right shoulder where she had rotator cuffs surgery in the past.  Elbow joints, wrist joints, MCPs PIPs and DIPs with good range of motion.  She has good range of motion of her hip joints with some discomfort in the left hip.  Bilateral knee joints are replaced.  She had no tenderness over ankles or MTPs.  CDAI Exam: CDAI Score: -- Patient Global: --; Provider Global: -- Swollen: --; Tender: -- Joint Exam 11/13/2020   No joint exam has been documented for this visit   There is currently no information documented on the homunculus. Go to the Rheumatology activity and complete the homunculus joint exam.  Investigation: No additional findings.  Imaging: No results found.  Recent Labs: Lab Results  Component Value Date   WBC 8.0 11/02/2020   HGB 12.6 11/02/2020   PLT 233 11/02/2020   NA 142 11/02/2020   K 4.9 11/02/2020   CL 111 (H) 11/02/2020   CO2 24 11/02/2020   GLUCOSE 84 11/02/2020   BUN 10 11/02/2020   CREATININE 0.82 11/02/2020   BILITOT 0.3 11/02/2020   ALKPHOS 119 06/27/2018   AST 14 11/02/2020   ALT 12 11/02/2020   PROT 6.9 11/02/2020   ALBUMIN 3.6 06/27/2018   CALCIUM 9.8 11/02/2020   GFRAA 91 11/02/2020   QFTBGOLDPLUS NEGATIVE 12/16/2019    Speciality Comments: Patient recieves Cosentyx through [employer] 04/29/2019  Procedures:  No procedures performed Allergies: Patient has no known allergies.   Assessment / Plan:     Visit Diagnoses: Spondyloarthropathy - MRI positive for sacroiliitis, HLA-B27 negative,  elevated ESR: She is doing much better since she has   been on Cosentyx.  She continues to have some discomfort in the SI joints.  There is no history of Achilles tendinitis, plantar fasciitis or iritis.  High risk medication use - Cosentyx 150 mg sq injections every 14 days. - Plan: QuantiFERON-TB Gold Plus.  Her labs have been stable.  We will check labs again in May and then every 3 months to monitor for drug toxicity.  Have advised her to get TB Gold with her next labs.  Medial epicondylitis of both elbows - Resolved.   DDD (degenerative disc disease), cervical-she had fairly good range of motion with some stiffness.  Left shoulder pain-she has history of rotator cuff tear.  She continues to have some discomfort in her left shoulder which increased recently.  I offered physical therapy which she declined.  Have given her a handout on shoulder joint exercises.  Trapezius muscle spasm-she had bilateral trapezius spasm.  Muscle cramping-muscle cramps continues to be an issue for her.  Trochanteric bursitis, left hip-she has some discomfort in the left trochanteric bursa for which she is stretching exercises were discussed.  H/O total knee replacement, bilateral - According to the patient she had her knees replaced 10 to 11 years ago by Dr. Wainer.  Patient continues to have chronic pain.  Lung nodule seen on imaging study, CTA of chest, 6 mm nodule, followed by Dr. Russo  History of hyperlipidemia-increase association of heart disease in patients with autoimmune disease was discussed.  Weight loss diet and exercise was emphasized.  I have given her a handout on dietary modifications and exercise.  History of diabetes mellitus  History of sleep apnea  History of hypertension -her blood pressure is normal.  History of TIA (transient ischemic attack)  Chronic diastolic heart failure (HCC)  Educated about COVID-19 virus infection-patient has been fully vaccinated against COVID-19 and had 3  doses of the vaccination.  She has been advised to get a fourth vaccine 6 months after the third vaccine.  Use of mask, social distancing and hand hygiene was advised.  She was also advised to get Shingrix vaccine.  Orders: Orders Placed This Encounter  Procedures  . QuantiFERON-TB Gold Plus   No orders of the defined types were placed in this encounter.    Follow-Up Instructions: Return in about 5 months (around 04/15/2021) for Spondyloarthropathy.   Shaili Deveshwar, MD  Note - This record has been created using Dragon software.  Chart creation errors have been sought, but may not always  have been located. Such creation errors do not reflect on  the standard of medical care. 

## 2020-11-02 ENCOUNTER — Other Ambulatory Visit: Payer: Self-pay

## 2020-11-02 DIAGNOSIS — Z79899 Other long term (current) drug therapy: Secondary | ICD-10-CM

## 2020-11-03 LAB — CBC WITH DIFFERENTIAL/PLATELET
Absolute Monocytes: 592 cells/uL (ref 200–950)
Basophils Absolute: 32 cells/uL (ref 0–200)
Basophils Relative: 0.4 %
Eosinophils Absolute: 272 cells/uL (ref 15–500)
Eosinophils Relative: 3.4 %
HCT: 38.5 % (ref 35.0–45.0)
Hemoglobin: 12.6 g/dL (ref 11.7–15.5)
Lymphs Abs: 1864 cells/uL (ref 850–3900)
MCH: 27.7 pg (ref 27.0–33.0)
MCHC: 32.7 g/dL (ref 32.0–36.0)
MCV: 84.6 fL (ref 80.0–100.0)
MPV: 9.7 fL (ref 7.5–12.5)
Monocytes Relative: 7.4 %
Neutro Abs: 5240 cells/uL (ref 1500–7800)
Neutrophils Relative %: 65.5 %
Platelets: 233 10*3/uL (ref 140–400)
RBC: 4.55 10*6/uL (ref 3.80–5.10)
RDW: 13.8 % (ref 11.0–15.0)
Total Lymphocyte: 23.3 %
WBC: 8 10*3/uL (ref 3.8–10.8)

## 2020-11-03 LAB — COMPLETE METABOLIC PANEL WITH GFR
AG Ratio: 1.5 (calc) (ref 1.0–2.5)
ALT: 12 U/L (ref 6–29)
AST: 14 U/L (ref 10–35)
Albumin: 4.1 g/dL (ref 3.6–5.1)
Alkaline phosphatase (APISO): 108 U/L (ref 37–153)
BUN: 10 mg/dL (ref 7–25)
CO2: 24 mmol/L (ref 20–32)
Calcium: 9.8 mg/dL (ref 8.6–10.4)
Chloride: 111 mmol/L — ABNORMAL HIGH (ref 98–110)
Creat: 0.82 mg/dL (ref 0.50–1.05)
GFR, Est African American: 91 mL/min/{1.73_m2} (ref >=60)
GFR, Est Non African American: 78 mL/min/{1.73_m2} (ref >=60)
Globulin: 2.8 g/dL (calc) (ref 1.9–3.7)
Glucose, Bld: 84 mg/dL (ref 65–99)
Potassium: 4.9 mmol/L (ref 3.5–5.3)
Sodium: 142 mmol/L (ref 135–146)
Total Bilirubin: 0.3 mg/dL (ref 0.2–1.2)
Total Protein: 6.9 g/dL (ref 6.1–8.1)

## 2020-11-06 ENCOUNTER — Telehealth: Payer: Medicare HMO | Admitting: Cardiovascular Disease

## 2020-11-12 ENCOUNTER — Other Ambulatory Visit: Payer: Self-pay

## 2020-11-12 ENCOUNTER — Ambulatory Visit: Payer: Medicare HMO | Admitting: Cardiovascular Disease

## 2020-11-12 ENCOUNTER — Encounter: Payer: Self-pay | Admitting: Cardiovascular Disease

## 2020-11-12 DIAGNOSIS — I5032 Chronic diastolic (congestive) heart failure: Secondary | ICD-10-CM

## 2020-11-12 DIAGNOSIS — E669 Obesity, unspecified: Secondary | ICD-10-CM

## 2020-11-12 DIAGNOSIS — E1169 Type 2 diabetes mellitus with other specified complication: Secondary | ICD-10-CM

## 2020-11-12 DIAGNOSIS — I10 Essential (primary) hypertension: Secondary | ICD-10-CM | POA: Diagnosis not present

## 2020-11-12 DIAGNOSIS — G4733 Obstructive sleep apnea (adult) (pediatric): Secondary | ICD-10-CM

## 2020-11-12 NOTE — Patient Instructions (Signed)
Medication Instructions:  The current medical regimen is effective;  continue present plan and medications.  *If you need a refill on your cardiac medications before your next appointment, please call your pharmacy*   Follow-Up: At [facility], you and your health needs are our priority.  As part of our continuing mission to provide you with exceptional heart care, we have created designated Provider Care Teams.  These Care Teams include your primary Cardiologist (physician) and Advanced Practice Providers (APPs -  Physician Assistants and Nurse Practitioners) who all work together to provide you with the care you need, when you need it.  We recommend signing up for the patient portal called "MyChart".  Sign up information is provided on this After Visit Summary.  MyChart is used to connect with patients for Virtual Visits (Telemedicine).  Patients are able to view lab/test results, encounter notes, upcoming appointments, etc.  Non-urgent messages can be sent to your provider as well.   To learn more about what you can do with MyChart, go to [email or web address].    Your next appointment:   3 month(s)  The format for your next appointment:   In Person  Provider:   Shelva Majestic, MD

## 2020-11-12 NOTE — Progress Notes (Signed)
Cardiology Office Note    Date:  11/14/2020   ID:  [name], DOB 04/11/1961, MRN [id number]  PCP:  Bonnie Baton, MD  Cardiologist:  Shelva Majestic, MD (sleep); Dr. Orene Desanctis  Sleep evaluation, last seen by me in 2015.  History of Present Illness:  [name] is a 60 y.o. female who is followed by Dr. Sallyanne Kuster for cardiology care.  She has a history of obesity, hypertension, hyperlipidemia, as well as remote TIA.  She was diagnosed with obstructive sleep apnea in June 2012 and overall AHI was 8.9/h.  She underwent CPAP titration study and was titrated up to 13 cm of water pressure with excellent benefit.  In 2015, prior machine malfunction and she received a new ResMed air sense 10 CPAP auto unit.  She was using a full facemask.  When I saw her following obtaining her new machine in November 2015 compliance was excellent.  At 13 cm water pressure AHI was excellent at 0.8/h.  With CPAP therapy, she had resolution of prior snoring, nonrestorative sleep, I did not have any residual daytime sleepiness.  Over the past 6 years she admits to continued CPAP use with excellent cotmpliance.  She recently had a message on her machine stating imaging "end-of-life" of the CPAP motor.  She she had called twice on medical, her DME company who advised that she needed to see me for sleep evaluation prior to getting a new machine.  Presently, she has had issues with insomnia since her father passed away.  Oftentimes she goes to bed very rarely but often wakes up around 2 AM and may stay up for some time before going back to bed.  She is unaware of breakthrough snoring.  A new Epworth Sleepiness Scale score was calculated in the office today and this endorsed at 11 as shown below:   Epworth Sleepiness Scale: Situation   Chance of Dozing/Sleeping (0 = never , 1 = slight chance , 2 = moderate chance , 3 = high chance )   sitting and reading 1   watching TV 3   sitting inactive in a public  place 2   being a passenger in a motor vehicle for an hour or more 1   lying down in the afternoon 3   sitting and talking to someone 0   sitting quietly after lunch (no alcohol) 1   while stopped for a few minutes in traffic as the driver 0   Total Score  11   She is unaware of breakthrough snoring, bruxism, hypnagogic hallucinations or cataplectic events.  She presents for sleep re-evaluation.    Past Medical History:  Diagnosis Date  . Anemia    takes iron supplement  . Anxiety   . Asthma    daily and prn inhalers  . Bone spur    Left foot  . Chest pain    a. 10/2015: NST showing possible mild ischemia but most consistent with breast attenuation. Low-risk study.   . Chronic diastolic CHF (congestive heart failure) ([place])   . Common migraine with intractable migraine 07/06/2018  . DDD (degenerative disc disease), cervical   . Degenerative disc disease   . Depression   . Diabetes mellitus ([place])   . Essential hypertension   . Family history of early CAD   . Former tobacco use   . GERD (gastroesophageal reflux disease)   . GERD (gastroesophageal reflux disease)   . Headache(784.0)    migraine-like  . History of TIA (transient ischemic  attack) early 2013   no weakness or deficits  . History of UTI   . Hyperlipidemia   . Insomnia   . Migraines   . Morbid obesity ([place])   . Partial tear of rotator cuff(726.13)   . Psoriatic arthritis ([place])   . Shoulder impingement 04/2012   left  . Sleep apnea    uses CPAP nightly    Past Surgical History:  Procedure Laterality Date  . ABDOMINAL HYSTERECTOMY  03/28/2002   complete  . ANTERIOR CERVICAL DECOMP/DISCECTOMY FUSION  02/18/2010   C6-7  . BREAST BIOPSY Right   . DILATION AND CURETTAGE OF UTERUS     2000  . FOOT SURGERY Right 2015  . KNEE ARTHROPLASTY    . ROTATOR CUFF REPAIR Left 2013  . TOTAL KNEE ARTHROPLASTY  09/07/2009   left  . TOTAL KNEE ARTHROPLASTY  10/13/2008   right  . TOTAL SHOULDER ARTHROPLASTY    .  UMBILICAL HERNIA REPAIR  03/28/2002  . wisidom    . WRIST SURGERY  2020    Current Medications: Outpatient Medications Prior to Visit  Medication Sig Dispense Refill  . acidophilus (RISAQUAD) CAPS Take 1 capsule by mouth every morning.     Marland Kitchen ALPRAZolam (XANAX) 0.5 MG tablet Take 0.5 mg by mouth 2 (two) times daily as needed.    Marland Kitchen aspirin 81 MG tablet Take 81 mg by mouth daily.    Marland Kitchen atorvastatin (LIPITOR) 20 MG tablet Take 20 mg by mouth every evening.     . baclofen (LIORESAL) 10 MG tablet Take 10-20 mg by mouth See admin instructions. Weekly as needed    . calcium carbonate (TUMS E-X 750) 750 MG chewable tablet     . chlorproMAZINE (THORAZINE) 25 MG tablet Take 25-50 mg by mouth See admin instructions. every 3 hours as needed max twice month  1  . diclofenac sodium (VOLTAREN) 1 % GEL Apply three grams to three large joints up to three times daily 3 Tube 3  . DULoxetine (CYMBALTA) 60 MG capsule Take 60 mg by mouth daily.   3  . ferrous sulfate 325 (65 FE) MG tablet Take 325 mg by mouth 2 (two) times a week.    . furosemide (LASIX) 40 MG tablet TAKE 1 TABLET BY MOUTH DAILY (Patient taking differently: 20 mg as needed.) 90 tablet 3  . HYDROcodone-acetaminophen (NORCO/VICODIN) 5-325 MG per tablet Take 1 tablet by mouth as needed for moderate pain.     . hydrOXYzine (ATARAX/VISTARIL) 25 MG tablet Take 25 mg by mouth as directed.    . irbesartan (AVAPRO) 75 MG tablet Take 37.5 mg by mouth. Take only if blood pressure above 130    . loratadine (CLARITIN) 10 MG tablet Take 10 mg by mouth daily.    . meloxicam (MOBIC) 15 MG tablet Take 7.5 mg by mouth 2 (two) times daily.  3  . metFORMIN (GLUCOPHAGE) 500 MG tablet Take 500 mg by mouth daily with breakfast.    . methocarbamol (ROBAXIN) 750 MG tablet Take 750 mg by mouth daily.    . Multiple Vitamin (MULITIVITAMIN WITH MINERALS) TABS Take 1 tablet by mouth daily.    . mupirocin nasal ointment (BACTROBAN) 2 % Place 1 application into the nose 2 (two)  times daily. Use one-half of tube in each . After application, press sides of nose together and gently massage. As Needed    . NON FORMULARY at bedtime. CPAP    . Olopatadine HCl (PATADAY OP) Place 1 drop into both  eyes as needed.     . ONE TOUCH ULTRA TEST test strip     . ONETOUCH DELICA LANCETS 65K MISC     . pantoprazole (PROTONIX) 40 MG tablet Take 40 mg by mouth daily.    . potassium chloride SA (K-DUR,KLOR-CON) 20 MEQ tablet Take 40 mEq by mouth 2 (two) times daily.   3  . Secukinumab, 300 MG Dose, (COSENTYX SENSOREADY, 300 MG,) 150 MG/ML SOAJ Inject 150 mg into the skin every 14 (fourteen) days. 6 mL 0  . topiramate (TOPAMAX) 100 MG tablet Take 200 mg by mouth at bedtime. Takes 2 tabs daily at bedtime    . vitamin B-12 (CYANOCOBALAMIN) 500 MCG tablet Take 500 mcg by mouth daily as needed.     . Lactobacillus (ACIDOPHILUS) CAPS capsule  (Patient not taking: Reported on 11/13/2020)    . mometasone (NASONEX) 50 MCG/ACT nasal spray Place 2 sprays into the nose as needed. (Patient not taking: Reported on 11/13/2020)    . mupirocin ointment (BACTROBAN) 2 %     . Azelastine HCl 0.15 % SOLN      No facility-administered medications prior to visit.     Allergies:   Patient has no known allergies.   Social History   Socioeconomic History  . Marital status: Married    Spouse name: Jenny Reichmann  . Number of children: 5  . Years of education: some college  . Highest education level: Not on file  Occupational History  . Occupation: Disabled  Tobacco Use  . Smoking status: Former Smoker    Packs/day: 0.50    Years: 20.00    Pack years: 10.00    Types: Cigarettes    Quit date: 10/02/1999    Years since quitting: 21.1  . Smokeless tobacco: Never Used  Vaping Use  . Vaping Use: Never used  Substance and Sexual Activity  . Alcohol use: No  . Drug use: No  . Sexual activity: Yes    Birth control/protection: None  Other Topics Concern  . Not on file  Social History Narrative   Lives with  husband, Jenny Reichmann   Caffeine use: 1 cup coffee every morning   Right handed    Social Determinants of Health   Financial Resource Strain: Not on file  Food Insecurity: Not on file  Transportation Needs: Not on file  Physical Activity: Not on file  Stress: Not on file  Social Connections: Not on file     Family History:  The patient's family history includes Breast cancer in her sister and sister; Cancer in her maternal grandmother and paternal grandfather; Coronary artery disease in her sister; Crohn's disease in her cousin, cousin, maternal aunt, and paternal aunt; Diabetes in her father, maternal grandmother, mother, paternal grandfather, sister, and sister; Hypertension in her father, maternal grandmother, and mother; Kidney disease in her father.   ROS General: Negative; No fevers, chills, or night sweats; positive for morbid obesity HEENT: Negative; No changes in vision or hearing, sinus congestion, difficulty swallowing Pulmonary: Negative; No cough, wheezing, shortness of breath, hemoptysis Cardiovascular: History of hypertension, hyperlipidemia GI: Positive for GERD GU: Negative; No dysuria, hematuria, or difficulty voiding Musculoskeletal: Negative; no myalgias, joint pain, or weakness Hematologic/Oncology: Negative; no easy bruising, bleeding Endocrine: Positive for diabetes mellitus Neuro: Remote TIA Skin: Negative; No rashes or skin lesions Psychiatric: Negative; No behavioral problems, depression Sleep: See HPI  Other comprehensive 14 point system review is negative.   PHYSICAL EXAM:   VS:  BP 130/86   Pulse 89  Ht '5\' 2"'  (1.575 m)   Wt 271 lb 9.6 oz (123.2 kg)   BMI 49.68 kg/m    Repeat blood pressure by me 122/74   Wt Readings from Last 3 Encounters:  11/13/20 272 lb 9.6 oz (123.7 kg)  11/12/20 271 lb 9.6 oz (123.2 kg)  07/29/20 270 lb (122.5 kg)    General: Alert, oriented, no distress.  Skin: normal turgor, no rashes, warm and dry HEENT: Normocephalic,  atraumatic. Pupils equal round and reactive to light; sclera anicteric; extraocular muscles intact;  Nose without nasal septal hypertrophy Mouth/Parynx benign; Mallinpatti scale 3/4 Neck: Thick neck; no JVD, no carotid bruits; normal carotid upstroke Lungs: clear to ausculatation and percussion; no wheezing or rales Chest wall: without tenderness to palpitation Heart: PMI not displaced, RRR, s1 s2 normal, 1/6 systolic murmur, no diastolic murmur, no rubs, gallops, thrills, or heaves Abdomen: Central adiposity; soft, nontender; no hepatosplenomehaly, BS+; abdominal aorta nontender and not dilated by palpation. Back: no CVA tenderness Pulses 2+ Musculoskeletal: full range of motion, normal strength, no joint deformities Extremities: no clubbing cyanosis or edema, Homan's sign negative  Neurologic: grossly nonfocal; Cranial nerves grossly wnl Psychologic: Normal mood and affect   Studies/Labs Reviewed:   ECG (independently read by me): Normal sinus rhythm at 89, no ectopy, normal intervals  Recent Labs: BMP Latest Ref Rng & Units 11/02/2020 08/10/2020 05/20/2020  Glucose 65 - 99 mg/dL 84 79 87  BUN 7 - 25 mg/dL '10 14 12  ' Creatinine 0.50 - 1.05 mg/dL 0.82 0.77 0.95  BUN/Creat Ratio 6 - 22 (calc) NOT APPLICABLE NOT APPLICABLE NOT APPLICABLE  Sodium 379 - 146 mmol/L 142 139 139  Potassium 3.5 - 5.3 mmol/L 4.9 4.8 4.7  Chloride 98 - 110 mmol/L 111(H) 108 108  CO2 20 - 32 mmol/L '24 24 25  ' Calcium 8.6 - 10.4 mg/dL 9.8 9.7 9.7     Hepatic Function Latest Ref Rng & Units 11/02/2020 08/10/2020 05/20/2020  Total Protein 6.1 - 8.1 g/dL 6.9 7.4 6.9  Albumin 3.5 - 5.0 g/dL - - -  AST 10 - 35 U/L '14 14 11  ' ALT 6 - 29 U/L '12 15 11  ' Alk Phosphatase 38 - 126 U/L - - -  Total Bilirubin 0.2 - 1.2 mg/dL 0.3 0.4 0.3    CBC Latest Ref Rng & Units 11/02/2020 08/10/2020 05/20/2020  WBC 3.8 - 10.8 Thousand/uL 8.0 8.0 8.2  Hemoglobin 11.7 - 15.5 g/dL 12.6 12.6 12.1  Hematocrit 35.0 - 45.0 % 38.5 39.8 38.1   Platelets 140 - 400 Thousand/uL 233 258 239   Lab Results  Component Value Date   MCV 84.6 11/02/2020   MCV 83.3 08/10/2020   MCV 83.2 05/20/2020   Lab Results  Component Value Date   TSH 0.95 10/22/2015   No results found for: HGBA1C   BNP    Component Value Date/Time   BNP 102.6 (H) 06/24/2015 0902    ProBNP No results found for: PROBNP   Lipid Panel     Component Value Date/Time   CHOL 143 03/12/2013 0200   TRIG 128 03/12/2013 0200   HDL 42 03/12/2013 0200   CHOLHDL 3.4 03/12/2013 0200   VLDL 26 03/12/2013 0200   LDLCALC 75 03/12/2013 0200     RADIOLOGY: No results found.   Additional studies/ records that were reviewed today include:  I reviewed the patient's original sleep study which was done at the [facility] in April 2012 which showed an AHI of 8.9/h.  Events were worse with supine sleep.  Oxygen nadir was 81% with non-REM sleep and 80% with REM sleep.    ASSESSMENT:    1. OSA (obstructive sleep apnea)   2. Essential hypertension   3. Morbid obesity ([place])   4. Diabetes mellitus type 2 in obese ([place])   5. Chronic diastolic heart failure [facility])     PLAN:  Ms. Reona Zendejas is a 60 year old female who has a history of morbid obesity, chronic diastolic heart failure, hypertension, hyperlipidemia, diabetes mellitus, remote TIA, and obstructive sleep apnea diagnosed in 12/04/2010.  She was started on CPAP therapy in 12/04/2010 and received a new machine in Dec 04, 2013 after her first machine malfunction.  She has been using her current machine for the past 6-1/2 years with excellent compliance and cannot sleep without it.  I obtained a download from October 13, 2020 through November 11, 2020 which confirms excellent compliance with average use at 10 hours and 4 minutes per night.  At 13 cm set pressure, AHI 0.9 cm of water.  There are some nights with occasional mask leak.  She is now received a message on her machine stating that motor failure is  imminent.  [employer] [facility] [employer] is her DME company.  She continues to have issues with insomnia since her father's death in December 04, 2005.  On her initial sleep study she had very loud snoring and at present is unaware of breakthrough snoring.  Her Epworth Sleepiness Scale score shows mild residual daytime sleepiness.  I will prescribe her a new ResMed air sense 10 CPAP auto unit and will initially set this at a pressure range of 12 to 18 cm of water.  She is morbidly obese.  Weight loss and increased exercise was recommended.  Blood pressure today is stable on current therapy consisting of irbesartan now at reduced dose of 37.5 mg daily.  She is diabetic on metformin.  She has a prescription for furosemide to take as needed.  She is on atorvastatin for hyperlipidemia and with her diabetes mellitus target LDL is less than 70.  She is followed by Dr. Shon Mathis for primary care.  I will see her in 3 months for follow-up evaluation following receiving her new machine.   Medication Adjustments/Labs and Tests Ordered: Current medicines are reviewed at length with the patient today.  Concerns regarding medicines are outlined above.  Medication changes, Labs and Tests ordered today are listed in the Patient Instructions below. Patient Instructions  Medication Instructions:  The current medical regimen is effective;  continue present plan and medications.  *If you need a refill on your cardiac medications before your next appointment, please call your pharmacy*   Follow-Up: At [facility], you and your health needs are our priority.  As part of our continuing mission to provide you with exceptional heart care, we have created designated Provider Care Teams.  These Care Teams include your primary Cardiologist (physician) and Advanced Practice Providers (APPs -  Physician Assistants and Nurse Practitioners) who all work together to provide you with the care you need, when you need it.  We recommend signing up for the  patient portal called "MyChart".  Sign up information is provided on this After Visit Summary.  MyChart is used to connect with patients for Virtual Visits (Telemedicine).  Patients are able to view lab/test results, encounter notes, upcoming appointments, etc.  Non-urgent messages can be sent to your provider as well.   To learn more about what you can do with MyChart, go  to [email or web address].    Your next appointment:   3 month(s)  The format for your next appointment:   In Person  Provider:   Shelva Majestic, MD       Signed, Shelva Majestic, MD  11/14/2020 11:08 AM    [facility] [address], [place], [place], [place]  [address] Phone: [phone]

## 2020-11-13 ENCOUNTER — Encounter: Payer: Self-pay | Admitting: Rheumatology

## 2020-11-13 ENCOUNTER — Ambulatory Visit: Payer: Medicare HMO | Admitting: Rheumatology

## 2020-11-13 VITALS — BP 135/75 | HR 82 | Ht 62.0 in | Wt 272.6 lb

## 2020-11-13 DIAGNOSIS — M47819 Spondylosis without myelopathy or radiculopathy, site unspecified: Secondary | ICD-10-CM

## 2020-11-13 DIAGNOSIS — Z8639 Personal history of other endocrine, nutritional and metabolic disease: Secondary | ICD-10-CM | POA: Diagnosis not present

## 2020-11-13 DIAGNOSIS — M7702 Medial epicondylitis, left elbow: Secondary | ICD-10-CM

## 2020-11-13 DIAGNOSIS — Z96653 Presence of artificial knee joint, bilateral: Secondary | ICD-10-CM

## 2020-11-13 DIAGNOSIS — I5032 Chronic diastolic (congestive) heart failure: Secondary | ICD-10-CM

## 2020-11-13 DIAGNOSIS — R252 Cramp and spasm: Secondary | ICD-10-CM

## 2020-11-13 DIAGNOSIS — M25512 Pain in left shoulder: Secondary | ICD-10-CM

## 2020-11-13 DIAGNOSIS — M5030 Other cervical disc degeneration, unspecified cervical region: Secondary | ICD-10-CM

## 2020-11-13 DIAGNOSIS — R911 Solitary pulmonary nodule: Secondary | ICD-10-CM

## 2020-11-13 DIAGNOSIS — G8929 Other chronic pain: Secondary | ICD-10-CM

## 2020-11-13 DIAGNOSIS — M7062 Trochanteric bursitis, left hip: Secondary | ICD-10-CM

## 2020-11-13 DIAGNOSIS — Z79899 Other long term (current) drug therapy: Secondary | ICD-10-CM | POA: Diagnosis not present

## 2020-11-13 DIAGNOSIS — M7701 Medial epicondylitis, right elbow: Secondary | ICD-10-CM

## 2020-11-13 DIAGNOSIS — Z8673 Personal history of transient ischemic attack (TIA), and cerebral infarction without residual deficits: Secondary | ICD-10-CM

## 2020-11-13 DIAGNOSIS — Z8669 Personal history of other diseases of the nervous system and sense organs: Secondary | ICD-10-CM

## 2020-11-13 DIAGNOSIS — Z7189 Other specified counseling: Secondary | ICD-10-CM

## 2020-11-13 DIAGNOSIS — Z8679 Personal history of other diseases of the circulatory system: Secondary | ICD-10-CM

## 2020-11-13 DIAGNOSIS — M62838 Other muscle spasm: Secondary | ICD-10-CM

## 2020-11-13 NOTE — Patient Instructions (Signed)
Standing Labs We placed an order today for your standing lab work.   Please have your standing labs drawn in May and every 3 months. TB Gold with next lab  If possible, please have your labs drawn 2 weeks prior to your appointment so that the provider can discuss your results at your appointment.  We have open lab daily Monday through Thursday from 1:30-4:30 PM and Friday from 1:30-4:00 PM at the office of Dr. Bo Merino, [facility].   Please be advised, all patients with office appointments requiring lab work will take precedents over walk-in lab work.  If possible, please come for your lab work on Monday and Friday afternoons, as you may experience shorter wait times. The office is located at [address], [place], [place], [place] [address] No appointment is necessary.   Labs are drawn by [facility]. Please bring your co-pay at the time of your lab draw.  You may receive a bill from [facility] for your lab work.  If you wish to have your labs drawn at another location, please call the office 24 hours in advance to send orders.  If you have any questions regarding directions or hours of operation,  please call [phone].   As a reminder, please drink plenty of water prior to coming for your lab work. Thanks!  COVID-19 vaccine recommendations:   COVID-19 vaccine is recommended for everyone (unless you are allergic to a vaccine component), even if you are on a medication that suppresses your immune system.   Is recommended that individuals on immunosuppressive therapy should get COVID-19 vaccine first 3 doses 1 month apart and a fourth dose (booster) 6 months from the third dose.  Do not take Tylenol or any anti-inflammatory medications (NSAIDs) 24 hours prior to the COVID-19 vaccination.   There is no direct evidence about the efficacy of the COVID-19 vaccine in individuals who are on medications that suppress the immune system.   Even if you are fully vaccinated,  and you are on any medications that suppress your immune system, please continue to wear a mask, maintain at least six feet social distance and practice hand hygiene.   If you develop a COVID-19 infection, please contact your PCP or our office to determine if you need monoclonal antibody infusion.  The booster vaccine is now available for immunocompromised patients.   Please see the following web sites for updated information.   [email or web address]  Vaccines You are taking a medication(s) that can suppress your immune system.  The following immunizations are recommended: . Flu annually . Covid-19  . Pneumonia (Pneumovax 23 and Prevnar 13 spaced at least 1 year apart) . Shingrix (after age 61)  Please check with your PCP to make sure you are up to date.   Heart Disease Prevention   Your inflammatory disease increases your risk of heart disease which includes heart attack, stroke, atrial fibrillation (irregular heartbeats), high blood pressure, heart failure and atherosclerosis (plaque in the arteries).  It is important to reduce your risk by:   . Keep blood pressure, cholesterol, and blood sugar at healthy levels   . Smoking Cessation   . Maintain a healthy weight  o BMI 20-25   . Eat a healthy diet  o Plenty of fresh fruit, vegetables, and whole grains  o Limit saturated fats, foods high in sodium, and added sugars  o DASH and Mediterranean diet   . Increase physical activity  o Recommend moderate physically activity for 150 minutes per week/ 30  minutes a day for five days a week These can be broken up into three separate ten-minute sessions during the day.   . Reduce Stress  . Meditation, slow breathing exercises, yoga, coloring books  . Dental visits twice a year   Shoulder Exercises Ask your health care provider which exercises are safe for you. Do exercises exactly as told by your health care provider  and adjust them as directed. It is normal to feel mild stretching, pulling, tightness, or discomfort as you do these exercises. Stop right away if you feel sudden pain or your pain gets worse. Do not begin these exercises until told by your health care provider. Stretching exercises External rotation and abduction This exercise is sometimes called corner stretch. This exercise rotates your arm outward (external rotation) and moves your arm out from your body (abduction). 1. Stand in a doorway with one of your feet slightly in front of the other. This is called a staggered stance. If you cannot reach your forearms to the door frame, stand facing a corner of a room. 2. Choose one of the following positions as told by your health care provider: ? Place your hands and forearms on the door frame above your head. ? Place your hands and forearms on the door frame at the height of your head. ? Place your hands on the door frame at the height of your elbows. 3. Slowly move your weight onto your front foot until you feel a stretch across your chest and in the front of your shoulders. Keep your head and chest upright and keep your abdominal muscles tight. 4. Hold for __________ seconds. 5. To release the stretch, shift your weight to your back foot. Repeat __________ times. Complete this exercise __________ times a day.   Extension, standing 1. Stand and hold a broomstick, a cane, or a similar object behind your back. ? Your hands should be a little wider than shoulder width apart. ? Your palms should face away from your back. 2. Keeping your elbows straight and your shoulder muscles relaxed, move the stick away from your body until you feel a stretch in your shoulders (extension). ? Avoid shrugging your shoulders while you move the stick. Keep your shoulder blades tucked down toward the middle of your back. 3. Hold for __________ seconds. 4. Slowly return to the starting position. Repeat __________ times.  Complete this exercise __________ times a day. Range-of-motion exercises Pendulum 1. Stand near a wall or a surface that you can hold onto for balance. 2. Bend at the waist and let your left / right arm hang straight down. Use your other arm to support you. Keep your back straight and do not lock your knees. 3. Relax your left / right arm and shoulder muscles, and move your hips and your trunk so your left / right arm swings freely. Your arm should swing because of the motion of your body, not because you are using your arm or shoulder muscles. 4. Keep moving your hips and trunk so your arm swings in the following directions, as told by your health care provider: ? Side to side. ? Forward and backward. ? In clockwise and counterclockwise circles. 5. Continue each motion for __________ seconds, or for as long as told by your health care provider. 6. Slowly return to the starting position. Repeat __________ times. Complete this exercise __________ times a day.   Shoulder flexion, standing 1. Stand and hold a broomstick, a cane, or a similar object. Place your  hands a little more than shoulder width apart on the object. Your left / right hand should be palm up, and your other hand should be palm down. 2. Keep your elbow straight and your shoulder muscles relaxed. Push the stick up with your healthy arm to raise your left / right arm in front of your body, and then over your head until you feel a stretch in your shoulder (flexion). ? Avoid shrugging your shoulder while you raise your arm. Keep your shoulder blade tucked down toward the middle of your back. 3. Hold for __________ seconds. 4. Slowly return to the starting position. Repeat __________ times. Complete this exercise __________ times a day.   Shoulder abduction, standing 1. Stand and hold a broomstick, a cane, or a similar object. Place your hands a little more than shoulder width apart on the object. Your left / right hand should be palm  up, and your other hand should be palm down. 2. Keep your elbow straight and your shoulder muscles relaxed. Push the object across your body toward your left / right side. Raise your left / right arm to the side of your body (abduction) until you feel a stretch in your shoulder. ? Do not raise your arm above shoulder height unless your health care provider tells you to do that. ? If directed, raise your arm over your head. ? Avoid shrugging your shoulder while you raise your arm. Keep your shoulder blade tucked down toward the middle of your back. 3. Hold for __________ seconds. 4. Slowly return to the starting position. Repeat __________ times. Complete this exercise __________ times a day. Internal rotation 1. Place your left / right hand behind your back, palm up. 2. Use your other hand to dangle an exercise band, a towel, or a similar object over your shoulder. Grasp the band with your left / right hand so you are holding on to both ends. 3. Gently pull up on the band until you feel a stretch in the front of your left / right shoulder. The movement of your arm toward the center of your body is called internal rotation. ? Avoid shrugging your shoulder while you raise your arm. Keep your shoulder blade tucked down toward the middle of your back. 4. Hold for __________ seconds. 5. Release the stretch by letting go of the band and lowering your hands. Repeat __________ times. Complete this exercise __________ times a day.   Strengthening exercises External rotation 1. Sit in a stable chair without armrests. 2. Secure an exercise band to a stable object at elbow height on your left / right side. 3. Place a soft object, such as a folded towel or a small pillow, between your left / right upper arm and your body to move your elbow about 4 inches (10 cm) away from your side. 4. Hold the end of the exercise band so it is tight and there is no slack. 5. Keeping your elbow pressed against the soft  object, slowly move your forearm out, away from your abdomen (external rotation). Keep your body steady so only your forearm moves. 6. Hold for __________ seconds. 7. Slowly return to the starting position. Repeat __________ times. Complete this exercise __________ times a day.   Shoulder abduction 1. Sit in a stable chair without armrests, or stand up. 2. Hold a __________ weight in your left / right hand, or hold an exercise band with both hands. 3. Start with your arms straight down and your left / right  palm facing in, toward your body. 4. Slowly lift your left / right hand out to your side (abduction). Do not lift your hand above shoulder height unless your health care provider tells you that this is safe. ? Keep your arms straight. ? Avoid shrugging your shoulder while you do this movement. Keep your shoulder blade tucked down toward the middle of your back. 5. Hold for __________ seconds. 6. Slowly lower your arm, and return to the starting position. Repeat __________ times. Complete this exercise __________ times a day.   Shoulder extension 1. Sit in a stable chair without armrests, or stand up. 2. Secure an exercise band to a stable object in front of you so it is at shoulder height. 3. Hold one end of the exercise band in each hand. Your palms should face each other. 4. Straighten your elbows and lift your hands up to shoulder height. 5. Step back, away from the secured end of the exercise band, until the band is tight and there is no slack. 6. Squeeze your shoulder blades together as you pull your hands down to the sides of your thighs (extension). Stop when your hands are straight down by your sides. Do not let your hands go behind your body. 7. Hold for __________ seconds. 8. Slowly return to the starting position. Repeat __________ times. Complete this exercise __________ times a day. Shoulder row 1. Sit in a stable chair without armrests, or stand up. 2. Secure an exercise  band to a stable object in front of you so it is at waist height. 3. Hold one end of the exercise band in each hand. Position your palms so that your thumbs are facing the ceiling (neutral position). 4. Bend each of your elbows to a 90-degree angle (right angle) and keep your upper arms at your sides. 5. Step back until the band is tight and there is no slack. 6. Slowly pull your elbows back behind you. 7. Hold for __________ seconds. 8. Slowly return to the starting position. Repeat __________ times. Complete this exercise __________ times a day. Shoulder press-ups 1. Sit in a stable chair that has armrests. Sit upright, with your feet flat on the floor. 2. Put your hands on the armrests so your elbows are bent and your fingers are pointing forward. Your hands should be about even with the sides of your body. 3. Push down on the armrests and use your arms to lift yourself off the chair. Straighten your elbows and lift yourself up as much as you comfortably can. ? Move your shoulder blades down, and avoid letting your shoulders move up toward your ears. ? Keep your feet on the ground. As you get stronger, your feet should support less of your body weight as you lift yourself up. 4. Hold for __________ seconds. 5. Slowly lower yourself back into the chair. Repeat __________ times. Complete this exercise __________ times a day.   Wall push-ups 1. Stand so you are facing a stable wall. Your feet should be about one arm-length away from the wall. 2. Lean forward and place your palms on the wall at shoulder height. 3. Keep your feet flat on the floor as you bend your elbows and lean forward toward the wall. 4. Hold for __________ seconds. 5. Straighten your elbows to push yourself back to the starting position. Repeat __________ times. Complete this exercise __________ times a day.   This information is not intended to replace advice given to you by your health care provider.  Make sure you discuss  any questions you have with your health care provider. Document Revised: 12/21/2018 Document Reviewed: 09/28/2018 Elsevier Patient Education  2021 [employer].

## 2020-11-14 ENCOUNTER — Encounter: Payer: Self-pay | Admitting: Cardiovascular Disease

## 2020-11-16 ENCOUNTER — Telehealth: Payer: Self-pay | Admitting: *Deleted

## 2020-11-16 NOTE — Telephone Encounter (Signed)
Order for  AirSense 10 Auto CPAP machine sent to [facility].

## 2020-12-02 ENCOUNTER — Other Ambulatory Visit: Payer: Self-pay | Admitting: *Deleted

## 2020-12-02 MED ORDER — COSENTYX SENSOREADY (300 MG) 150 MG/ML ~~LOC~~ SOAJ: 150.0000 mg | SUBCUTANEOUS | 0 refills | Status: DC

## 2020-12-02 NOTE — Telephone Encounter (Signed)
RX faxed from [facility]  Next Visit: 04/16/2021  Last Visit: 11/13/2020  Last Fill: 07/15/2020  [facility]:[id number]   Current Dose per office note 11/13/2020, Cosentyx 150 mg sq injections every 14 days  Labs: 11/02/2020, CBC and CMP WNL  TB Gold: 12/16/2019, negative  Okay to refill Cosentyx?

## 2020-12-03 DIAGNOSIS — M25562 Pain in left knee: Secondary | ICD-10-CM | POA: Diagnosis not present

## 2020-12-03 DIAGNOSIS — S83411A Sprain of medial collateral ligament of right knee, initial encounter: Secondary | ICD-10-CM | POA: Diagnosis not present

## 2020-12-03 DIAGNOSIS — M1612 Unilateral primary osteoarthritis, left hip: Secondary | ICD-10-CM | POA: Diagnosis not present

## 2020-12-07 DIAGNOSIS — G43709 Chronic migraine without aura, not intractable, without status migrainosus: Secondary | ICD-10-CM | POA: Diagnosis not present

## 2020-12-08 ENCOUNTER — Other Ambulatory Visit: Payer: Self-pay | Admitting: *Deleted

## 2020-12-09 DIAGNOSIS — M1612 Unilateral primary osteoarthritis, left hip: Secondary | ICD-10-CM | POA: Diagnosis not present

## 2020-12-09 DIAGNOSIS — M25652 Stiffness of left hip, not elsewhere classified: Secondary | ICD-10-CM | POA: Diagnosis not present

## 2020-12-09 DIAGNOSIS — R262 Difficulty in walking, not elsewhere classified: Secondary | ICD-10-CM | POA: Diagnosis not present

## 2020-12-09 DIAGNOSIS — M6281 Muscle weakness (generalized): Secondary | ICD-10-CM | POA: Diagnosis not present

## 2020-12-16 DIAGNOSIS — R262 Difficulty in walking, not elsewhere classified: Secondary | ICD-10-CM | POA: Diagnosis not present

## 2020-12-16 DIAGNOSIS — M25652 Stiffness of left hip, not elsewhere classified: Secondary | ICD-10-CM | POA: Diagnosis not present

## 2020-12-16 DIAGNOSIS — M1612 Unilateral primary osteoarthritis, left hip: Secondary | ICD-10-CM | POA: Diagnosis not present

## 2020-12-16 DIAGNOSIS — M6281 Muscle weakness (generalized): Secondary | ICD-10-CM | POA: Diagnosis not present

## 2020-12-23 DIAGNOSIS — R262 Difficulty in walking, not elsewhere classified: Secondary | ICD-10-CM | POA: Diagnosis not present

## 2020-12-23 DIAGNOSIS — M25652 Stiffness of left hip, not elsewhere classified: Secondary | ICD-10-CM | POA: Diagnosis not present

## 2020-12-23 DIAGNOSIS — S83411D Sprain of medial collateral ligament of right knee, subsequent encounter: Secondary | ICD-10-CM | POA: Diagnosis not present

## 2020-12-23 DIAGNOSIS — M6281 Muscle weakness (generalized): Secondary | ICD-10-CM | POA: Diagnosis not present

## 2021-01-04 DIAGNOSIS — R21 Rash and other nonspecific skin eruption: Secondary | ICD-10-CM | POA: Diagnosis not present

## 2021-01-05 DIAGNOSIS — K921 Melena: Secondary | ICD-10-CM | POA: Diagnosis not present

## 2021-01-06 DIAGNOSIS — S83411D Sprain of medial collateral ligament of right knee, subsequent encounter: Secondary | ICD-10-CM | POA: Diagnosis not present

## 2021-01-06 DIAGNOSIS — M1612 Unilateral primary osteoarthritis, left hip: Secondary | ICD-10-CM | POA: Diagnosis not present

## 2021-01-06 DIAGNOSIS — M6281 Muscle weakness (generalized): Secondary | ICD-10-CM | POA: Diagnosis not present

## 2021-01-06 DIAGNOSIS — S73102D Unspecified sprain of left hip, subsequent encounter: Secondary | ICD-10-CM | POA: Diagnosis not present

## 2021-01-14 DIAGNOSIS — M1612 Unilateral primary osteoarthritis, left hip: Secondary | ICD-10-CM | POA: Diagnosis not present

## 2021-01-19 ENCOUNTER — Telehealth: Payer: Self-pay

## 2021-01-19 NOTE — Progress Notes (Signed)
Office Visit Note  Patient: [name]             Date of Birth: 03/11/1961           MRN: [id number]             PCP: Shon Baton, MD Referring: Shon Baton, MD Visit Date: 01/20/2021 Occupation: '@GUAROCC' @  Subjective:  Rash   History of Present Illness: [name] is a 60 y.o. female with history of spondylopathy and DDD.  She is on cosentyx 150 mg sq injections every 14 days.  She has not missed any doses recently.  According to the patient over the past several months she has been experiencing a rash starting on the day of her Cosentyx injections.  She states that the rash is not at the injection site (abdomen) and typically last 3 to 4 days. The rash has appeared on her torso, upper arm, and behind the left knee. She denies any other identifiable trigger.  She has tried taking Benadryl as well as using several topical agents including cortisone10, Tiger balm, alcohol, and peroxide.  She states that she also tried taking Allegra but developed significant drowsiness.  She was evaluated by Dr. Virgina Jock on 01/06/21 for the rash and was given a short course of prednisone which resolved the rash.  She states that her most recent Cosentyx injection was Monday morning and she developed a rash on the back of her left knee in the afternoon.  She has been experiencing significant itching and redness despite trying several topical agents.  She does not have any rash at the injection site. She continues to find Cosentyx to be effective at managing her symptoms.  She has not had any flares recently.  She denies any SI joint pain at this time.  She denies any hip pain currently.  She has not had any Achilles tendinitis or plantar fasciitis.      Activities of Daily Living:  Patient reports morning stiffness for 15 minutes.   Patient Reports nocturnal pain.  Difficulty dressing/grooming: Denies Difficulty climbing stairs: Denies Difficulty getting out of chair:  Denies Difficulty using hands for taps, buttons, cutlery, and/or writing: Denies  Review of Systems  Constitutional: Negative for fatigue.  HENT: Positive for mouth dryness. Negative for mouth sores and nose dryness.   Eyes: Negative for pain, itching and dryness.  Respiratory: Negative for shortness of breath and difficulty breathing.   Cardiovascular: Negative for chest pain and palpitations.  Gastrointestinal: Negative for blood in stool, constipation and diarrhea.  Endocrine: Negative for increased urination.  Genitourinary: Negative for difficulty urinating.  Musculoskeletal: Positive for morning stiffness. Negative for arthralgias, joint pain, joint swelling, myalgias, muscle tenderness and myalgias.  Skin: Positive for rash. Negative for color change.  Allergic/Immunologic: Negative for susceptible to infections.  Neurological: Positive for numbness and headaches. Negative for dizziness, memory loss and weakness.  Hematological: Positive for bruising/bleeding tendency.  Psychiatric/Behavioral: Negative for confusion.    PMFS History:  Patient Active Problem List   Diagnosis Date Noted  . De Quervain's disease (radial styloid tenosynovitis) 11/27/2018  . Vertigo 10/18/2018  . Common migraine with intractable migraine 07/06/2018  . Diabetes mellitus type 2 in obese ([place]) 06/29/2018  . Spondyloarthropathy 07/26/2016  . High risk medication use 07/26/2016  . Chronic low back pain 07/26/2016  . H/O total knee replacement, bilateral 07/26/2016  . Chronic diastolic heart failure ([place]) 11/29/2015  . Dyspnea 06/03/2015  . Lung nodule seen on imaging study, CTA of  chest, 6 mm nodule, followed by Dr. Virgina Jock 03/25/2013  . Family history of coronary artery disease 03/12/2013  . Super obese 03/12/2013  . Chest pain with low risk of acute coronary syndrome - most likely musculoskeletal, negative nuc study 03/11/2013  . Full thickness rotator cuff tear 05/15/2012  . Hypercholesterolemia    . DJD (degenerative joint disease), cervical   . OSA (obstructive sleep apnea)   . Essential hypertension   . History of TIA (transient ischemic attack) 9/13   . Partial tear of rotator cuff(726.13)   . Shoulder impingement 04/12/2012    Past Medical History:  Diagnosis Date  . Anemia    takes iron supplement  . Anxiety   . Asthma    daily and prn inhalers  . Bone spur    Left foot  . Chest pain    a. 10/2015: NST showing possible mild ischemia but most consistent with breast attenuation. Low-risk study.   . Chronic diastolic CHF (congestive heart failure) ([place])   . Common migraine with intractable migraine 07/06/2018  . DDD (degenerative disc disease), cervical   . Degenerative disc disease   . Depression   . Diabetes mellitus ([place])   . Essential hypertension   . Family history of early CAD   . Former tobacco use   . GERD (gastroesophageal reflux disease)   . GERD (gastroesophageal reflux disease)   . Headache(784.0)    migraine-like  . History of TIA (transient ischemic attack) early 2013   no weakness or deficits  . History of UTI   . Hyperlipidemia   . Insomnia   . Migraines   . Morbid obesity ([place])   . Partial tear of rotator cuff(726.13)   . Psoriatic arthritis ([place])   . Shoulder impingement 04/2012   left  . Sleep apnea    uses CPAP nightly    Family History  Problem Relation Age of Onset  . Diabetes Mother   . Hypertension Mother   . Kidney disease Father   . Hypertension Father   . Diabetes Father   . Breast cancer Sister   . Diabetes Sister   . Breast cancer Sister   . Cancer Maternal Grandmother   . Diabetes Maternal Grandmother   . Hypertension Maternal Grandmother   . Diabetes Paternal Grandfather   . Cancer Paternal Grandfather   . Coronary artery disease Sister   . Diabetes Sister   . Crohn's disease Maternal Aunt   . Crohn's disease Cousin   . Crohn's disease Cousin   . Crohn's disease Paternal Aunt    Past Surgical History:   Procedure Laterality Date  . ABDOMINAL HYSTERECTOMY  03/28/2002   complete  . ANTERIOR CERVICAL DECOMP/DISCECTOMY FUSION  02/18/2010   C6-7  . BREAST BIOPSY Right   . DILATION AND CURETTAGE OF UTERUS     2000  . FOOT SURGERY Right 2015  . KNEE ARTHROPLASTY    . ROTATOR CUFF REPAIR Left 2013  . TOTAL KNEE ARTHROPLASTY  09/07/2009   left  . TOTAL KNEE ARTHROPLASTY  10/13/2008   right  . TOTAL SHOULDER ARTHROPLASTY    . UMBILICAL HERNIA REPAIR  03/28/2002  . wisidom    . WRIST SURGERY  2020   Social History   Social History Narrative   Lives with husband, Jenny Reichmann   Caffeine use: 1 cup coffee every morning   Right handed    Immunization History  Administered Date(s) Administered  . Influenza-Unspecified 06/05/2018  . PFIZER(Purple Top)SARS-COV-2 Vaccination 11/28/2019, 12/18/2019, 06/02/2020  Objective: Vital Signs: BP (!) 150/83 (BP Location: Left Wrist, Patient Position: Sitting, Cuff Size: Normal)   Pulse 84   Ht '5\' 2"'  (1.575 m)   Wt 272 lb (123.4 kg)   BMI 49.75 kg/m    Physical Exam Vitals and nursing note reviewed.  Constitutional:      Appearance: She is well-developed.  HENT:     Head: Normocephalic and atraumatic.  Eyes:     Conjunctiva/sclera: Conjunctivae normal.  Pulmonary:     Effort: Pulmonary effort is normal.  Abdominal:     Palpations: Abdomen is soft.  Musculoskeletal:     Cervical back: Normal range of motion.  Skin:    General: Skin is warm and dry.     Capillary Refill: Capillary refill takes less than 2 seconds.     Comments: Multiple (4) discrete, erythematous patches on the posterior aspect of the left knee noted. No injection site reaction on abdomen.    Neurological:     Mental Status: She is alert and oriented to person, place, and time.  Psychiatric:        Behavior: Behavior normal.      Musculoskeletal Exam: C-spine has good range of motion with no discomfort. No midline spinal tenderness.  No SI joint tenderness. Shoulder  joints, elbow joints, wrist joints, MCPs, PIPs, DIPs have good range of motion with no synovitis.  She was able to make a complete fist bilaterally.  Bilateral knee replacements have good range of motion with mild warmth but no effusion.  Ankle joints have good range of motion with no tenderness or joint swelling.  CDAI Exam: CDAI Score: -- Patient Global: --; Provider Global: -- Swollen: --; Tender: -- Joint Exam 01/20/2021   No joint exam has been documented for this visit   There is currently no information documented on the homunculus. Go to the Rheumatology activity and complete the homunculus joint exam.  Investigation: No additional findings.  Imaging: No results found.  Recent Labs: Lab Results  Component Value Date   WBC 8.0 11/02/2020   HGB 12.6 11/02/2020   PLT 233 11/02/2020   NA 142 11/02/2020   K 4.9 11/02/2020   CL 111 (H) 11/02/2020   CO2 24 11/02/2020   GLUCOSE 84 11/02/2020   BUN 10 11/02/2020   CREATININE 0.82 11/02/2020   BILITOT 0.3 11/02/2020   ALKPHOS 119 06/27/2018   AST 14 11/02/2020   ALT 12 11/02/2020   PROT 6.9 11/02/2020   ALBUMIN 3.6 06/27/2018   CALCIUM 9.8 11/02/2020   GFRAA 91 11/02/2020   QFTBGOLDPLUS NEGATIVE 12/16/2019    Speciality Comments: Patient recieves Cosentyx through [employer] 04/29/2019  Procedures:  No procedures performed Allergies: Patient has no known allergies.   Assessment / Plan:     Visit Diagnoses: Spondyloarthropathy - MRI positive for sacroiliitis, HLA-B27 negative, elevated ESR: She has not had any signs or symptoms of a flare recently.  She is clinically doing well on Cosentyx 150 mg sq injections every 14 days.  She has not missed any doses of Cosentyx recently.  C-spine, thoracic spine, lumbar spine have good range of motion with no discomfort at this time.  No midline spinal tenderness or SI joint tenderness.  She is not having any discomfort in her hip joints currently.  She  has been experiencing scattered rashes after injecting Cosentyx every 2 weeks.  No injection site reaction.  These rashes have been itchy and have been lasting 3-4 days.  No other associated symptoms  or triggers. Her most recent injection was on 01/18/2021 and she developed a rash on the posterior aspect of her left knee that afternoon.  She has tried applying topical agents with minimal improvement in her symptoms.  We discussed different treatment options such as switching to Taltz, but I discussed that Donnetta Hail is more commonly associated with injection site reactions.  Since she is clinically doing so well on Cosentyx we do not want to make any major medication changes/change to a new class of medication at this time.  We discussed taking Zyrtec 2 days prior to day, the day of, and 2 days after each Cosentyx injection.  She can continue to use topical Cortizone or benadryl cream topically as needed.  If she does not notice improvement with zyrtec we discussed trying benadryl at bedtime.  Discussed potential side effects.  If she develops a rash with her next injection we discussed holding Cosentyx to see if the rash returns on its own.  Discussed the patient case with Knox Saliva, [facility] and she agreed with the plan.  She will follow-up in 3 months.   High risk medication use - Cosentyx 150 mg sq injections every 14 days.  TB gold negative on 12/16/19.  Order released today.  CBC and CMP updated on 11/02/20.  She is due to update lab work. Orders released.  Her next lab work will be due in August and every 3 months.    - Plan: CBC with Differential/Platelet, COMPLETE METABOLIC PANEL WITH GFR, QuantiFERON-TB Gold Plus Advised to hold cosentyx if she develops signs or symptoms of an infection and to resume once the infection has completely cleared.   Screening for tuberculosis - Order for TB gold released today. Plan: QuantiFERON-TB Gold Plus  Medial epicondylitis of both elbows: Resolved.  No tenderness to  palpation.   DDD (degenerative disc disease), cervical: She has good ROM with no discomfort on exam.  No symptoms of radiculopathy.   Trapezius muscle spasm: Resolved.   Muscle cramping: Resolved.   Trochanteric bursitis, left hip: Resolved.   H/O total knee replacement, bilateral - According to the patient she had her knees replaced 10 to 11 years ago by Dr. Noemi Chapel. Doing well.  Good ROM with no discomfort.   Other medical conditions are listed as follows:   Lung nodule seen on imaging study, CTA of chest, 6 mm nodule, followed by Dr. Virgina Jock  History of hyperlipidemia  History of diabetes mellitus  History of hypertension  History of sleep apnea  History of TIA (transient ischemic attack)  Chronic diastolic heart failure ([place])    Orders: Orders Placed This Encounter  Procedures  . CBC with Differential/Platelet  . COMPLETE METABOLIC PANEL WITH GFR  . QuantiFERON-TB Gold Plus   No orders of the defined types were placed in this encounter.    Follow-Up Instructions: Return in about 3 months (around 04/22/2021) for Spondyloarthropathy, DDD.   Ofilia Neas, PA-C  Note - This record has been created using Dragon software.  Chart creation errors have been sought, but may not always  have been located. Such creation errors do not reflect on  the standard of medical care.

## 2021-01-19 NOTE — Telephone Encounter (Signed)
Patient called stating she took her Cosentyx injection yesterday morning and by the afternoon began breaking out with an itchy rash on the back of her knee and calf.  Patient states she had the same reaction in April.  Patient states she took her injection on 4/25 and on 4/27 had rash on leg, buttocks, and upper thigh.  Patient states Dr. Virgina Jock prescribed Prednisone to take for 4 days.  Patient states she has been having rashes on various parts of her body over the past couple of months, but it would clear up before Dr. Virgina Jock could see an active outbreak.  Patient states now she is wondering if she is starting to react to the Cosentyx.  Patient states she is due to order a refill and requested a return call to let her know how to move forward.

## 2021-01-19 NOTE — Telephone Encounter (Signed)
Please schedule an appointment to evaluate the rash.

## 2021-01-19 NOTE — Telephone Encounter (Signed)
I called patient, patient scheduled 01/20/2021

## 2021-01-20 ENCOUNTER — Encounter: Payer: Self-pay | Admitting: Physician Assistant

## 2021-01-20 ENCOUNTER — Ambulatory Visit (INDEPENDENT_AMBULATORY_CARE_PROVIDER_SITE_OTHER): Payer: Medicare HMO | Admitting: Physician Assistant

## 2021-01-20 ENCOUNTER — Other Ambulatory Visit: Payer: Self-pay

## 2021-01-20 VITALS — BP 150/83 | HR 84 | Ht 62.0 in | Wt 272.0 lb

## 2021-01-20 DIAGNOSIS — R252 Cramp and spasm: Secondary | ICD-10-CM | POA: Diagnosis not present

## 2021-01-20 DIAGNOSIS — M62838 Other muscle spasm: Secondary | ICD-10-CM

## 2021-01-20 DIAGNOSIS — R262 Difficulty in walking, not elsewhere classified: Secondary | ICD-10-CM | POA: Diagnosis not present

## 2021-01-20 DIAGNOSIS — M7702 Medial epicondylitis, left elbow: Secondary | ICD-10-CM

## 2021-01-20 DIAGNOSIS — Z79899 Other long term (current) drug therapy: Secondary | ICD-10-CM

## 2021-01-20 DIAGNOSIS — Z8639 Personal history of other endocrine, nutritional and metabolic disease: Secondary | ICD-10-CM

## 2021-01-20 DIAGNOSIS — M7701 Medial epicondylitis, right elbow: Secondary | ICD-10-CM

## 2021-01-20 DIAGNOSIS — M1612 Unilateral primary osteoarthritis, left hip: Secondary | ICD-10-CM | POA: Diagnosis not present

## 2021-01-20 DIAGNOSIS — M25652 Stiffness of left hip, not elsewhere classified: Secondary | ICD-10-CM | POA: Diagnosis not present

## 2021-01-20 DIAGNOSIS — M7062 Trochanteric bursitis, left hip: Secondary | ICD-10-CM | POA: Diagnosis not present

## 2021-01-20 DIAGNOSIS — Z111 Encounter for screening for respiratory tuberculosis: Secondary | ICD-10-CM

## 2021-01-20 DIAGNOSIS — Z8679 Personal history of other diseases of the circulatory system: Secondary | ICD-10-CM

## 2021-01-20 DIAGNOSIS — M47819 Spondylosis without myelopathy or radiculopathy, site unspecified: Secondary | ICD-10-CM

## 2021-01-20 DIAGNOSIS — R911 Solitary pulmonary nodule: Secondary | ICD-10-CM | POA: Diagnosis not present

## 2021-01-20 DIAGNOSIS — M5030 Other cervical disc degeneration, unspecified cervical region: Secondary | ICD-10-CM

## 2021-01-20 DIAGNOSIS — Z96653 Presence of artificial knee joint, bilateral: Secondary | ICD-10-CM

## 2021-01-20 DIAGNOSIS — Z8673 Personal history of transient ischemic attack (TIA), and cerebral infarction without residual deficits: Secondary | ICD-10-CM

## 2021-01-20 DIAGNOSIS — Z8669 Personal history of other diseases of the nervous system and sense organs: Secondary | ICD-10-CM

## 2021-01-20 DIAGNOSIS — I5032 Chronic diastolic (congestive) heart failure: Secondary | ICD-10-CM

## 2021-01-20 DIAGNOSIS — M6281 Muscle weakness (generalized): Secondary | ICD-10-CM | POA: Diagnosis not present

## 2021-01-21 NOTE — Progress Notes (Signed)
CBC stable. CMP WNL.

## 2021-01-22 LAB — COMPLETE METABOLIC PANEL WITH GFR
AG Ratio: 1.3 (calc) (ref 1.0–2.5)
ALT: 16 U/L (ref 6–29)
AST: 13 U/L (ref 10–35)
Albumin: 4 g/dL (ref 3.6–5.1)
Alkaline phosphatase (APISO): 104 U/L (ref 37–153)
BUN: 13 mg/dL (ref 7–25)
CO2: 24 mmol/L (ref 20–32)
Calcium: 9.4 mg/dL (ref 8.6–10.4)
Chloride: 110 mmol/L (ref 98–110)
Creat: 0.92 mg/dL (ref 0.50–1.05)
GFR, Est African American: 79 mL/min/{1.73_m2} (ref >=60)
GFR, Est Non African American: 68 mL/min/{1.73_m2} (ref >=60)
Globulin: 3.2 g/dL (calc) (ref 1.9–3.7)
Glucose, Bld: 97 mg/dL (ref 65–99)
Potassium: 5.1 mmol/L (ref 3.5–5.3)
Sodium: 141 mmol/L (ref 135–146)
Total Bilirubin: 0.3 mg/dL (ref 0.2–1.2)
Total Protein: 7.2 g/dL (ref 6.1–8.1)

## 2021-01-22 LAB — CBC WITH DIFFERENTIAL/PLATELET
Absolute Monocytes: 624 cells/uL (ref 200–950)
Basophils Absolute: 41 cells/uL (ref 0–200)
Basophils Relative: 0.5 %
Eosinophils Absolute: 275 cells/uL (ref 15–500)
Eosinophils Relative: 3.4 %
HCT: 38.7 % (ref 35.0–45.0)
Hemoglobin: 12.1 g/dL (ref 11.7–15.5)
Lymphs Abs: 2041 cells/uL (ref 850–3900)
MCH: 26.5 pg — ABNORMAL LOW (ref 27.0–33.0)
MCHC: 31.3 g/dL — ABNORMAL LOW (ref 32.0–36.0)
MCV: 84.9 fL (ref 80.0–100.0)
MPV: 9.5 fL (ref 7.5–12.5)
Monocytes Relative: 7.7 %
Neutro Abs: 5119 cells/uL (ref 1500–7800)
Neutrophils Relative %: 63.2 %
Platelets: 223 10*3/uL (ref 140–400)
RBC: 4.56 10*6/uL (ref 3.80–5.10)
RDW: 14 % (ref 11.0–15.0)
Total Lymphocyte: 25.2 %
WBC: 8.1 10*3/uL (ref 3.8–10.8)

## 2021-01-22 LAB — QUANTIFERON-TB GOLD PLUS
Mitogen-NIL: >10 IU/mL
NIL: 0.03 IU/mL
QuantiFERON-TB Gold Plus: NEGATIVE
TB1-NIL: <0 IU/mL
TB2-NIL: 0.01 IU/mL

## 2021-01-22 NOTE — Progress Notes (Signed)
TB gold negative

## 2021-02-03 DIAGNOSIS — Z0100 Encounter for examination of eyes and vision without abnormal findings: Secondary | ICD-10-CM | POA: Diagnosis not present

## 2021-02-09 DIAGNOSIS — G4733 Obstructive sleep apnea (adult) (pediatric): Secondary | ICD-10-CM | POA: Diagnosis not present

## 2021-02-23 ENCOUNTER — Other Ambulatory Visit: Payer: Self-pay | Admitting: *Deleted

## 2021-02-23 MED ORDER — COSENTYX SENSOREADY (300 MG) 150 MG/ML ~~LOC~~ SOAJ: 150.0000 mg | SUBCUTANEOUS | 0 refills | Status: DC

## 2021-02-23 NOTE — Telephone Encounter (Signed)
Next Visit: 06/22/2021  Last Visit: 01/20/2021  Last Fill: 12/02/2020  ID:[id number]   Current Dose per office note 01/20/2021: Cosentyx 150 mg sq injections every 14 days  Labs: 01/20/2021, CBC stable. CMP WNL.   TB Gold: 01/20/2021, negative   Okay to refill Cosentyx?

## 2021-03-02 ENCOUNTER — Telehealth: Payer: Self-pay | Admitting: Rheumatology

## 2021-03-02 DIAGNOSIS — M791 Myalgia, unspecified site: Secondary | ICD-10-CM | POA: Diagnosis not present

## 2021-03-02 DIAGNOSIS — G43709 Chronic migraine without aura, not intractable, without status migrainosus: Secondary | ICD-10-CM | POA: Diagnosis not present

## 2021-03-02 DIAGNOSIS — G518 Other disorders of facial nerve: Secondary | ICD-10-CM | POA: Diagnosis not present

## 2021-03-02 DIAGNOSIS — M542 Cervicalgia: Secondary | ICD-10-CM | POA: Diagnosis not present

## 2021-03-02 NOTE — Telephone Encounter (Signed)
FYI: Patient called to let you know she did get the second [place] booster on June 13,2022.

## 2021-03-02 NOTE — Telephone Encounter (Signed)
Added to patient's chart.

## 2021-03-11 ENCOUNTER — Ambulatory Visit: Payer: Medicare HMO | Admitting: Cardiovascular Disease

## 2021-03-11 DIAGNOSIS — G4733 Obstructive sleep apnea (adult) (pediatric): Secondary | ICD-10-CM | POA: Diagnosis not present

## 2021-03-12 DIAGNOSIS — J310 Chronic rhinitis: Secondary | ICD-10-CM | POA: Diagnosis not present

## 2021-03-12 DIAGNOSIS — R062 Wheezing: Secondary | ICD-10-CM | POA: Diagnosis not present

## 2021-03-12 DIAGNOSIS — H1045 Other chronic allergic conjunctivitis: Secondary | ICD-10-CM | POA: Diagnosis not present

## 2021-03-12 DIAGNOSIS — J383 Other diseases of vocal cords: Secondary | ICD-10-CM | POA: Diagnosis not present

## 2021-03-16 ENCOUNTER — Encounter: Payer: Self-pay | Admitting: Cardiovascular Disease

## 2021-03-16 ENCOUNTER — Ambulatory Visit: Payer: Medicare HMO | Admitting: Cardiovascular Disease

## 2021-03-16 ENCOUNTER — Other Ambulatory Visit: Payer: Self-pay

## 2021-03-16 VITALS — BP 150/82 | HR 92 | Ht 62.0 in | Wt 273.8 lb

## 2021-03-16 DIAGNOSIS — E1169 Type 2 diabetes mellitus with other specified complication: Secondary | ICD-10-CM

## 2021-03-16 DIAGNOSIS — E669 Obesity, unspecified: Secondary | ICD-10-CM

## 2021-03-16 DIAGNOSIS — E785 Hyperlipidemia, unspecified: Secondary | ICD-10-CM

## 2021-03-16 DIAGNOSIS — I10 Essential (primary) hypertension: Secondary | ICD-10-CM

## 2021-03-16 DIAGNOSIS — G4733 Obstructive sleep apnea (adult) (pediatric): Secondary | ICD-10-CM | POA: Diagnosis not present

## 2021-03-16 NOTE — Patient Instructions (Signed)
Medication Instructions:  No Changes  *If you need a refill on your cardiac medications before your next appointment, please call your pharmacy*   Lab Work: No Labs If you have labs (blood work) drawn today and your tests are completely normal, you will receive your results only by: [place] (if you have MyChart) OR A paper copy in the mail If you have any lab test that is abnormal or we need to change your treatment, we will call you to review the results.   Testing/Procedures: No Testing   Follow-Up: At [facility], you and your health needs are our priority.  As part of our continuing mission to provide you with exceptional heart care, we have created designated Provider Care Teams.  These Care Teams include your primary Cardiologist (physician) and Advanced Practice Providers (APPs -  Physician Assistants and Nurse Practitioners) who all work together to provide you with the care you need, when you need it.  We recommend signing up for the patient portal called "MyChart".  Sign up information is provided on this After Visit Summary.  MyChart is used to connect with patients for Virtual Visits (Telemedicine).  Patients are able to view lab/test results, encounter notes, upcoming appointments, etc.  Non-urgent messages can be sent to your provider as well.   To learn more about what you can do with MyChart, go to [email or web address].    Your next appointment:   1 year(s)  The format for your next appointment:   In Person  Provider:   Shelva Majestic, MD

## 2021-03-16 NOTE — Progress Notes (Signed)
Cardiology Office Note    Date:  03/19/2021   ID:  [name], DOB 1961/06/24, MRN [id number]  PCP:  Shon Baton, MD  Cardiologist:  Shelva Majestic, MD (sleep); Dr. Orene Desanctis  4 month F/U Sleep evaluation   History of Present Illness:  [name] is a 60 y.o. female who is followed by Dr. Sallyanne Kuster for cardiology care.  She has a history of obesity, hypertension, hyperlipidemia, as well as remote TIA.  She was diagnosed with obstructive sleep apnea in June 2012 and overall AHI was 8.9/h.  She underwent CPAP titration study and was titrated up to 13 cm of water pressure with excellent benefit.  In 2015, prior machine malfunction and she received a new ResMed air sense 10 CPAP auto unit.  She was using a full facemask.  When I saw her following obtaining her new machine in November 2015 compliance was excellent.  At 13 cm water pressure AHI was excellent at 0.8/h.  With CPAP therapy, she had resolution of prior snoring, nonrestorative sleep, I did not have any residual daytime sleepiness.  I saw her on November 12, 2020 after not having seen her since 2015.  Over the past 6 years she admits to continued CPAP use with excellent cotmpliance.  She recently had a message on her machine stating imaging "end-of-life" of the CPAP motor.  She she had called twice on medical, her DME company who advised that she needed to see me for sleep evaluation prior to getting a new machine.  At her March 2022 evaluation she admitted to issues with insomnia since her father passed away.  Oftentimes she goes to bed very rarely but often wakes up around 2 AM and may stay up for some time before going back to bed.  She is unaware of breakthrough snoring.  A new Epworth Sleepiness Scale score was calculated in the office today and this endorsed at 11 as shown below:   Epworth Sleepiness Scale: Situation   Chance of Dozing/Sleeping (0 = never , 1 = slight chance , 2 = moderate chance , 3 = high chance )    sitting and reading 1   watching TV 3   sitting inactive in a public place 2   being a passenger in a motor vehicle for an hour or more 1   lying down in the afternoon 3   sitting and talking to someone 0   sitting quietly after lunch (no alcohol) 1   while stopped for a few minutes in traffic as the driver 0   Total Score  11   She was unaware of breakthrough snoring, bruxism, hypnagogic hallucinations or cataplectic events.    Since her November 12, 2020 evaluation, she received a new ResMed air sense 11 AutoSet unit on Feb 09, 2021.  She notes that her machine is even more quiet than her previous one.  I was able to obtain a new download from February 15, 2019 through through July 6 12/2020.  Compliance is excellent with 100% use.  Average use is 8 hours and 44 minutes.  The unit is set at a minimum pressure of 12 with maximum of 18.  Her 95th percentile pressure is 15.1 with a maximum average pressure at 16.9.  AHI is excellent at 1.0.  She is unaware of any breakthrough snoring.  She is sleeping well.  She denies residual daytime sleepiness.  An Epworth Sleepiness Scale score was recalculated in the office today and this endorsed at 5.  She presents for reevaluation.   Past Medical History:  Diagnosis Date   Anemia    takes iron supplement   Anxiety    Asthma    daily and prn inhalers   Bone spur    Left foot   Chest pain    a. 10/2015: NST showing possible mild ischemia but most consistent with breast attenuation. Low-risk study.    Chronic diastolic CHF (congestive heart failure) (HCC)    Common migraine with intractable migraine 07/06/2018   DDD (degenerative disc disease), cervical    Degenerative disc disease    Depression    Diabetes mellitus ([place])    Essential hypertension    Family history of early CAD    Former tobacco use    GERD (gastroesophageal reflux disease)    GERD (gastroesophageal reflux disease)    Headache(784.0)    migraine-like   History of TIA (transient  ischemic attack) early 2013   no weakness or deficits   History of UTI    Hyperlipidemia    Insomnia    Migraines    Morbid obesity ([place])    Partial tear of rotator cuff(726.13)    Psoriatic arthritis ([place])    Shoulder impingement 04/2012   left   Sleep apnea    uses CPAP nightly    Past Surgical History:  Procedure Laterality Date   ABDOMINAL HYSTERECTOMY  03/28/2002   complete   ANTERIOR CERVICAL DECOMP/DISCECTOMY FUSION  02/18/2010   C6-7   BREAST BIOPSY Right    DILATION AND CURETTAGE OF UTERUS     2000   FOOT SURGERY Right 2015   KNEE ARTHROPLASTY     ROTATOR CUFF REPAIR Left 2013   TOTAL KNEE ARTHROPLASTY  09/07/2009   left   TOTAL KNEE ARTHROPLASTY  10/13/2008   right   TOTAL SHOULDER ARTHROPLASTY     UMBILICAL HERNIA REPAIR  03/28/2002   [facility]     WRIST SURGERY  2020    Current Medications: Outpatient Medications Prior to Visit  Medication Sig Dispense Refill   acidophilus (RISAQUAD) CAPS Take 1 capsule by mouth every morning.      albuterol (VENTOLIN HFA) 108 (90 Base) MCG/ACT inhaler 2 puff q 4-6 hours prn wheezes     ALPRAZolam (XANAX) 0.5 MG tablet Take 0.5 mg by mouth 2 (two) times daily as needed.     amoxicillin (AMOXIL) 500 MG capsule Take 2,000 mg by mouth as needed. Prior to dental work     aspirin 81 MG tablet Take 81 mg by mouth daily.     atorvastatin (LIPITOR) 20 MG tablet Take 20 mg by mouth every evening.      Azelastine HCl 0.15 % SOLN      baclofen (LIORESAL) 10 MG tablet Take 10-20 mg by mouth See admin instructions. Weekly as needed     calcium carbonate (TUMS E-X 750) 750 MG chewable tablet      chlorproMAZINE (THORAZINE) 25 MG tablet Take 25-50 mg by mouth See admin instructions. every 3 hours as needed max twice month  1   diclofenac (VOLTAREN) 50 MG EC tablet Take 50 mg by mouth as needed.     diclofenac sodium (VOLTAREN) 1 % GEL Apply three grams to three large joints up to three times daily 3 Tube 3   DULoxetine (CYMBALTA) 60 MG capsule  Take 60 mg by mouth daily.   3   ferrous sulfate 325 (65 FE) MG tablet Take 325 mg by mouth 2 (two) times a week.  furosemide (LASIX) 40 MG tablet TAKE 1 TABLET BY MOUTH DAILY (Patient taking differently: 20 mg as needed.) 90 tablet 3   HYDROcodone-acetaminophen (NORCO/VICODIN) 5-325 MG per tablet Take 1 tablet by mouth as needed for moderate pain.      hydrOXYzine (ATARAX/VISTARIL) 25 MG tablet Take 25 mg by mouth as directed.     irbesartan (AVAPRO) 75 MG tablet Take 37.5 mg by mouth. Take only if blood pressure above 130     loratadine (CLARITIN) 10 MG tablet Take 10 mg by mouth daily.     metFORMIN (GLUCOPHAGE) 500 MG tablet Take 500 mg by mouth daily with breakfast.     methocarbamol (ROBAXIN) 750 MG tablet Take 750 mg by mouth daily.     Multiple Vitamin (MULITIVITAMIN WITH MINERALS) TABS Take 1 tablet by mouth daily.     mupirocin nasal ointment (BACTROBAN) 2 % Place 1 application into the nose 2 (two) times daily. Use one-half of tube in each . After application, press sides of nose together and gently massage. As Needed     NON FORMULARY at bedtime. CPAP     Olopatadine HCl (PATADAY OP) Place 1 drop into both eyes as needed.      ONE TOUCH ULTRA TEST test strip      ONETOUCH DELICA LANCETS 60A MISC      pantoprazole (PROTONIX) 40 MG tablet Take 40 mg by mouth daily.     potassium chloride SA (K-DUR,KLOR-CON) 20 MEQ tablet Take 40 mEq by mouth 2 (two) times daily.   3   Secukinumab, 300 MG Dose, (COSENTYX SENSOREADY, 300 MG,) 150 MG/ML SOAJ Inject 150 mg into the skin every 14 (fourteen) days. 6 mL 0   topiramate (TOPAMAX) 100 MG tablet Take 200 mg by mouth at bedtime. Takes 2 tabs daily at bedtime TOTAL 400MG     vitamin B-12 (CYANOCOBALAMIN) 500 MCG tablet Take 500 mcg by mouth daily as needed.      No facility-administered medications prior to visit.     Allergies:   Patient has no known allergies.   Social History   Socioeconomic History   Marital status: Married     Spouse name: John   Number of children: 5   Years of education: some college   Highest education level: Not on file  Occupational History   Occupation: Disabled  Tobacco Use   Smoking status: Former    Packs/day: 0.50    Years: 20.00    Pack years: 10.00    Types: Cigarettes    Quit date: 10/02/1999    Years since quitting: 21.4   Smokeless tobacco: Never  Vaping Use   Vaping Use: Never used  Substance and Sexual Activity   Alcohol use: No   Drug use: No   Sexual activity: Yes    Birth control/protection: None  Other Topics Concern   Not on file  Social History Narrative   Lives with husband, John   Caffeine use: 1 cup coffee every morning   Right handed    Social Determinants of Health   Financial Resource Strain: Not on file  Food Insecurity: Not on file  Transportation Needs: Not on file  Physical Activity: Not on file  Stress: Not on file  Social Connections: Not on file     Family History:  The patient's family history includes Breast cancer in her sister and sister; Cancer in her maternal grandmother and paternal grandfather; Coronary artery disease in her sister; Crohn's disease in her cousin, cousin, maternal aunt,  and paternal aunt; Diabetes in her father, maternal grandmother, mother, paternal grandfather, sister, and sister; Hypertension in her father, maternal grandmother, and mother; Kidney disease in her father.   ROS General: Negative; No fevers, chills, or night sweats; positive for morbid obesity HEENT: Negative; No changes in vision or hearing, sinus congestion, difficulty swallowing Pulmonary: Negative; No cough, wheezing, shortness of breath, hemoptysis Cardiovascular: History of hypertension, hyperlipidemia GI: Positive for GERD GU: Negative; No dysuria, hematuria, or difficulty voiding Musculoskeletal: Negative; no myalgias, joint pain, or weakness Hematologic/Oncology: Negative; no easy bruising, bleeding Endocrine: Positive for diabetes  mellitus Neuro: Remote TIA Skin: Negative; No rashes or skin lesions Psychiatric: Negative; No behavioral problems, depression Sleep: See HPI  Other comprehensive 14 point system review is negative.   PHYSICAL EXAM:   VS:  BP (!) 150/82   Pulse 92   Ht '5\' 2"'  (1.575 m)   Wt 273 lb 12.8 oz (124.2 kg)   SpO2 97%   BMI 50.08 kg/m    Repeat blood pressure by me was 132/80   Wt Readings from Last 3 Encounters:  03/16/21 273 lb 12.8 oz (124.2 kg)  01/20/21 272 lb (123.4 kg)  11/13/20 272 lb 9.6 oz (123.7 kg)     General: Alert, oriented, no distress.  Morbid obesity Skin: normal turgor, no rashes, warm and dry HEENT: Normocephalic, atraumatic. Pupils equal round and reactive to light; sclera anicteric; extraocular muscles intact;  Nose without nasal septal hypertrophy Mouth/Parynx benign; Mallinpatti scale Neck: Thick neck;no JVD, no carotid bruits; normal carotid upstroke Lungs: clear to ausculatation and percussion; no wheezing or rales Chest wall: without tenderness to palpitation Heart: PMI not displaced, RRR, s1 s2 normal, 1/6 systolic murmur, no diastolic murmur, no rubs, gallops, thrills, or heaves Abdomen: soft, nontender; no hepatosplenomehaly, BS+; abdominal aorta nontender and not dilated by palpation. Back: no CVA tenderness Pulses 2+ Musculoskeletal: full range of motion, normal strength, no joint deformities Extremities: no clubbing cyanosis or edema, Homan's sign negative  Neurologic: grossly nonfocal; Cranial nerves grossly wnl Psychologic: Normal mood and affect   Studies/Labs Reviewed:   ECG (independently read by me):  NSR at 92; no ectopy  March 11/2020 ECG (independently read by me): Normal sinus rhythm at 89, no ectopy, normal intervals  Recent Labs: BMP Latest Ref Rng & Units 01/20/2021 11/02/2020 08/10/2020  Glucose 65 - 99 mg/dL 97 84 79  BUN 7 - 25 mg/dL '13 10 14  ' Creatinine 0.50 - 1.05 mg/dL 0.92 0.82 0.77  BUN/Creat Ratio 6 - 22 (calc) NOT  APPLICABLE NOT APPLICABLE NOT APPLICABLE  Sodium 021 - 146 mmol/L 141 142 139  Potassium 3.5 - 5.3 mmol/L 5.1 4.9 4.8  Chloride 98 - 110 mmol/L 110 111(H) 108  CO2 20 - 32 mmol/L '24 24 24  ' Calcium 8.6 - 10.4 mg/dL 9.4 9.8 9.7     Hepatic Function Latest Ref Rng & Units 01/20/2021 11/02/2020 08/10/2020  Total Protein 6.1 - 8.1 g/dL 7.2 6.9 7.4  Albumin 3.5 - 5.0 g/dL - - -  AST 10 - 35 U/L '13 14 14  ' ALT 6 - 29 U/L '16 12 15  ' Alk Phosphatase 38 - 126 U/L - - -  Total Bilirubin 0.2 - 1.2 mg/dL 0.3 0.3 0.4    CBC Latest Ref Rng & Units 01/20/2021 11/02/2020 08/10/2020  WBC 3.8 - 10.8 Thousand/uL 8.1 8.0 8.0  Hemoglobin 11.7 - 15.5 g/dL 12.1 12.6 12.6  Hematocrit 35.0 - 45.0 % 38.7 38.5 39.8  Platelets 140 - 400 Thousand/uL  223 233 258   Lab Results  Component Value Date   MCV 84.9 01/20/2021   MCV 84.6 11/02/2020   MCV 83.3 08/10/2020   Lab Results  Component Value Date   TSH 0.95 10/22/2015   No results found for: HGBA1C   BNP    Component Value Date/Time   BNP 102.6 (H) 06/24/2015 0902    ProBNP No results found for: PROBNP   Lipid Panel     Component Value Date/Time   CHOL 143 03/12/2013 0200   TRIG 128 03/12/2013 0200   HDL 42 03/12/2013 0200   CHOLHDL 3.4 03/12/2013 0200   VLDL 26 03/12/2013 0200   LDLCALC 75 03/12/2013 0200     RADIOLOGY: No results found.   Additional studies/ records that were reviewed today include:  I reviewed the patient's original sleep study which was done at the [facility] in April 2012 which showed an AHI of 8.9/h.  Events were worse with supine sleep.  Oxygen nadir was 81% with non-REM sleep and 80% with REM sleep.  I obtained a new download today with her new CPAP air sense 11 AutoSet machine.   ASSESSMENT:    1. OSA (obstructive sleep apnea)   2. Essential hypertension   3. Morbid obesity ([place])   4. Diabetes mellitus type 2 in obese ([place])   5. Hyperlipidemia with target LDL less than 70      PLAN:  Ms. Dazani Norby is a 60 year old female who has a history of morbid obesity, chronic diastolic heart failure, hypertension, hyperlipidemia, diabetes mellitus, remote TIA, and obstructive sleep apnea diagnosed in 11-22-10.  She was started on CPAP therapy in November 22, 2010 and received a new machine in 11/22/13 after her first machine malfunction.  She has been using her ResMed air sense 10 CPAP auto machine for the past 6-1/2 years with excellent compliance and cannot sleep without it. A download from October 13, 2020 through November 11, 2020 confirmed excellent compliance with average use at 10 hours and 4 minutes per night.  At 13 cm set pressure, AHI 0.9 cm of water.  There are some nights with occasional mask leak.  She received a message on her machine stating that motor failure is imminent.  [employer] [facility] [employer] is her DME company.  She continues to have issues with insomnia since her father's death in 22-Nov-2005.  With her excellent compliance, she qualified to receive a new machine but ultimately she was able to obtain on Feb 09, 2021.  She now has the new ResMed air sense 11 AutoSet unit.  She has climate line air 11 tubing.  She is currently set at a minimum pressure of 12 and maximum of 18.  She has been using a full facemask.  I obtained a new download in the office today.  Compliance is excellent with average use at 8 hours and 44 minutes per night.  AHI is excellent at 1.0 with a 95th percentile pressure at 15.1 and maximum average pressure at 16.9.  She is unaware of breakthrough snoring.  Her initial blood pressure today was elevated repeat value was increased to 132/80.  She is on irbesartan at very low dose which he takes only if her blood pressure is above 130, furosemide 40 mg daily.  She is on atorvastatin 20 mg.  She has been on Cymbalta to aid with depression.  She is diabetic on metformin.  We discussed the importance of weight loss and increased exercise.  She will return to  the care of Dr. Sallyanne Kuster  who is her primary cardiologist and Dr. Shon Baton who is her primary care physician.  I will see her in 1 year for reevaluation.   Medication Adjustments/Labs and Tests Ordered: Current medicines are reviewed at length with the patient today.  Concerns regarding medicines are outlined above.  Medication changes, Labs and Tests ordered today are listed in the Patient Instructions below. Patient Instructions  Medication Instructions:  No Changes  *If you need a refill on your cardiac medications before your next appointment, please call your pharmacy*   Lab Work: No Labs If you have labs (blood work) drawn today and your tests are completely normal, you will receive your results only by: [place] (if you have MyChart) OR A paper copy in the mail If you have any lab test that is abnormal or we need to change your treatment, we will call you to review the results.   Testing/Procedures: No Testing   Follow-Up: At [facility], you and your health needs are our priority.  As part of our continuing mission to provide you with exceptional heart care, we have created designated Provider Care Teams.  These Care Teams include your primary Cardiologist (physician) and Advanced Practice Providers (APPs -  Physician Assistants and Nurse Practitioners) who all work together to provide you with the care you need, when you need it.  We recommend signing up for the patient portal called "MyChart".  Sign up information is provided on this After Visit Summary.  MyChart is used to connect with patients for Virtual Visits (Telemedicine).  Patients are able to view lab/test results, encounter notes, upcoming appointments, etc.  Non-urgent messages can be sent to your provider as well.   To learn more about what you can do with MyChart, go to [email or web address].    Your next appointment:   1 year(s)  The format for your next appointment:   In Person  Provider:   Shelva Majestic, MD        Signed, Shelva Majestic, MD  03/19/2021 8:33 AM    [facility] [address] [place], [place], [place]  [address] Phone: [phone]

## 2021-03-23 ENCOUNTER — Telehealth: Payer: Self-pay | Admitting: Rheumatology

## 2021-03-23 NOTE — Telephone Encounter (Signed)
Patient advised of the following.    If you test POSITIVE for COVID19 and have MILD to MODERATE symptoms: First, call your PCP if you would like to receive COVID19 treatment AND Hold your medications during the infection and for at least 1 week after your symptoms have resolved: Injectable medication (Benlysta, Cimzia, Cosentyx, Enbrel, Humira, Orencia, Remicade, Simponi, Stelara, Taltz, Tremfya) Methotrexate Leflunomide (Arava) Mycophenolate (Cellcept) Morrie Sheldon, Olumiant, or Rinvoq If you take Actemra or Kevzara, you DO NOT need to hold these for COVID19 infection.  Patient advised to hold her Cosentyx for 1 week after symptoms resolve.

## 2021-03-23 NOTE — Telephone Encounter (Signed)
Patient  calling due to testing positive for COVID on 03/18/2021. Patient was given an antiviral medication Molnupiravir 200mg  (EUA) to take for 5 days, and finished that. Patient did retest, and tested negative today. Patient is due for Cosentyx on Monday, 03/29/2021. Patient would like to know if she is okay to proceed with next dose of Cosentyx? Please call to advise.

## 2021-03-23 NOTE — Telephone Encounter (Signed)
Attempted to contact the patient and left message for patient to call the office.  

## 2021-04-11 DIAGNOSIS — G4733 Obstructive sleep apnea (adult) (pediatric): Secondary | ICD-10-CM | POA: Diagnosis not present

## 2021-04-15 DIAGNOSIS — E119 Type 2 diabetes mellitus without complications: Secondary | ICD-10-CM | POA: Diagnosis not present

## 2021-04-15 DIAGNOSIS — I129 Hypertensive chronic kidney disease with stage 1 through stage 4 chronic kidney disease, or unspecified chronic kidney disease: Secondary | ICD-10-CM | POA: Diagnosis not present

## 2021-04-15 DIAGNOSIS — G894 Chronic pain syndrome: Secondary | ICD-10-CM | POA: Diagnosis not present

## 2021-04-15 DIAGNOSIS — N182 Chronic kidney disease, stage 2 (mild): Secondary | ICD-10-CM | POA: Diagnosis not present

## 2021-04-15 DIAGNOSIS — M461 Sacroiliitis, not elsewhere classified: Secondary | ICD-10-CM | POA: Diagnosis not present

## 2021-04-15 DIAGNOSIS — R69 Illness, unspecified: Secondary | ICD-10-CM | POA: Diagnosis not present

## 2021-04-15 DIAGNOSIS — D8989 Other specified disorders involving the immune mechanism, not elsewhere classified: Secondary | ICD-10-CM | POA: Diagnosis not present

## 2021-04-15 DIAGNOSIS — Z1589 Genetic susceptibility to other disease: Secondary | ICD-10-CM | POA: Diagnosis not present

## 2021-04-16 ENCOUNTER — Ambulatory Visit: Payer: Medicare HMO | Admitting: Physician Assistant

## 2021-05-04 ENCOUNTER — Other Ambulatory Visit: Payer: Self-pay | Admitting: *Deleted

## 2021-05-04 DIAGNOSIS — Z79899 Other long term (current) drug therapy: Secondary | ICD-10-CM | POA: Diagnosis not present

## 2021-05-04 LAB — CBC WITH DIFFERENTIAL/PLATELET
Absolute Monocytes: 664 cells/uL (ref 200–950)
Basophils Absolute: 33 cells/uL (ref 0–200)
Basophils Relative: 0.4 %
Eosinophils Absolute: 230 cells/uL (ref 15–500)
Eosinophils Relative: 2.8 %
HCT: 37.4 % (ref 35.0–45.0)
Hemoglobin: 12.1 g/dL (ref 11.7–15.5)
Lymphs Abs: 2042 cells/uL (ref 850–3900)
MCH: 26.9 pg — ABNORMAL LOW (ref 27.0–33.0)
MCHC: 32.4 g/dL (ref 32.0–36.0)
MCV: 83.3 fL (ref 80.0–100.0)
MPV: 9.6 fL (ref 7.5–12.5)
Monocytes Relative: 8.1 %
Neutro Abs: 5232 cells/uL (ref 1500–7800)
Neutrophils Relative %: 63.8 %
Platelets: 243 10*3/uL (ref 140–400)
RBC: 4.49 10*6/uL (ref 3.80–5.10)
RDW: 14.1 % (ref 11.0–15.0)
Total Lymphocyte: 24.9 %
WBC: 8.2 10*3/uL (ref 3.8–10.8)

## 2021-05-04 LAB — COMPLETE METABOLIC PANEL WITH GFR
AG Ratio: 1.5 (calc) (ref 1.0–2.5)
ALT: 15 U/L (ref 6–29)
AST: 14 U/L (ref 10–35)
Albumin: 4.3 g/dL (ref 3.6–5.1)
Alkaline phosphatase (APISO): 91 U/L (ref 37–153)
BUN: 12 mg/dL (ref 7–25)
CO2: 24 mmol/L (ref 20–32)
Calcium: 9.9 mg/dL (ref 8.6–10.4)
Chloride: 109 mmol/L (ref 98–110)
Creat: 0.91 mg/dL (ref 0.50–1.03)
Globulin: 2.8 g/dL (calc) (ref 1.9–3.7)
Glucose, Bld: 93 mg/dL (ref 65–99)
Potassium: 4.8 mmol/L (ref 3.5–5.3)
Sodium: 139 mmol/L (ref 135–146)
Total Bilirubin: 0.3 mg/dL (ref 0.2–1.2)
Total Protein: 7.1 g/dL (ref 6.1–8.1)
eGFR: 73 mL/min/{1.73_m2} (ref >=60)

## 2021-05-12 DIAGNOSIS — G4733 Obstructive sleep apnea (adult) (pediatric): Secondary | ICD-10-CM | POA: Diagnosis not present

## 2021-05-25 ENCOUNTER — Other Ambulatory Visit: Payer: Self-pay | Admitting: Internal Medicine

## 2021-05-25 DIAGNOSIS — Z1231 Encounter for screening mammogram for malignant neoplasm of breast: Secondary | ICD-10-CM

## 2021-05-26 ENCOUNTER — Other Ambulatory Visit: Payer: Self-pay

## 2021-05-26 ENCOUNTER — Ambulatory Visit
Admission: RE | Admit: 2021-05-26 | Discharge: 2021-05-26 | Disposition: A | Discharge status: Home / Self Care | Payer: Medicare HMO | Source: Ambulatory Visit | Attending: Internal Medicine | Admitting: Internal Medicine

## 2021-05-26 DIAGNOSIS — Z1231 Encounter for screening mammogram for malignant neoplasm of breast: Secondary | ICD-10-CM

## 2021-06-01 DIAGNOSIS — G43709 Chronic migraine without aura, not intractable, without status migrainosus: Secondary | ICD-10-CM | POA: Diagnosis not present

## 2021-06-08 NOTE — Progress Notes (Signed)
Office Visit Note  Patient: [name]             Date of Birth: [birth year]           MRN: [id number]             PCP: Shon Baton, MD Referring: Shon Baton, MD Visit Date: 06/22/2021 Occupation: '@GUAROCC' @  Subjective:  Pain in multiple joints.   History of Present Illness: [name] is a 60 y.o. female with a history of a spondyloarthropathy.  She has been on Cosentyx since August 2020.  She states for the last few months she has been experiencing skin rash after taking Cosentyx injections.  She states Dr. Virgina Jock gave her prednisone taper once.  She states recently she developedrash on her extremities after taking the Cosentyx shot.  She states she had been taking Zyrtec 2 days prior and 2 days after the injection and also has been taking Benadryl without much help.  She has been experiencing some tenderness over the medial epicondyle region.  She also describes discomfort in her SI joints.  She is off-and-on discomfort in her left trochanteric bursa.  Overall her symptoms improved since she has been on Cosentyx.  Her last Cosentyx shot was 2 weeks ago.    Activities of Daily Living:  Patient reports morning stiffness for 15-20 minutes.   Patient Reports nocturnal pain.  Difficulty dressing/grooming: Denies Difficulty climbing stairs: Denies Difficulty getting out of chair: Denies Difficulty using hands for taps, buttons, cutlery, and/or writing: Reports  Review of Systems  Constitutional:  Negative for fatigue.  HENT:  Positive for mouth dryness. Negative for mouth sores and nose dryness.   Eyes:  Negative for pain, itching and dryness.  Respiratory:  Negative for shortness of breath and difficulty breathing.   Cardiovascular:  Negative for chest pain and palpitations.  Gastrointestinal:  Positive for constipation. Negative for blood in stool and diarrhea.  Endocrine: Negative for increased urination.  Genitourinary:  Negative for difficulty urinating.   Musculoskeletal:  Positive for joint pain, joint pain, myalgias, morning stiffness, muscle tenderness and myalgias. Negative for joint swelling.  Skin:  Negative for color change, rash, redness and sensitivity to sunlight.  Allergic/Immunologic: Negative for susceptible to infections.  Neurological:  Positive for numbness and headaches. Negative for dizziness, memory loss and weakness.  Hematological:  Positive for bruising/bleeding tendency. Negative for swollen glands.  Psychiatric/Behavioral:  Positive for sleep disturbance. Negative for depressed mood and confusion. The patient is not nervous/anxious.    PMFS History:  Patient Active Problem List   Diagnosis Date Noted   Spondyloarthropathy 07/26/2016    Priority: 1.   High risk medication use 07/26/2016    Priority: 1.   Chronic low back pain 07/26/2016    Priority: 2.   H/O total knee replacement, bilateral 07/26/2016    Priority: 2.   DJD (degenerative joint disease), cervical     Priority: 2.   Chronic diastolic heart failure ([place]) 11/29/2015    Priority: 3.   Lung nodule seen on imaging study, CTA of chest, 6 mm nodule, followed by Dr. Virgina Jock 03/25/2013    Priority: 3.   Super obese 03/12/2013    Priority: 3.   Hypercholesterolemia     Priority: 3.   OSA (obstructive sleep apnea)     Priority: 3.   Essential hypertension     Priority: 3.   History of TIA (transient ischemic attack) 9/13     Priority: 3.   De Quervain's disease (  radial styloid tenosynovitis) 11/27/2018   Vertigo 10/18/2018   Common migraine with intractable migraine 07/06/2018   Diabetes mellitus type 2 in obese ([place]) 06/29/2018   Dyspnea 06/03/2015   Family history of coronary artery disease 03/12/2013   Chest pain with low risk of acute coronary syndrome - most likely musculoskeletal, negative nuc study 03/11/2013   Full thickness rotator cuff tear 05/15/2012   Partial tear of rotator cuff(726.13)    Shoulder impingement 04/12/2012    Past  Medical History:  Diagnosis Date   Anemia    takes iron supplement   Anxiety    Asthma    daily and prn inhalers   Bone spur    Left foot   Chest pain    a. 10/2015: NST showing possible mild ischemia but most consistent with breast attenuation. Low-risk study.    Chronic diastolic CHF (congestive heart failure) (HCC)    Common migraine with intractable migraine 07/06/2018   DDD (degenerative disc disease), cervical    Degenerative disc disease    Depression    Diabetes mellitus ([place])    Essential hypertension    Family history of early CAD    Former tobacco use    GERD (gastroesophageal reflux disease)    GERD (gastroesophageal reflux disease)    Headache(784.0)    migraine-like   History of TIA (transient ischemic attack) early 2013   no weakness or deficits   History of UTI    Hyperlipidemia    Insomnia    Migraines    Morbid obesity ([place])    Partial tear of rotator cuff(726.13)    Psoriatic arthritis ([place])    Shoulder impingement 04/2012   left   Sleep apnea    uses CPAP nightly    Family History  Problem Relation Age of Onset   Diabetes Mother    Hypertension Mother    Kidney disease Father    Hypertension Father    Diabetes Father    Breast cancer Sister    Diabetes Sister    Breast cancer Sister    Cancer Maternal Grandmother    Diabetes Maternal Grandmother    Hypertension Maternal Grandmother    Diabetes Paternal Grandfather    Cancer Paternal Grandfather    Coronary artery disease Sister    Diabetes Sister    Crohn's disease Maternal Aunt    Crohn's disease Cousin    Crohn's disease Cousin    Crohn's disease Paternal Aunt    Past Surgical History:  Procedure Laterality Date   ABDOMINAL HYSTERECTOMY  03/28/2002   complete   ANTERIOR CERVICAL DECOMP/DISCECTOMY FUSION  02/18/2010   C6-7   BREAST BIOPSY Right    DILATION AND CURETTAGE OF UTERUS     2000   FOOT SURGERY Right 2015   KNEE ARTHROPLASTY     ROTATOR CUFF REPAIR Left 2013   TOTAL  KNEE ARTHROPLASTY  09/07/2009   left   TOTAL KNEE ARTHROPLASTY  10/13/2008   right   TOTAL SHOULDER ARTHROPLASTY     UMBILICAL HERNIA REPAIR  03/28/2002   [facility]     WRIST SURGERY  2020   Social History   Social History Narrative   Lives with husband, [name]   Caffeine use: 1 cup coffee every morning   Right handed    Immunization History  Administered Date(s) Administered   Influenza-Unspecified 06/05/2018, 06/21/2021   PFIZER(Purple Top)SARS-COV-2 Vaccination 11/28/2019, 12/18/2019, 06/02/2020, 02/21/2021     Objective: Vital Signs: BP 129/84 (BP Location: Left Arm, Patient Position: Sitting, Cuff  Size: Large)   Pulse 87   Ht '5\' 2"'  (1.575 m)   Wt 270 lb 9.6 oz (122.7 kg)   BMI 49.49 kg/m    Physical Exam Vitals and nursing note reviewed.  Constitutional:      Appearance: She is well-developed.  HENT:     Head: Normocephalic and atraumatic.  Eyes:     Conjunctiva/sclera: Conjunctivae normal.  Cardiovascular:     Rate and Rhythm: Normal rate and regular rhythm.     Heart sounds: Normal heart sounds.  Pulmonary:     Effort: Pulmonary effort is normal.     Breath sounds: Normal breath sounds.  Abdominal:     General: Bowel sounds are normal.     Palpations: Abdomen is soft.  Musculoskeletal:     Cervical back: Normal range of motion.  Lymphadenopathy:     Cervical: No cervical adenopathy.  Skin:    General: Skin is warm and dry.     Capillary Refill: Capillary refill takes less than 2 seconds.  Neurological:     Mental Status: She is alert and oriented to person, place, and time.  Psychiatric:        Behavior: Behavior normal.     Musculoskeletal Exam: C-spine was in good range of motion.  She discomfort range of motion lumbar spine.  She had tenderness over bilateral SI joints.  Shoulder joints, elbow joints, wrist joints, MCPs PIPs and DIPs with good range of motion.  She has synovitis over her right second PIP joint.  Hip joints and knee joints with good range  of motion.  Bilateral knee joints are replaced and had some warmth on palpation.  There was no tenderness over ankles or MTPs.  There was no evidence of Achilles tendinitis or plantar fasciitis.  CDAI Exam: CDAI Score: 3  Patient Global: 5 mm; Provider Global: 5 mm Swollen: 1 ; Tender: 3  Joint Exam 06/22/2021      Right  Left  PIP 2  Swollen Tender     Sacroiliac   Tender   Tender     Investigation: No additional findings.  Imaging: MM 3D SCREEN BREAST BILATERAL  Result Date: 05/31/2021 CLINICAL DATA:  Screening. EXAM: DIGITAL SCREENING BILATERAL MAMMOGRAM WITH TOMOSYNTHESIS AND CAD TECHNIQUE: Bilateral screening digital craniocaudal and mediolateral oblique mammograms were obtained. Bilateral screening digital breast tomosynthesis was performed. The images were evaluated with computer-aided detection. COMPARISON:  Previous exam(s). ACR Breast Density Category c: The breast tissue is heterogeneously dense, which may obscure small masses. FINDINGS: There are no findings suspicious for malignancy. IMPRESSION: No mammographic evidence of malignancy. A result letter of this screening mammogram will be mailed directly to the patient. RECOMMENDATION: Screening mammogram in one year. (Code:SM-B-01Y) BI-RADS CATEGORY  1: Negative. Electronically Signed   By: Lajean Manes [name]   On: 05/31/2021 10:53    Recent Labs: Lab Results  Component Value Date   WBC 8.2 05/04/2021   HGB 12.1 05/04/2021   PLT 243 05/04/2021   NA 139 05/04/2021   K 4.8 05/04/2021   CL 109 05/04/2021   CO2 24 05/04/2021   GLUCOSE 93 05/04/2021   BUN 12 05/04/2021   CREATININE 0.91 05/04/2021   BILITOT 0.3 05/04/2021   ALKPHOS 119 06/27/2018   AST 14 05/04/2021   ALT 15 05/04/2021   PROT 7.1 05/04/2021   ALBUMIN 3.6 06/27/2018   CALCIUM 9.9 05/04/2021   GFRAA 79 01/20/2021   QFTBGOLDPLUS NEGATIVE 01/20/2021   February 2015 TB negative,   December 2015  hepatitis negative,   December 2010 chest x-ray and  normal,  February 2015 SPEP normal immunoglobulins normal, HIV negative   Speciality Comments: Patient recieves Cosentyx through [employer] 04/29/2019  Procedures:  No procedures performed Allergies: Patient has no known allergies.   Assessment / Plan:     Visit Diagnoses: Spondyloarthropathy - MRI positive for sacroiliitis, HLA-B27 negative, elevated ESR: She has been experiencing discomfort in the SI joints at this time for her to take her next Cosentyx injection.  She is also noticing some swelling in her right second PIP joint.  She had synovitis on examination.  She had done well on Cosentyx but recently she has been experiencing a rash on her extremities.  She had a prednisone taper earlier this year by her PCP.  She states she has been taking Zyrtec prior to and after Cosentyx dose.  I discussed possible use of Taltz.  Indications CONTRAINDICATIONS of Taltz were discussed.  Handout was given and consent was taken.  Our pharmacist worked on Loss adjuster, chartered.  After side effects were discussed patient wanted to proceed with Taltz.  She was given the first Taltz dose in the office today.  Medication counseling:  Baseline Immunosuppressant Therapy Labs TB GOLD Quantiferon TB Gold Latest Ref Rng & Units 01/20/2021  Quantiferon TB Gold Plus NEGATIVE NEGATIVE   Hepatitis Panel   HIV No results found for: HIV Immunoglobulins   SPEP Serum Protein Electrophoresis Latest Ref Rng & Units 05/04/2021  Total Protein 6.1 - 8.1 g/dL 7.1    Does patient have a history of inflammatory bowel disease? No  Counseled patient that Donnetta Hail is a IL-17 inhibitor that works to reduce pain and inflammation associated with arthritis.  Counseled patient on purpose, proper use, and adverse effects of Taltz. Reviewed the most common adverse effects of infection, inflammatory bowel disease, and allergic reaction. Counseled patient that Donnetta Hail should be held for infection and prior  to scheduled surgery.  Counseled patient to avoid live vaccines while on Taltz.  Advised patient to get annual influenza vaccine, pneumococcal vaccine, and Shingrix as indicated.  Reviewed storage information for Taltz.  Reviewed the importance of regular labs while on [place]. Standing orders placed and is to return in 1 month and then every 3 months after initiation.  Provided patient with medication education material and answered all questions.  Patient consented to [place].  Will upload consent into patient's chart.  Will apply for Taltz through patient's insurance and update when we receive a response.  Advised initial injection must be administered in office.  Patient voiced understanding.    Taltz dose will be: For psoriatic arthritis and plaque psoriasis overlap load of 160 mg then 80 mg on weeks 2,4,6,8,10,12 then 80 mg every 28 days  Prescription will be sent to pharmacy pending lab results and insurance approval.   High risk medication use - Cosentyx 150 mg sq injections every 14 days.  May 04, 2021 labs were normal.  TB gold was negative on Jan 20, 2021.  Sacroiliitis, not elsewhere classified (HCC)-she continues to have some discomfort in her SI joints.  Medial epicondylitis of both elbows -she complains of intermittent discomfort.  She had no tenderness on my palpation today.  DDD (degenerative disc disease), cervical-doing better.  Trochanteric bursitis, left hip - Resolved.  H/O total knee replacement, bilateral - According to the patient she had her knees replaced 10 to 11 years ago by Dr. Noemi Chapel.  Other medical problems are listed as follows:  Lung  nodule seen on imaging study, CTA of chest, 6 mm nodule, followed by Dr. Virgina Jock  History of diabetes mellitus  History of hypertension  Chronic diastolic heart failure ([place])  History of hyperlipidemia  History of TIA (transient ischemic attack)  History of sleep apnea  Orders: No orders of the defined types were placed  in this encounter.  No orders of the defined types were placed in this encounter.    Follow-Up Instructions: Return in about 6 weeks (around 08/03/2021) for Spondyloarthropathy.   Bo Merino, MD  Note - This record has been created using Editor, commissioning.  Chart creation errors have been sought, but may not always  have been located. Such creation errors do not reflect on  the standard of medical care.

## 2021-06-10 ENCOUNTER — Other Ambulatory Visit: Payer: Self-pay

## 2021-06-10 MED ORDER — COSENTYX SENSOREADY (300 MG) 150 MG/ML ~~LOC~~ SOAJ: 150.0000 mg | SUBCUTANEOUS | 0 refills | Status: DC

## 2021-06-10 NOTE — Telephone Encounter (Signed)
Patient called stating she received a message from [facility] that a new prescription is needed for her Cosentyx.  Patient states she took her last injection on Monday, 06/07/21.

## 2021-06-10 NOTE — Telephone Encounter (Signed)
Next Visit: 06/22/2021   Last Visit: 01/20/2021   Last Fill: 02/23/2021  KC:[id number]    Current Dose per office note 01/20/2021: Cosentyx 150 mg sq injections every 14 days   Labs: 05/04/2021 CBC and CMP WNL   TB Gold: 01/20/2021, negative    Okay to refill Cosentyx?

## 2021-06-11 ENCOUNTER — Telehealth: Payer: Self-pay | Admitting: Rheumatology

## 2021-06-11 DIAGNOSIS — G4733 Obstructive sleep apnea (adult) (pediatric): Secondary | ICD-10-CM | POA: Diagnosis not present

## 2021-06-11 NOTE — Telephone Encounter (Signed)
I called the patient to further review the symptoms she is experiencing.  According to the patient she injected Cosentyx on 06/07/2021.  She took Zyrtec 2 days prior to her injection, Benadryl on the day of, and Zyrtec 2 days after the injection.  She noticed 1 hive form on the inside of her left elbow and 1 on the upper left thigh first thing this morning.  No reaction at the injection site on the right side of her abdomen has been noted.  She applied white vinegar which has been helping with the itching.  She has not noticed any new hives or worsening of symptoms.  She is not experiencing any other signs or symptoms of an allergic reaction.   Discussed that since this has been a recurrent concern she should discontinue Cosentyx.  She was advised to avoid taking her dose on 06/21/2021.  She plans on calling to cancel her Cosentyx shipment today. She has an appointment with Dr. Estanislado Pandy on 06/22/2021 at which time other treatment options can be discussed in detail.  She voiced understanding.  All questions were addressed.

## 2021-06-11 NOTE — Telephone Encounter (Signed)
Patient took premed as prescribed: Zyrtec Saturday and Sunday. Monday pt did Benadryl with Cosentyx. Tues, Wed Zyrtec. Then this am patient started breaking out in hives again. Patient is broken out in hives on left side elbow, and upper thigh. Please call to advise.

## 2021-06-11 NOTE — Telephone Encounter (Signed)
Patient took premed as prescribed: Zyrtec Saturday and Sunday. Monday pt did Benadryl with Cosentyx. Tues, Wed Zyrtec. Then this am patient started breaking out in hives again. Patient is broken out in hives on left side elbow, left upper thigh and left hip. Patient she gave the injection in her right lower abdomen.  Please advise.

## 2021-06-18 DIAGNOSIS — G4733 Obstructive sleep apnea (adult) (pediatric): Secondary | ICD-10-CM | POA: Diagnosis not present

## 2021-06-21 DIAGNOSIS — Z23 Encounter for immunization: Secondary | ICD-10-CM | POA: Diagnosis not present

## 2021-06-22 ENCOUNTER — Ambulatory Visit: Payer: Medicare HMO | Admitting: Pharmacist

## 2021-06-22 ENCOUNTER — Encounter: Payer: Self-pay | Admitting: Rheumatology

## 2021-06-22 ENCOUNTER — Other Ambulatory Visit: Payer: Self-pay

## 2021-06-22 ENCOUNTER — Other Ambulatory Visit (HOSPITAL_COMMUNITY): Payer: Self-pay

## 2021-06-22 ENCOUNTER — Ambulatory Visit: Payer: Medicare HMO | Admitting: Rheumatology

## 2021-06-22 ENCOUNTER — Telehealth: Payer: Self-pay | Admitting: Pharmacist

## 2021-06-22 VITALS — BP 129/84 | HR 87 | Ht 62.0 in | Wt 270.6 lb

## 2021-06-22 DIAGNOSIS — Z8669 Personal history of other diseases of the nervous system and sense organs: Secondary | ICD-10-CM

## 2021-06-22 DIAGNOSIS — M7701 Medial epicondylitis, right elbow: Secondary | ICD-10-CM | POA: Diagnosis not present

## 2021-06-22 DIAGNOSIS — Z8679 Personal history of other diseases of the circulatory system: Secondary | ICD-10-CM | POA: Diagnosis not present

## 2021-06-22 DIAGNOSIS — M5030 Other cervical disc degeneration, unspecified cervical region: Secondary | ICD-10-CM

## 2021-06-22 DIAGNOSIS — M461 Sacroiliitis, not elsewhere classified: Secondary | ICD-10-CM

## 2021-06-22 DIAGNOSIS — Z96653 Presence of artificial knee joint, bilateral: Secondary | ICD-10-CM | POA: Diagnosis not present

## 2021-06-22 DIAGNOSIS — M62838 Other muscle spasm: Secondary | ICD-10-CM

## 2021-06-22 DIAGNOSIS — I5032 Chronic diastolic (congestive) heart failure: Secondary | ICD-10-CM

## 2021-06-22 DIAGNOSIS — R911 Solitary pulmonary nodule: Secondary | ICD-10-CM | POA: Diagnosis not present

## 2021-06-22 DIAGNOSIS — M7062 Trochanteric bursitis, left hip: Secondary | ICD-10-CM

## 2021-06-22 DIAGNOSIS — R252 Cramp and spasm: Secondary | ICD-10-CM

## 2021-06-22 DIAGNOSIS — Z79899 Other long term (current) drug therapy: Secondary | ICD-10-CM | POA: Diagnosis not present

## 2021-06-22 DIAGNOSIS — M7702 Medial epicondylitis, left elbow: Secondary | ICD-10-CM

## 2021-06-22 DIAGNOSIS — Z8639 Personal history of other endocrine, nutritional and metabolic disease: Secondary | ICD-10-CM | POA: Diagnosis not present

## 2021-06-22 DIAGNOSIS — Z8673 Personal history of transient ischemic attack (TIA), and cerebral infarction without residual deficits: Secondary | ICD-10-CM

## 2021-06-22 DIAGNOSIS — M47819 Spondylosis without myelopathy or radiculopathy, site unspecified: Secondary | ICD-10-CM

## 2021-06-22 MED ORDER — TALTZ 80 MG/ML ~~LOC~~ SOAJ: SUBCUTANEOUS | 0 refills | Status: DC

## 2021-06-22 NOTE — Telephone Encounter (Signed)
Received Approval for Taltz through 09/11/21.  Ran test claim for 2 pens for 28 days- patient's copay is $2911.16. Patient will need to apply for [employer] PAP.

## 2021-06-22 NOTE — Patient Instructions (Signed)
Your next TALTZ dose is due on 11/8, 12/6, and every 4 weeks thereafter   HOLD TALTZ if you have signs or symptoms of an infection. You can resume once you feel better or back to your baseline. HOLD TALTZ if you start antibiotics to treat an infection. HOLD TALTZ around the time of surgery/procedures. Your surgeon will be able to provide recommendations on when to hold BEFORE and when you are cleared to [place].  Please complete and return the [employer] application along with income documents AS SOON AS POSSIBLE   Labs are due in 1 month then every 3 months. Lab hours are from Monday to Thursday 1:30-4:30pm and Friday 1:30-4pm. You do not need an appointment if you come for labs during these times.  How to manage an injection site reaction: Remember the 5 C's: COUNTER - leave on the counter at least 30 minutes but up to overnight to bring medication to room temperature. This may help prevent stinging COLD - place something cold (like an ice gel pack or cold water bottle) on the injection site just before cleansing with alcohol. This may help reduce pain CLARITIN - use Claritin (generic name is loratadine) for the first two weeks of treatment or the day of, the day before, and the day after injecting. This will help to minimize injection site reactions CORTISONE CREAM - apply if injection site is irritated and itching CALL ME - if injection site reaction is bigger than the size of your fist, looks infected, blisters, or if you develop hives

## 2021-06-22 NOTE — Progress Notes (Addendum)
Pharmacy Note  Subjective:   Patient presents to clinic today to receive first dose of Taltz for spondyarthritis. Cosentyx dose was due yesterday, 06/21/21 and held dose so she is cleared to start Donnetta Hail today  Patient running a fever or have signs/symptoms of infection? No  Patient currently on antibiotics for the treatment of infection? No  Patient have any upcoming invasive procedures/surgeries? No  Objective: CMP     Component Value Date/Time   NA 139 05/04/2021 1137   NA 139 05/31/2016 0000   K 4.8 05/04/2021 1137   CL 109 05/04/2021 1137   CO2 24 05/04/2021 1137   GLUCOSE 93 05/04/2021 1137   BUN 12 05/04/2021 1137   BUN 18 05/31/2016 0000   CREATININE 0.91 05/04/2021 1137   CALCIUM 9.9 05/04/2021 1137   PROT 7.1 05/04/2021 1137   ALBUMIN 3.6 06/27/2018 1212   AST 14 05/04/2021 1137   ALT 15 05/04/2021 1137   ALKPHOS 119 06/27/2018 1212   BILITOT 0.3 05/04/2021 1137   GFRNONAA 68 01/20/2021 0933   GFRAA 79 01/20/2021 0933    CBC    Component Value Date/Time   WBC 8.2 05/04/2021 1137   RBC 4.49 05/04/2021 1137   HGB 12.1 05/04/2021 1137   HCT 37.4 05/04/2021 1137   PLT 243 05/04/2021 1137   MCV 83.3 05/04/2021 1137   MCH 26.9 (L) 05/04/2021 1137   MCHC 32.4 05/04/2021 1137   RDW 14.1 05/04/2021 1137   LYMPHSABS 2,042 05/04/2021 1137   MONOABS 0.6 06/27/2018 1212   EOSABS 230 05/04/2021 1137   BASOSABS 33 05/04/2021 1137    Baseline Immunosuppressant Therapy Labs TB GOLD Quantiferon TB Gold Latest Ref Rng & Units 01/20/2021  Quantiferon TB Gold Plus NEGATIVE NEGATIVE   SPEP Serum Protein Electrophoresis Latest Ref Rng & Units 05/04/2021  Total Protein 6.1 - 8.1 g/dL 7.1   SPEP wnl 08/28/2014 Hepatitis panel on 08/27/14 - Hepatitis B surface antigen - negative - Hepatitis C antibody - negative - Hepatitis B core antibody, IgM - non reactive - Hepatitis A antibody, IgM - non reactive  HIV 1/2 Ag/Ab w/ reflex on 08/27/14 - non  reactive  Immunoglobulin quantitative on 08/27/14 - IgG - 1130 - IgA - 173 - IgM 151  Chest x-ray: 04/30/17 - negative chest  Assessment/Plan:  Demonstrated proper injection technique with Taltz demo device  Patient able to demonstrate proper injection technique using the teach back method.  Patient self injected in the right and left lower abdomen with:  Sample Medication: Taltz 80mg /mL x 2 autoinjectors Lot: [id number] Expiration: 11/08/2022  Patient tolerated well.  Observed for 30 mins in office for adverse reaction and none noted.   Patient is to return in 1 month for labs and 6-8 weeks for follow-up appointment.  Standing orders for CBC w diff and CMP w GFR.   Taltz approved through insurance .   Awaiting approval through [facility] patient assistance. Patient provided with application to complete and bring back to clinic with income documents  Her dose will be Taltz 160mg  (received today in clinic), and 80mg  every 4 weeks thereafter  All questions encouraged and answered.  Instructed patient to call with any further questions or concerns.  Knox Saliva, PharmD, MPH, BCPS Clinical Pharmacist (Rheumatology and Pulmonology)  06/22/2021 8:59 AM

## 2021-06-22 NOTE — Progress Notes (Signed)
Pharmacy Note  Subjective:  Patient presents today to [facility] for follow up office visit.  Patient was seen by the pharmacist for counseling on Max for ankylosing spondylitis..  Prior therapy includes:Cosentyx to which she had injection site reaciton.  History of inflammatory bowel disease: No  Objective:  CBC    Component Value Date/Time   WBC 8.2 05/04/2021 1137   RBC 4.49 05/04/2021 1137   HGB 12.1 05/04/2021 1137   HCT 37.4 05/04/2021 1137   PLT 243 05/04/2021 1137   MCV 83.3 05/04/2021 1137   MCH 26.9 (L) 05/04/2021 1137   MCHC 32.4 05/04/2021 1137   RDW 14.1 05/04/2021 1137   LYMPHSABS 2,042 05/04/2021 1137   MONOABS 0.6 06/27/2018 1212   EOSABS 230 05/04/2021 1137   BASOSABS 33 05/04/2021 1137    CMP     Component Value Date/Time   NA 139 05/04/2021 1137   NA 139 05/31/2016 0000   K 4.8 05/04/2021 1137   CL 109 05/04/2021 1137   CO2 24 05/04/2021 1137   GLUCOSE 93 05/04/2021 1137   BUN 12 05/04/2021 1137   BUN 18 05/31/2016 0000   CREATININE 0.91 05/04/2021 1137   CALCIUM 9.9 05/04/2021 1137   PROT 7.1 05/04/2021 1137   ALBUMIN 3.6 06/27/2018 1212   AST 14 05/04/2021 1137   ALT 15 05/04/2021 1137   ALKPHOS 119 06/27/2018 1212   BILITOT 0.3 05/04/2021 1137   GFRNONAA 68 01/20/2021 0933   GFRAA 79 01/20/2021 0933    Baseline Immunosuppressant Therapy Labs  Quantiferon TB Gold Latest Ref Rng & Units 01/20/2021  Quantiferon TB Gold Plus NEGATIVE NEGATIVE   Per note from Dr. Estanislado Pandy on 07/29/16 -  December 2015 and hepatitis negative December 2010 chest x-ray and normal, February 2015 SPEP normal immunoglobulins normal, HIV negative  No results found for: HIV   Serum Protein Electrophoresis Latest Ref Rng & Units 05/04/2021  Total Protein 6.1 - 8.1 g/dL 7.1    Assessment/Plan:  Counseled patient that Donnetta Hail is a IL-17 inhibitor that works to reduce pain and inflammation associated with arthritis.  Counseled patient on purpose,  proper use, and adverse effects of Taltz. Reviewed the most common adverse effects of infection (more commonly nasopharyngitis, URTI), inflammatory bowel disease, and allergic reaction. Counseled patient that Donnetta Hail should be held for infection and prior to scheduled surgery.  Counseled patient to avoid live vaccines while on Taltz. Recommend annual influenza, PCV 15 or PCV20 or Pneumovax 23, and Shingrix as indicated. Reviewed storage information for Taltz.  Reviewed the importance of regular labs while on [place]. Will monitor CBC and CMP 1 month after starting and every 3 months routinely thereafter. Will monitor TB gold annually. Standing orders placed. Provided patient with medication education material and answered all questions.  Patient consented to [facility].  Will upload consent into patient's chart.  Will apply for Taltz through patient's insurance and update when we receive a response.  Advised initial injection must be administered in office.  Patient voiced understanding.    Taltz dose will be: For ankylosing spondylitis load of 160 mg then 80 mg every 28 days  Prescription will be sent to pharmacy pending lab results and insurance approval. Patient assistance application for [employer] completed today  Knox Saliva, PharmD, MPH, BCPS Clinical Pharmacist (Rheumatology and Pulmonology)

## 2021-06-22 NOTE — Telephone Encounter (Signed)
Received signed patient form for [employer] PAP application along with income documents. Taltz PAP application is now pending provider signature only  Knox Saliva, PharmD, MPH, BCPS Clinical Pharmacist (Rheumatology and Pulmonology)

## 2021-06-22 NOTE — Telephone Encounter (Signed)
Submitted a Prior Authorization request to [facility] for TALTZ via CoverMyMeds. Will update once we receive a response.  Key: [id number]  Patient provided with patient portion of [employer] PAP aplication and advised to complete and return to clinic with income documents. Provider portion placed in Dr. Arlean Hopping folder to be signed.  Knox Saliva, PharmD, MPH, BCPS Clinical Pharmacist (Rheumatology and Pulmonology)

## 2021-06-22 NOTE — Patient Instructions (Signed)
Ixekizumab injection What is this medication? IXEKIZUMAB (ix e KIZ ue mab) is used to treat plaque psoriasis, psoriatic arthritis, ankylosing spondylitis, and active non-radiographic axial spondyloarthritis. This medicine may be used for other purposes; ask your health care provider or pharmacist if you have questions. COMMON BRAND NAME(S): TALTZ What should I tell my care team before I take this medication? They need to know if you have any of these conditions: immune system problems infection (especially a viral infection such as chickenpox, cold sores, or herpes) recently received or are scheduled to receive a vaccine tuberculosis, a positive skin test for tuberculosis, or have recently been in close contact with someone who has tuberculosis an unusual or allergic reaction to ixekizumab, other medicines, foods, dyes or preservatives pregnant or trying to get pregnant breast-feeding How should I use this medication? This medicine is for injection under the skin. It may be administered by a healthcare professional in a hospital or clinic setting or at home. If you get this medicine at home, you will be taught how to prepare and give this medicine. Use exactly as directed. Take your medicine at regular intervals. Do not take your medicine more often than directed. It is important that you put your used needles and syringes in a special sharps container. Do not put them in a trash can. If you do not have a sharps container, call your pharmacist or healthcare provider to get one. A special MedGuide will be given to you by the pharmacist with each prescription and refill. Be sure to read this information carefully each time. Talk to your pediatrician regarding the use of this medicine in children. While this drug may be prescribed for children as young as 6 years for selected conditions, precautions do apply. Overdosage: If you think you have taken too much of this medicine contact a poison control  center or emergency room at once. NOTE: This medicine is only for you. Do not share this medicine with others. What if I miss a dose? It is important not to miss your dose. Call your doctor of health care professional if you are unable to keep an appointment. If you give yourself the medicine and you miss a dose, take it as soon as you can. Then be sure to take your next doses on your regular schedule. Do not take double or extra doses. If you have questions about a missed injection, call your health care professional. What may interact with this medication? Do not take this medicine with any of the following medications: live virus vaccines This medicine may also interact with the following medications: inactivated vaccines This list may not describe all possible interactions. Give your health care provider a list of all the medicines, herbs, non-prescription drugs, or dietary supplements you use. Also tell them if you smoke, drink alcohol, or use illegal drugs. Some items may interact with your medicine. What should I watch for while using this medication? Tell your doctor or healthcare professional if your symptoms do not start to get better or if they get worse. You will be tested for tuberculosis (TB) before you start this medicine. If your doctor prescribes any medicine for TB, you should start taking the TB medicine before starting this medicine. Make sure to finish the full course of TB medicine. Call your doctor or healthcare professional for advice if you get a fever, chills or sore throat, or other symptoms of a cold or flu. Do not treat yourself. This drug decreases your body's ability to  fight infections. Try to avoid being around people who are sick. This medicine can decrease the response to a vaccine. If you need to get vaccinated, tell your healthcare professional if you have received this medicine within the last 6 months. Extra booster doses may be needed. Talk to your doctor to see  if a different vaccination schedule is needed. What side effects may I notice from receiving this medication? Side effects that you should report to your doctor or health care professional as soon as possible: allergic reactions like skin rash, itching or hives, swelling of the face, lips, or tongue signs and symptoms of infection like fever or chills; cough; sore throat; pain or trouble passing urine signs and symptoms of bowel problems like abdominal pain, diarrhea, blood in the stool, and weight loss white patches in the mouth or throat vaginal discharge, itching, or odor in women Side effects that usually do not require medical attention (report to your doctor or health care professional if they continue or are bothersome): nausea runny nose sinus trouble This list may not describe all possible side effects. Call your doctor for medical advice about side effects. You may report side effects to FDA at 1-800-FDA-1088. Where should I keep my medication? Keep out of the reach of children. Store the prefilled syringe or injection pen in a refrigerator between 2 to 8 degrees C (36 to 46 degrees F). Keep the syringe or the pen in the original carton until ready for use. Protect from light. Do not freeze. Do not shake. Prior to use, remove the syringe or pen from the refrigerator and use within 30 minutes. Throw away any unused medicine after the expiration date on the label. NOTE: This sheet is a summary. It may not cover all possible information. If you have questions about this medicine, talk to your doctor, pharmacist, or health care provider.  2022 Elsevier/Gold Standard (2019-11-21 15:12:26)

## 2021-06-23 NOTE — Telephone Encounter (Signed)
Submitted Patient Assistance Application to [facility] for [place] along with provider portion, PA, signed patient forms, and income documents. Will update patient when we receive a response.  Fax# [phone] Phone# [phone]  Knox Saliva, PharmD, MPH, BCPS Clinical Pharmacist (Rheumatology and Pulmonology)

## 2021-06-23 NOTE — Telephone Encounter (Addendum)
Received a fax from  [facility] regarding an approval for [place] patient assistance from 06/23/21 to 09/11/21.   Phone number: [phone]  Called patient to notify. She was provided with pharmacy phone number and advised to call them directly if she does receive a call by next week. She verbalized understanding and expressed gratitude.  Patient requested that we keep her income documents in locked cabinet at pharmacy office for [phone] renewal application  Knox Saliva, PharmD, MPH, BCPS Clinical Pharmacist (Rheumatology and Pulmonology)

## 2021-07-12 DIAGNOSIS — G4733 Obstructive sleep apnea (adult) (pediatric): Secondary | ICD-10-CM | POA: Diagnosis not present

## 2021-07-14 ENCOUNTER — Telehealth: Payer: Self-pay | Admitting: Pharmacist

## 2021-07-14 NOTE — Telephone Encounter (Signed)
Received Taltz PAP re-enrollment application from [facility]   Will place provider portion in Dr. Arlean Hopping folder to be signed (along with med list, insurance card copy, PA approval letter and income documents that were submitted with recently approved application).   Mailed patient her portion today to be completed and returned to pharmacy team with letter noting that income docs are not needed since we have on hand     Knox Saliva, PharmD, MPH, BCPS Clinical Pharmacist (Rheumatology and Pulmonology)

## 2021-07-15 NOTE — Telephone Encounter (Signed)
Received signed provider form for [employer] PAP re-enrollment application for Taltz.  Will place signed provider portion, med list, insurance card copy, PA approval letter, and income documents in "PAP pending info" folder in pharmacy office  Awaiting signed patient portion which was mailed out to home yesterday, 07/14/21  Knox Saliva, PharmD, MPH, BCPS Clinical Pharmacist (Rheumatology and Pulmonology)

## 2021-07-19 NOTE — Progress Notes (Signed)
Office Visit Note  Patient: Leafy Motsinger             Date of Birth: 03/09/1961           MRN: [id number]             PCP: Shon Baton, MD Referring: Shon Baton, MD Visit Date: 08/02/2021 Occupation: '@GUAROCC' @  Subjective:  Medication monitoring   History of Present Illness: Evgenia Merriman is a 60 y.o. female with history of spondyloarthropathy.  Patient is on Taltz 80 mg subcutaneous injections every 28 days.  She was started on Taltz on 06/22/2021.  She has been tolerating Taltz without any side effects or injection site reactions.  She has not had any signs of hives since switching from Cosentyx to [employer].  She denies any recent infections.  Patient reports that she has noticed "a tremendous improvement in her joint pain" since starting on Taltz.  She states that she was recently evaluated by Dr. Delsa Bern for increased lower back pain after standing for prolonged periods of time.  She was experiencing frequent muscle spasms and was given a prescription for methocarbamol 750 2 tablets up to 3 times daily for muscle spasms.  Her discomfort has gradually started to improve so she plans on tapering off of methocarbamol.  She states that she is also been taking diclofenac 50 mg 1-2 tablets daily as needed for pain relief.  She has been performing back exercises daily which alleviate her tightness and stiffness.  She states the pain and inflammation in her hands has resolved since starting on taltz.        Activities of Daily Living:  Patient reports morning stiffness for 3 minutes.   Patient Denies nocturnal pain.  Difficulty dressing/grooming: Denies Difficulty climbing stairs: Reports Difficulty getting out of chair: Denies Difficulty using hands for taps, buttons, cutlery, and/or writing: Denies  Review of Systems  Constitutional:  Negative for fatigue.  HENT:  Negative for mouth sores, mouth dryness and nose dryness.   Eyes:  Negative for pain, visual disturbance and  dryness.  Respiratory:  Negative for cough, hemoptysis, shortness of breath and difficulty breathing.   Cardiovascular:  Negative for chest pain, palpitations, hypertension and swelling in legs/feet.  Gastrointestinal:  Positive for constipation. Negative for blood in stool and diarrhea.  Endocrine: Negative for increased urination.  Genitourinary:  Negative for difficulty urinating and painful urination.  Musculoskeletal:  Positive for morning stiffness. Negative for joint pain, joint pain, joint swelling, myalgias, muscle weakness, muscle tenderness and myalgias.  Skin:  Negative for color change, pallor, rash, hair loss, nodules/bumps, skin tightness, ulcers and sensitivity to sunlight.  Allergic/Immunologic: Negative for susceptible to infections.  Neurological:  Negative for dizziness, numbness, headaches and weakness.  Hematological:  Negative for bruising/bleeding tendency and swollen glands.  Psychiatric/Behavioral:  Positive for sleep disturbance. Negative for depressed mood. The patient is not nervous/anxious.    PMFS History:  Patient Active Problem List   Diagnosis Date Noted   Tennis Must Quervain's disease (radial styloid tenosynovitis) 11/27/2018   Vertigo 10/18/2018   Common migraine with intractable migraine 07/06/2018   Diabetes mellitus type 2 in obese ([place]) 06/29/2018   Spondyloarthropathy 07/26/2016   High risk medication use 07/26/2016   Chronic low back pain 07/26/2016   H/O total knee replacement, bilateral 07/26/2016   Chronic diastolic heart failure ([place]) 11/29/2015   Dyspnea 06/03/2015   Lung nodule seen on imaging study, CTA of chest, 6 mm nodule, followed by Dr. Virgina Jock 03/25/2013  Family history of coronary artery disease 03/12/2013   Super obese 03/12/2013   Chest pain with low risk of acute coronary syndrome - most likely musculoskeletal, negative nuc study 03/11/2013   Full thickness rotator cuff tear 05/15/2012   Hypercholesterolemia    DJD (degenerative joint  disease), cervical    OSA (obstructive sleep apnea)    Essential hypertension    History of TIA (transient ischemic attack) 9/13    Partial tear of rotator cuff(726.13)    Shoulder impingement 04/12/2012    Past Medical History:  Diagnosis Date   Anemia    takes iron supplement   Anxiety    Asthma    daily and prn inhalers   Bone spur    Left foot   Chest pain    a. 10/2015: NST showing possible mild ischemia but most consistent with breast attenuation. Low-risk study.    Chronic diastolic CHF (congestive heart failure) (HCC)    Common migraine with intractable migraine 07/06/2018   DDD (degenerative disc disease), cervical    Degenerative disc disease    Depression    Diabetes mellitus ([place])    Essential hypertension    Family history of early CAD    Former tobacco use    GERD (gastroesophageal reflux disease)    GERD (gastroesophageal reflux disease)    Headache(784.0)    migraine-like   History of TIA (transient ischemic attack) early 2013   no weakness or deficits   History of UTI    Hyperlipidemia    Insomnia    Migraines    Morbid obesity ([place])    Partial tear of rotator cuff(726.13)    Psoriatic arthritis ([place])    Shoulder impingement 04/2012   left   Sleep apnea    uses CPAP nightly    Family History  Problem Relation Age of Onset   Diabetes Mother    Hypertension Mother    Kidney disease Father    Hypertension Father    Diabetes Father    Breast cancer Sister    Diabetes Sister    Breast cancer Sister    Cancer Maternal Grandmother    Diabetes Maternal Grandmother    Hypertension Maternal Grandmother    Diabetes Paternal Grandfather    Cancer Paternal Grandfather    Coronary artery disease Sister    Diabetes Sister    Crohn's disease Maternal Aunt    Crohn's disease Cousin    Crohn's disease Cousin    Crohn's disease Paternal Aunt    Past Surgical History:  Procedure Laterality Date   ABDOMINAL HYSTERECTOMY  03/28/2002   complete    ANTERIOR CERVICAL DECOMP/DISCECTOMY FUSION  02/18/2010   C6-7   BREAST BIOPSY Right    DILATION AND CURETTAGE OF UTERUS     2000   FOOT SURGERY Right 2015   KNEE ARTHROPLASTY     ROTATOR CUFF REPAIR Left 2013   TOTAL KNEE ARTHROPLASTY  09/07/2009   left   TOTAL KNEE ARTHROPLASTY  10/13/2008   right   TOTAL SHOULDER ARTHROPLASTY     UMBILICAL HERNIA REPAIR  03/28/2002   [facility]     WRIST SURGERY  2020   Social History   Social History Narrative   Lives with husband, John   Caffeine use: 1 cup coffee every morning   Right handed    Immunization History  Administered Date(s) Administered   Influenza-Unspecified 06/05/2018, 06/21/2021   PFIZER(Purple Top)SARS-COV-2 Vaccination 11/28/2019, 12/18/2019, 06/02/2020, 02/21/2021     Objective: Vital Signs: BP 135/79 (BP  Location: Left Arm, Patient Position: Sitting, Cuff Size: Normal)   Pulse 96   Resp 16   Ht '5\' 2"'  (1.575 m)   Wt 271 lb 6.4 oz (123.1 kg)   BMI 49.64 kg/m    Physical Exam Vitals and nursing note reviewed.  Constitutional:      Appearance: She is well-developed.  HENT:     Head: Normocephalic and atraumatic.  Eyes:     Conjunctiva/sclera: Conjunctivae normal.  Pulmonary:     Effort: Pulmonary effort is normal.  Abdominal:     Palpations: Abdomen is soft.  Musculoskeletal:     Cervical back: Normal range of motion.  Skin:    General: Skin is warm and dry.     Capillary Refill: Capillary refill takes less than 2 seconds.  Neurological:     Mental Status: She is alert and oriented to person, place, and time.  Psychiatric:        Behavior: Behavior normal.     Musculoskeletal Exam: Spine, thoracic spine, lumbar spine have good range of motion with no discomfort.  No midline spinal tenderness or SI joint tenderness.  Shoulder joints, elbow joints, wrist joints, MCPs, PIPs, DIPs have good range of motion with no synovitis.  Complete fist formation bilaterally.  Hip joints have good range of motion with no  discomfort.  Knee joints have good range of motion with no warmth or effusion.  Ankle joints have good range of motion with no tenderness or joint swelling.  No evidence of Achilles tinnitus or plantar fasciitis.  No tenderness over MTP joints.  No dorsal spurs noted.  CDAI Exam: CDAI Score: -- Patient Global: --; Provider Global: -- Swollen: --; Tender: -- Joint Exam 08/02/2021   No joint exam has been documented for this visit   There is currently no information documented on the homunculus. Go to the Rheumatology activity and complete the homunculus joint exam.  Investigation: No additional findings.  Imaging: No results found.  Recent Labs: Lab Results  Component Value Date   WBC 10.1 07/28/2021   HGB 12.2 07/28/2021   PLT 254 07/28/2021   NA 140 07/28/2021   K 4.4 07/28/2021   CL 108 07/28/2021   CO2 24 07/28/2021   GLUCOSE 96 07/28/2021   BUN 14 07/28/2021   CREATININE 0.85 07/28/2021   BILITOT 0.3 07/28/2021   ALKPHOS 119 06/27/2018   AST 12 07/28/2021   ALT 13 07/28/2021   PROT 6.8 07/28/2021   ALBUMIN 3.6 06/27/2018   CALCIUM 9.3 07/28/2021   GFRAA 79 01/20/2021   QFTBGOLDPLUS NEGATIVE 01/20/2021    Speciality Comments: Patient recieves Cosentyx through [employer] 04/29/2019  Procedures:  No procedures performed Allergies: Patient has no known allergies.   Assessment / Plan:     Visit Diagnoses: Spondyloarthropathy - MRI positive for sacroiliitis, HLA-B27 negative, elevated ESR: She has no synovitis or dactylitis on examination today.  No midline spinal tenderness or SI joint tenderness was noted.  Her morning stiffness has been lasting less than 5 minutes and she has not had any nocturnal pain.  No evidence of Achilles tendinitis or plantar fasciitis.  She has not had any eye pain or inflammation recently.  She is clinically doing well on Taltz 80 mg subcutaneous injections every 28 days.  She initiated therapy on 06/22/2021.   She has noticed a tremendous improvement in her joint pain and inflammation since switching from Cosentyx to [employer].  She has been tolerating Taltz without any side effects or injection  site reactions.  She has been taking diclofenac 50 mg 1 tablet twice daily as needed for pain relief.  She also takes methocarbamol as needed for muscle spasms.  She will remain on the current treatment regimen.  She was advised to notify us if she develops increased joint pain or joint swelling.  She will follow-up in the office in 3 months.  High risk medication use - Taltz 80 mg subcutaneous injections every 28 days.  She initiated Taltz on 06/22/2021.  D/c Cosentyx-hives. CBC and CMP updated on 07/28/21.  She is due to update lab work in February and every 3 months.  Standing orders for CBC and CMP remain in place.  TB gold negative on 01/20/21.    She has not had any recent infections.  Discussed the importance of holding Taltz if she develops signs or symptoms of infection and to resume once the infection has completely cleared. She has received annual influenza vaccination and was strongly encouraged to receive the COVID-19 booster.  Sacroiliitis ([place]): She had no SI joint tenderness to palpation on examination today.  She is not been experiencing any nocturnal pain or increased morning stiffness.  She will remain on Taltz as prescribed.  Medial epicondylitis of both elbows: Resolved  DDD (degenerative disc disease), cervical: She has good range of motion of the C-spine with no discomfort at this time.  No symptoms of radiculopathy.  Trapezius muscle spasm: Resolved  Trochanteric bursitis, left hip: Resolved  H/O total knee replacement, bilateral: Doing well.  She has good range of motion of both knee replacements with no discomfort.  No warmth or effusion was noted.  She experiences occasional discomfort when climbing steps.  Other medical conditions are listed as follows:  Lung nodule seen on imaging study,  CTA of chest, 6 mm nodule, followed by Dr. Virgina Jock  History of diabetes mellitus  History of hypertension: Blood pressure was 135/79 today in the office.  Chronic diastolic heart failure (HCC)  History of hyperlipidemia  History of TIA (transient ischemic attack)  History of sleep apnea  Orders: No orders of the defined types were placed in this encounter.  No orders of the defined types were placed in this encounter.   Follow-Up Instructions: Return in about 3 months (around 11/02/2021) for Spondyloarthropathy, DDD.   Ofilia Neas, PA-C  Note - This record has been created using Dragon software.  Chart creation errors have been sought, but may not always  have been located. Such creation errors do not reflect on  the standard of medical care.

## 2021-07-22 DIAGNOSIS — K219 Gastro-esophageal reflux disease without esophagitis: Secondary | ICD-10-CM | POA: Diagnosis not present

## 2021-07-22 DIAGNOSIS — I129 Hypertensive chronic kidney disease with stage 1 through stage 4 chronic kidney disease, or unspecified chronic kidney disease: Secondary | ICD-10-CM | POA: Diagnosis not present

## 2021-07-22 DIAGNOSIS — N182 Chronic kidney disease, stage 2 (mild): Secondary | ICD-10-CM | POA: Diagnosis not present

## 2021-07-22 DIAGNOSIS — S239XXA Sprain of unspecified parts of thorax, initial encounter: Secondary | ICD-10-CM | POA: Diagnosis not present

## 2021-07-22 DIAGNOSIS — E119 Type 2 diabetes mellitus without complications: Secondary | ICD-10-CM | POA: Diagnosis not present

## 2021-07-28 ENCOUNTER — Other Ambulatory Visit: Payer: Self-pay | Admitting: *Deleted

## 2021-07-28 DIAGNOSIS — Z79899 Other long term (current) drug therapy: Secondary | ICD-10-CM

## 2021-07-29 LAB — CBC WITH DIFFERENTIAL/PLATELET
Absolute Monocytes: 737 cells/uL (ref 200–950)
Basophils Absolute: 51 cells/uL (ref 0–200)
Basophils Relative: 0.5 %
Eosinophils Absolute: 303 cells/uL (ref 15–500)
Eosinophils Relative: 3 %
HCT: 40.1 % (ref 35.0–45.0)
Hemoglobin: 12.2 g/dL (ref 11.7–15.5)
Lymphs Abs: 2626 cells/uL (ref 850–3900)
MCH: 25.8 pg — ABNORMAL LOW (ref 27.0–33.0)
MCHC: 30.4 g/dL — ABNORMAL LOW (ref 32.0–36.0)
MCV: 85 fL (ref 80.0–100.0)
MPV: 9.9 fL (ref 7.5–12.5)
Monocytes Relative: 7.3 %
Neutro Abs: 6383 cells/uL (ref 1500–7800)
Neutrophils Relative %: 63.2 %
Platelets: 254 10*3/uL (ref 140–400)
RBC: 4.72 10*6/uL (ref 3.80–5.10)
RDW: 13.8 % (ref 11.0–15.0)
Total Lymphocyte: 26 %
WBC: 10.1 10*3/uL (ref 3.8–10.8)

## 2021-07-29 LAB — COMPLETE METABOLIC PANEL WITH GFR
AG Ratio: 1.5 (calc) (ref 1.0–2.5)
ALT: 13 U/L (ref 6–29)
AST: 12 U/L (ref 10–35)
Albumin: 4.1 g/dL (ref 3.6–5.1)
Alkaline phosphatase (APISO): 100 U/L (ref 37–153)
BUN: 14 mg/dL (ref 7–25)
CO2: 24 mmol/L (ref 20–32)
Calcium: 9.3 mg/dL (ref 8.6–10.4)
Chloride: 108 mmol/L (ref 98–110)
Creat: 0.85 mg/dL (ref 0.50–1.03)
Globulin: 2.7 g/dL (calc) (ref 1.9–3.7)
Glucose, Bld: 96 mg/dL (ref 65–99)
Potassium: 4.4 mmol/L (ref 3.5–5.3)
Sodium: 140 mmol/L (ref 135–146)
Total Bilirubin: 0.3 mg/dL (ref 0.2–1.2)
Total Protein: 6.8 g/dL (ref 6.1–8.1)
eGFR: 79 mL/min/{1.73_m2} (ref >=60)

## 2021-07-29 NOTE — Progress Notes (Signed)
CBC and CMP normal

## 2021-08-02 ENCOUNTER — Encounter: Payer: Self-pay | Admitting: Physician Assistant

## 2021-08-02 ENCOUNTER — Ambulatory Visit: Payer: Medicare HMO | Admitting: Physician Assistant

## 2021-08-02 ENCOUNTER — Other Ambulatory Visit: Payer: Self-pay

## 2021-08-02 VITALS — BP 135/79 | HR 96 | Resp 16 | Ht 62.0 in | Wt 271.4 lb

## 2021-08-02 DIAGNOSIS — R911 Solitary pulmonary nodule: Secondary | ICD-10-CM | POA: Diagnosis not present

## 2021-08-02 DIAGNOSIS — I5032 Chronic diastolic (congestive) heart failure: Secondary | ICD-10-CM

## 2021-08-02 DIAGNOSIS — Z8669 Personal history of other diseases of the nervous system and sense organs: Secondary | ICD-10-CM

## 2021-08-02 DIAGNOSIS — M62838 Other muscle spasm: Secondary | ICD-10-CM

## 2021-08-02 DIAGNOSIS — M461 Sacroiliitis, not elsewhere classified: Secondary | ICD-10-CM

## 2021-08-02 DIAGNOSIS — M7702 Medial epicondylitis, left elbow: Secondary | ICD-10-CM

## 2021-08-02 DIAGNOSIS — Z8679 Personal history of other diseases of the circulatory system: Secondary | ICD-10-CM

## 2021-08-02 DIAGNOSIS — M7701 Medial epicondylitis, right elbow: Secondary | ICD-10-CM | POA: Diagnosis not present

## 2021-08-02 DIAGNOSIS — M47819 Spondylosis without myelopathy or radiculopathy, site unspecified: Secondary | ICD-10-CM

## 2021-08-02 DIAGNOSIS — Z79899 Other long term (current) drug therapy: Secondary | ICD-10-CM

## 2021-08-02 DIAGNOSIS — M7062 Trochanteric bursitis, left hip: Secondary | ICD-10-CM | POA: Diagnosis not present

## 2021-08-02 DIAGNOSIS — Z8639 Personal history of other endocrine, nutritional and metabolic disease: Secondary | ICD-10-CM

## 2021-08-02 DIAGNOSIS — Z8673 Personal history of transient ischemic attack (TIA), and cerebral infarction without residual deficits: Secondary | ICD-10-CM

## 2021-08-02 DIAGNOSIS — M5030 Other cervical disc degeneration, unspecified cervical region: Secondary | ICD-10-CM

## 2021-08-02 DIAGNOSIS — Z96653 Presence of artificial knee joint, bilateral: Secondary | ICD-10-CM

## 2021-08-02 NOTE — Patient Instructions (Signed)
Standing Labs We placed an order today for your standing lab work.   Please have your standing labs drawn in February and every 3 months   If possible, please have your labs drawn 2 weeks prior to your appointment so that the provider can discuss your results at your appointment.  Please note that you may see your imaging and lab results in MyChart before we have reviewed them. We may be awaiting multiple results to interpret others before contacting you. Please allow our office up to 72 hours to thoroughly review all of the results before contacting the office for clarification of your results.  We have open lab daily: Monday through Thursday from 1:30-4:30 PM and Friday from 1:30-4:00 PM at the office of Dr. Shaili Deveshwar, [facility].   Please be advised, all patients with office appointments requiring lab work will take precedent over walk-in lab work.  If possible, please come for your lab work on Monday and Friday afternoons, as you may experience shorter wait times. The office is located at [address], [address], [place], [place] [address] No appointment is necessary.   Labs are drawn by [facility]. Please bring your co-pay at the time of your lab draw.  You may receive a bill from [facility] for your lab work.  If you wish to have your labs drawn at another location, please call the office 24 hours in advance to send orders.  If you have any questions regarding directions or hours of operation,  please call [phone].   As a reminder, please drink plenty of water prior to coming for your lab work. Thanks!  

## 2021-08-03 NOTE — Telephone Encounter (Signed)
Submitted Patient Assistance RENEWAL Application to [facility] for TALTZ along with provider portion, patient portion, insurance card copy, med list, PA approval letter, and income documents.  Will update patient when we receive a response.   Fax# [phone] Phone# [phone] Patient Case ID: [id number]  Knox Saliva, PharmD, MPH, BCPS Clinical Pharmacist (Rheumatology and Pulmonology)

## 2021-08-09 NOTE — Telephone Encounter (Signed)
Received a fax from  [facility] regarding an approval for [place] patient assistance from now until 09/11/2022. Approval letter sent to scan center.  Phone number: [phone]

## 2021-08-11 DIAGNOSIS — G4733 Obstructive sleep apnea (adult) (pediatric): Secondary | ICD-10-CM | POA: Diagnosis not present

## 2021-08-12 ENCOUNTER — Ambulatory Visit: Payer: Medicare HMO | Admitting: Cardiovascular Disease

## 2021-08-31 DIAGNOSIS — M542 Cervicalgia: Secondary | ICD-10-CM | POA: Diagnosis not present

## 2021-08-31 DIAGNOSIS — G518 Other disorders of facial nerve: Secondary | ICD-10-CM | POA: Diagnosis not present

## 2021-08-31 DIAGNOSIS — G43709 Chronic migraine without aura, not intractable, without status migrainosus: Secondary | ICD-10-CM | POA: Diagnosis not present

## 2021-08-31 DIAGNOSIS — M791 Myalgia, unspecified site: Secondary | ICD-10-CM | POA: Diagnosis not present

## 2021-09-06 ENCOUNTER — Other Ambulatory Visit: Payer: Self-pay | Admitting: Rheumatology

## 2021-09-06 DIAGNOSIS — M47819 Spondylosis without myelopathy or radiculopathy, site unspecified: Secondary | ICD-10-CM

## 2021-09-07 NOTE — Telephone Encounter (Signed)
Next Visit: 11/03/2021  Last Visit: 08/02/2021  Last Fill: 06/22/2021  [id number]  Current Dose per office note 08/02/2021: Taltz 80 mg subcutaneous injections every 28 days.  Labs: 07/28/2021 CBC and CMP normal.  TB Gold: 01/20/2021 Neg   Okay to refill Taltz?

## 2021-09-11 DIAGNOSIS — G4733 Obstructive sleep apnea (adult) (pediatric): Secondary | ICD-10-CM | POA: Diagnosis not present

## 2021-09-24 DIAGNOSIS — R490 Dysphonia: Secondary | ICD-10-CM | POA: Diagnosis not present

## 2021-09-24 DIAGNOSIS — K219 Gastro-esophageal reflux disease without esophagitis: Secondary | ICD-10-CM | POA: Diagnosis not present

## 2021-10-06 NOTE — Progress Notes (Signed)
Cardiology Office Note:    Date:  10/07/2021   ID:  [name], DOB August 11, 1961, MRN [id number]  PCP:  Shon Baton, MD [place] Cardiologist: Sanda Klein, MD; Dr. Claiborne Billings for sleep  Reason for visit: 1 year follow-up    History of Present Illness:    [name] is a 61 y.o. female with a hx of chronic diastolic CHF, morbid obesity, OSA, TIA, GERD, HTN, HLD, DM, asthma, anemia, former tobacco abuse, ankylosing spondylitis.    She last saw Dr. Sallyanne Kuster in November 2021 and was doing well with stable exertional dyspnea (stop at the top of the stairs) and occasional mild ankle edema.  Today, she feels well.  She shows me her blood pressure log.  Systolic blood pressure runs anywhere between 100s to 140s.  She takes irbesartan 75 mg half tablet as needed for systolic blood pressure over 130 in the mornings.  Her AM glucoses appeared well controlled, less than 120.  When she checks her blood pressure, her monitor sometimes says she has irregular heartbeat.  She sometimes feels her heart racing.  She had a normal heart monitor in 2017.  She denies orthopnea and PND.  She rarely has leg swelling and last needed to take Lasix 2 months ago.  She typically takes Lasix 40 mg half tablet for 3 days when she has leg swelling.  She states she is compliant with her CPAP.  She states it is hard for her weight to get less than 250 pounds.  She is walking and does the bicycle at the [facility].  Her mobility is limited post knee replacements.  She is hoping to start water aerobics in the future.      Past Medical History:  Diagnosis Date   Anemia    takes iron supplement   Anxiety    Asthma    daily and prn inhalers   Bone spur    Left foot   Chest pain    a. 10/2015: NST showing possible mild ischemia but most consistent with breast attenuation. Low-risk study.    Chronic diastolic CHF (congestive heart failure) (HCC)    Common migraine with intractable migraine  07/06/2018   DDD (degenerative disc disease), cervical    Degenerative disc disease    Depression    Diabetes mellitus ([place])    Essential hypertension    Family history of early CAD    Former tobacco use    GERD (gastroesophageal reflux disease)    GERD (gastroesophageal reflux disease)    Headache(784.0)    migraine-like   History of TIA (transient ischemic attack) early 2013   no weakness or deficits   History of UTI    Hyperlipidemia    Insomnia    Migraines    Morbid obesity ([place])    Partial tear of rotator cuff(726.13)    Psoriatic arthritis ([place])    Shoulder impingement 04/2012   left   Sleep apnea    uses CPAP nightly    Past Surgical History:  Procedure Laterality Date   ABDOMINAL HYSTERECTOMY  03/28/2002   complete   ANTERIOR CERVICAL DECOMP/DISCECTOMY FUSION  02/18/2010   C6-7   BREAST BIOPSY Right    DILATION AND CURETTAGE OF UTERUS     2000   FOOT SURGERY Right 2015   KNEE ARTHROPLASTY     ROTATOR CUFF REPAIR Left 2013   TOTAL KNEE ARTHROPLASTY  09/07/2009   left   TOTAL KNEE ARTHROPLASTY  10/13/2008   right  TOTAL SHOULDER ARTHROPLASTY     UMBILICAL HERNIA REPAIR  03/28/2002   wisidom     WRIST SURGERY  2020    Current Medications: Current Meds  Medication Sig   acidophilus (RISAQUAD) CAPS Take 1 capsule by mouth every morning.    albuterol (VENTOLIN HFA) 108 (90 Base) MCG/ACT inhaler 2 puff q 4-6 hours prn wheezes   ALPRAZolam (XANAX) 0.5 MG tablet Take 0.5 mg by mouth 2 (two) times daily as needed.   amoxicillin (AMOXIL) 500 MG capsule Take 2,000 mg by mouth as needed. Prior to dental work   aspirin 81 MG tablet Take 81 mg by mouth daily.   atorvastatin (LIPITOR) 20 MG tablet Take 20 mg by mouth every evening.    Azelastine HCl 0.15 % SOLN    baclofen (LIORESAL) 10 MG tablet Take 10-20 mg by mouth See admin instructions. Weekly as needed   calcium carbonate (TUMS E-X 750) 750 MG chewable tablet    chlorproMAZINE (THORAZINE) 25 MG tablet Take 25-50  mg by mouth See admin instructions. every 3 hours as needed max twice month   diclofenac (VOLTAREN) 50 MG EC tablet Take 50 mg by mouth as needed.   diclofenac sodium (VOLTAREN) 1 % GEL Apply three grams to three large joints up to three times daily   DULoxetine (CYMBALTA) 60 MG capsule Take 60 mg by mouth daily.    ferrous sulfate 325 (65 FE) MG tablet Take 325 mg by mouth 2 (two) times a week.   furosemide (LASIX) 40 MG tablet TAKE 1 TABLET BY MOUTH DAILY (Patient taking differently: 20 mg as needed.)   HYDROcodone-acetaminophen (NORCO/VICODIN) 5-325 MG per tablet Take 1 tablet by mouth as needed for moderate pain.    hydrOXYzine (ATARAX/VISTARIL) 25 MG tablet Take 25 mg by mouth as directed.   irbesartan (AVAPRO) 75 MG tablet Take 37.5 mg by mouth. Take only if blood pressure above 130   Ixekizumab (TALTZ) 80 MG/ML SOAJ INJECT 80MG (1 PEN) UNDER THE SKIN EVERY 4 WEEKS   loratadine (CLARITIN) 10 MG tablet Take 10 mg by mouth daily.   metFORMIN (GLUCOPHAGE) 500 MG tablet Take 500 mg by mouth daily with breakfast.   methocarbamol (ROBAXIN) 750 MG tablet 2 tabs up to 3xd as needed for muscle spasm   Multiple Vitamin (MULITIVITAMIN WITH MINERALS) TABS Take 1 tablet by mouth daily.   mupirocin nasal ointment (BACTROBAN) 2 % Place 1 application into the nose 2 (two) times daily. Use one-half of tube in each . After application, press sides of nose together and gently massage. As Needed   NON FORMULARY at bedtime. CPAP   Olopatadine HCl (PATADAY OP) Place 1 drop into both eyes as needed.    ONE TOUCH ULTRA TEST test strip    ONETOUCH DELICA LANCETS 16X MISC    pantoprazole (PROTONIX) 40 MG tablet Take 40 mg by mouth daily.   spironolactone (ALDACTONE) 25 MG tablet Take 0.5 tablets (12.5 mg total) by mouth daily.   topiramate (TOPAMAX) 100 MG tablet Take 200 mg by mouth at bedtime. Takes 2 tabs daily at bedtime TOTAL 400MG   vitamin B-12 (CYANOCOBALAMIN) 500 MCG tablet Take 500 mcg by mouth daily as  needed.    [DISCONTINUED] potassium chloride SA (K-DUR,KLOR-CON) 20 MEQ tablet Take 40 mEq by mouth 2 (two) times daily.      Allergies:   Patient has no known allergies.   Social History   Socioeconomic History   Marital status: Married    Spouse name: Jenny Reichmann  Number of children: 5   Years of education: some college   Highest education level: Not on file  Occupational History   Occupation: Disabled  Tobacco Use   Smoking status: Former    Packs/day: 0.50    Years: 20.00    Pack years: 10.00    Types: Cigarettes    Quit date: 10/02/1999    Years since quitting: 22.0   Smokeless tobacco: Never  Vaping Use   Vaping Use: Never used  Substance and Sexual Activity   Alcohol use: No   Drug use: No   Sexual activity: Yes    Birth control/protection: None  Other Topics Concern   Not on file  Social History Narrative   Lives with husband, John   Caffeine use: 1 cup coffee every morning   Right handed    Social Determinants of Health   Financial Resource Strain: Not on file  Food Insecurity: Not on file  Transportation Needs: Not on file  Physical Activity: Not on file  Stress: Not on file  Social Connections: Not on file     Family History: The patient's family history includes Breast cancer in her sister and sister; Cancer in her maternal grandmother and paternal grandfather; Coronary artery disease in her sister; Crohn's disease in her cousin, cousin, maternal aunt, and paternal aunt; Diabetes in her father, maternal grandmother, mother, paternal grandfather, sister, and sister; Hypertension in her father, maternal grandmother, and mother; Kidney disease in her father.  ROS:   Please see the history of present illness.     EKGs/Labs/Other Studies Reviewed:    EKG:  The ekg ordered today demonstrates normal sinus rhythm, heart rate 78, PR interval 138 ms, QRS duration 96 ms.  Recent Labs: 07/28/2021: ALT 13; BUN 14; Creat 0.85; Hemoglobin 12.2; Platelets 254;  Potassium 4.4; Sodium 140   Recent Lipid Panel Lab Results  Component Value Date/Time   CHOL 143 03/12/2013 02:00 AM   TRIG 128 03/12/2013 02:00 AM   HDL 42 03/12/2013 02:00 AM   LDLCALC 75 03/12/2013 02:00 AM    Physical Exam:    VS:  BP 117/76    Pulse 78    Ht _0  (1.575 m)    Wt 268 lb 12.8 oz (121.9 kg)    SpO2 99%    BMI 49.16 kg/m    No data found.  Wt Readings from Last 3 Encounters:  10/07/21 268 lb 12.8 oz (121.9 kg)  08/02/21 271 lb 6.4 oz (123.1 kg)  06/22/21 270 lb 9.6 oz (122.7 kg)     GEN:  Well nourished, well developed in no acute distress, obese HEENT: Normal NECK: No JVD; No carotid bruits CARDIAC: RRR, no murmurs, rubs, gallops RESPIRATORY:  Clear to auscultation without rales, wheezing or rhonchi  ABDOMEN: Soft, non-tender, non-distended MUSCULOSKELETAL: No edema; No deformity  SKIN: Warm and dry NEUROLOGIC:  Alert and oriented PSYCHIATRIC:  Normal affect     ASSESSMENT AND PLAN    Chronic diastolic heart failure, euvolemic -2D echo 2019 with EF 81-85%, grade 2 diastolic dysfunction, mild LVH, mild mitral regurgitation. -Start spironolactone 12.5 mg daily.  Check BMET today.  Stop potassium supplement (currently taking K 59m twice daily).   -Recheck metabolic panel on Monday to see if any potassium supplementation needed. -Follow-up with her in 8 weeks.  Consider adding Jardiance to reduce cardiovascular death and heart failure hospitalizations.  Hypertension, well controlled -She will likely not need to take her as needed irbesartan as much after starting spironolactone. -Goal  BP is <130/80.  Recommend DASH diet (high in vegetables, fruits, low-fat dairy products, whole grains, poultry, fish, and nuts and low in sweets, sugar-sweetened beverages, and red meats), salt restriction and increase physical activity.  Hyperlipidemia -LDL 90 in January 2022.  She is having lipids checked next week. -Continue Lipitor 20 mg daily.  If LDL remains over  70, consider increasing Lipitor dose. -Discussed cholesterol lowering diets - Mediterranean diet, DASH diet, vegetarian diet, low-carbohydrate diet and avoidance of trans fats.  Discussed healthier choice substitutes.  Nuts, high-fiber foods, and fiber supplements may also improve lipids.    Palpitations -Order 2-week ZIO monitor.  Review results at 8 week appointment.  She is at increased risk for A. fib given her obesity, sleep apnea and diabetes.  Obesity -Discussed how even a 5-10% weight loss can have cardiovascular benefits.   -Recommend moderate intensity activity for 30 minutes 5 days/week and the DASH diet. -Refer to [facility] prep program.  OSA -Continue CPAP use.  Follow-up with Dr. Claiborne Billings annually.  Disposition - Follow-up in 8 weeks with me.  Review Zio patch, review blood pressure/lipid control, consider adding Jardiance and follow-up on exercise/weight loss efforts.      Medication Adjustments/Labs and Tests Ordered: Current medicines are reviewed at length with the patient today.  Concerns regarding medicines are outlined above.  Orders Placed This Encounter  Procedures   Basic metabolic panel   Amb Referral To Provider Referral Exercise Program (P.R.E.P)   LONG TERM MONITOR (3-14 DAYS)   EKG 12-Lead   Meds ordered this encounter  Medications   spironolactone (ALDACTONE) 25 MG tablet    Sig: Take 0.5 tablets (12.5 mg total) by mouth daily.    Dispense:  30 tablet    Refill:  3    Patient Instructions  Medication Instructions:  Start Spironolactone 12.5 mg ( Take 0.5 Tablet of 25 mg Tablet Daily). Stop potassium. *If you need a refill on your cardiac medications before your next appointment, please call your pharmacy*   Lab Work: BMET. Today If you have labs (blood work) drawn today and your tests are completely normal, you will receive your results only by: [place] (if you have MyChart) OR A paper copy in the mail If you have any lab test that is  abnormal or we need to change your treatment, we will call you to review the results.   Testing/Procedures: ZIO AT Long term monitor-Live Telemetry  Your physician has requested you wear a ZIO patch monitor for 14 days.  This is a single patch monitor. [employer] supplies one patch monitor per enrollment. Additional  stickers are not available.  Please do not apply patch if you will be having a Nuclear Stress Test, Echocardiogram, Cardiac CT, MRI,  or Chest Xray during the period you would be wearing the monitor. The patch cannot be worn during  these tests. You cannot remove and re-apply the ZIO AT patch monitor.  Your ZIO patch monitor will be mailed 3 day USPS to your address on file. It may take 3-5 days to  receive your monitor after you have been enrolled.  Once you have received your monitor, please review the enclosed instructions. Your monitor has  already been registered assigning a specific monitor serial # to you.   Billing and Patient Assistance Program information  Theodore Demark has been supplied with any insurance information on record for billing. [employer] offers a sliding scale Patient Assistance Program for patients without insurance, or whose  insurance does not completely  cover the cost of the ZIO patch monitor. You must apply for the  Patient Assistance Program to qualify for the discounted rate. To apply, call [employer] at [phone],  select option 4, select option 2 , ask to apply for the Patient Assistance Program, (you can request an  interpreter if needed). [employer] will ask your household income and how many people are in your  household. [employer] will quote your out-of-pocket cost based on this information. They will also be able  to set up a 12 month interest free payment plan if needed.  Applying the monitor   Shave hair from upper left chest.  Hold the abrader disc by orange tab. Rub the abrader in 40 strokes over left upper chest as indicated in  your monitor  instructions.  Clean area with 4 enclosed alcohol pads. Use all pads to ensure the area is cleaned thoroughly. Let  dry.  Apply patch as indicated in monitor instructions. Patch will be placed under collarbone on left side of  chest with arrow pointing upward.  Rub patch adhesive wings for 2 minutes. Remove the white label marked "1". Remove the white label  marked "2". Rub patch adhesive wings for 2 additional minutes.  While looking in a mirror, press and release button in center of patch. A small green light will flash 3-4  times. This will be your only indicator that the monitor has been turned on.  Do not shower for the first 24 hours. You may shower after the first 24 hours.  Press the button if you feel a symptom. You will hear a small click. Record Date, Time and Symptom in  the Patient Log.   Starting the Gateway  In your kit there is a Hydrographic surveyor box the size of a cellphone. This is Airline pilot. It transmits all your  recorded data to [facility]. This box must always stay within 10 feet of you. Open the box and push the *  button. There will be a light that blinks orange and then green a few times. When the light stops  blinking, the Gateway is connected to the ZIO patch. Call [employer] at [phone] to confirm your monitor is transmitting.  Returning your monitor  Remove your patch and place it inside the [place]. In the lower half of the Gateway there is a white  bag with prepaid postage on it. Place Gateway in bag and seal. Mail package back to [place] as soon as  possible. Your physician should have your final report approximately 7 days after you have mailed back  your monitor. Call [place] at [phone] if you have questions regarding your ZIO AT  patch monitor. Call them immediately if you see an orange light blinking on your monitor.  If your monitor falls off in less than 4 days, contact our Monitor department at [phone]. If your   monitor becomes loose or falls off after 4 days call [employer] at [phone] for suggestions on  securing your monitor    Follow-Up: At [facility], you and your health needs are our priority.  As part of our continuing mission to provide you with exceptional heart care, we have created designated Provider Care Teams.  These Care Teams include your primary Cardiologist (physician) and Advanced Practice Providers (APPs -  Physician Assistants and Nurse Practitioners) who all work together to provide you with the care you need, when you need it.  We recommend signing up for the patient portal called "MyChart".  Sign  up information is provided on this After Visit Summary.  MyChart is used to connect with patients for Virtual Visits (Telemedicine).  Patients are able to view lab/test results, encounter notes, upcoming appointments, etc.  Non-urgent messages can be sent to your provider as well.   To learn more about what you can do with MyChart, go to [email or web address].    Your next appointment:   8 week(s)  The format for your next appointment:   In Person  Provider:   Caron Presume, PA-C    Then, Sanda Klein, MD will plan to see you again in 6 month(s).    Signed, Warren Lacy, PA-C  10/07/2021 8:48 AM    [facility]

## 2021-10-07 ENCOUNTER — Ambulatory Visit: Payer: Medicare HMO | Admitting: Physician Assistant

## 2021-10-07 ENCOUNTER — Encounter: Payer: Self-pay | Admitting: Physician Assistant

## 2021-10-07 ENCOUNTER — Ambulatory Visit (INDEPENDENT_AMBULATORY_CARE_PROVIDER_SITE_OTHER): Payer: Medicare HMO

## 2021-10-07 ENCOUNTER — Other Ambulatory Visit: Payer: Self-pay

## 2021-10-07 VITALS — BP 117/76 | HR 78 | Ht 62.0 in | Wt 268.8 lb

## 2021-10-07 DIAGNOSIS — G4733 Obstructive sleep apnea (adult) (pediatric): Secondary | ICD-10-CM

## 2021-10-07 DIAGNOSIS — E785 Hyperlipidemia, unspecified: Secondary | ICD-10-CM

## 2021-10-07 DIAGNOSIS — I10 Essential (primary) hypertension: Secondary | ICD-10-CM

## 2021-10-07 DIAGNOSIS — I5032 Chronic diastolic (congestive) heart failure: Secondary | ICD-10-CM

## 2021-10-07 DIAGNOSIS — R002 Palpitations: Secondary | ICD-10-CM | POA: Diagnosis not present

## 2021-10-07 LAB — BASIC METABOLIC PANEL
BUN/Creatinine Ratio: 20 (ref 12–28)
BUN: 18 mg/dL (ref 8–27)
CO2: 22 mmol/L (ref 20–29)
Calcium: 9.7 mg/dL (ref 8.7–10.3)
Chloride: 108 mmol/L — ABNORMAL HIGH (ref 96–106)
Creatinine, Ser: 0.9 mg/dL (ref 0.57–1.00)
Glucose: 91 mg/dL (ref 70–99)
Potassium: 4.5 mmol/L (ref 3.5–5.2)
Sodium: 142 mmol/L (ref 134–144)
eGFR: 73 mL/min/{1.73_m2} (ref >59)

## 2021-10-07 MED ORDER — SPIRONOLACTONE 25 MG PO TABS: 12.5000 mg | ORAL_TABLET | Freq: Every day | ORAL | 3 refills | Status: DC

## 2021-10-07 NOTE — Patient Instructions (Signed)
Medication Instructions:  Start Spironolactone 12.5 mg ( Take 0.5 Tablet of 25 mg Tablet Daily). Stop potassium. *If you need a refill on your cardiac medications before your next appointment, please call your pharmacy*   Lab Work: BMET. Today If you have labs (blood work) drawn today and your tests are completely normal, you will receive your results only by: [place] (if you have MyChart) OR A paper copy in the mail If you have any lab test that is abnormal or we need to change your treatment, we will call you to review the results.   Testing/Procedures: ZIO AT Long term monitor-Live Telemetry  Your physician has requested you wear a ZIO patch monitor for 14 days.  This is a single patch monitor. [employer] supplies one patch monitor per enrollment. Additional  stickers are not available.  Please do not apply patch if you will be having a Nuclear Stress Test, Echocardiogram, Cardiac CT, MRI,  or Chest Xray during the period you would be wearing the monitor. The patch cannot be worn during  these tests. You cannot remove and re-apply the ZIO AT patch monitor.  Your ZIO patch monitor will be mailed 3 day USPS to your address on file. It may take 3-5 days to  receive your monitor after you have been enrolled.  Once you have received your monitor, please review the enclosed instructions. Your monitor has  already been registered assigning a specific monitor serial # to you.   Billing and Patient Assistance Program information  Theodore Demark has been supplied with any insurance information on record for billing. [employer] offers a sliding scale Patient Assistance Program for patients without insurance, or whose  insurance does not completely cover the cost of the ZIO patch monitor. You must apply for the  Patient Assistance Program to qualify for the discounted rate. To apply, call [employer] at [phone],  select option 4, select option 2 , ask to apply for the Patient Assistance Program,  (you can request an  interpreter if needed). [employer] will ask your household income and how many people are in your  household. [employer] will quote your out-of-pocket cost based on this information. They will also be able  to set up a 12 month interest free payment plan if needed.  Applying the monitor   Shave hair from upper left chest.  Hold the abrader disc by orange tab. Rub the abrader in 40 strokes over left upper chest as indicated in  your monitor instructions.  Clean area with 4 enclosed alcohol pads. Use all pads to ensure the area is cleaned thoroughly. Let  dry.  Apply patch as indicated in monitor instructions. Patch will be placed under collarbone on left side of  chest with arrow pointing upward.  Rub patch adhesive wings for 2 minutes. Remove the white label marked "1". Remove the white label  marked "2". Rub patch adhesive wings for 2 additional minutes.  While looking in a mirror, press and release button in center of patch. A small green light will flash 3-4  times. This will be your only indicator that the monitor has been turned on.  Do not shower for the first 24 hours. You may shower after the first 24 hours.  Press the button if you feel a symptom. You will hear a small click. Record Date, Time and Symptom in  the Patient Log.   Starting the Gateway  In your kit there is a Hydrographic surveyor box the size of a cellphone. This is your  Gateway. It transmits all your  recorded data to [facility]. This box must always stay within 10 feet of you. Open the box and push the *  button. There will be a light that blinks orange and then green a few times. When the light stops  blinking, the Gateway is connected to the ZIO patch. Call [employer] at [phone] to confirm your monitor is transmitting.  Returning your monitor  Remove your patch and place it inside the [place]. In the lower half of the Gateway there is a white  bag with prepaid postage on it. Place Gateway in bag and  seal. Mail package back to [employer] as soon as  possible. Your physician should have your final report approximately 7 days after you have mailed back  your monitor. Call [place] at [phone] if you have questions regarding your ZIO AT  patch monitor. Call them immediately if you see an orange light blinking on your monitor.  If your monitor falls off in less than 4 days, contact our Monitor department at [phone]. If your  monitor becomes loose or falls off after 4 days call [employer] at [phone] for suggestions on  securing your monitor    Follow-Up: At [facility], you and your health needs are our priority.  As part of our continuing mission to provide you with exceptional heart care, we have created designated Provider Care Teams.  These Care Teams include your primary Cardiologist (physician) and Advanced Practice Providers (APPs -  Physician Assistants and Nurse Practitioners) who all work together to provide you with the care you need, when you need it.  We recommend signing up for the patient portal called "MyChart".  Sign up information is provided on this After Visit Summary.  MyChart is used to connect with patients for Virtual Visits (Telemedicine).  Patients are able to view lab/test results, encounter notes, upcoming appointments, etc.  Non-urgent messages can be sent to your provider as well.   To learn more about what you can do with MyChart, go to [email or web address].    Your next appointment:   8 week(s)  The format for your next appointment:   In Person  Provider:   Caron Presume, PA-C    Then, Sanda Klein, MD will plan to see you again in 6 month(s).

## 2021-10-07 NOTE — Progress Notes (Unsigned)
Enrolled patient for a 14 day Zio XT monitor to be mailed to patients home.   [place] to read

## 2021-10-08 ENCOUNTER — Telehealth: Payer: Self-pay

## 2021-10-08 NOTE — Telephone Encounter (Signed)
Called to discuss PREP program referral, left voicemail  

## 2021-10-09 DIAGNOSIS — G4733 Obstructive sleep apnea (adult) (pediatric): Secondary | ICD-10-CM

## 2021-10-09 DIAGNOSIS — I5032 Chronic diastolic (congestive) heart failure: Secondary | ICD-10-CM | POA: Diagnosis not present

## 2021-10-09 DIAGNOSIS — R002 Palpitations: Secondary | ICD-10-CM | POA: Diagnosis not present

## 2021-10-09 DIAGNOSIS — I10 Essential (primary) hypertension: Secondary | ICD-10-CM | POA: Diagnosis not present

## 2021-10-09 DIAGNOSIS — E785 Hyperlipidemia, unspecified: Secondary | ICD-10-CM

## 2021-10-11 ENCOUNTER — Telehealth: Payer: Self-pay | Admitting: *Deleted

## 2021-10-11 DIAGNOSIS — E119 Type 2 diabetes mellitus without complications: Secondary | ICD-10-CM | POA: Diagnosis not present

## 2021-10-11 DIAGNOSIS — E785 Hyperlipidemia, unspecified: Secondary | ICD-10-CM | POA: Diagnosis not present

## 2021-10-11 NOTE — Telephone Encounter (Signed)
Patient walked in this morning to the [place] office after having labs done at her PCP due to having an allergic reaction to her ZIO monitor.  She was seen by Caron Presume PAC on 10/07/21 and a 2 week ZIO was ordered for palpitations.  She placed the monitor on herself on Sat 10/09/21 around 2:30 pm.  Today she noticed what looked like a blister around the larger border of the patch edge under the Tegaderm part(see picture).  I have rolled back the edges of the Tegaderm and place gauze over the open area secured with paper tape.  She states she has never had an allergy to tape that she knows of.  She has been instructed to keep the gauze on when she showers and change once she is done.  If the monitor begins to come off we can cancel the ZIO and order a [employer] monitor as they have sensitive skin patches.  She says she has had some palpitations during the short time she has had the ZIO on.  The hope is this will stay on for the remaining time but if not she will call the office and let us know.  She was appreciative of the help she received.  I supplied her with extra gauze and paper tape to use.

## 2021-10-12 DIAGNOSIS — G4733 Obstructive sleep apnea (adult) (pediatric): Secondary | ICD-10-CM | POA: Diagnosis not present

## 2021-10-13 ENCOUNTER — Telehealth: Payer: Self-pay

## 2021-10-13 NOTE — Telephone Encounter (Addendum)
Called patient regarding results----- Message from Warren Lacy, PA-C sent at 10/12/2021 12:07 PM EST ----- Electrolytes and kidney function normal.  Will review repeats lab to determine if labs stable on spironolactone.

## 2021-10-18 DIAGNOSIS — Z0000 Encounter for general adult medical examination without abnormal findings: Secondary | ICD-10-CM | POA: Diagnosis not present

## 2021-10-18 DIAGNOSIS — D8989 Other specified disorders involving the immune mechanism, not elsewhere classified: Secondary | ICD-10-CM | POA: Diagnosis not present

## 2021-10-18 DIAGNOSIS — Z1212 Encounter for screening for malignant neoplasm of rectum: Secondary | ICD-10-CM | POA: Diagnosis not present

## 2021-10-18 DIAGNOSIS — R82998 Other abnormal findings in urine: Secondary | ICD-10-CM | POA: Diagnosis not present

## 2021-10-18 DIAGNOSIS — I5189 Other ill-defined heart diseases: Secondary | ICD-10-CM | POA: Diagnosis not present

## 2021-10-18 DIAGNOSIS — Z1331 Encounter for screening for depression: Secondary | ICD-10-CM | POA: Diagnosis not present

## 2021-10-18 DIAGNOSIS — N182 Chronic kidney disease, stage 2 (mild): Secondary | ICD-10-CM | POA: Diagnosis not present

## 2021-10-18 DIAGNOSIS — Z1339 Encounter for screening examination for other mental health and behavioral disorders: Secondary | ICD-10-CM | POA: Diagnosis not present

## 2021-10-18 DIAGNOSIS — R69 Illness, unspecified: Secondary | ICD-10-CM | POA: Diagnosis not present

## 2021-10-18 DIAGNOSIS — E119 Type 2 diabetes mellitus without complications: Secondary | ICD-10-CM | POA: Diagnosis not present

## 2021-10-18 DIAGNOSIS — Z1589 Genetic susceptibility to other disease: Secondary | ICD-10-CM | POA: Diagnosis not present

## 2021-10-18 DIAGNOSIS — I129 Hypertensive chronic kidney disease with stage 1 through stage 4 chronic kidney disease, or unspecified chronic kidney disease: Secondary | ICD-10-CM | POA: Diagnosis not present

## 2021-10-18 DIAGNOSIS — M461 Sacroiliitis, not elsewhere classified: Secondary | ICD-10-CM | POA: Diagnosis not present

## 2021-10-21 NOTE — Progress Notes (Signed)
Office Visit Note  Patient: [name]             Date of Birth: 11/04/1960           MRN: [id number]             PCP: Shon Baton, MD Referring: Shon Baton, MD Visit Date: 11/03/2021 Occupation: @GUAROCC @  Subjective:  That medication management  History of Present Illness: Yuliza Cara is a 61 y.o. female with a history of a spondyloarthropathy and osteoarthritis.  She states she has been doing some work on her house.  She has been packing boxes.  She has been having some increased lower back pain.  She also complains of discomfort in her left hip which radiates down her leg in the trochanteric region.  She has been having discomfort in her left shoulder.  She has not noticed any joint swelling.  She has been taking Taltz on a regular basis and has not noticed any side effects.  Activities of Daily Living:  Patient reports morning stiffness for a few minutes.   Patient Reports nocturnal pain.  Difficulty dressing/grooming: Denies Difficulty climbing stairs: Reports Difficulty getting out of chair: Reports Difficulty using hands for taps, buttons, cutlery, and/or writing: Denies  Review of Systems  Constitutional:  Positive for fatigue.  HENT:  Positive for mouth dryness. Negative for mouth sores and nose dryness.   Eyes:  Negative for pain, itching and dryness.  Respiratory:  Negative for shortness of breath and difficulty breathing.   Cardiovascular:  Positive for chest pain and palpitations.       Followed by cardiologist  Gastrointestinal:  Positive for constipation. Negative for diarrhea.  Endocrine: Negative for increased urination.  Genitourinary:  Negative for difficulty urinating.  Musculoskeletal:  Positive for joint pain, joint pain, myalgias, morning stiffness and myalgias. Negative for joint swelling and muscle tenderness.  Skin:  Negative for color change, rash, redness and sensitivity to sunlight.  Allergic/Immunologic: Negative for  susceptible to infections.  Neurological:  Negative for dizziness, numbness, headaches, memory loss and weakness.  Hematological:  Negative for bruising/bleeding tendency.  Psychiatric/Behavioral:  Negative for depressed mood, confusion and sleep disturbance. The patient is not nervous/anxious.    PMFS History:  Patient Active Problem List   Diagnosis Date Noted   Spondyloarthropathy 07/26/2016    Priority: High   High risk medication use 07/26/2016    Priority: High   Chronic low back pain 07/26/2016    Priority: Medium    H/O total knee replacement, bilateral 07/26/2016    Priority: Medium    DJD (degenerative joint disease), cervical     Priority: Medium    Chronic diastolic heart failure ([place]) 11/29/2015    Priority: Low   Lung nodule seen on imaging study, CTA of chest, 6 mm nodule, followed by Dr. Virgina Jock 03/25/2013    Priority: Low   Super obese 03/12/2013    Priority: Low   Hypercholesterolemia     Priority: Low   OSA (obstructive sleep apnea)     Priority: Low   Essential hypertension     Priority: Low   History of TIA (transient ischemic attack) 9/13     Priority: Low   De Quervain's disease (radial styloid tenosynovitis) 11/27/2018   Vertigo 10/18/2018   Common migraine with intractable migraine 07/06/2018   Diabetes mellitus type 2 in obese ([place]) 06/29/2018   Dyspnea 06/03/2015   Family history of coronary artery disease 03/12/2013   Chest pain with low risk of  acute coronary syndrome - most likely musculoskeletal, negative nuc study 03/11/2013   Full thickness rotator cuff tear 05/15/2012   Partial tear of rotator cuff(726.13)    Shoulder impingement 04/12/2012    Past Medical History:  Diagnosis Date   Anemia    takes iron supplement   Anxiety    Asthma    daily and prn inhalers   Bone spur    Left foot   Chest pain    a. 10/2015: NST showing possible mild ischemia but most consistent with breast attenuation. Low-risk study.    Chronic diastolic CHF  (congestive heart failure) (HCC)    Common migraine with intractable migraine 07/06/2018   DDD (degenerative disc disease), cervical    Degenerative disc disease    Depression    Diabetes mellitus ([place])    Essential hypertension    Family history of early CAD    Former tobacco use    GERD (gastroesophageal reflux disease)    GERD (gastroesophageal reflux disease)    Headache(784.0)    migraine-like   History of TIA (transient ischemic attack) early 2013   no weakness or deficits   History of UTI    Hyperlipidemia    Insomnia    Migraines    Morbid obesity ([place])    Partial tear of rotator cuff(726.13)    Psoriatic arthritis ([place])    Shoulder impingement 04/2012   left   Sleep apnea    uses CPAP nightly    Family History  Problem Relation Age of Onset   Diabetes Mother    Hypertension Mother    Kidney disease Father    Hypertension Father    Diabetes Father    Breast cancer Sister    Diabetes Sister    Breast cancer Sister    Cancer Maternal Grandmother    Diabetes Maternal Grandmother    Hypertension Maternal Grandmother    Diabetes Paternal Grandfather    Cancer Paternal Grandfather    Coronary artery disease Sister    Diabetes Sister    Crohn's disease Maternal Aunt    Crohn's disease Cousin    Crohn's disease Cousin    Crohn's disease Paternal Aunt    Past Surgical History:  Procedure Laterality Date   ABDOMINAL HYSTERECTOMY  03/28/2002   complete   ANTERIOR CERVICAL DECOMP/DISCECTOMY FUSION  02/18/2010   C6-7   BREAST BIOPSY Right    DILATION AND CURETTAGE OF UTERUS     2000   FOOT SURGERY Right 2015   KNEE ARTHROPLASTY     ROTATOR CUFF REPAIR Left 2013   TOTAL KNEE ARTHROPLASTY  09/07/2009   left   TOTAL KNEE ARTHROPLASTY  10/13/2008   right   TOTAL SHOULDER ARTHROPLASTY     UMBILICAL HERNIA REPAIR  03/28/2002   [facility]     WRIST SURGERY  2020   Social History   Social History Narrative   Lives with husband, John   Caffeine use: 1 cup coffee  every morning   Right handed    Immunization History  Administered Date(s) Administered   Influenza-Unspecified 06/05/2018, 06/21/2021   PFIZER(Purple Top)SARS-COV-2 Vaccination 11/28/2019, 12/18/2019, 06/02/2020, 02/21/2021     Objective: Vital Signs: BP 129/78 (BP Location: Left Arm, Patient Position: Sitting, Cuff Size: Large)    Pulse 91    Ht _0  (1.575 m)    Wt 272 lb 6.4 oz (123.6 kg)    BMI 49.82 kg/m    Physical Exam Vitals and nursing note reviewed.  Constitutional:  Appearance: She is well-developed.  HENT:     Head: Normocephalic and atraumatic.  Eyes:     Conjunctiva/sclera: Conjunctivae normal.  Cardiovascular:     Rate and Rhythm: Normal rate and regular rhythm.     Heart sounds: Normal heart sounds.  Pulmonary:     Effort: Pulmonary effort is normal.     Breath sounds: Normal breath sounds.  Abdominal:     General: Bowel sounds are normal.     Palpations: Abdomen is soft.  Musculoskeletal:     Cervical back: Normal range of motion.  Lymphadenopathy:     Cervical: No cervical adenopathy.  Skin:    General: Skin is warm and dry.     Capillary Refill: Capillary refill takes less than 2 seconds.  Neurological:     Mental Status: She is alert and oriented to person, place, and time.  Psychiatric:        Behavior: Behavior normal.     Musculoskeletal Exam: C-spine was in good range of motion.  She has some discomfort with right lateral rotation.  Shoulder joints in good range of motion.  She has some tenderness on palpation over left shoulder, anterior aspect.  Elbow joints, wrist joints, MCPs PIPs and DIPs with good range of motion with no synovitis.  Hip joints with good range of motion.  She had tenderness over left trochanteric bursa.  Knee joints in good range of motion without any warmth swelling or effusion.  There was no tenderness over ankles or MTPs.  CDAI Exam: CDAI Score: -- Patient Global: --; Provider Global: -- Swollen: --; Tender:  -- Joint Exam 11/03/2021   No joint exam has been documented for this visit   There is currently no information documented on the homunculus. Go to the Rheumatology activity and complete the homunculus joint exam.  Investigation: No additional findings.  Imaging: No results found.  Recent Labs: Lab Results  Component Value Date   WBC 10.1 07/28/2021   HGB 12.2 07/28/2021   PLT 254 07/28/2021   NA 142 10/07/2021   K 4.5 10/07/2021   CL 108 (H) 10/07/2021   CO2 22 10/07/2021   GLUCOSE 91 10/07/2021   BUN 18 10/07/2021   CREATININE 0.90 10/07/2021   BILITOT 0.3 07/28/2021   ALKPHOS 119 06/27/2018   AST 12 07/28/2021   ALT 13 07/28/2021   PROT 6.8 07/28/2021   ALBUMIN 3.6 06/27/2018   CALCIUM 9.7 10/07/2021   GFRAA 79 01/20/2021   QFTBGOLDPLUS NEGATIVE 01/20/2021    Speciality Comments: Patient recieves Cosentyx through [employer] 04/29/2019  Procedures:  No procedures performed Allergies: Patient has no known allergies.   Assessment / Plan:     Visit Diagnoses: Spondyloarthropathy - MRI positive for sacroiliitis, HLA-B27 negative, elevated ESR: She is doing much better since she started Taltz.  She has been experiencing some discomfort in the SI joints and lower back which she relates to renovating her home.  She has been packing boxes.  She also has some left shoulder joint discomfort.  High risk medication use - Taltz 80 mg subcutaneous injections every 28 days. - Plan: CBC with Differential/Platelet, COMPLETE METABOLIC PANEL WITH GFR today and then every 3 months to monitor for drug toxicity.  She will get TB Gold with her next labs in May.  She has been advised to hold Taltz in case she develops an infection and resume once the infection resolves.  Information regarding mineralizations was also placed in the AVS.  Sacroiliitis (HCC)-she has  been experiencing increased discomfort in the SI joints recently due to renovating her  home.  Chronic left shoulder pain-she has some discomfort in her left shoulder which appears to be tendinopathy.  Stretching exercises were demonstrated.  DDD (degenerative disc disease), cervical-she has off-and-on discomfort in her cervical spine.  She had some discomfort at the lateral rotation.  Trochanteric bursitis, left hip -she had tenderness on palpation over left trochanteric bursa.  I would avoid cortisone injection as she is diabetic.  I offered physical therapy which she declined.  I gave her a handout on exercises.  H/O total knee replacement, bilateral-doing well.  Other medical problems are listed as follows:  Lung nodule seen on imaging study, CTA of chest, 6 mm nodule, followed by Dr. Virgina Jock  History of hypertension-her blood pressure is normal today.  She states spironolactone was recently added to her medication list which has been helpful.  Chronic diastolic heart failure (HCC)  History of TIA (transient ischemic attack)  History of hyperlipidemia  History of diabetes mellitus  History of sleep apnea  Orders: Orders Placed This Encounter  Procedures   CBC with Differential/Platelet   COMPLETE METABOLIC PANEL WITH GFR   No orders of the defined types were placed in this encounter.    Follow-Up Instructions: Return in about 5 months (around 04/02/2022) for Spondyloarthropathy.   Bo Merino, MD  Note - This record has been created using Editor, commissioning.  Chart creation errors have been sought, but may not always  have been located. Such creation errors do not reflect on  the standard of medical care.

## 2021-11-02 DIAGNOSIS — I5032 Chronic diastolic (congestive) heart failure: Secondary | ICD-10-CM | POA: Diagnosis not present

## 2021-11-02 DIAGNOSIS — I10 Essential (primary) hypertension: Secondary | ICD-10-CM | POA: Diagnosis not present

## 2021-11-02 DIAGNOSIS — E785 Hyperlipidemia, unspecified: Secondary | ICD-10-CM | POA: Diagnosis not present

## 2021-11-03 ENCOUNTER — Telehealth: Payer: Self-pay | Admitting: Cardiovascular Disease

## 2021-11-03 ENCOUNTER — Encounter: Payer: Self-pay | Admitting: Rheumatology

## 2021-11-03 ENCOUNTER — Other Ambulatory Visit: Payer: Self-pay

## 2021-11-03 ENCOUNTER — Ambulatory Visit: Payer: Medicare HMO | Admitting: Rheumatology

## 2021-11-03 VITALS — BP 129/78 | HR 91 | Ht 62.0 in | Wt 272.4 lb

## 2021-11-03 DIAGNOSIS — M47819 Spondylosis without myelopathy or radiculopathy, site unspecified: Secondary | ICD-10-CM

## 2021-11-03 DIAGNOSIS — M7062 Trochanteric bursitis, left hip: Secondary | ICD-10-CM

## 2021-11-03 DIAGNOSIS — M62838 Other muscle spasm: Secondary | ICD-10-CM

## 2021-11-03 DIAGNOSIS — M5030 Other cervical disc degeneration, unspecified cervical region: Secondary | ICD-10-CM

## 2021-11-03 DIAGNOSIS — M25512 Pain in left shoulder: Secondary | ICD-10-CM

## 2021-11-03 DIAGNOSIS — Z8669 Personal history of other diseases of the nervous system and sense organs: Secondary | ICD-10-CM

## 2021-11-03 DIAGNOSIS — Z96653 Presence of artificial knee joint, bilateral: Secondary | ICD-10-CM | POA: Diagnosis not present

## 2021-11-03 DIAGNOSIS — G8929 Other chronic pain: Secondary | ICD-10-CM

## 2021-11-03 DIAGNOSIS — R911 Solitary pulmonary nodule: Secondary | ICD-10-CM | POA: Diagnosis not present

## 2021-11-03 DIAGNOSIS — Z8679 Personal history of other diseases of the circulatory system: Secondary | ICD-10-CM | POA: Diagnosis not present

## 2021-11-03 DIAGNOSIS — Z8673 Personal history of transient ischemic attack (TIA), and cerebral infarction without residual deficits: Secondary | ICD-10-CM

## 2021-11-03 DIAGNOSIS — Z79899 Other long term (current) drug therapy: Secondary | ICD-10-CM

## 2021-11-03 DIAGNOSIS — M7702 Medial epicondylitis, left elbow: Secondary | ICD-10-CM

## 2021-11-03 DIAGNOSIS — I5032 Chronic diastolic (congestive) heart failure: Secondary | ICD-10-CM | POA: Diagnosis not present

## 2021-11-03 DIAGNOSIS — Z8639 Personal history of other endocrine, nutritional and metabolic disease: Secondary | ICD-10-CM

## 2021-11-03 DIAGNOSIS — M461 Sacroiliitis, not elsewhere classified: Secondary | ICD-10-CM | POA: Diagnosis not present

## 2021-11-03 MED ORDER — SPIRONOLACTONE 25 MG PO TABS: 12.5000 mg | ORAL_TABLET | Freq: Every day | ORAL | 3 refills | Status: DC

## 2021-11-03 NOTE — Patient Instructions (Addendum)
Standing Labs We placed an order today for your standing lab work.   Please have your standing labs drawn in May and every 3 months  Please get TB Gold with your next blood work  If possible, please have your labs drawn 2 weeks prior to your appointment so that the provider can discuss your results at your appointment.  Please note that you may see your imaging and lab results in [facility] before we have reviewed them. We may be awaiting multiple results to interpret others before contacting you. Please allow our office up to 72 hours to thoroughly review all of the results before contacting the office for clarification of your results.  We have open lab daily: Monday through Thursday from 1:30-4:30 PM and Friday from 1:30-4:00 PM at the office of Dr. Bo Merino, [facility].   Please be advised, all patients with office appointments requiring lab work will take precedent over walk-in lab work.  If possible, please come for your lab work on Monday and Friday afternoons, as you may experience shorter wait times. The office is located at [address] [place], [place], [place] [address] No appointment is necessary.   Labs are drawn by [facility]. Please bring your co-pay at the time of your lab draw.  You may receive a bill from [facility] for your lab work.  Please note if you are on Hydroxychloroquine and and an order has been placed for a Hydroxychloroquine level, you will need to have it drawn 4 hours or more after your last dose.  If you wish to have your labs drawn at another location, please call the office 24 hours in advance to send orders.  If you have any questions regarding directions or hours of operation,  please call [phone].   As a reminder, please drink plenty of water prior to coming for your lab work. Thanks!   Vaccines You are taking a medication(s) that can suppress your immune system.  The following immunizations are recommended: Flu  annually Covid-19  Td/Tdap (tetanus, diphtheria, pertussis) every 10 years Pneumonia (Prevnar 15 then Pneumovax 23 at least 1 year apart.  Alternatively, can take Prevnar 20 without needing additional dose) Shingrix: 2 doses from 4 weeks to 6 months apart  Please check with your PCP to make sure you are up to date.   If you have signs or symptoms of an infection or start antibiotics: First, call your PCP for workup of your infection. Hold your medication through the infection, until you complete your antibiotics, and until symptoms resolve if you take the following: Injectable medication (Actemra, Benlysta, Cimzia, Cosentyx, Enbrel, Humira, Kevzara, Orencia, Remicade, Simponi, Stelara, Taltz, Tremfya) Methotrexate Leflunomide (Arava) Mycophenolate (Cellcept) Roma Kayser, or Rinvoq  Iliotibial Band Syndrome Rehab Ask your health care provider which exercises are safe for you. Do exercises exactly as told by your health care provider and adjust them as directed. It is normal to feel mild stretching, pulling, tightness, or discomfort as you do these exercises. Stop right away if you feel sudden pain or your pain gets significantly worse. Do not begin these exercises until told by your health care provider. Stretching and range-of-motion exercises These exercises warm up your muscles and joints and improve the movement and flexibility of your hip and pelvis. Quadriceps stretch, prone  Lie on your abdomen (prone position) on a firm surface, such as a bed or padded floor. Bend your left / right knee and reach back to hold your ankle or pant leg. If  you cannot reach your ankle or pant leg, loop a belt around your foot and grab the belt instead. Gently pull your heel toward your buttocks. Your knee should not slide out to the side. You should feel a stretch in the front of your thigh and knee (quadriceps). Hold this position for __________ seconds. Repeat __________ times. Complete this  exercise __________ times a day. Iliotibial band stretch An iliotibial band is a strong band of muscle tissue that runs from the outer side of your hip to the outer side of your thigh and knee. Lie on your side with your left / right leg in the top position. Bend both of your knees and grab your left / right ankle. Stretch out your bottom arm to help you balance. Slowly bring your top knee back so your thigh goes behind your trunk. Slowly lower your top leg toward the floor until you feel a gentle stretch on the outside of your left / right hip and thigh. If you do not feel a stretch and your knee will not fall farther, place the heel of your other foot on top of your knee and pull your knee down toward the floor with your foot. Hold this position for __________ seconds. Repeat __________ times. Complete this exercise __________ times a day. Strengthening exercises These exercises build strength and endurance in your hip and pelvis. Endurance is the ability to use your muscles for a long time, even after they get tired. Straight leg raises, side-lying This exercise strengthens the muscles that rotate the leg at the hip and move it away from your body (hip abductors). Lie on your side with your left / right leg in the top position. Lie so your head, shoulder, hip, and knee line up. You may bend your bottom knee to help you balance. Roll your hips slightly forward so your hips are stacked directly over each other and your left / right knee is facing forward. Tense the muscles in your outer thigh and lift your top leg 4-6 inches (10-15 cm). Hold this position for __________ seconds. Slowly lower your leg to return to the starting position. Let your muscles relax completely before doing another repetition. Repeat __________ times. Complete this exercise __________ times a day. Leg raises, prone This exercise strengthens the muscles that move the hips backward (hip extensors). Lie on your abdomen  (prone position) on your bed or a firm surface. You can put a pillow under your hips if that is more comfortable for your lower back. Bend your left / right knee so your foot is straight up in the air. Squeeze your buttocks muscles and lift your left / right thigh off the bed. Do not let your back arch. Tense your thigh muscle as hard as you can without increasing any knee pain. Hold this position for __________ seconds. Slowly lower your leg to return to the starting position and allow it to relax completely. Repeat __________ times. Complete this exercise __________ times a day. Hip hike Stand sideways on a bottom step. Stand on your left / right leg with your other foot unsupported next to the step. You can hold on to a railing or wall for balance if needed. Keep your knees straight and your torso square. Then lift your left / right hip up toward the ceiling. Slowly let your left / right hip lower toward the floor, past the starting position. Your foot should get closer to the floor. Do not lean or bend your knees. Repeat  __________ times. Complete this exercise __________ times a day. This information is not intended to replace advice given to you by your health care provider. Make sure you discuss any questions you have with your health care provider. Document Revised: 11/06/2019 Document Reviewed: 11/06/2019 Elsevier Patient Education  [place].

## 2021-11-03 NOTE — Telephone Encounter (Signed)
Refill sent to pharmacy.   

## 2021-11-03 NOTE — Telephone Encounter (Signed)
° ° °*  STAT* If patient is at the pharmacy, call can be transferred to refill team.   1. Which medications need to be refilled? (please list name of each medication and dose if known) spironolactone (ALDACTONE) 25 MG tablet  2. Which pharmacy/location (including street and city if local pharmacy) is medication to be sent to? [facility] - [place], [place] - [place] [address]  3. Do they need a 30 day or 90 day supply? 90 days

## 2021-11-04 ENCOUNTER — Telehealth: Payer: Self-pay

## 2021-11-04 LAB — CBC WITH DIFFERENTIAL/PLATELET
Absolute Monocytes: 608 cells/uL (ref 200–950)
Basophils Absolute: 40 cells/uL (ref 0–200)
Basophils Relative: 0.5 %
Eosinophils Absolute: 320 cells/uL (ref 15–500)
Eosinophils Relative: 4 %
HCT: 38.4 % (ref 35.0–45.0)
Hemoglobin: 12.1 g/dL (ref 11.7–15.5)
Lymphs Abs: 2216 cells/uL (ref 850–3900)
MCH: 27.1 pg (ref 27.0–33.0)
MCHC: 31.5 g/dL — ABNORMAL LOW (ref 32.0–36.0)
MCV: 86.1 fL (ref 80.0–100.0)
MPV: 9.4 fL (ref 7.5–12.5)
Monocytes Relative: 7.6 %
Neutro Abs: 4816 cells/uL (ref 1500–7800)
Neutrophils Relative %: 60.2 %
Platelets: 223 10*3/uL (ref 140–400)
RBC: 4.46 10*6/uL (ref 3.80–5.10)
RDW: 14 % (ref 11.0–15.0)
Total Lymphocyte: 27.7 %
WBC: 8 10*3/uL (ref 3.8–10.8)

## 2021-11-04 LAB — COMPLETE METABOLIC PANEL WITH GFR
AG Ratio: 1.6 (calc) (ref 1.0–2.5)
ALT: 27 U/L (ref 6–29)
AST: 24 U/L (ref 10–35)
Albumin: 4.1 g/dL (ref 3.6–5.1)
Alkaline phosphatase (APISO): 82 U/L (ref 37–153)
BUN: 14 mg/dL (ref 7–25)
CO2: 26 mmol/L (ref 20–32)
Calcium: 9.4 mg/dL (ref 8.6–10.4)
Chloride: 111 mmol/L — ABNORMAL HIGH (ref 98–110)
Creat: 0.82 mg/dL (ref 0.50–1.05)
Globulin: 2.6 g/dL (calc) (ref 1.9–3.7)
Glucose, Bld: 106 mg/dL — ABNORMAL HIGH (ref 65–99)
Potassium: 4.7 mmol/L (ref 3.5–5.3)
Sodium: 139 mmol/L (ref 135–146)
Total Bilirubin: 0.3 mg/dL (ref 0.2–1.2)
Total Protein: 6.7 g/dL (ref 6.1–8.1)
eGFR: 82 mL/min/{1.73_m2} (ref >=60)

## 2021-11-04 NOTE — Telephone Encounter (Addendum)
Called patient regarding results. Patient had understanding of results---- Message from Warren Lacy, PA-C sent at 11/03/2021  9:50 PM EST ----- Heart monitor worn for 2 weeks and showed normal sinus rhythm.  No significant arrhythmias.  No atrial fibrillation.  Reassuring results.

## 2021-11-04 NOTE — Progress Notes (Signed)
CBC and CMP are normal.  Chloride is mildly elevated which is not significant.  We will continue to monitor labs.

## 2021-11-09 DIAGNOSIS — G4733 Obstructive sleep apnea (adult) (pediatric): Secondary | ICD-10-CM | POA: Diagnosis not present

## 2021-11-09 DIAGNOSIS — I129 Hypertensive chronic kidney disease with stage 1 through stage 4 chronic kidney disease, or unspecified chronic kidney disease: Secondary | ICD-10-CM | POA: Diagnosis not present

## 2021-11-09 DIAGNOSIS — E119 Type 2 diabetes mellitus without complications: Secondary | ICD-10-CM | POA: Diagnosis not present

## 2021-11-09 DIAGNOSIS — E785 Hyperlipidemia, unspecified: Secondary | ICD-10-CM | POA: Diagnosis not present

## 2021-11-09 DIAGNOSIS — N182 Chronic kidney disease, stage 2 (mild): Secondary | ICD-10-CM | POA: Diagnosis not present

## 2021-11-11 DIAGNOSIS — G43709 Chronic migraine without aura, not intractable, without status migrainosus: Secondary | ICD-10-CM | POA: Diagnosis not present

## 2021-11-30 NOTE — Progress Notes (Signed)
?Cardiology Office Note:   ? ?Date:  12/01/2021  ? ?ID:  [name], DOB [birth year], MRN [id number] ? ?PCP:  Shon Baton, MD ?[place] Cardiologist: Sanda Klein, MD  ? ?Reason for visit: 8-week follow-up ? ?History of Present Illness:   ? ?[name] is a 61 y.o. female with a hx of chronic diastolic CHF, morbid obesity, OSA, TIA, GERD, HTN, HLD, DM, asthma, anemia, former tobacco abuse, ankylosing spondylitis.   ?  ?She last saw me in October 07, 2021.  Spironolactone was added and potassium supplement was discontinued.  She takes irbesartan 75 mg half tablet as needed for systolic blood pressure over 130 in the mornings.  Zio patch was ordered for palpitations and irregular heartbeat noted on blood pressure monitor.  Zio patch showed NSR.   ? ?Today, she is doing well.  She has tolerated spironolactone well.  She shows me her blood pressure log with blood pressures 120s to 140s over 60s to 80s.  Blood pressure averages 130s over 80s.  She has lost 4 pounds since her last visit.  She has been 100% compliant with her CPAP and has benefited from use.  She has not followed through with the [facility] referral secondary to health issues with her family. ? ?She has stable dyspnea on moderate exertion.  She denies chest pain with exertion.  She has rare palpitations.  She denies PND, orthopnea, syncope and bleeding issues.  She has had to use Lasix only once since her last visit for leg swelling.  Her mobility is limited post knee replacements.   ? ?  ?Past Medical History:  ?Diagnosis Date  ? Anemia   ? takes iron supplement  ? Anxiety   ? Asthma   ? daily and prn inhalers  ? Bone spur   ? Left foot  ? Chest pain   ? a. 10/2015: NST showing possible mild ischemia but most consistent with breast attenuation. Low-risk study.   ? Chronic diastolic CHF (congestive heart failure) ([place])   ? Common migraine with intractable migraine 07/06/2018  ? DDD (degenerative disc disease), cervical   ?  Degenerative disc disease   ? Depression   ? Diabetes mellitus ([place])   ? Essential hypertension   ? Family history of early CAD   ? Former tobacco use   ? GERD (gastroesophageal reflux disease)   ? GERD (gastroesophageal reflux disease)   ? Headache(784.0)   ? migraine-like  ? History of TIA (transient ischemic attack) early 2013  ? no weakness or deficits  ? History of UTI   ? Hyperlipidemia   ? Insomnia   ? Migraines   ? Morbid obesity ([place])   ? Partial tear of rotator cuff(726.13)   ? Psoriatic arthritis ([place])   ? Shoulder impingement 04/2012  ? left  ? Sleep apnea   ? uses CPAP nightly  ? ? ?Past Surgical History:  ?Procedure Laterality Date  ? ABDOMINAL HYSTERECTOMY  03/28/2002  ? complete  ? ANTERIOR CERVICAL DECOMP/DISCECTOMY FUSION  02/18/2010  ? C6-7  ? BREAST BIOPSY Right   ? DILATION AND CURETTAGE OF UTERUS    ? 2000  ? FOOT SURGERY Right 2015  ? KNEE ARTHROPLASTY    ? ROTATOR CUFF REPAIR Left 2013  ? TOTAL KNEE ARTHROPLASTY  09/07/2009  ? left  ? TOTAL KNEE ARTHROPLASTY  10/13/2008  ? right  ? TOTAL SHOULDER ARTHROPLASTY    ? UMBILICAL HERNIA REPAIR  03/28/2002  ? [place]    ?  WRIST SURGERY  2020  ? ? ?Current Medications: ?Current Meds  ?Medication Sig  ? acidophilus (RISAQUAD) CAPS Take 1 capsule by mouth every morning.   ? albuterol (VENTOLIN HFA) 108 (90 Base) MCG/ACT inhaler 2 puff q 4-6 hours prn wheezes  ? ALPRAZolam (XANAX) 0.5 MG tablet Take 0.5 mg by mouth 2 (two) times daily as needed.  ? amoxicillin (AMOXIL) 500 MG capsule Take 2,000 mg by mouth as needed. Prior to dental work  ? aspirin 81 MG tablet Take 81 mg by mouth daily.  ? atorvastatin (LIPITOR) 20 MG tablet Take 20 mg by mouth every evening.   ? Azelastine HCl 0.15 % SOLN   ? baclofen (LIORESAL) 10 MG tablet Take 10-20 mg by mouth See admin instructions. Weekly as needed  ? calcium carbonate (TUMS E-X 750) 750 MG chewable tablet   ? chlorproMAZINE (THORAZINE) 25 MG tablet Take 25-50 mg by mouth See admin instructions. every 3 hours as needed  max twice month  ? dapagliflozin propanediol (FARXIGA) 10 MG TABS tablet Take 1 tablet (10 mg total) by mouth daily before breakfast.  ? dapagliflozin propanediol (FARXIGA) 10 MG TABS tablet Take 1 tablet (10 mg total) by mouth daily before breakfast.  ? diclofenac (VOLTAREN) 50 MG EC tablet Take 50 mg by mouth as needed.  ? diclofenac sodium (VOLTAREN) 1 % GEL Apply three grams to three large joints up to three times daily  ? DULoxetine (CYMBALTA) 60 MG capsule Take 60 mg by mouth daily.   ? ferrous sulfate 325 (65 FE) MG tablet Take 325 mg by mouth 2 (two) times a week.  ? furosemide (LASIX) 40 MG tablet TAKE 1 TABLET BY MOUTH DAILY (Patient taking differently: 20 mg as needed.)  ? HYDROcodone-acetaminophen (NORCO/VICODIN) 5-325 MG per tablet Take 1 tablet by mouth as needed for moderate pain.   ? hydrOXYzine (ATARAX/VISTARIL) 25 MG tablet Take 25 mg by mouth as directed.  ? irbesartan (AVAPRO) 75 MG tablet Take 37.5 mg by mouth. Take only if blood pressure above 130  ? Ixekizumab (TALTZ) 80 MG/ML SOAJ INJECT '80MG'$  (1 PEN) UNDER THE SKIN EVERY 4 WEEKS  ? loratadine (CLARITIN) 10 MG tablet Take 10 mg by mouth daily.  ? metFORMIN (GLUCOPHAGE) 500 MG tablet Take 500 mg by mouth daily with breakfast.  ? methocarbamol (ROBAXIN) 750 MG tablet 2 tabs up to 3xd as needed for muscle spasm  ? Multiple Vitamin (MULITIVITAMIN WITH MINERALS) TABS Take 1 tablet by mouth daily.  ? mupirocin nasal ointment (BACTROBAN) 2 % Place 1 application. into the nose 2 (two) times daily. Use one-half of tube in each . After application, press sides of nose together and gently massage. As Needed  ? NON FORMULARY at bedtime. CPAP  ? Olopatadine HCl (PATADAY OP) Place 1 drop into both eyes as needed.   ? ONE TOUCH ULTRA TEST test strip   ? ONETOUCH DELICA LANCETS 08Q MISC   ? pantoprazole (PROTONIX) 40 MG tablet Take 40 mg by mouth daily.  ? spironolactone (ALDACTONE) 25 MG tablet Take 0.5 tablets (12.5 mg total) by mouth daily.  ? topiramate  (TOPAMAX) 200 MG tablet Take 200 mg by mouth daily. Take 2 tablets daily at bedtime  ? vitamin B-12 (CYANOCOBALAMIN) 500 MCG tablet Take 500 mcg by mouth daily as needed.   ? [DISCONTINUED] topiramate (TOPAMAX) 100 MG tablet Take 200 mg by mouth at bedtime. Takes 2 tabs daily at bedtime TOTAL '400MG'$   ?  ? ?Allergies:   Patient has no  known allergies.  ? ?Social History  ? ?Socioeconomic History  ? Marital status: Married  ?  Spouse name: Jenny Reichmann  ? Number of children: 5  ? Years of education: some college  ? Highest education level: Not on file  ?Occupational History  ? Occupation: Disabled  ?Tobacco Use  ? Smoking status: Former  ?  Packs/day: 0.50  ?  Years: 20.00  ?  Pack years: 10.00  ?  Types: Cigarettes  ?  Quit date: 10/02/1999  ?  Years since quitting: 22.1  ?  Passive exposure: Past  ? Smokeless tobacco: Never  ?Vaping Use  ? Vaping Use: Never used  ?Substance and Sexual Activity  ? Alcohol use: No  ? Drug use: No  ? Sexual activity: Yes  ?  Birth control/protection: None  ?Other Topics Concern  ? Not on file  ?Social History Narrative  ? Lives with husband, Jenny Reichmann  ? Caffeine use: 1 cup coffee every morning  ? Right handed   ? ?Social Determinants of Health  ? ?Financial Resource Strain: Not on file  ?Food Insecurity: Not on file  ?Transportation Needs: Not on file  ?Physical Activity: Not on file  ?Stress: Not on file  ?Social Connections: Not on file  ?  ? ?Family History: ?The patient's family history includes Breast cancer in her sister and sister; Cancer in her maternal grandmother and paternal grandfather; Coronary artery disease in her sister; Crohn's disease in her cousin, cousin, maternal aunt, and paternal aunt; Diabetes in her father, maternal grandmother, mother, paternal grandfather, sister, and sister; Hypertension in her father, maternal grandmother, and mother; Kidney disease in her father. ? ?ROS:   ?Please see the history of present illness.    ? ?EKGs/Labs/Other Studies Reviewed:   ? ?Recent  Labs: ?11/03/2021: ALT 27; BUN 14; Creat 0.82; Hemoglobin 12.1; Platelets 223; Potassium 4.7; Sodium 139  ? ?Recent Lipid Panel ?Lab Results  ?Component Value Date/Time  ? CHOL 143 03/12/2013 02:00 AM  ? T

## 2021-12-01 ENCOUNTER — Encounter: Payer: Self-pay | Admitting: Physician Assistant

## 2021-12-01 ENCOUNTER — Other Ambulatory Visit: Payer: Self-pay

## 2021-12-01 ENCOUNTER — Ambulatory Visit (INDEPENDENT_AMBULATORY_CARE_PROVIDER_SITE_OTHER): Payer: Medicare HMO | Admitting: Physician Assistant

## 2021-12-01 VITALS — BP 130/80 | HR 72 | Ht 62.0 in | Wt 268.8 lb

## 2021-12-01 DIAGNOSIS — R002 Palpitations: Secondary | ICD-10-CM | POA: Diagnosis not present

## 2021-12-01 DIAGNOSIS — I5032 Chronic diastolic (congestive) heart failure: Secondary | ICD-10-CM

## 2021-12-01 DIAGNOSIS — I10 Essential (primary) hypertension: Secondary | ICD-10-CM

## 2021-12-01 DIAGNOSIS — E785 Hyperlipidemia, unspecified: Secondary | ICD-10-CM

## 2021-12-01 MED ORDER — DAPAGLIFLOZIN PROPANEDIOL 10 MG PO TABS: 10.0000 mg | ORAL_TABLET | Freq: Every day | ORAL | 0 refills | Status: DC

## 2021-12-01 MED ORDER — DAPAGLIFLOZIN PROPANEDIOL 10 MG PO TABS: 10.0000 mg | ORAL_TABLET | Freq: Every day | ORAL | 4 refills | Status: AC

## 2021-12-01 NOTE — Patient Instructions (Signed)
Medication Instructions:  ?Start Farxiga 10 mg ( Take 1 Tablet Daily). ?*If you need a refill on your cardiac medications before your next appointment, please call your pharmacy* ? ? ?Lab Work: ?Art gallery manager. To Be Done in 4 Weeks. ?If you have labs (blood work) drawn today and your tests are completely normal, you will receive your results only by: ?MyChart Message (if you have MyChart) OR ?A paper copy in the mail ?If you have any lab test that is abnormal or we need to change your treatment, we will call you to review the results. ? ? ?Testing/Procedures: ?Caron Presume PA-C  has ordered a CT coronary calcium score.  ? ?Test locations:  ?[facility] ([address] [place], [place] [address]) ?[facility] ([address] [place],  [address])  ? ?This is $99 out of pocket. ? ? ?Coronary CalciumScan ?A coronary calcium scan is an imaging test used to look for deposits of calcium and other fatty materials (plaques) in the inner lining of the blood vessels of the heart (coronary arteries). These deposits of calcium and plaques can partly clog and narrow the coronary arteries without producing any symptoms or warning signs. This puts a person at risk for a heart attack. This test can detect these deposits before symptoms develop. ?Tell a health care provider about: ?Any allergies you have. ?All medicines you are taking, including vitamins, herbs, eye drops, creams, and over-the-counter medicines. ?Any problems you or family members have had with anesthetic medicines. ?Any blood disorders you have. ?Any surgeries you have had. ?Any medical conditions you have. ?Whether you are pregnant or may be pregnant. ?What are the risks? ?Generally, this is a safe procedure. However, problems may occur, including: ?Harm to a pregnant woman and her unborn baby. This test involves the use of radiation. Radiation exposure can be dangerous to a pregnant woman and her unborn baby. If you are pregnant, you generally  should not have this procedure done. ?Slight increase in the risk of cancer. This is because of the radiation involved in the test. ?What happens before the procedure? ?No preparation is needed for this procedure. ?What happens during the procedure? ?You will undress and remove any jewelry around your neck or chest. ?You will put on a hospital gown. ?Sticky electrodes will be placed on your chest. The electrodes will be connected to an electrocardiogram (ECG) machine to record a tracing of the electrical activity of your heart. ?A CT scanner will take pictures of your heart. During this time, you will be asked to lie still and hold your breath for 2-3 seconds while a picture of your heart is being taken. ?The procedure may vary among health care providers and hospitals. ?What happens after the procedure? ?You can get dressed. ?You can return to your normal activities. ?It is up to you to get the results of your test. Ask your health care provider, or the department that is doing the test, when your results will be ready. ?Summary ?A coronary calcium scan is an imaging test used to look for deposits of calcium and other fatty materials (plaques) in the inner lining of the blood vessels of the heart (coronary arteries). ?Generally, this is a safe procedure. Tell your health care provider if you are pregnant or may be pregnant. ?No preparation is needed for this procedure. ?A CT scanner will take pictures of your heart. ?You can return to your normal activities after the scan is done. ?This information is not intended to replace advice given to  you by your health care provider. Make sure you discuss any questions you have with your health care provider. ?Document Released: 02/25/2008 Document Revised: 07/18/2016 Document Reviewed: 07/18/2016 ?Elsevier Interactive Patient Education ? 2017 Elsevier Inc.  ? ? ?Follow-Up: ?At [facility], you and your health needs are our priority.  As part of our continuing mission to  provide you with exceptional heart care, we have created designated Provider Care Teams.  These Care Teams include your primary Cardiologist (physician) and Advanced Practice Providers (APPs -  Physician Assistants and Nurse Practitioners) who all work together to provide you with the care you need, when you need it. ? ?We recommend signing up for the patient portal called "MyChart".  Sign up information is provided on this After Visit Summary.  MyChart is used to connect with patients for Virtual Visits (Telemedicine).  Patients are able to view lab/test results, encounter notes, upcoming appointments, etc.  Non-urgent messages can be sent to your provider as well.   ?To learn more about what you can do with MyChart, go to [email or web address].   ? ?Your next appointment:   ?July 2023 ? ?The format for your next appointment:   ?In Person ? ?Provider:   ?Sanda Klein, MD   ? ? ? ?

## 2021-12-02 ENCOUNTER — Telehealth: Payer: Self-pay | Admitting: Rheumatology

## 2021-12-02 NOTE — Telephone Encounter (Signed)
Patient left a voicemail requesting a call back from [facility] regarding her Taltz medication. [phone] ?

## 2021-12-03 ENCOUNTER — Telehealth: Payer: Self-pay | Admitting: *Deleted

## 2021-12-03 NOTE — Telephone Encounter (Signed)
Medication Samples have been provided to the patient. ? ?Drug name: Taltz       Strength: 80 mg        Qty: 1  LOT: [id number]  Exp.Date: May 12, 2023 ? ?Dosing instructions: Inject one pen into skin every 4 weeks.   ?

## 2021-12-06 NOTE — Telephone Encounter (Signed)
Returned call to patient - she states she lost number to [employer] and phone number se called was specific to Trulicity. I advised that she has to wait on th eline and listen for prompt for Olumiant, Forteo, and Taltz and that wait time may be in excess of 30 minutes. She verbalized understanding. She picked up sample of Taltz last Friday ? ?Knox Saliva, PharmD, MPH, BCPS ?Clinical Pharmacist (Rheumatology and Pulmonology) ?

## 2021-12-10 DIAGNOSIS — E785 Hyperlipidemia, unspecified: Secondary | ICD-10-CM | POA: Diagnosis not present

## 2021-12-10 DIAGNOSIS — N182 Chronic kidney disease, stage 2 (mild): Secondary | ICD-10-CM | POA: Diagnosis not present

## 2021-12-10 DIAGNOSIS — E119 Type 2 diabetes mellitus without complications: Secondary | ICD-10-CM | POA: Diagnosis not present

## 2021-12-10 DIAGNOSIS — G4733 Obstructive sleep apnea (adult) (pediatric): Secondary | ICD-10-CM | POA: Diagnosis not present

## 2021-12-10 DIAGNOSIS — I129 Hypertensive chronic kidney disease with stage 1 through stage 4 chronic kidney disease, or unspecified chronic kidney disease: Secondary | ICD-10-CM | POA: Diagnosis not present

## 2021-12-20 DIAGNOSIS — Z780 Asymptomatic menopausal state: Secondary | ICD-10-CM | POA: Diagnosis not present

## 2021-12-27 IMAGING — CT CT HEAD W/O CM
3 of 4 series · 15 of 47 positions shown, 18 images · non-contrast
Comparison: July 17, 2018

CLINICAL DATA: Fall

EXAM:
CT HEAD WITHOUT CONTRAST
TECHNIQUE: Contiguous axial images were obtained from the base of the skull
through the vertex without intravenous contrast.

[Series 4: head 2.0 h70h · axial · 0.41mm/px · z∈[-126,+2]mm · 9 of 80 slices shown, 12 images]
[im 8/80  brain]
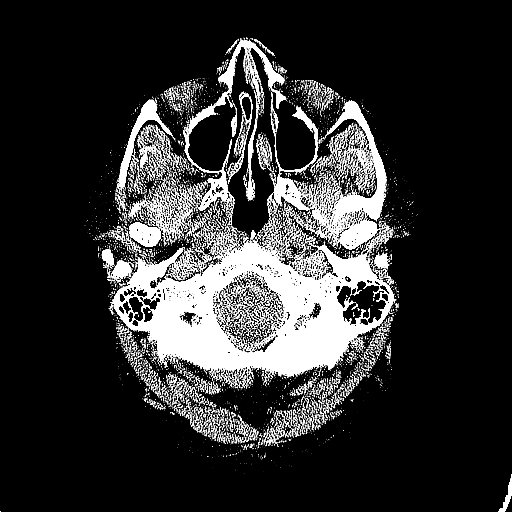
[im 8/80  bone]
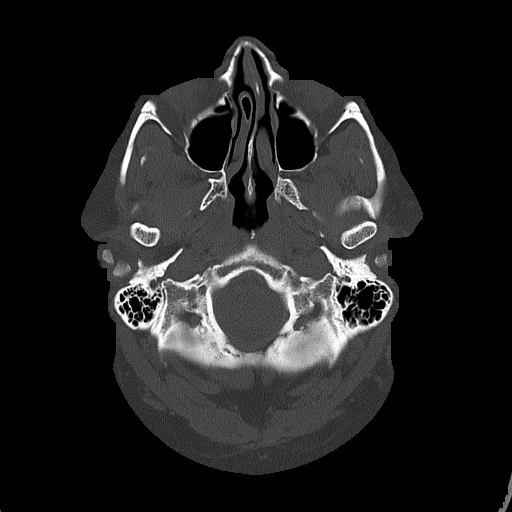
[im 16/80  brain]
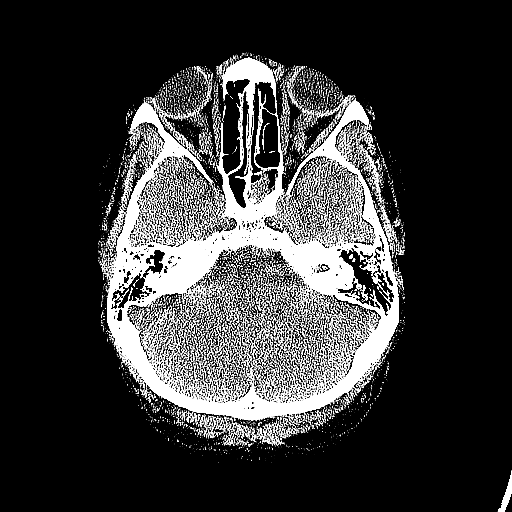
[im 24/80  brain]
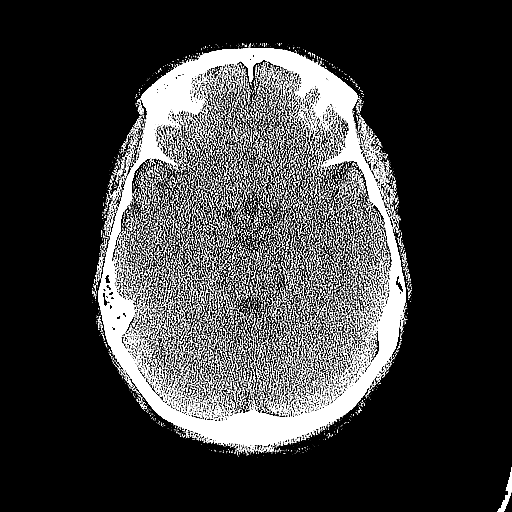
[im 32/80  brain]
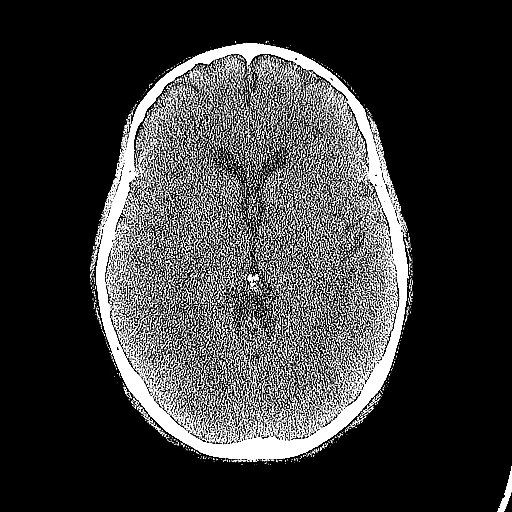
[im 40/80  brain]
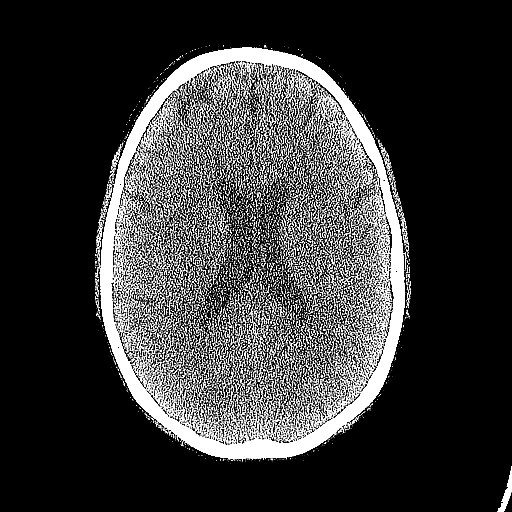
[im 40/80  bone]
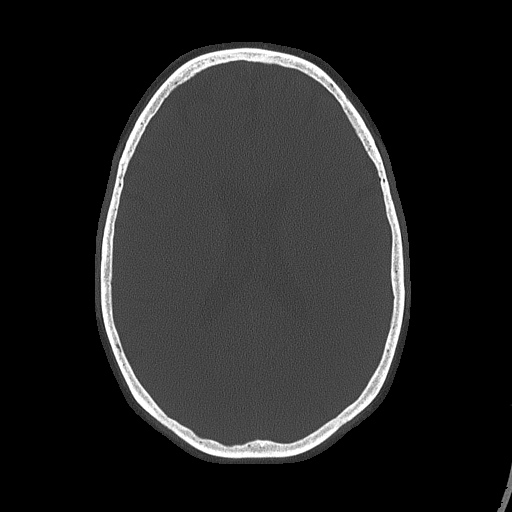
[im 48/80  brain]
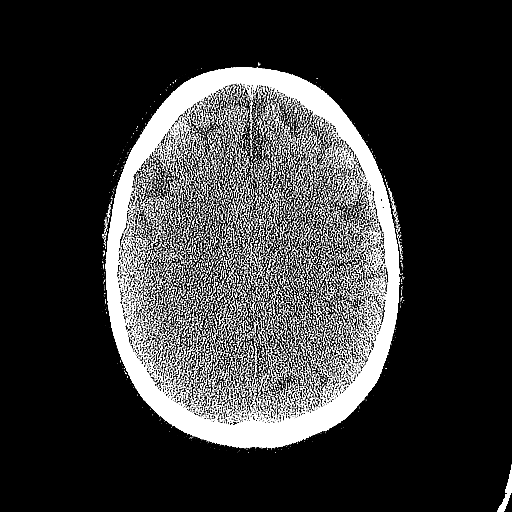
[im 56/80  brain]
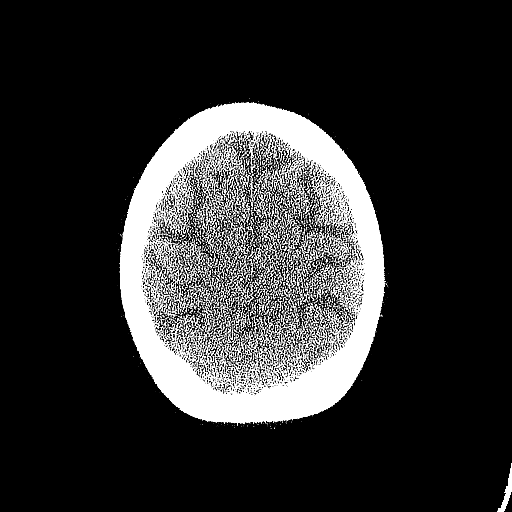
[im 64/80  brain]
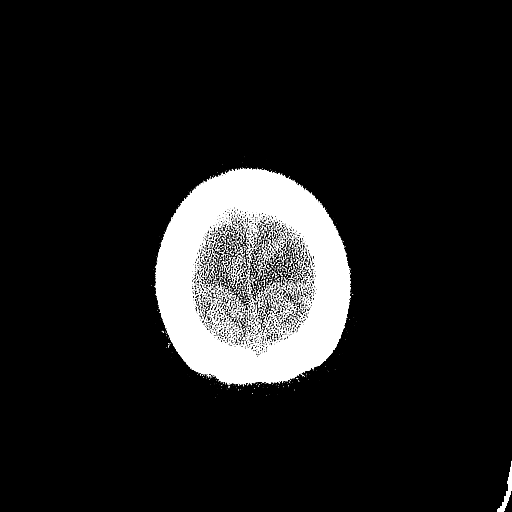
[im 72/80  brain]
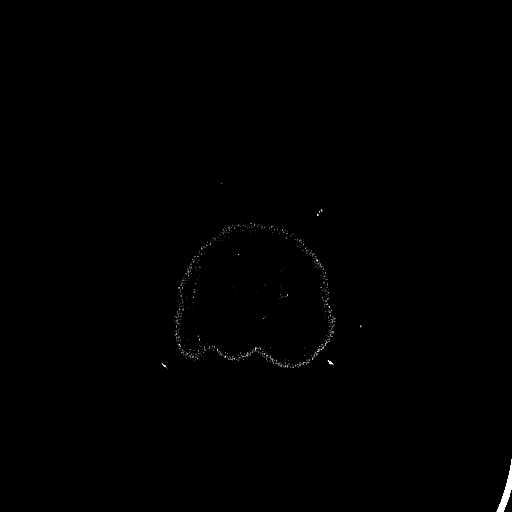
[im 72/80  bone]
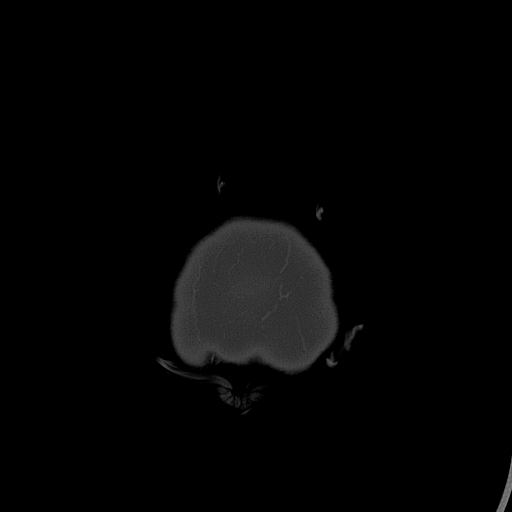

[Series 5: head 3.0 mpr cor · coronal · 0.46mm/px · 3 of 75 slices shown]
[im 25/75  brain]
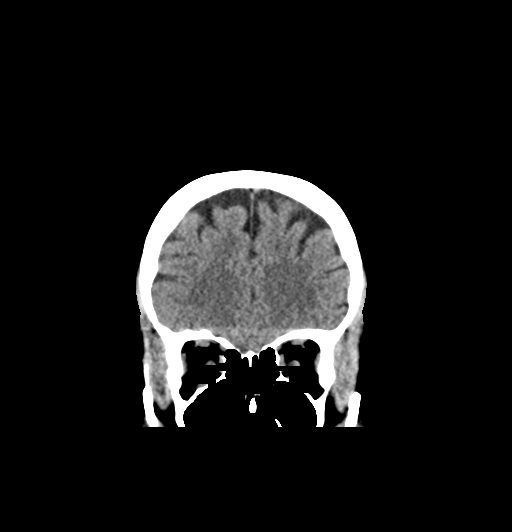
[im 33/75  brain]
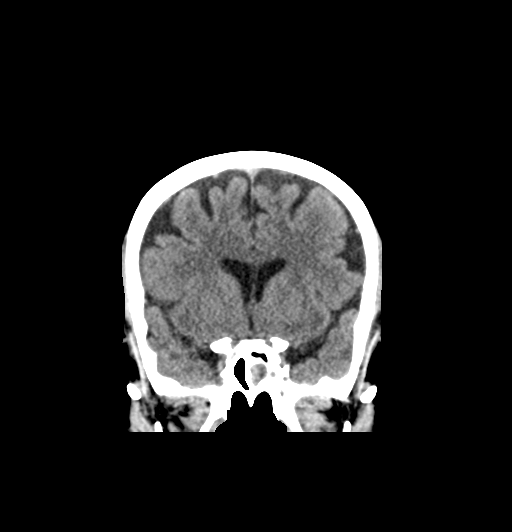
[im 42/75  brain]
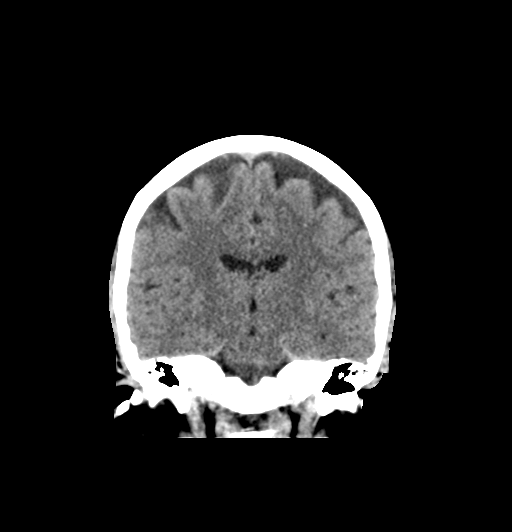

[Series 6: head 3.0 mpr sag · sagittal · 0.31mm/px · 3 of 67 slices shown]
[im 23/67  brain]
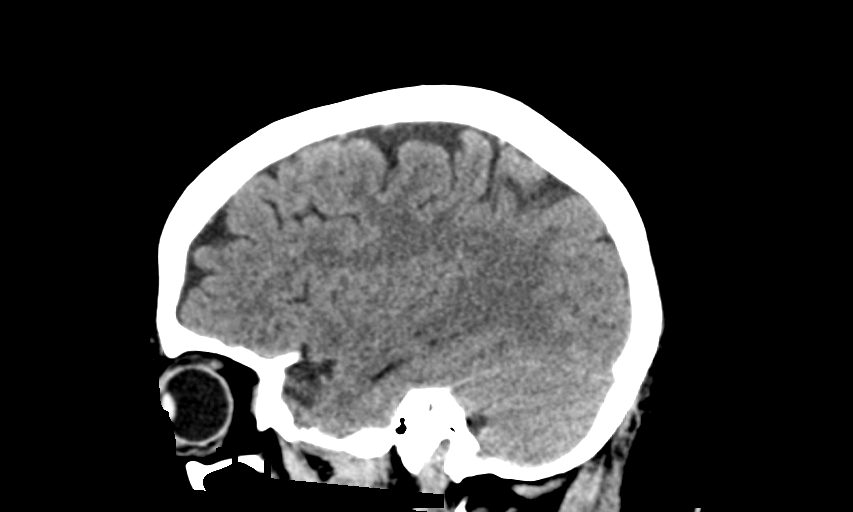
[im 34/67  brain]
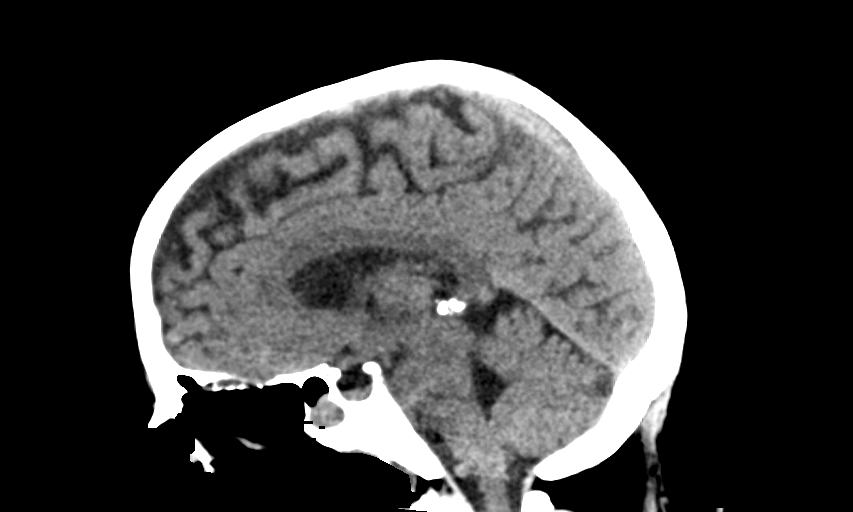
[im 45/67  brain]
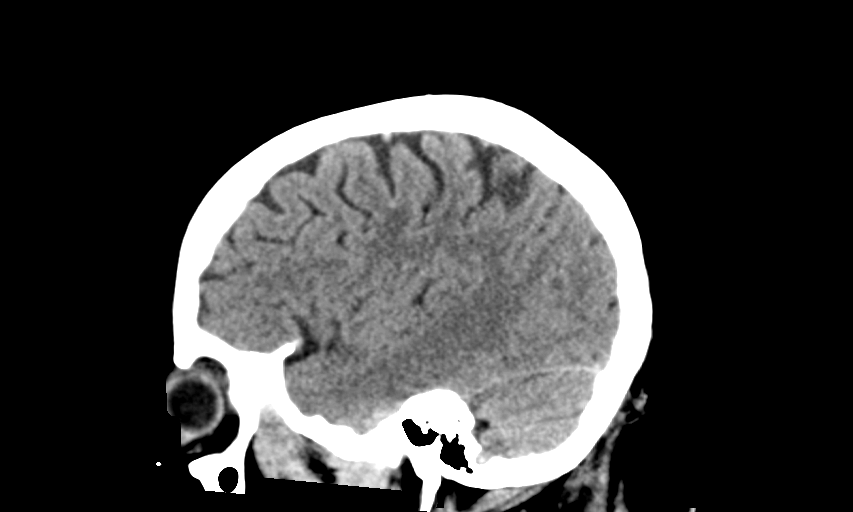

[15 of 47 positions shown; findings below may reference images not displayed]

FINDINGS: Brain: No evidence of acute territorial infarction, hemorrhage,
hydrocephalus,extra-axial collection or mass lesion/mass effect.
Normal gray-white differentiation. Ventricles are normal in size and
contour.

Vascular: No hyperdense vessel or unexpected calcification.

Skull: The skull is intact. No fracture or focal lesion identified.

Sinuses/Orbits: Near complete opacification of the left sphenoid
sinus. The orbits and globes intact.

Other: None
IMPRESSION: No acute intracranial abnormality.

Left sphenoid sinus chronic sinusitis

## 2021-12-29 DIAGNOSIS — I10 Essential (primary) hypertension: Secondary | ICD-10-CM | POA: Diagnosis not present

## 2021-12-29 DIAGNOSIS — I5032 Chronic diastolic (congestive) heart failure: Secondary | ICD-10-CM | POA: Diagnosis not present

## 2021-12-29 DIAGNOSIS — E785 Hyperlipidemia, unspecified: Secondary | ICD-10-CM | POA: Diagnosis not present

## 2021-12-29 DIAGNOSIS — R002 Palpitations: Secondary | ICD-10-CM | POA: Diagnosis not present

## 2021-12-29 LAB — BASIC METABOLIC PANEL
BUN/Creatinine Ratio: 17 (ref 12–28)
BUN: 17 mg/dL (ref 8–27)
CO2: 22 mmol/L (ref 20–29)
Calcium: 10.2 mg/dL (ref 8.7–10.3)
Chloride: 105 mmol/L (ref 96–106)
Creatinine, Ser: 1.02 mg/dL — ABNORMAL HIGH (ref 0.57–1.00)
Glucose: 81 mg/dL (ref 70–99)
Potassium: 4.2 mmol/L (ref 3.5–5.2)
Sodium: 139 mmol/L (ref 134–144)
eGFR: 63 mL/min/{1.73_m2} (ref >59)

## 2021-12-31 ENCOUNTER — Other Ambulatory Visit: Payer: Self-pay | Admitting: Rheumatology

## 2021-12-31 DIAGNOSIS — M47819 Spondylosis without myelopathy or radiculopathy, site unspecified: Secondary | ICD-10-CM

## 2021-12-31 MED ORDER — TALTZ 80 MG/ML ~~LOC~~ SOAJ: SUBCUTANEOUS | 0 refills | Status: DC

## 2021-12-31 NOTE — Telephone Encounter (Signed)
Left message to advise patient she is due to update labs next month.  ?

## 2021-12-31 NOTE — Telephone Encounter (Signed)
Patient called the office requesting a refill of Taltz to be sent to [facility]. Patient due to take medication next week. ?

## 2021-12-31 NOTE — Telephone Encounter (Signed)
Pt due for updated lab work including TB gold in may.

## 2021-12-31 NOTE — Telephone Encounter (Addendum)
Next Visit: 04/07/2022 ? ?Last Visit: 11/03/2021 ? ?Labs: 11/03/2021 CBC and CMP are normal.  Chloride is mildly elevated which is not significant ? ?TB Gold: 01/20/2021 neg  ? ?Current Dose per office note 11/03/2021: Taltz 80 mg subcutaneous injections every 28 days. ? ?DX: Spondyloarthropathy  ? ?Last Fill: 09/07/2021 ? ?Okay to refill [facility]ail?  ?

## 2022-01-03 DIAGNOSIS — J45909 Unspecified asthma, uncomplicated: Secondary | ICD-10-CM | POA: Diagnosis not present

## 2022-01-03 DIAGNOSIS — R1310 Dysphagia, unspecified: Secondary | ICD-10-CM | POA: Diagnosis not present

## 2022-01-03 DIAGNOSIS — R0989 Other specified symptoms and signs involving the circulatory and respiratory systems: Secondary | ICD-10-CM | POA: Diagnosis not present

## 2022-01-06 ENCOUNTER — Telehealth: Payer: Self-pay

## 2022-01-06 NOTE — Telephone Encounter (Addendum)
Called patient regarding results.patient had understanding of results.----- Message from Warren Lacy, PA-C sent at 12/30/2021 12:49 PM EDT ----- ?Electrolytes normal.  Kidney function stable.  Continue Farxiga. ?

## 2022-01-06 NOTE — Telephone Encounter (Addendum)
Called patient regarding results. Patient had understanding of results.----- Message from Warren Lacy, PA-C sent at 12/30/2021 12:49 PM EDT ----- ?Electrolytes normal.  Kidney function stable.  Continue Farxiga. ?

## 2022-01-09 DIAGNOSIS — G4733 Obstructive sleep apnea (adult) (pediatric): Secondary | ICD-10-CM | POA: Diagnosis not present

## 2022-01-10 ENCOUNTER — Other Ambulatory Visit: Payer: Self-pay | Admitting: *Deleted

## 2022-01-10 DIAGNOSIS — Z79899 Other long term (current) drug therapy: Secondary | ICD-10-CM

## 2022-01-12 LAB — CBC WITH DIFFERENTIAL/PLATELET
Absolute Monocytes: 497 cells/uL (ref 200–950)
Basophils Absolute: 43 cells/uL (ref 0–200)
Basophils Relative: 0.6 %
Eosinophils Absolute: 302 cells/uL (ref 15–500)
Eosinophils Relative: 4.2 %
HCT: 38.3 % (ref 35.0–45.0)
Hemoglobin: 12.1 g/dL (ref 11.7–15.5)
Lymphs Abs: 1908 cells/uL (ref 850–3900)
MCH: 26.9 pg — ABNORMAL LOW (ref 27.0–33.0)
MCHC: 31.6 g/dL — ABNORMAL LOW (ref 32.0–36.0)
MCV: 85.1 fL (ref 80.0–100.0)
MPV: 9.7 fL (ref 7.5–12.5)
Monocytes Relative: 6.9 %
Neutro Abs: 4450 cells/uL (ref 1500–7800)
Neutrophils Relative %: 61.8 %
Platelets: 240 10*3/uL (ref 140–400)
RBC: 4.5 10*6/uL (ref 3.80–5.10)
RDW: 13.6 % (ref 11.0–15.0)
Total Lymphocyte: 26.5 %
WBC: 7.2 10*3/uL (ref 3.8–10.8)

## 2022-01-12 LAB — COMPLETE METABOLIC PANEL WITH GFR
AG Ratio: 1.4 (calc) (ref 1.0–2.5)
ALT: 16 U/L (ref 6–29)
AST: 14 U/L (ref 10–35)
Albumin: 4 g/dL (ref 3.6–5.1)
Alkaline phosphatase (APISO): 92 U/L (ref 37–153)
BUN: 12 mg/dL (ref 7–25)
CO2: 24 mmol/L (ref 20–32)
Calcium: 9.2 mg/dL (ref 8.6–10.4)
Chloride: 112 mmol/L — ABNORMAL HIGH (ref 98–110)
Creat: 0.93 mg/dL (ref 0.50–1.05)
Globulin: 2.9 g/dL (calc) (ref 1.9–3.7)
Glucose, Bld: 100 mg/dL — ABNORMAL HIGH (ref 65–99)
Potassium: 4.8 mmol/L (ref 3.5–5.3)
Sodium: 142 mmol/L (ref 135–146)
Total Bilirubin: 0.3 mg/dL (ref 0.2–1.2)
Total Protein: 6.9 g/dL (ref 6.1–8.1)
eGFR: 70 mL/min/{1.73_m2} (ref >=60)

## 2022-01-12 LAB — QUANTIFERON-TB GOLD PLUS
Mitogen-NIL: >10 IU/mL
NIL: 0.04 IU/mL
QuantiFERON-TB Gold Plus: NEGATIVE
TB1-NIL: 0 IU/mL
TB2-NIL: <0 IU/mL

## 2022-01-13 ENCOUNTER — Ambulatory Visit (INDEPENDENT_AMBULATORY_CARE_PROVIDER_SITE_OTHER)
Admission: RE | Admit: 2022-01-13 | Discharge: 2022-01-13 | Disposition: A | Discharge status: Home / Self Care | Payer: Self-pay | Source: Ambulatory Visit | Attending: Physician Assistant | Admitting: Physician Assistant

## 2022-01-13 DIAGNOSIS — I5032 Chronic diastolic (congestive) heart failure: Secondary | ICD-10-CM

## 2022-01-13 DIAGNOSIS — E785 Hyperlipidemia, unspecified: Secondary | ICD-10-CM

## 2022-01-13 DIAGNOSIS — R002 Palpitations: Secondary | ICD-10-CM

## 2022-01-13 DIAGNOSIS — I10 Essential (primary) hypertension: Secondary | ICD-10-CM

## 2022-01-13 NOTE — Progress Notes (Signed)
TB Gold is negative.

## 2022-01-19 ENCOUNTER — Telehealth: Payer: Self-pay

## 2022-01-19 NOTE — Telephone Encounter (Addendum)
Called patient regarding results. Left message for patient to call office.----- Message from Warren Lacy, PA-C sent at 01/18/2022  1:55 PM EDT ----- ?Coronary calcium score of 0.  This means you have very low risk of coronary artery disease.  Very reassuring!   We can keep your Lipitor dose at 20 mg daily. ?

## 2022-01-25 ENCOUNTER — Telehealth: Payer: Self-pay

## 2022-01-25 NOTE — Telephone Encounter (Addendum)
Letter mailed to patient regarding results.----- Message from Warren Lacy, PA-C sent at 01/18/2022  1:55 PM EDT ----- ?Coronary calcium score of 0.  This means you have very low risk of coronary artery disease.  Very reassuring!   We can keep your Lipitor dose at 20 mg daily. ?

## 2022-02-03 DIAGNOSIS — H5203 Hypermetropia, bilateral: Secondary | ICD-10-CM | POA: Diagnosis not present

## 2022-02-03 DIAGNOSIS — H52223 Regular astigmatism, bilateral: Secondary | ICD-10-CM | POA: Diagnosis not present

## 2022-02-03 DIAGNOSIS — H524 Presbyopia: Secondary | ICD-10-CM | POA: Diagnosis not present

## 2022-02-03 DIAGNOSIS — E119 Type 2 diabetes mellitus without complications: Secondary | ICD-10-CM | POA: Diagnosis not present

## 2022-02-08 DIAGNOSIS — M791 Myalgia, unspecified site: Secondary | ICD-10-CM | POA: Diagnosis not present

## 2022-02-08 DIAGNOSIS — G518 Other disorders of facial nerve: Secondary | ICD-10-CM | POA: Diagnosis not present

## 2022-02-08 DIAGNOSIS — M542 Cervicalgia: Secondary | ICD-10-CM | POA: Diagnosis not present

## 2022-02-08 DIAGNOSIS — G43709 Chronic migraine without aura, not intractable, without status migrainosus: Secondary | ICD-10-CM | POA: Diagnosis not present

## 2022-02-09 DIAGNOSIS — E785 Hyperlipidemia, unspecified: Secondary | ICD-10-CM | POA: Diagnosis not present

## 2022-02-09 DIAGNOSIS — G4733 Obstructive sleep apnea (adult) (pediatric): Secondary | ICD-10-CM | POA: Diagnosis not present

## 2022-02-09 DIAGNOSIS — N182 Chronic kidney disease, stage 2 (mild): Secondary | ICD-10-CM | POA: Diagnosis not present

## 2022-02-09 DIAGNOSIS — I129 Hypertensive chronic kidney disease with stage 1 through stage 4 chronic kidney disease, or unspecified chronic kidney disease: Secondary | ICD-10-CM | POA: Diagnosis not present

## 2022-02-09 DIAGNOSIS — E119 Type 2 diabetes mellitus without complications: Secondary | ICD-10-CM | POA: Diagnosis not present

## 2022-03-12 ENCOUNTER — Other Ambulatory Visit: Payer: Self-pay | Admitting: Physician Assistant

## 2022-03-12 DIAGNOSIS — M47819 Spondylosis without myelopathy or radiculopathy, site unspecified: Secondary | ICD-10-CM

## 2022-03-14 NOTE — Telephone Encounter (Signed)
Next Visit: 04/07/2022  Last Visit: 2/222023  Last Fill: 12/31/2021  YF:[id number]   Current Dose per office note 11/03/2021: Taltz 80 mg subcutaneous injections every 28 days  Labs: 01/10/2022, CBC and CMP stable.   TB Gold: 01/10/2022  TB Gold is negative  Okay to refill Taltz?

## 2022-03-24 ENCOUNTER — Ambulatory Visit (INDEPENDENT_AMBULATORY_CARE_PROVIDER_SITE_OTHER): Payer: Medicare HMO | Admitting: Cardiovascular Disease

## 2022-03-24 ENCOUNTER — Encounter: Payer: Self-pay | Admitting: Cardiovascular Disease

## 2022-03-24 VITALS — BP 140/86 | HR 78 | Ht 62.0 in | Wt 265.0 lb

## 2022-03-24 DIAGNOSIS — E1169 Type 2 diabetes mellitus with other specified complication: Secondary | ICD-10-CM | POA: Diagnosis not present

## 2022-03-24 DIAGNOSIS — I5032 Chronic diastolic (congestive) heart failure: Secondary | ICD-10-CM

## 2022-03-24 DIAGNOSIS — E7800 Pure hypercholesterolemia, unspecified: Secondary | ICD-10-CM | POA: Diagnosis not present

## 2022-03-24 DIAGNOSIS — E669 Obesity, unspecified: Secondary | ICD-10-CM

## 2022-03-24 DIAGNOSIS — I10 Essential (primary) hypertension: Secondary | ICD-10-CM

## 2022-03-24 DIAGNOSIS — G4733 Obstructive sleep apnea (adult) (pediatric): Secondary | ICD-10-CM

## 2022-03-24 NOTE — Progress Notes (Signed)
Patient ID: [name], female   DOB: May 15, 1961, 61 y.o.   MRN: [id number]    Cardiology Office Note    Date:  03/29/2022   ID:  [name], DOB [birth year], MRN [id number]  PCP:  Shon Baton, MD  Cardiologist:   Sanda Klein, MD   No chief complaint on file.   History of Present Illness:  [name] is a 61 y.o. female with history of chronic diastolic CHF, morbid obesity, OSA, TIA, GERD, HTN, HLD, DM, asthma, anemia, former tobacco abuse, ankylosing spondylitis who presents for f/u.  She has had an unremarkable year without any cardiovascular events.  Continues to have NYHA functional class II dyspnea, but has not had orthopnea or PND and only rarely has lower extremity edema (she takes furosemide 40 mg as needed, on average once weekly).  She has not had dizziness, palpitations or syncope.  Her blood pressure is borderline elevated today but is usually much lower when she checks it at home , typically 120-130/70-80 range. She continues to take diclofenac.  Most recent lipid profile showed an LDL cholesterol of 96 and HDL of 44 and her most recent hemoglobin A1c was 5.9%.  She has normal renal function.    Making slow progress with weight loss.  Has lost 7 pounds this year.  BMI remains in the morbidly obese range at 48  Past Medical History:  Diagnosis Date   Anemia    takes iron supplement   Anxiety    Asthma    daily and prn inhalers   Bone spur    Left foot   Chest pain    a. 10/2015: NST showing possible mild ischemia but most consistent with breast attenuation. Low-risk study.    Chronic diastolic CHF (congestive heart failure) (HCC)    Common migraine with intractable migraine 07/06/2018   DDD (degenerative disc disease), cervical    Degenerative disc disease    Depression    Diabetes mellitus ([place])    Essential hypertension    Family history of early CAD    Former tobacco use    GERD (gastroesophageal reflux disease)    GERD  (gastroesophageal reflux disease)    Headache(784.0)    migraine-like   History of TIA (transient ischemic attack) early 2013   no weakness or deficits   History of UTI    Hyperlipidemia    Insomnia    Migraines    Morbid obesity ([place])    Partial tear of rotator cuff(726.13)    Psoriatic arthritis ([place])    Shoulder impingement 04/2012   left   Sleep apnea    uses CPAP nightly    Past Surgical History:  Procedure Laterality Date   ABDOMINAL HYSTERECTOMY  03/28/2002   complete   ANTERIOR CERVICAL DECOMP/DISCECTOMY FUSION  02/18/2010   C6-7   BREAST BIOPSY Right    DILATION AND CURETTAGE OF UTERUS     2000   FOOT SURGERY Right 2015   KNEE ARTHROPLASTY     ROTATOR CUFF REPAIR Left 2013   TOTAL KNEE ARTHROPLASTY  09/07/2009   left   TOTAL KNEE ARTHROPLASTY  10/13/2008   right   TOTAL SHOULDER ARTHROPLASTY     UMBILICAL HERNIA REPAIR  03/28/2002   wisidom     WRIST SURGERY  2020    Outpatient Medications Prior to Visit  Medication Sig Dispense Refill   acidophilus (RISAQUAD) CAPS Take 1 capsule by mouth every morning.      albuterol (VENTOLIN HFA) 108 (90  Base) MCG/ACT inhaler 2 puff q 4-6 hours prn wheezes     ALPRAZolam (XANAX) 0.5 MG tablet Take 0.5 mg by mouth 2 (two) times daily as needed.     aspirin 81 MG tablet Take 81 mg by mouth daily.     atorvastatin (LIPITOR) 20 MG tablet Take 20 mg by mouth every evening.      Azelastine HCl 0.15 % SOLN      baclofen (LIORESAL) 10 MG tablet Take 10-20 mg by mouth See admin instructions. Weekly as needed     calcium carbonate (TUMS E-X 750) 750 MG chewable tablet      chlorproMAZINE (THORAZINE) 25 MG tablet Take 25-50 mg by mouth See admin instructions. every 3 hours as needed max twice month  1   dapagliflozin propanediol (FARXIGA) 10 MG TABS tablet Take 1 tablet (10 mg total) by mouth daily before breakfast. 30 tablet 4   diclofenac sodium (VOLTAREN) 1 % GEL Apply three grams to three large joints up to three times daily 3 Tube 3    DULoxetine (CYMBALTA) 60 MG capsule Take 60 mg by mouth daily.   3   ferrous sulfate 325 (65 FE) MG tablet Take 325 mg by mouth 2 (two) times a week.     HYDROcodone-acetaminophen (NORCO/VICODIN) 5-325 MG per tablet Take 1 tablet by mouth as needed for moderate pain.      irbesartan (AVAPRO) 75 MG tablet Take 37.5 mg by mouth. Take only if blood pressure above 130     loratadine (CLARITIN) 10 MG tablet Take 10 mg by mouth daily.     metFORMIN (GLUCOPHAGE) 500 MG tablet Take 500 mg by mouth daily with breakfast.     methocarbamol (ROBAXIN) 750 MG tablet 2 tabs up to 3xd as needed for muscle spasm     Multiple Vitamin (MULITIVITAMIN WITH MINERALS) TABS Take 1 tablet by mouth daily.     NON FORMULARY at bedtime. CPAP     ONE TOUCH ULTRA TEST test strip      ONETOUCH DELICA LANCETS 54M MISC      pantoprazole (PROTONIX) 40 MG tablet Take 40 mg by mouth daily.     spironolactone (ALDACTONE) 25 MG tablet Take 0.5 tablets (12.5 mg total) by mouth daily. 30 tablet 3   TALTZ 80 MG/ML SOAJ INJECT '80MG'$  (1 PEN) UNDER THE SKIN EVERY 4 WEEKS 3 mL 0   topiramate (TOPAMAX) 200 MG tablet Take 200 mg by mouth daily. Take 2 tablets daily at bedtime     vitamin B-12 (CYANOCOBALAMIN) 500 MCG tablet Take 500 mcg by mouth daily.     amoxicillin (AMOXIL) 500 MG capsule Take 2,000 mg by mouth as needed. Prior to dental work     diclofenac (CATAFLAM) 50 MG tablet Take 50 mg by mouth 2 (two) times daily.     diclofenac (VOLTAREN) 50 MG EC tablet Take 50 mg by mouth daily in the afternoon.     furosemide (LASIX) 40 MG tablet TAKE 1 TABLET BY MOUTH DAILY 90 tablet 3   hydrOXYzine (ATARAX/VISTARIL) 25 MG tablet Take 25 mg by mouth as directed.     mupirocin nasal ointment (BACTROBAN) 2 % Place 1 application  into the nose 2 (two) times daily. Use one-half of tube in each . After application, press sides of nose together and gently massage. As Needed     Olopatadine HCl (PATADAY OP) Place 1 drop into both eyes as needed.      dapagliflozin propanediol (FARXIGA) 10 MG TABS tablet  Take 1 tablet (10 mg total) by mouth daily before breakfast. (Patient not taking: Reported on 03/29/2022) 14 tablet 0   No facility-administered medications prior to visit.     Allergies:   Patient has no known allergies.   Social History   Socioeconomic History   Marital status: Married    Spouse name: John   Number of children: 5   Years of education: some college   Highest education level: Not on file  Occupational History   Occupation: Disabled  Tobacco Use   Smoking status: Former    Packs/day: 0.50    Years: 20.00    Total pack years: 10.00    Types: Cigarettes    Quit date: 10/02/1999    Years since quitting: 22.5    Passive exposure: Past   Smokeless tobacco: Never  Vaping Use   Vaping Use: Never used  Substance and Sexual Activity   Alcohol use: No   Drug use: No   Sexual activity: Yes    Birth control/protection: None  Other Topics Concern   Not on file  Social History Narrative   Lives with husband, John   Caffeine use: 1 cup coffee every morning   Right handed    Social Determinants of Health   Financial Resource Strain: Not on file  Food Insecurity: Not on file  Transportation Needs: Not on file  Physical Activity: Not on file  Stress: Not on file  Social Connections: Not on file     Family History:  The patient's family history includes Breast cancer in her sister and sister; Cancer in her maternal grandmother and paternal grandfather; Coronary artery disease in her sister; Crohn's disease in her cousin, cousin, maternal aunt, and paternal aunt; Diabetes in her father, maternal grandmother, mother, paternal grandfather, sister, and sister; Hypertension in her father, maternal grandmother, and mother; Kidney disease in her father.   ROS:   Please see the history of present illness.    ROS All other systems are reviewed and are negative.   PHYSICAL EXAM:   VS:  BP 140/86 (BP Location: Left  Arm, Patient Position: Sitting, Cuff Size: Large)   Pulse 78   Ht '5\' 2"'$  (1.575 m)   Wt 265 lb (120.2 kg)   SpO2 96%   BMI 48.47 kg/m      General: Alert, oriented x3, no distress, morbidly obese Head: no evidence of trauma, PERRL, EOMI, no exophtalmos or lid lag, no myxedema, no xanthelasma; normal ears, nose and oropharynx Neck: normal jugular venous pulsations and no hepatojugular reflux; brisk carotid pulses without delay and no carotid bruits Chest: clear to auscultation, no signs of consolidation by percussion or palpation, normal fremitus, symmetrical and full respiratory excursions Cardiovascular: normal position and quality of the apical impulse, regular rhythm, normal first and second heart sounds, no murmurs, rubs or gallops Abdomen: no tenderness or distention, no masses by palpation, no abnormal pulsatility or arterial bruits, normal bowel sounds, no hepatosplenomegaly Extremities: no clubbing, cyanosis or edema; 2+ radial, ulnar and brachial pulses bilaterally; 2+ right femoral, posterior tibial and dorsalis pedis pulses; 2+ left femoral, posterior tibial and dorsalis pedis pulses; no subclavian or femoral bruits Neurological: grossly nonfocal Psych: Normal mood and affect    Wt Readings from Last 3 Encounters:  03/29/22 265 lb 12.8 oz (120.6 kg)  03/24/22 265 lb (120.2 kg)  12/01/21 268 lb 12.8 oz (121.9 kg)      Studies/Labs Reviewed:   EKG:  EKG was ordered today shows normal sinus rhythm,  normal tracing Recent Labs: 01/10/2022: ALT 16; BUN 12; Creat 0.93; Hemoglobin 12.1; Platelets 240; Potassium 4.8; Sodium 142   Lipid Panel    Component Value Date/Time   CHOL 143 03/12/2013 0200   TRIG 128 03/12/2013 0200   HDL 42 03/12/2013 0200   CHOLHDL 3.4 03/12/2013 0200   VLDL 26 03/12/2013 0200   LDLCALC 75 03/12/2013 0200   10/11/2021 Cholesterol 154, HDL 44, LDL 96, triglycerides 72 Hemoglobin A1c 5.9%  ASSESSMENT:    1. Chronic diastolic heart failure ([place])    2. Essential hypertension   3. Morbid obesity ([place])   4. OSA (obstructive sleep apnea)   5. Hypercholesterolemia   6. Diabetes mellitus type 2 in obese (HCC)      PLAN:  In order of problems listed above:  CHF: Euvolemic, only takes diuretics intermittently.  NYHA functional class II.  On SGLT2 inhibitor. HTN: Well-controlled (albeit with some whitecoat hypertension), abnormal consistently less than 130/80. Obesity: Small progress with weight loss.  Reports 100% compliance in the OSA: Has 100% compliance with CPAP, denies daytime hypersomnolence HLP: Target LDL less than 100 (no known CAD or PAD).  Continue statin. DM: Very well controlled.  W[place] Glade should also be continued for heart failure.    Medication Adjustments/Labs and Tests Ordered: Current medicines are reviewed at length with the patient today.  Concerns regarding medicines are outlined above.  Medication changes, Labs and Tests ordered today are listed in the Patient Instructions below. Patient Instructions  Medication Instructions:  No changes *If you need a refill on your cardiac medications before your next appointment, please call your pharmacy*   Lab Work: None ordered If you have labs (blood work) drawn today and your tests are completely normal, you will receive your results only by: [place] (if you have MyChart) OR A paper copy in the mail If you have any lab test that is abnormal or we need to change your treatment, we will call you to review the results.   Testing/Procedures: None ordered   Follow-Up: At [facility], you and your health needs are our priority.  As part of our continuing mission to provide you with exceptional heart care, we have created designated Provider Care Teams.  These Care Teams include your primary Cardiologist (physician) and Advanced Practice Providers (APPs -  Physician Assistants and Nurse Practitioners) who all work together to provide you with the care you  need, when you need it.  We recommend signing up for the patient portal called "MyChart".  Sign up information is provided on this After Visit Summary.  MyChart is used to connect with patients for Virtual Visits (Telemedicine).  Patients are able to view lab/test results, encounter notes, upcoming appointments, etc.  Non-urgent messages can be sent to your provider as well.   To learn more about what you can do with MyChart, go to [email or web address].    Your next appointment:   12 month(s)  The format for your next appointment:   In Person  Provider:   Sanda Klein, MD {   Important Information About Sugar           Signed, Sanda Klein, MD  03/29/2022 11:41 PM    [address], [place], [place]  [address] Phone: [phone]; Fax: [phone]

## 2022-03-24 NOTE — Patient Instructions (Signed)

## 2022-03-24 NOTE — Progress Notes (Unsigned)
Office Visit Note  Patient: [name]             Date of Birth: Jan 13, 1961           MRN: [id number]             PCP: Shon Baton, MD Referring: Shon Baton, MD Visit Date: 04/07/2022 Occupation: _0 @  Subjective:  Medication monitoring  History of Present Illness: [name] is a 61 y.o. female with history of spondyloarthropathy and DDD.  She is on taltz 80 mg sq injections every 28 days.  She is tolerating Taltz without any side effects or injection site reactions.  She has not missed any doses recently.  She denies any recent or recurrent infections.  She denies any signs or symptoms of a flare.  She has intermittent discomfort in her shoulders and applies Voltaren gel topically as needed for pain relief.  She states that her SI joint pain has improved significantly since switching to [employer].  She denies any groin pain at this time.  She has occasional discomfort along the plantar fascia but denies any Achilles tendinitis at this time.  She denies any joint swelling.  She states overall her knee replacements are doing well.  She states that she has started walking on a daily basis for exercise which she has enjoyed.  She denies any eye pain or inflammation. She denies any new medical conditions.     Activities of Daily Living:  Patient reports morning stiffness for 0 minutes.   Patient Reports nocturnal pain.  Difficulty dressing/grooming: Denies Difficulty climbing stairs: Denies Difficulty getting out of chair: Denies Difficulty using hands for taps, buttons, cutlery, and/or writing: Denies  Review of Systems  Constitutional:  Positive for fatigue.  HENT:  Positive for mouth dryness. Negative for mouth sores and nose dryness.   Eyes:  Negative for pain, visual disturbance and dryness.  Respiratory:  Negative for cough, hemoptysis and difficulty breathing.   Cardiovascular:  Negative for chest pain, palpitations, hypertension and swelling in  legs/feet.  Gastrointestinal:  Positive for constipation. Negative for blood in stool and diarrhea.  Endocrine: Negative for increased urination.  Genitourinary:  Negative for painful urination and involuntary urination.  Musculoskeletal:  Positive for joint pain and joint pain. Negative for joint swelling, myalgias, muscle weakness, morning stiffness, muscle tenderness and myalgias.  Skin:  Negative for color change, pallor, rash, hair loss, nodules/bumps, skin tightness, ulcers and sensitivity to sunlight.  Allergic/Immunologic: Negative for susceptible to infections.  Neurological:  Negative for dizziness, numbness, headaches and weakness.  Hematological:  Negative for swollen glands.  Psychiatric/Behavioral:  Negative for depressed mood and sleep disturbance. The patient is not nervous/anxious.     PMFS History:  Patient Active Problem List   Diagnosis Date Noted   Tennis Must Quervain's disease (radial styloid tenosynovitis) 11/27/2018   Vertigo 10/18/2018   Common migraine with intractable migraine 07/06/2018   Diabetes mellitus type 2 in obese ([place]) 06/29/2018   Spondyloarthropathy 07/26/2016   High risk medication use 07/26/2016   Chronic low back pain 07/26/2016   H/O total knee replacement, bilateral 07/26/2016   Chronic diastolic heart failure ([place]) 11/29/2015   Dyspnea 06/03/2015   Lung nodule seen on imaging study, CTA of chest, 6 mm nodule, followed by Dr. Virgina Jock 03/25/2013   Family history of coronary artery disease 03/12/2013   Super obese 03/12/2013   Chest pain with low risk of acute coronary syndrome - most likely musculoskeletal, negative nuc study 03/11/2013   Full  thickness rotator cuff tear 05/15/2012   Hypercholesterolemia    DJD (degenerative joint disease), cervical    OSA (obstructive sleep apnea)    Essential hypertension    History of TIA (transient ischemic attack) 9/13    Partial tear of rotator cuff(726.13)    Shoulder impingement 04/12/2012    Past Medical  History:  Diagnosis Date   Anemia    takes iron supplement   Anxiety    Asthma    daily and prn inhalers   Bone spur    Left foot   Chest pain    a. 10/2015: NST showing possible mild ischemia but most consistent with breast attenuation. Low-risk study.    Chronic diastolic CHF (congestive heart failure) (HCC)    Common migraine with intractable migraine 07/06/2018   DDD (degenerative disc disease), cervical    Degenerative disc disease    Depression    Diabetes mellitus ([place])    Essential hypertension    Family history of early CAD    Former tobacco use    GERD (gastroesophageal reflux disease)    GERD (gastroesophageal reflux disease)    Headache(784.0)    migraine-like   History of TIA (transient ischemic attack) early 2013   no weakness or deficits   History of UTI    Hyperlipidemia    Insomnia    Migraines    Morbid obesity ([place])    Partial tear of rotator cuff(726.13)    Psoriatic arthritis ([place])    Shoulder impingement 04/2012   left   Sleep apnea    uses CPAP nightly    Family History  Problem Relation Age of Onset   Diabetes Mother    Hypertension Mother    Kidney disease Father    Hypertension Father    Diabetes Father    Breast cancer Sister    Diabetes Sister    Breast cancer Sister    Cancer Maternal Grandmother    Diabetes Maternal Grandmother    Hypertension Maternal Grandmother    Diabetes Paternal Grandfather    Cancer Paternal Grandfather    Coronary artery disease Sister    Diabetes Sister    Crohn's disease Maternal Aunt    Crohn's disease Cousin    Crohn's disease Cousin    Crohn's disease Paternal Aunt    Past Surgical History:  Procedure Laterality Date   ABDOMINAL HYSTERECTOMY  03/28/2002   complete   ANTERIOR CERVICAL DECOMP/DISCECTOMY FUSION  02/18/2010   C6-7   BREAST BIOPSY Right    DILATION AND CURETTAGE OF UTERUS     2000   FOOT SURGERY Right 2015   KNEE ARTHROPLASTY     ROTATOR CUFF REPAIR Left 2013   TOTAL KNEE  ARTHROPLASTY  09/07/2009   left   TOTAL KNEE ARTHROPLASTY  10/13/2008   right   TOTAL SHOULDER ARTHROPLASTY     UMBILICAL HERNIA REPAIR  03/28/2002   [facility]     WRIST SURGERY  2020   Social History   Social History Narrative   Lives with husband, John   Caffeine use: 1 cup coffee every morning   Right handed    Immunization History  Administered Date(s) Administered   Influenza-Unspecified 06/05/2018, 06/21/2021   PFIZER(Purple Top)SARS-COV-2 Vaccination 11/28/2019, 12/18/2019, 06/02/2020, 02/21/2021     Objective: Vital Signs: BP 120/78 (BP Location: Left Arm, Patient Position: Sitting, Cuff Size: Large)   Pulse 82   Ht _0  (1.575 m)   Wt 264 lb (119.7 kg)   BMI 48.29 kg/m  Physical Exam Vitals and nursing note reviewed.  Constitutional:      Appearance: She is well-developed.  HENT:     Head: Normocephalic and atraumatic.  Eyes:     Conjunctiva/sclera: Conjunctivae normal.  Cardiovascular:     Rate and Rhythm: Normal rate and regular rhythm.     Heart sounds: Normal heart sounds.  Pulmonary:     Effort: Pulmonary effort is normal.     Breath sounds: Normal breath sounds.  Abdominal:     General: Bowel sounds are normal.     Palpations: Abdomen is soft.  Musculoskeletal:     Cervical back: Normal range of motion.  Skin:    General: Skin is warm and dry.     Capillary Refill: Capillary refill takes less than 2 seconds.  Neurological:     Mental Status: She is alert and oriented to person, place, and time.  Psychiatric:        Behavior: Behavior normal.      Musculoskeletal Exam: C-spine, thoracic spine, lumbar spine have good range of motion.  No midline spinal tenderness.  No SI joint tenderness upon palpation.  Shoulder joints have good range of motion with some crepitus in the left shoulder.  Elbow joints, wrist joints, MCPs, PIPs, DIPs have good range of motion with no synovitis.  She was able to make a complete fist bilaterally.  Hip joints have good  range of motion with no groin pain.  Bilateral knee replacements have good range of motion with no warmth or effusion.  Ankle joints have good range of motion with no tenderness or joint swelling.  No evidence of Achilles tendinitis.  CDAI Exam: CDAI Score: -- Patient Global: --; Provider Global: -- Swollen: --; Tender: -- Joint Exam 04/07/2022   No joint exam has been documented for this visit   There is currently no information documented on the homunculus. Go to the Rheumatology activity and complete the homunculus joint exam.  Investigation: No additional findings.  Imaging: No results found.  Recent Labs: Lab Results  Component Value Date   WBC 7.2 01/10/2022   HGB 12.1 01/10/2022   PLT 240 01/10/2022   NA 142 01/10/2022   K 4.8 01/10/2022   CL 112 (H) 01/10/2022   CO2 24 01/10/2022   GLUCOSE 100 (H) 01/10/2022   BUN 12 01/10/2022   CREATININE 0.93 01/10/2022   BILITOT 0.3 01/10/2022   ALKPHOS 119 06/27/2018   AST 14 01/10/2022   ALT 16 01/10/2022   PROT 6.9 01/10/2022   ALBUMIN 3.6 06/27/2018   CALCIUM 9.2 01/10/2022   GFRAA 79 01/20/2021   QFTBGOLDPLUS NEGATIVE 01/10/2022    Speciality Comments: Patient recieves Cosentyx through [employer] 04/29/2019  Procedures:  No procedures performed Allergies: Patient has no known allergies.   Assessment / Plan:     Visit Diagnoses: Spondyloarthropathy - MRI positive for sacroiliitis, HLA-B27 negative, elevated ESR: She has not had any signs or symptoms of a flare.  She has clinically been doing well on Taltz 80 mg subcutaneous injections once every 28 days.  She has not been experiencing any morning stiffness.  She has started walking every morning for exercise which she has enjoyed.  She has not experienced any SI joint pain and has no tenderness upon palpation today.  She has good range of motion of both hip joints with no groin pain.  No midline spinal tenderness at this time.  No  synovitis or dactylitis noted.  No evidence of Achilles tendinitis or  plantar fasciitis.  No signs or symptoms of uveitis.  Discussed the importance of remaining active.  She will continue on Taltz as prescribed.  She was advised to notify us if she develops signs or symptoms of a flare.  She will follow-up in the office in 5 months or sooner if needed.  High risk medication use - Taltz 80 mg subcutaneous injections every 28 days.  - Plan: CBC with Differential/Platelet, COMPLETE METABOLIC PANEL WITH GFR CBC and CMP updated on 01/10/22. Orders for CBC and CMP Released today.  Her next lab work will be due in October and every 3 months. Standing orders for CBC and CMP remain in place.  TB gold negative on 01/10/22. She has not had any recent or recurrent infections.  Discussed the importance of holding taltz if she develops signs or symptoms of an infection and to resume once the infection has completely cleared.  No new medical conditions.   Sacroiliitis [facility]): No SI joint tenderness upon palpation.  No nocturnal pain.    Chronic left shoulder pain: She has been experiencing intermittent discomfort in both shoulder joints.  Status post left shoulder rotator cuff repair in the past.  On examination she has good range of motion of both shoulder joints with some crepitus in the left shoulder.  She has been using Voltaren gel topically as needed which has been helpful.  DDD (degenerative disc disease), cervical: She has good range of motion with no discomfort at this time.  No symptoms of radiculopathy.  Trochanteric bursitis, left hip: Resolved  H/O total knee replacement, bilateral: Doing well.  She has good ROM of both knee replacements with no discomfort.  No warmth or effusion was noted.  She experiences some stiffness after sitting for prolonged periods of time.  She has started walking on a daily basis for exercise which she has enjoyed.  Other medical conditions are listed as follows:   Lung  nodule seen on imaging study, CTA of chest, 6 mm nodule, followed by Dr. Virgina Jock  History of hypertension - BP was 120/78 today in the office.   History of TIA (transient ischemic attack)  Chronic diastolic heart failure (HCC)  History of diabetes mellitus  History of sleep apnea  History of hyperlipidemia  Orders: Orders Placed This Encounter  Procedures   CBC with Differential/Platelet   COMPLETE METABOLIC PANEL WITH GFR   No orders of the defined types were placed in this encounter.    Follow-Up Instructions: Return in about 5 months (around 09/07/2022) for Spondyloarthropathy, DDD.   Ofilia Neas, PA-C  Note - This record has been created using Dragon software.  Chart creation errors have been sought, but may not always  have been located. Such creation errors do not reflect on  the standard of medical care.

## 2022-03-25 DIAGNOSIS — J383 Other diseases of vocal cords: Secondary | ICD-10-CM | POA: Diagnosis not present

## 2022-03-25 DIAGNOSIS — R062 Wheezing: Secondary | ICD-10-CM | POA: Diagnosis not present

## 2022-03-25 DIAGNOSIS — J310 Chronic rhinitis: Secondary | ICD-10-CM | POA: Diagnosis not present

## 2022-03-25 DIAGNOSIS — H1045 Other chronic allergic conjunctivitis: Secondary | ICD-10-CM | POA: Diagnosis not present

## 2022-03-29 ENCOUNTER — Ambulatory Visit (INDEPENDENT_AMBULATORY_CARE_PROVIDER_SITE_OTHER): Payer: Medicare HMO | Admitting: Cardiovascular Disease

## 2022-03-29 ENCOUNTER — Encounter: Payer: Self-pay | Admitting: Cardiovascular Disease

## 2022-03-29 VITALS — BP 138/68 | HR 86 | Ht 62.0 in | Wt 265.8 lb

## 2022-03-29 DIAGNOSIS — I10 Essential (primary) hypertension: Secondary | ICD-10-CM

## 2022-03-29 DIAGNOSIS — E1169 Type 2 diabetes mellitus with other specified complication: Secondary | ICD-10-CM

## 2022-03-29 DIAGNOSIS — E7800 Pure hypercholesterolemia, unspecified: Secondary | ICD-10-CM | POA: Diagnosis not present

## 2022-03-29 DIAGNOSIS — I5032 Chronic diastolic (congestive) heart failure: Secondary | ICD-10-CM | POA: Diagnosis not present

## 2022-03-29 DIAGNOSIS — G4733 Obstructive sleep apnea (adult) (pediatric): Secondary | ICD-10-CM | POA: Diagnosis not present

## 2022-03-29 DIAGNOSIS — E669 Obesity, unspecified: Secondary | ICD-10-CM

## 2022-03-29 NOTE — Progress Notes (Signed)
Cardiology Office Note    Date:  04/04/2022   ID:  [name], DOB 04-26-1961, MRN [id number]  PCP:  Shon Baton, MD  Cardiologist:  Shelva Majestic, MD (sleep); Dr. Orene Desanctis  12 month F/U Sleep evaluation   History of Present Illness:  [name] is a 61 y.o. female who is followed by Dr. Sallyanne Kuster for cardiology care.  I last saw her for a sleep evaluation on March 16, 2021.  She presents for yearly evaluation.    [name] has a history of obesity, hypertension, hyperlipidemia, as well as remote TIA.  She was diagnosed with obstructive sleep apnea in June 2012 and overall AHI was 8.9/h.  She underwent CPAP titration study and was titrated up to 13 cm of water pressure with excellent benefit.  In 2015, prior machine malfunction and she received a new ResMed air sense 10 CPAP auto unit.  She was using a full facemask.  When I saw her following obtaining her new machine in November 2015 compliance was excellent.  At 13 cm water pressure AHI was excellent at 0.8/h.  With CPAP therapy, she had resolution of prior snoring, nonrestorative sleep, I did not have any residual daytime sleepiness.  I saw her on November 12, 2020 after not having seen her since 2015.  Over the past 6 years she admits to continued CPAP use with excellent cotmpliance.  She recently had a message on her machine stating imaging "end-of-life" of the CPAP motor.  She she had called twice on medical, her DME company who advised that she needed to see me for sleep evaluation prior to getting a new machine.  At her March 2022 evaluation she admitted to issues with insomnia since her father passed away.  Oftentimes she goes to bed very rarely but often wakes up around 2 AM and may stay up for some time before going back to bed.  She is unaware of breakthrough snoring.  A new Epworth Sleepiness Scale score was calculated in the office today and this endorsed at 11 as shown below:   Epworth Sleepiness  Scale: Situation   Chance of Dozing/Sleeping (0 = never , 1 = slight chance , 2 = moderate chance , 3 = high chance )   sitting and reading 1   watching TV 3   sitting inactive in a public place 2   being a passenger in a motor vehicle for an hour or more 1   lying down in the afternoon 3   sitting and talking to someone 0   sitting quietly after lunch (no alcohol) 1   while stopped for a few minutes in traffic as the driver 0   Total Score  11   She was unaware of breakthrough snoring, bruxism, hypnagogic hallucinations or cataplectic events.    I last saw her on March 16, 2021.  Since her November 12, 2020 evaluation, she received a new ResMed air sense 11 AutoSet unit on Feb 09, 2021.  She notes that her machine is even more quiet than her previous one.  I was able to obtain a new download from February 15, 2019 through through July 6 12/2020.  Compliance is excellent with 100% use.  Average use is 8 hours and 44 minutes.  The unit is set at a minimum pressure of 12 with maximum of 18.  Her 95th percentile pressure is 15.1 with a maximum average pressure at 16.9.  AHI is excellent at 1.0.  She is unaware of  any breakthrough snoring.  She is sleeping well.  She denies residual daytime sleepiness.  An Epworth Sleepiness Scale score was recalculated in the office today and this endorsed at 5.    Since I last saw her, she has continued to be followed by Dr. Sallyanne Kuster and last saw him on March 24, 2022 and was stable from a cardiovascular standpoint.  Presently, she continues to use CPAP.  We obtained a download today from July 17 through March 27, 2022.  This confirms excellent compliance with 100% use and average use at 9 hours and 49 minutes.  She received a new ResMed air sense 11 unit on Feb 09, 2021.  Pressure settings are 12 to 18 cm of water.  AHI is excellent at 0.7.  Her 95th percentile pressure is 15.2 with maximum average pressure 16.8 cm of water.  She believes she is sleeping well.  She typically goes  to bed at 10 PM but wakes up between 430 and 5 AM.  She often takes approximately 2 naps per week.  An Epworth Sleepiness Scale score was calculated in the office today and this endorsed at 9.  At times she admits that she wakes up with a dry mouth.  She has had a difficult summer with her oldest son dying at age 5 on February 10, 2022 resulting from pneumonia.  Also her husband had fallen in June.  She presents for yearly evaluation.   Past Medical History:  Diagnosis Date   Anemia    takes iron supplement   Anxiety    Asthma    daily and prn inhalers   Bone spur    Left foot   Chest pain    a. 10/2015: NST showing possible mild ischemia but most consistent with breast attenuation. Low-risk study.    Chronic diastolic CHF (congestive heart failure) (HCC)    Common migraine with intractable migraine 07/06/2018   DDD (degenerative disc disease), cervical    Degenerative disc disease    Depression    Diabetes mellitus ([place])    Essential hypertension    Family history of early CAD    Former tobacco use    GERD (gastroesophageal reflux disease)    GERD (gastroesophageal reflux disease)    Headache(784.0)    migraine-like   History of TIA (transient ischemic attack) early 2013   no weakness or deficits   History of UTI    Hyperlipidemia    Insomnia    Migraines    Morbid obesity ([place])    Partial tear of rotator cuff(726.13)    Psoriatic arthritis ([place])    Shoulder impingement 04/2012   left   Sleep apnea    uses CPAP nightly    Past Surgical History:  Procedure Laterality Date   ABDOMINAL HYSTERECTOMY  03/28/2002   complete   ANTERIOR CERVICAL DECOMP/DISCECTOMY FUSION  02/18/2010   C6-7   BREAST BIOPSY Right    DILATION AND CURETTAGE OF UTERUS     2000   FOOT SURGERY Right 2015   KNEE ARTHROPLASTY     ROTATOR CUFF REPAIR Left 2013   TOTAL KNEE ARTHROPLASTY  09/07/2009   left   TOTAL KNEE ARTHROPLASTY  10/13/2008   right   TOTAL SHOULDER ARTHROPLASTY     UMBILICAL HERNIA  REPAIR  03/28/2002   [facility]     WRIST SURGERY  2020    Current Medications: Outpatient Medications Prior to Visit  Medication Sig Dispense Refill   acidophilus (RISAQUAD) CAPS Take 1 capsule by mouth  every morning.      albuterol (VENTOLIN HFA) 108 (90 Base) MCG/ACT inhaler 2 puff q 4-6 hours prn wheezes     ALPRAZolam (XANAX) 0.5 MG tablet Take 0.5 mg by mouth 2 (two) times daily as needed.     amoxicillin (AMOXIL) 500 MG capsule Take 2,000 mg by mouth as needed. Prior to dental work     aspirin 81 MG tablet Take 81 mg by mouth daily.     atorvastatin (LIPITOR) 20 MG tablet Take 20 mg by mouth every evening.      Azelastine HCl 0.15 % SOLN      baclofen (LIORESAL) 10 MG tablet Take 10-20 mg by mouth See admin instructions. Weekly as needed     calcium carbonate (TUMS E-X 750) 750 MG chewable tablet      chlorproMAZINE (THORAZINE) 25 MG tablet Take 25-50 mg by mouth See admin instructions. every 3 hours as needed max twice month  1   dapagliflozin propanediol (FARXIGA) 10 MG TABS tablet Take 1 tablet (10 mg total) by mouth daily before breakfast. 30 tablet 4   diclofenac (CATAFLAM) 50 MG tablet Take 50 mg by mouth 2 (two) times daily.     diclofenac (VOLTAREN) 50 MG EC tablet Take 50 mg by mouth daily in the afternoon.     diclofenac sodium (VOLTAREN) 1 % GEL Apply three grams to three large joints up to three times daily 3 Tube 3   DULoxetine (CYMBALTA) 60 MG capsule Take 60 mg by mouth daily.   3   ferrous sulfate 325 (65 FE) MG tablet Take 325 mg by mouth 2 (two) times a week.     furosemide (LASIX) 40 MG tablet TAKE 1 TABLET BY MOUTH DAILY 90 tablet 3   HYDROcodone-acetaminophen (NORCO/VICODIN) 5-325 MG per tablet Take 1 tablet by mouth as needed for moderate pain.      hydrOXYzine (ATARAX/VISTARIL) 25 MG tablet Take 25 mg by mouth as directed.     irbesartan (AVAPRO) 75 MG tablet Take 37.5 mg by mouth. Take only if blood pressure above 130     loratadine (CLARITIN) 10 MG tablet Take  10 mg by mouth daily.     metFORMIN (GLUCOPHAGE) 500 MG tablet Take 500 mg by mouth daily with breakfast.     methocarbamol (ROBAXIN) 750 MG tablet 2 tabs up to 3xd as needed for muscle spasm     Multiple Vitamin (MULITIVITAMIN WITH MINERALS) TABS Take 1 tablet by mouth daily.     mupirocin nasal ointment (BACTROBAN) 2 % Place 1 application  into the nose 2 (two) times daily. Use one-half of tube in each . After application, press sides of nose together and gently massage. As Needed     NON FORMULARY at bedtime. CPAP     Olopatadine HCl (PATADAY OP) Place 1 drop into both eyes as needed.     ONE TOUCH ULTRA TEST test strip      ONETOUCH DELICA LANCETS 85Y MISC      pantoprazole (PROTONIX) 40 MG tablet Take 40 mg by mouth daily.     TALTZ 80 MG/ML SOAJ INJECT '80MG'$  (1 PEN) UNDER THE SKIN EVERY 4 WEEKS 3 mL 0   topiramate (TOPAMAX) 200 MG tablet Take 200 mg by mouth daily. Take 2 tablets daily at bedtime     vitamin B-12 (CYANOCOBALAMIN) 500 MCG tablet Take 500 mcg by mouth daily.     spironolactone (ALDACTONE) 25 MG tablet Take 0.5 tablets (12.5 mg total) by mouth daily. [place]  tablet 3   dapagliflozin propanediol (FARXIGA) 10 MG TABS tablet Take 1 tablet (10 mg total) by mouth daily before breakfast. (Patient not taking: Reported on 03/29/2022) 14 tablet 0   No facility-administered medications prior to visit.     Allergies:   Patient has no known allergies.   Social History   Socioeconomic History   Marital status: Married    Spouse name: John   Number of children: 5   Years of education: some college   Highest education level: Not on file  Occupational History   Occupation: Disabled  Tobacco Use   Smoking status: Former    Packs/day: 0.50    Years: 20.00    Total pack years: 10.00    Types: Cigarettes    Quit date: 10/02/1999    Years since quitting: 22.5    Passive exposure: Past   Smokeless tobacco: Never  Vaping Use   Vaping Use: Never used  Substance and Sexual Activity    Alcohol use: No   Drug use: No   Sexual activity: Yes    Birth control/protection: None  Other Topics Concern   Not on file  Social History Narrative   Lives with husband, John   Caffeine use: 1 cup coffee every morning   Right handed    Social Determinants of Health   Financial Resource Strain: Not on file  Food Insecurity: Not on file  Transportation Needs: Not on file  Physical Activity: Not on file  Stress: Not on file  Social Connections: Not on file     Family History:  The patient's family history includes Breast cancer in her sister and sister; Cancer in her maternal grandmother and paternal grandfather; Coronary artery disease in her sister; Crohn's disease in her cousin, cousin, maternal aunt, and paternal aunt; Diabetes in her father, maternal grandmother, mother, paternal grandfather, sister, and sister; Hypertension in her father, maternal grandmother, and mother; Kidney disease in her father.   ROS General: Negative; No fevers, chills, or night sweats; positive for morbid obesity HEENT: Negative; No changes in vision or hearing, sinus congestion, difficulty swallowing Pulmonary: Negative; No cough, wheezing, shortness of breath, hemoptysis Cardiovascular: History of hypertension, hyperlipidemia GI: Positive for GERD GU: Negative; No dysuria, hematuria, or difficulty voiding Musculoskeletal: Negative; no myalgias, joint pain, or weakness Hematologic/Oncology: Negative; no easy bruising, bleeding Endocrine: Positive for diabetes mellitus Neuro: Remote TIA Skin: Negative; No rashes or skin lesions Psychiat  Physical Exam BP 138/68   Pulse 86   Ht '5\' 2"'$  (1.575 m)   Wt 265 lb 12.8 oz (120.6 kg)   SpO2 97%   BMI 48.62 kg/m   Repeat blood pressure by me was 126/68  General: Alert, oriented, no distress.  Skin: normal turgor, no rashes, warm and dry HEENT: Normocephalic, atraumatic. Pupils equal round and reactive to light; sclera anicteric; extraocular muscles  intact;  Nose without nasal septal hypertrophy Mouth/Parynx benign; Mallinpatti scale 4 Neck: Thick neck; no JVD, no carotid bruits; normal carotid upstroke Lungs: clear to ausculatation and percussion; no wheezing or rales Chest wall: without tenderness to palpitation Heart: PMI not displaced, RRR, s1 s2 normal, 1/6 systolic murmur, no diastolic murmur, no rubs, gallops, thrills, or heaves Abdomen: soft, nontender; no hepatosplenomehaly, BS+; abdominal aorta nontender and not dilated by palpation. Back: no CVA tenderness Pulses 2+ Musculoskeletal: full range of motion, normal strength, no joint deformities Extremities: no clubbing cyanosis or edema, Homan's sign negative  Neurologic: grossly nonfocal; Cranial nerves grossly wnl Psychologic: Normal mood and affect  PHYSICAL EXAM:   VS:  BP 138/68   Pulse 86   Ht '5\' 2"'$  (1.575 m)   Wt 265 lb 12.8 oz (120.6 kg)   SpO2 97%   BMI 48.62 kg/m    Repeat blood pressure by me was 126/68.   Wt Readings from Last 3 Encounters:  03/29/22 265 lb 12.8 oz (120.6 kg)  03/24/22 265 lb (120.2 kg)  12/01/21 268 lb 12.8 oz (121.9 kg)    General: Alert, oriented, no distress.  Morbid obesity.  Since her last evaluation with me weight has decreased from 273 down to 265 today. Skin: normal turgor, no rashes, warm and dry HEENT: Normocephalic, atraumatic. Pupils equal round and reactive to light; sclera anicteric; extraocular muscles intact; Nose without nasal septal hypertrophy Mouth/Parynx benign; Mallinpatti scale 4 Neck: Thick neck; no JVD, no carotid bruits; normal carotid upstroke Lungs: clear to ausculatation and percussion; no wheezing or rales Chest wall: without tenderness to palpitation Heart: PMI not displaced, RRR, s1 s2 normal, 1/6 systolic murmur, no diastolic murmur, no rubs, gallops, thrills, or heaves Abdomen: soft, nontender; no hepatosplenomehaly, BS+; abdominal aorta nontender and not dilated by palpation. Back: no CVA  tenderness Pulses 2+ Musculoskeletal: full range of motion, normal strength, no joint deformities Extremities: no clubbing cyanosis or edema, Homan's sign negative  Neurologic: grossly nonfocal; Cranial nerves grossly wnl Psychologic: Normal mood and affect    Studies/Labs Reviewed:   March 29, 2022 ECG (independently read by me):  NSR at 92; no ectopy  March 11/2020 ECG (independently read by me): Normal sinus rhythm at 89, no ectopy, normal intervals  Recent Labs:    Latest Ref Rng & Units 01/10/2022   11:14 AM 12/29/2021   10:45 AM 11/03/2021   10:19 AM  BMP  Glucose 65 - 99 mg/dL 100  81  106   BUN 7 - 25 mg/dL '12  17  14   '$ Creatinine 0.50 - 1.05 mg/dL 0.93  1.02  0.82   BUN/Creat Ratio 6 - 22 (calc) NOT APPLICABLE  17  NOT APPLICABLE   Sodium 793 - 146 mmol/L 142  139  139   Potassium 3.5 - 5.3 mmol/L 4.8  4.2  4.7   Chloride 98 - 110 mmol/L 112  105  111   CO2 20 - 32 mmol/L '24  22  26   '$ Calcium 8.6 - 10.4 mg/dL 9.2  10.2  9.4         Latest Ref Rng & Units 01/10/2022   11:14 AM 11/03/2021   10:19 AM 07/28/2021   11:03 AM  Hepatic Function  Total Protein 6.1 - 8.1 g/dL 6.9  6.7  6.8   AST 10 - 35 U/L '14  24  12   '$ ALT 6 - 29 U/L '16  27  13   '$ Total Bilirubin 0.2 - 1.2 mg/dL 0.3  0.3  0.3        Latest Ref Rng & Units 01/10/2022   11:14 AM 11/03/2021   10:19 AM 07/28/2021   11:03 AM  CBC  WBC 3.8 - 10.8 Thousand/uL 7.2  8.0  10.1   Hemoglobin 11.7 - 15.5 g/dL 12.1  12.1  12.2   Hematocrit 35.0 - 45.0 % 38.3  38.4  40.1   Platelets 140 - 400 Thousand/uL 240  223  254    Lab Results  Component Value Date   MCV 85.1 01/10/2022   MCV 86.1 11/03/2021   MCV 85.0 07/28/2021   Lab Results  Component Value  Date   TSH 0.95 10/22/2015   No results found for: "HGBA1C"   BNP    Component Value Date/Time   BNP 102.6 (H) 06/24/2015 0902    ProBNP No results found for: "PROBNP"   Lipid Panel     Component Value Date/Time   CHOL 143 03/12/2013 0200   TRIG 128  03/12/2013 0200   HDL 42 03/12/2013 0200   CHOLHDL 3.4 03/12/2013 0200   VLDL 26 03/12/2013 0200   LDLCALC 75 03/12/2013 0200     RADIOLOGY: No results found.   Additional studies/ records that were reviewed today include:  I reviewed the patient's original sleep study which was done at the [facility] in April 2012 which showed an AHI of 8.9/h.  Events were worse with supine sleep.  Oxygen nadir was 81% with non-REM sleep and 80% with REM sleep.  I obtained a new download today with her new CPAP air sense 11 AutoSet machine from February 26, 2022 through March 27, 2022.   ASSESSMENT:    1. OSA (obstructive sleep apnea)   2. Essential hypertension   3. Chronic diastolic heart failure ([place])   4. Morbid obesity ([place])   5. Hypercholesterolemia   6. Diabetes mellitus type 2 in obese [facility])      PLAN:  [name] is a 61 year old female who has a history of morbid obesity, chronic diastolic heart failure, hypertension, hyperlipidemia, diabetes mellitus, remote TIA, and obstructive sleep apnea diagnosed in December 12, 2010.  She was started on CPAP therapy in 12/12/2010 and received a new machine in 12-11-2013 after her first machine malfunction.  She had a ResMed air sense 10 CPAP auto machine 6-1/2 years with excellent compliance and cannot sleep without it. A download from October 13, 2020 through November 11, 2020 confirmed excellent compliance with average use at 10 hours and 4 minutes per night.  At 13 cm set pressure, AHI 0.9 cm of water.  There are some nights with occasional mask leak.  She received a message on her machine stating that motor failure is imminent.  [employer] is her DME company.  She continues to have issues with insomnia since her father's death in 2005/12/11.  With her excellent compliance, she qualified to receive a new machine but ultimately she was able to obtain on Feb 09, 2021.  She now has the new ResMed air sense 11 AutoSet unit.  She has climate line air 11  tubing.  She is currently set at a minimum pressure of 12 and maximum of 18.  She has been using a full facemask.  When I saw her in July 2022 following obtaining her replace machine, she had excellent compliance with AHI 1.0, 95th percentile pressure 15.9 with maximum average pressure 16.9.  She was using treatment for 8 hours and 44 minutes per night.  Over the past year compliance has continued to be excellent.  I obtained a download today from June 17 through March 27, 2022 which confirms 100% use with average usage at 9 hours and 48 minutes.  AHI 0.7 and her 95th percentile pressure is 15.2 with maximum average pressure 16.6.  She has had some difficulties this year with her oldest son dying at age 45 after he developed lung cancer with subsequent pneumonia and sepsis.  In addition her husband had a significant fall in mid June.  BMI today is 48.62 consistent with morbid obesity.  However she has lost approximately 10 pounds since her last visit with  me 1 year ago.  Repeat blood pressure by me was improved at 126/68 and she continues to be on irbesartan 75 mg, spironolactone 25 mg daily for blood pressure control.  She is followed by Dr. Sallyanne Kuster for cardiology care.  She is on atorvastatin for hyperlipidemia.  She is on Cymbalta for anxiety.  She is on [place] and metformin for diabetes mellitus.  Her Epworth Sleepiness Scale score endorsed at 9 today argue against significant residual daytime sleepiness however she admits to taking naps 2 times per week.  I would not make any changes to her CPAP unit today.  I will see her in 1 year for reevaluation or sooner as needed.   Medication Adjustments/Labs and Tests Ordered: Current medicines are reviewed at length with the patient today.  Concerns regarding medicines are outlined above.  Medication changes, Labs and Tests ordered today are listed in the Patient Instructions below. Patient Instructions  Medication Instructions:  Your physician recommends that  you continue on your current medications as directed. Please refer to the Current Medication list given to you today.  *If you need a refill on your cardiac medications before your next appointment, please call your pharmacy*   Follow-Up: At [facility], you and your health needs are our priority.  As part of our continuing mission to provide you with exceptional heart care, we have created designated Provider Care Teams.  These Care Teams include your primary Cardiologist (physician) and Advanced Practice Providers (APPs -  Physician Assistants and Nurse Practitioners) who all work together to provide you with the care you need, when you need it.  We recommend signing up for the patient portal called "MyChart".  Sign up information is provided on this After Visit Summary.  MyChart is used to connect with patients for Virtual Visits (Telemedicine).  Patients are able to view lab/test results, encounter notes, upcoming appointments, etc.  Non-urgent messages can be sent to your provider as well.   To learn more about what you can do with MyChart, go to [email or web address].    Your next appointment:   12 month(s)  The format for your next appointment:   In Person  Provider:   Shelva Majestic, MD  Other Instructions Sleep Clinic   Signed, Shelva Majestic, MD, [facility], [place], American Board of Sleep Medicine  04/04/2022 [place] [address] [place], [place], [place]  [address] Phone: [phone]

## 2022-03-29 NOTE — Patient Instructions (Signed)

## 2022-04-04 ENCOUNTER — Encounter: Payer: Self-pay | Admitting: Cardiovascular Disease

## 2022-04-06 ENCOUNTER — Ambulatory Visit: Payer: Medicare HMO | Admitting: Physician Assistant

## 2022-04-07 ENCOUNTER — Ambulatory Visit: Payer: Medicare HMO | Admitting: Physician Assistant

## 2022-04-07 ENCOUNTER — Encounter: Payer: Self-pay | Admitting: Physician Assistant

## 2022-04-07 VITALS — BP 120/78 | HR 82 | Ht 62.0 in | Wt 264.0 lb

## 2022-04-07 DIAGNOSIS — M7062 Trochanteric bursitis, left hip: Secondary | ICD-10-CM

## 2022-04-07 DIAGNOSIS — G8929 Other chronic pain: Secondary | ICD-10-CM

## 2022-04-07 DIAGNOSIS — Z8673 Personal history of transient ischemic attack (TIA), and cerebral infarction without residual deficits: Secondary | ICD-10-CM | POA: Diagnosis not present

## 2022-04-07 DIAGNOSIS — M47819 Spondylosis without myelopathy or radiculopathy, site unspecified: Secondary | ICD-10-CM

## 2022-04-07 DIAGNOSIS — M25512 Pain in left shoulder: Secondary | ICD-10-CM | POA: Diagnosis not present

## 2022-04-07 DIAGNOSIS — R911 Solitary pulmonary nodule: Secondary | ICD-10-CM

## 2022-04-07 DIAGNOSIS — Z79899 Other long term (current) drug therapy: Secondary | ICD-10-CM | POA: Diagnosis not present

## 2022-04-07 DIAGNOSIS — M461 Sacroiliitis, not elsewhere classified: Secondary | ICD-10-CM

## 2022-04-07 DIAGNOSIS — Z8679 Personal history of other diseases of the circulatory system: Secondary | ICD-10-CM

## 2022-04-07 DIAGNOSIS — Z8639 Personal history of other endocrine, nutritional and metabolic disease: Secondary | ICD-10-CM | POA: Diagnosis not present

## 2022-04-07 DIAGNOSIS — Z96653 Presence of artificial knee joint, bilateral: Secondary | ICD-10-CM

## 2022-04-07 DIAGNOSIS — I5032 Chronic diastolic (congestive) heart failure: Secondary | ICD-10-CM | POA: Diagnosis not present

## 2022-04-07 DIAGNOSIS — M5030 Other cervical disc degeneration, unspecified cervical region: Secondary | ICD-10-CM

## 2022-04-07 DIAGNOSIS — Z8669 Personal history of other diseases of the nervous system and sense organs: Secondary | ICD-10-CM

## 2022-04-07 NOTE — Patient Instructions (Signed)
Standing Labs We placed an order today for your standing lab work.   Please have your standing labs drawn in October and every 3 months   If possible, please have your labs drawn 2 weeks prior to your appointment so that the provider can discuss your results at your appointment.  Please note that you may see your imaging and lab results in MyChart before we have reviewed them. We may be awaiting multiple results to interpret others before contacting you. Please allow our office up to 72 hours to thoroughly review all of the results before contacting the office for clarification of your results.  We have open lab daily: Monday through Thursday from 1:30-4:30 PM and Friday from 1:30-4:00 PM at the office of Dr. Shaili Deveshwar, [facility].   Please be advised, all patients with office appointments requiring lab work will take precedent over walk-in lab work.  If possible, please come for your lab work on Monday and Friday afternoons, as you may experience shorter wait times. The office is located at [address], [address], [place], [place] [address] No appointment is necessary.   Labs are drawn by [facility]. Please bring your co-pay at the time of your lab draw.  You may receive a bill from [facility] for your lab work.  Please note if you are on Hydroxychloroquine and and an order has been placed for a Hydroxychloroquine level, you will need to have it drawn 4 hours or more after your last dose.  If you wish to have your labs drawn at another location, please call the office 24 hours in advance to send orders.  If you have any questions regarding directions or hours of operation,  please call [phone].   As a reminder, please drink plenty of water prior to coming for your lab work. Thanks!  If you have signs or symptoms of an infection or start antibiotics: First, call your PCP for workup of your infection. Hold your medication through the infection, until you complete your  antibiotics, and until symptoms resolve if you take the following: Injectable medication (Actemra, Benlysta, Cimzia, Cosentyx, Enbrel, Humira, Kevzara, Orencia, Remicade, Simponi, Stelara, Taltz, Tremfya) Methotrexate Leflunomide (Arava) Mycophenolate (Cellcept) Xeljanz, Olumiant, or Rinvoq   

## 2022-04-08 DIAGNOSIS — E119 Type 2 diabetes mellitus without complications: Secondary | ICD-10-CM | POA: Diagnosis not present

## 2022-04-08 DIAGNOSIS — I5189 Other ill-defined heart diseases: Secondary | ICD-10-CM | POA: Diagnosis not present

## 2022-04-08 DIAGNOSIS — I129 Hypertensive chronic kidney disease with stage 1 through stage 4 chronic kidney disease, or unspecified chronic kidney disease: Secondary | ICD-10-CM | POA: Diagnosis not present

## 2022-04-08 DIAGNOSIS — E785 Hyperlipidemia, unspecified: Secondary | ICD-10-CM | POA: Diagnosis not present

## 2022-04-08 LAB — COMPLETE METABOLIC PANEL WITHOUT GFR
AG Ratio: 1.5 (calc) (ref 1.0–2.5)
ALT: 13 U/L (ref 6–29)
AST: 11 U/L (ref 10–35)
Albumin: 4.3 g/dL (ref 3.6–5.1)
Alkaline phosphatase (APISO): 94 U/L (ref 37–153)
BUN: 20 mg/dL (ref 7–25)
CO2: 25 mmol/L (ref 20–32)
Calcium: 9.9 mg/dL (ref 8.6–10.4)
Chloride: 107 mmol/L (ref 98–110)
Creat: 0.95 mg/dL (ref 0.50–1.05)
Globulin: 2.9 g/dL (ref 1.9–3.7)
Glucose, Bld: 109 mg/dL — ABNORMAL HIGH (ref 65–99)
Potassium: 4.4 mmol/L (ref 3.5–5.3)
Sodium: 139 mmol/L (ref 135–146)
Total Bilirubin: 0.3 mg/dL (ref 0.2–1.2)
Total Protein: 7.2 g/dL (ref 6.1–8.1)
eGFR: 69 mL/min/1.73m2

## 2022-04-08 LAB — CBC WITH DIFFERENTIAL/PLATELET
Absolute Monocytes: 593 cells/uL (ref 200–950)
Basophils Absolute: 32 cells/uL (ref 0–200)
Basophils Relative: 0.4 %
Eosinophils Absolute: 213 cells/uL (ref 15–500)
Eosinophils Relative: 2.7 %
HCT: 41 % (ref 35.0–45.0)
Hemoglobin: 12.7 g/dL (ref 11.7–15.5)
Lymphs Abs: 2015 cells/uL (ref 850–3900)
MCH: 26.3 pg — ABNORMAL LOW (ref 27.0–33.0)
MCHC: 31 g/dL — ABNORMAL LOW (ref 32.0–36.0)
MCV: 84.9 fL (ref 80.0–100.0)
MPV: 9 fL (ref 7.5–12.5)
Monocytes Relative: 7.5 %
Neutro Abs: 5048 cells/uL (ref 1500–7800)
Neutrophils Relative %: 63.9 %
Platelets: 227 10*3/uL (ref 140–400)
RBC: 4.83 10*6/uL (ref 3.80–5.10)
RDW: 14.1 % (ref 11.0–15.0)
Total Lymphocyte: 25.5 %
WBC: 7.9 10*3/uL (ref 3.8–10.8)

## 2022-04-08 NOTE — Progress Notes (Signed)
Glucose is 109. Rest of CMP WNL.  CBC stable.  No medication changes recommended at this time.

## 2022-04-11 DIAGNOSIS — I5189 Other ill-defined heart diseases: Secondary | ICD-10-CM | POA: Diagnosis not present

## 2022-04-11 DIAGNOSIS — J45909 Unspecified asthma, uncomplicated: Secondary | ICD-10-CM | POA: Diagnosis not present

## 2022-04-11 DIAGNOSIS — J069 Acute upper respiratory infection, unspecified: Secondary | ICD-10-CM | POA: Diagnosis not present

## 2022-04-11 DIAGNOSIS — R051 Acute cough: Secondary | ICD-10-CM | POA: Diagnosis not present

## 2022-04-11 DIAGNOSIS — I129 Hypertensive chronic kidney disease with stage 1 through stage 4 chronic kidney disease, or unspecified chronic kidney disease: Secondary | ICD-10-CM | POA: Diagnosis not present

## 2022-04-11 DIAGNOSIS — R5383 Other fatigue: Secondary | ICD-10-CM | POA: Diagnosis not present

## 2022-04-11 DIAGNOSIS — N182 Chronic kidney disease, stage 2 (mild): Secondary | ICD-10-CM | POA: Diagnosis not present

## 2022-04-11 DIAGNOSIS — Z1152 Encounter for screening for COVID-19: Secondary | ICD-10-CM | POA: Diagnosis not present

## 2022-04-11 DIAGNOSIS — R0981 Nasal congestion: Secondary | ICD-10-CM | POA: Diagnosis not present

## 2022-04-12 ENCOUNTER — Other Ambulatory Visit: Payer: Self-pay | Admitting: Internal Medicine

## 2022-04-12 DIAGNOSIS — Z1231 Encounter for screening mammogram for malignant neoplasm of breast: Secondary | ICD-10-CM

## 2022-04-29 DIAGNOSIS — D8989 Other specified disorders involving the immune mechanism, not elsewhere classified: Secondary | ICD-10-CM | POA: Diagnosis not present

## 2022-04-29 DIAGNOSIS — M461 Sacroiliitis, not elsewhere classified: Secondary | ICD-10-CM | POA: Diagnosis not present

## 2022-04-29 DIAGNOSIS — F3341 Major depressive disorder, recurrent, in partial remission: Secondary | ICD-10-CM | POA: Diagnosis not present

## 2022-04-29 DIAGNOSIS — J45909 Unspecified asthma, uncomplicated: Secondary | ICD-10-CM | POA: Diagnosis not present

## 2022-04-29 DIAGNOSIS — G894 Chronic pain syndrome: Secondary | ICD-10-CM | POA: Diagnosis not present

## 2022-04-29 DIAGNOSIS — E785 Hyperlipidemia, unspecified: Secondary | ICD-10-CM | POA: Diagnosis not present

## 2022-04-29 DIAGNOSIS — I129 Hypertensive chronic kidney disease with stage 1 through stage 4 chronic kidney disease, or unspecified chronic kidney disease: Secondary | ICD-10-CM | POA: Diagnosis not present

## 2022-04-29 DIAGNOSIS — N182 Chronic kidney disease, stage 2 (mild): Secondary | ICD-10-CM | POA: Diagnosis not present

## 2022-04-29 DIAGNOSIS — I5189 Other ill-defined heart diseases: Secondary | ICD-10-CM | POA: Diagnosis not present

## 2022-04-29 DIAGNOSIS — R69 Illness, unspecified: Secondary | ICD-10-CM | POA: Diagnosis not present

## 2022-04-29 DIAGNOSIS — E119 Type 2 diabetes mellitus without complications: Secondary | ICD-10-CM | POA: Diagnosis not present

## 2022-05-05 DIAGNOSIS — K219 Gastro-esophageal reflux disease without esophagitis: Secondary | ICD-10-CM | POA: Diagnosis not present

## 2022-05-05 DIAGNOSIS — R490 Dysphonia: Secondary | ICD-10-CM | POA: Diagnosis not present

## 2022-05-11 DIAGNOSIS — G43709 Chronic migraine without aura, not intractable, without status migrainosus: Secondary | ICD-10-CM | POA: Diagnosis not present

## 2022-05-11 DIAGNOSIS — M542 Cervicalgia: Secondary | ICD-10-CM | POA: Diagnosis not present

## 2022-05-11 DIAGNOSIS — M791 Myalgia, unspecified site: Secondary | ICD-10-CM | POA: Diagnosis not present

## 2022-05-11 DIAGNOSIS — G518 Other disorders of facial nerve: Secondary | ICD-10-CM | POA: Diagnosis not present

## 2022-05-27 ENCOUNTER — Ambulatory Visit
Admission: RE | Admit: 2022-05-27 | Discharge: 2022-05-27 | Disposition: A | Discharge status: Home / Self Care | Payer: Medicare HMO | Source: Ambulatory Visit | Attending: Internal Medicine | Admitting: Internal Medicine

## 2022-05-27 DIAGNOSIS — Z1231 Encounter for screening mammogram for malignant neoplasm of breast: Secondary | ICD-10-CM | POA: Diagnosis not present

## 2022-05-29 ENCOUNTER — Other Ambulatory Visit: Payer: Self-pay | Admitting: Physician Assistant

## 2022-05-29 DIAGNOSIS — M47819 Spondylosis without myelopathy or radiculopathy, site unspecified: Secondary | ICD-10-CM

## 2022-05-30 NOTE — Telephone Encounter (Signed)
Next Visit: 09/07/2022  Last Visit: 04/07/2022  Last Fill: 03/14/2022  DX: Spondyloarthropathy  Current Dose per office note 04/07/2022: [name] 80 mg subcutaneous injections every 28 days  Labs: 04/07/2022 Glucose is 109. Rest of CMP WNL.  CBC stable.  TB Gold: 01/10/2022 Neg    Okay to refill Taltz?

## 2022-06-06 ENCOUNTER — Telehealth: Payer: Self-pay | Admitting: Rheumatology

## 2022-06-06 NOTE — Telephone Encounter (Signed)
Patient called the office stating she took her Taltz on 05/24/22 and still has bruising. Patient states it only happens when she injects on the left side. Patient would like a call back stating she is afraid its a blood clot.

## 2022-06-07 NOTE — Telephone Encounter (Signed)
Patient states she did her Taltz on 05/24/2022. Patient states she had some bruising. Patient states she states she still has some bruising that has only improved slightly. Patient has access to her my chart and will send picture. Patient states that the bruising only happens when she injects on the left side. Patient advised that since her injection is only once a month she does not have to alternate sides unless she really wants to. Patient expressed understanding.

## 2022-06-13 NOTE — Progress Notes (Unsigned)
Office Visit Note  Patient: [name]             Date of Birth: [birth year]           MRN: [id number]             PCP: Shon Baton, MD Referring: Shon Baton, MD Visit Date: 06/15/2022 Occupation: '@GUAROCC' @  Subjective:  Low back pain   History of Present Illness: [name] is a 61 y.o. female with history of spondyloarthropathy. She is currently on taltz 80 mg sq injections every 4 weeks.  She has not missed any doses of Taltz recently.  She continues to tolerate Taltz without any side effects or injection site reactions.  Her last dose of Taltz was administered on 05/24/2022.  Patient presents today with increased discomfort in her lower back which started 2 weeks ago.  Most of her discomfort has been midline but she is also having discomfort in her right SI joint.  She denies any injury or fall.  She has not had any change in activity prior to the onset of symptoms.  She states that she woke up with the discomfort and it has not been improving over the past 2 weeks.  She denies any lower extremity muscle weakness.  She denies any numbness or tingling down her legs.  She has not had any fevers or bowel or bladder incontinence.  She has been taking hydrocodone for pain relief and methocarbamol for muscle spasms.  She has also tried pain patches which provide some relief.  She has been experiencing some increased pain in her right knee replacement recently.  She denies any other joint pain or joint swelling at this time.  She denies any Achilles tendinitis or plantar fasciitis.  She denies any eye pain, photophobia, or eye redness.  She has not had any recent rashes.  She denies any recent or recurrent infections.  She denies any new medical conditions.    Activities of Daily Living:  Patient reports morning stiffness for all day. Patient Denies nocturnal pain.  Difficulty dressing/grooming: Denies Difficulty climbing stairs: Reports Difficulty getting out of chair:  Reports Difficulty using hands for taps, buttons, cutlery, and/or writing: Denies  Review of Systems  Constitutional:  Positive for fatigue.  HENT:  Positive for mouth dryness. Negative for mouth sores.   Eyes:  Negative for dryness.  Respiratory:  Negative for shortness of breath.   Cardiovascular:  Negative for chest pain and palpitations.  Gastrointestinal:  Negative for blood in stool, constipation and diarrhea.  Endocrine: Positive for increased urination.  Genitourinary:  Negative for involuntary urination.  Musculoskeletal:  Positive for joint pain, joint pain, myalgias, morning stiffness, muscle tenderness and myalgias. Negative for gait problem, joint swelling and muscle weakness.  Skin:  Negative for color change, rash, hair loss and sensitivity to sunlight.  Allergic/Immunologic: Negative for susceptible to infections.  Neurological:  Negative for dizziness and headaches.  Hematological:  Negative for swollen glands.  Psychiatric/Behavioral:  Negative for depressed mood and sleep disturbance. The patient is not nervous/anxious.     PMFS History:  Patient Active Problem List   Diagnosis Date Noted   Tennis Must Quervain's disease (radial styloid tenosynovitis) 11/27/2018   Vertigo 10/18/2018   Common migraine with intractable migraine 07/06/2018   Diabetes mellitus type 2 in obese ([place]) 06/29/2018   Spondyloarthropathy 07/26/2016   High risk medication use 07/26/2016   Chronic low back pain 07/26/2016   H/O total knee replacement, bilateral 07/26/2016   Chronic  diastolic heart failure ([place]) 11/29/2015   Dyspnea 06/03/2015   Lung nodule seen on imaging study, CTA of chest, 6 mm nodule, followed by Dr. Virgina Jock 03/25/2013   Family history of coronary artery disease 03/12/2013   Super obese 03/12/2013   Chest pain with low risk of acute coronary syndrome - most likely musculoskeletal, negative nuc study 03/11/2013   Full thickness rotator cuff tear 05/15/2012   Hypercholesterolemia     DJD (degenerative joint disease), cervical    OSA (obstructive sleep apnea)    Essential hypertension    History of TIA (transient ischemic attack) 9/13    Partial tear of rotator cuff(726.13)    Shoulder impingement 04/12/2012    Past Medical History:  Diagnosis Date   Anemia    takes iron supplement   Anxiety    Asthma    daily and prn inhalers   Bone spur    Left foot   Chest pain    a. 10/2015: NST showing possible mild ischemia but most consistent with breast attenuation. Low-risk study.    Chronic diastolic CHF (congestive heart failure) (HCC)    Common migraine with intractable migraine 07/06/2018   DDD (degenerative disc disease), cervical    Degenerative disc disease    Depression    Diabetes mellitus ([place])    Essential hypertension    Family history of early CAD    Former tobacco use    GERD (gastroesophageal reflux disease)    GERD (gastroesophageal reflux disease)    Headache(784.0)    migraine-like   History of TIA (transient ischemic attack) early 2013   no weakness or deficits   History of UTI    Hyperlipidemia    Insomnia    Migraines    Morbid obesity ([place])    Partial tear of rotator cuff(726.13)    Psoriatic arthritis ([place])    Shoulder impingement 04/2012   left   Sleep apnea    uses CPAP nightly    Family History  Problem Relation Age of Onset   Diabetes Mother    Hypertension Mother    Kidney disease Father    Hypertension Father    Diabetes Father    Breast cancer Sister    Diabetes Sister    Breast cancer Sister    Cancer Maternal Grandmother    Diabetes Maternal Grandmother    Hypertension Maternal Grandmother    Diabetes Paternal Grandfather    Cancer Paternal Grandfather    Coronary artery disease Sister    Diabetes Sister    Crohn's disease Maternal Aunt    Crohn's disease Cousin    Crohn's disease Cousin    Crohn's disease Paternal Aunt    Past Surgical History:  Procedure Laterality Date   ABDOMINAL HYSTERECTOMY   03/28/2002   complete   ANTERIOR CERVICAL DECOMP/DISCECTOMY FUSION  02/18/2010   C6-7   BREAST BIOPSY Right    DILATION AND CURETTAGE OF UTERUS     2000   FOOT SURGERY Right 2015   KNEE ARTHROPLASTY     ROTATOR CUFF REPAIR Left 2013   TOTAL KNEE ARTHROPLASTY  09/07/2009   left   TOTAL KNEE ARTHROPLASTY  10/13/2008   right   TOTAL SHOULDER ARTHROPLASTY     UMBILICAL HERNIA REPAIR  03/28/2002   [facility]     WRIST SURGERY  2020   Social History   Social History Narrative   Lives with husband, John   Caffeine use: 1 cup coffee every morning   Right handed  Immunization History  Administered Date(s) Administered   Influenza-Unspecified 06/05/2018, 06/21/2021   PFIZER([place])SARS-COV-2 Vaccination 11/28/2019, 12/18/2019, 06/02/2020, 02/21/2021     Objective: Vital Signs: BP 130/83 (BP Location: Left Arm, Patient Position: Sitting, Cuff Size: Large)   Pulse 74   Resp 15   Ht '5\' 2"'  (1.575 m)   Wt 267 lb (121.1 kg)   BMI 48.83 kg/m    Physical Exam Vitals and nursing note reviewed.  Constitutional:      Appearance: She is well-developed.  HENT:     Head: Normocephalic and atraumatic.  Eyes:     Conjunctiva/sclera: Conjunctivae normal.  Cardiovascular:     Rate and Rhythm: Normal rate and regular rhythm.     Heart sounds: Normal heart sounds.  Pulmonary:     Effort: Pulmonary effort is normal.     Breath sounds: Normal breath sounds.  Abdominal:     General: Bowel sounds are normal.     Palpations: Abdomen is soft.  Musculoskeletal:     Cervical back: Normal range of motion.  Lymphadenopathy:     Cervical: No cervical adenopathy.  Skin:    General: Skin is warm and dry.     Capillary Refill: Capillary refill takes less than 2 seconds.  Neurological:     Mental Status: She is alert and oriented to person, place, and time.  Psychiatric:        Behavior: Behavior normal.      Musculoskeletal Exam: C-spine has good ROM.  Painful ROM of lumbar spine.  Midline  spinal tenderness in the lumbar region.  Tenderness over both SI joints, right > left.  Shoulder joints, elbow joints, wrist joints, MCPs, PIPs, DIPs have good range of motion with no synovitis.  Complete fist formation bilaterally.  Hip joints have good range of motion with no groin pain.  No tenderness over trochanteric bursa bilaterally.  Knee replacements have good range of motion with no warmth or effusion.  Ankles have good ROM with no tenderness or joint swelling.  No evidence of achilles tendonitis or plantar fasciitis.   CDAI Exam: CDAI Score: -- Patient Global: --; Provider Global: -- Swollen: --; Tender: -- Joint Exam 06/15/2022   No joint exam has been documented for this visit   There is currently no information documented on the homunculus. Go to the Rheumatology activity and complete the homunculus joint exam.  Investigation: No additional findings.  Imaging: MM 3D SCREEN BREAST BILATERAL  Result Date: 05/30/2022 CLINICAL DATA:  Screening. EXAM: DIGITAL SCREENING BILATERAL MAMMOGRAM WITH TOMOSYNTHESIS AND CAD TECHNIQUE: Bilateral screening digital craniocaudal and mediolateral oblique mammograms were obtained. Bilateral screening digital breast tomosynthesis was performed. The images were evaluated with computer-aided detection. COMPARISON:  Previous exam(s). ACR Breast Density Category b: There are scattered areas of fibroglandular density. FINDINGS: There are no findings suspicious for malignancy. IMPRESSION: No mammographic evidence of malignancy. A result letter of this screening mammogram will be mailed directly to the patient. RECOMMENDATION: Screening mammogram in one year. (Code:SM-B-01Y) BI-RADS CATEGORY  1: Negative. Electronically Signed   By: Ammie Ferrier [name]   On: 05/30/2022 13:16    Recent Labs: Lab Results  Component Value Date   WBC 7.9 04/07/2022   HGB 12.7 04/07/2022   PLT 227 04/07/2022   NA 139 04/07/2022   K 4.4 04/07/2022   CL 107 04/07/2022    CO2 25 04/07/2022   GLUCOSE 109 (H) 04/07/2022   BUN 20 04/07/2022   CREATININE 0.95 04/07/2022   BILITOT 0.3 04/07/2022  ALKPHOS 119 06/27/2018   AST 11 04/07/2022   ALT 13 04/07/2022   PROT 7.2 04/07/2022   ALBUMIN 3.6 06/27/2018   CALCIUM 9.9 04/07/2022   GFRAA 79 01/20/2021   QFTBGOLDPLUS NEGATIVE 01/10/2022    Speciality Comments: Patient recieves Cos[employer] through [employer] 04/29/2019  Procedures:  No procedures performed Allergies: Patient has no known allergies.   Assessment / Plan:     Visit Diagnoses: Spondyloarthropathy - MRI positive for sacroiliitis, HLA-B27 negative, elevated ESR: Patient presents today with acute pain in her lower back for the past 2 weeks.  No injury, fall, or change in activity prior to the onset of symptoms.  She has some midline spinal tenderness in the lumbar region as well as over the right SI joint.  X-rays of the lumbar spine and pelvis were obtained today for further evaluation.  ESR and CRP were also checked today.  She remains on Taltz 80 mg sq injections every 4 weeks.  Her last dose of Taltz was administered on 05/24/2022.  She has not had any interruptions in therapy.  She has no synovitis or dactylitis on examination today.  No evidence of Achilles tendinitis or plantar fasciitis.  No signs or symptoms of uveitis. She will remain on Taltz as prescribed.  A prednisone taper starting at 20 mg tapering by 5 mg every 2 days was sent to the pharmacy.  She was advised to notify us if her symptoms persist or worsen.  She will follow-up in the office in 3 months or sooner if needed. - Plan: Sedimentation rate, C-reactive protein  High risk medication use - Taltz 80 mg subcutaneous injections every 28 days.  Last dose: 05/24/22.  - Plan: COMPLETE METABOLIC PANEL WITH GFR, CBC with Differential/Platelet, CBC with Differential/Platelet, COMPLETE METABOLIC PANEL WITH GFR CBC and CMP updated on 04/07/22.  Orders for CBC and CMP  were released today.  Her next lab work will be due in January and every 3 months to monitor for drug toxicity.  Standing orders for CBC and CMP placed today.  TB gold negative on 01/10/22. She has not had any recent or recurrent infections.  Discussed the importance of holding taltz if she develops signs or symptoms of an infection and to resume once the infection has completely cleared.  Chronic midline low back pain without sciatica -She presents today with increased pain and stiffness in her lower back which started 2 weeks ago.  She had no injury, fall, or change in activity prior to the onset of symptoms.  She has not had any gaps while taking Taltz.  On examination she has good range of motion with midline spinal tenderness as well as tenderness over both SI joints, right greater than left.  She is not experiencing any symptoms of radiculopathy or lower extremity muscle weakness.  She has not had any fevers or bowel or bladder incontinence.  She is been taking hydrocodone for pain relief and methocarbamol as needed for muscle spasms.  She has had difficulty resting at night due to trying to get comfortable.  X-rays of the lumbar spine and pelvis were obtained today for further evaluation.  Images were reviewed with the patient today and all questions were addressed.  Given treatment options were discussed.  A short low-dose prednisone taper starting 20 mg tapering by 5 mg every 2 days was sent to the pharmacy today.  Instructions were provided.  She was advised to monitor blood glucose closely while taking prednisone.  She will notify  us if her symptoms persist or worsen.  Plan: XR Lumbar Spine 2-3 Views, XR Pelvis 1-2 Views  Chronic right SI joint pain - She presents today with increased SI joint pain especially on the right side.  X-rays of the pelvis were obtained today for further evaluation.  Discussed proceeding with a right SI joint cortisone injection versus a short course of prednisone.   Prednisone starting at 20 mg tapering by 5 mg every 2 days was sent to the pharmacy.  She was advised to monitor blood glucose closely while taking prednisone.  She should also avoid the use of NSAIDs while taking prednisone.  She will notify us if her symptoms persist or worsen.  Plan: XR Pelvis 1-2 Views  Sacroiliitis ([place]) - She presents today with increased pain in both SI joints, right greater than left.  No recent injury or fall.  No change in activity.  She has been unable to identify a trigger for her symptoms.  She is not experiencing any symptoms of sciatica at this time.  No lower extremity muscle weakness.  X-rays of the pelvis were obtained today for further evaluation.  ESR and CRP will also be checked today.  A prednisone taper starting 20 mg tapering by 5 mg every 2 days was sent to the pharmacy.  She plans on continuing to use hydrocodone and taking methocarbamol as needed for muscle spasms.  She was advised to notify us if her symptoms persist or worsen.  Plan: Sedimentation rate, C-reactive protein  Chronic left shoulder pain: Resolved.  Good range of motion with no discomfort.  DDD (degenerative disc disease), cervical: C-spine has good range of motion with no discomfort at this time.  No symptoms of radiculopathy.  Trochanteric bursitis, left hip: Intermittent discomfort.  Discussed the importance of performing stretching exercises daily.  H/O total knee replacement, bilateral: She has been experiencing some increased discomfort in her right knee replacement.  On examination today she has good range of motion with discomfort and mild warmth but no effusion.  Other medical conditions are listed as follows:   Lung nodule seen on imaging study, CTA of chest, 6 mm nodule, followed by Dr. Virgina Jock  History of hypertension: BP was 130/83 today in the office.   History of TIA (transient ischemic attack)  Chronic diastolic heart failure (HCC)  History of diabetes mellitus  History  of sleep apnea  History of hyperlipidemia    Orders: Orders Placed This Encounter  Procedures   XR Lumbar Spine 2-3 Views   XR Pelvis 1-2 Views   COMPLETE METABOLIC PANEL WITH GFR   CBC with Differential/Platelet   CBC with Differential/Platelet   COMPLETE METABOLIC PANEL WITH GFR   Sedimentation rate   C-reactive protein   Meds ordered this encounter  Medications   predniSONE (DELTASONE) 5 MG tablet    Sig: Take 4 tablets by mouth daily x2 days, 3 tablets daily x2 days, 2 tablets daily x2 days, 1 tablet daily x2 days.    Dispense:  20 tablet    Refill:  0   Follow-Up Instructions: Return in 3 months (on 09/15/2022) for Spondyloarthropathy.   Ofilia Neas, PA-C  Note - This record has been created using Dragon software.  Chart creation errors have been sought, but may not always  have been located. Such creation errors do not reflect on  the standard of medical care.

## 2022-06-15 ENCOUNTER — Ambulatory Visit (INDEPENDENT_AMBULATORY_CARE_PROVIDER_SITE_OTHER): Payer: Medicare HMO

## 2022-06-15 ENCOUNTER — Telehealth: Payer: Self-pay

## 2022-06-15 ENCOUNTER — Other Ambulatory Visit: Payer: Self-pay | Admitting: *Deleted

## 2022-06-15 ENCOUNTER — Encounter: Payer: Self-pay | Admitting: Physician Assistant

## 2022-06-15 ENCOUNTER — Ambulatory Visit: Payer: Medicare HMO | Attending: Physician Assistant | Admitting: Physician Assistant

## 2022-06-15 VITALS — BP 130/83 | HR 74 | Resp 15 | Ht 62.0 in | Wt 267.0 lb

## 2022-06-15 DIAGNOSIS — Z8639 Personal history of other endocrine, nutritional and metabolic disease: Secondary | ICD-10-CM

## 2022-06-15 DIAGNOSIS — R911 Solitary pulmonary nodule: Secondary | ICD-10-CM | POA: Diagnosis not present

## 2022-06-15 DIAGNOSIS — M7062 Trochanteric bursitis, left hip: Secondary | ICD-10-CM

## 2022-06-15 DIAGNOSIS — Z8679 Personal history of other diseases of the circulatory system: Secondary | ICD-10-CM

## 2022-06-15 DIAGNOSIS — G8929 Other chronic pain: Secondary | ICD-10-CM

## 2022-06-15 DIAGNOSIS — Z96653 Presence of artificial knee joint, bilateral: Secondary | ICD-10-CM | POA: Diagnosis not present

## 2022-06-15 DIAGNOSIS — M5030 Other cervical disc degeneration, unspecified cervical region: Secondary | ICD-10-CM | POA: Diagnosis not present

## 2022-06-15 DIAGNOSIS — M533 Sacrococcygeal disorders, not elsewhere classified: Secondary | ICD-10-CM | POA: Diagnosis not present

## 2022-06-15 DIAGNOSIS — Z8673 Personal history of transient ischemic attack (TIA), and cerebral infarction without residual deficits: Secondary | ICD-10-CM

## 2022-06-15 DIAGNOSIS — Z79899 Other long term (current) drug therapy: Secondary | ICD-10-CM

## 2022-06-15 DIAGNOSIS — I5032 Chronic diastolic (congestive) heart failure: Secondary | ICD-10-CM

## 2022-06-15 DIAGNOSIS — M25512 Pain in left shoulder: Secondary | ICD-10-CM

## 2022-06-15 DIAGNOSIS — M461 Sacroiliitis, not elsewhere classified: Secondary | ICD-10-CM | POA: Diagnosis not present

## 2022-06-15 DIAGNOSIS — M545 Low back pain, unspecified: Secondary | ICD-10-CM | POA: Diagnosis not present

## 2022-06-15 DIAGNOSIS — M47819 Spondylosis without myelopathy or radiculopathy, site unspecified: Secondary | ICD-10-CM | POA: Diagnosis not present

## 2022-06-15 DIAGNOSIS — Z8669 Personal history of other diseases of the nervous system and sense organs: Secondary | ICD-10-CM

## 2022-06-15 MED ORDER — PREDNISONE 5 MG PO TABS: ORAL_TABLET | ORAL | 0 refills | Status: DC

## 2022-06-15 NOTE — Progress Notes (Signed)
CBC stable

## 2022-06-15 NOTE — Progress Notes (Signed)
ESR WNL

## 2022-06-15 NOTE — Progress Notes (Signed)
X-rays of the lumbar spine are consistent with mild degenerative disc disease and facet joint arthritis. Results were discussed today in the office.

## 2022-06-15 NOTE — Patient Instructions (Signed)
Standing Labs We placed an order today for your standing lab work.   Please have your standing labs drawn in January and every 3 months   Please have your labs drawn 2 weeks prior to your appointment so that the provider can discuss your lab results at your appointment.  Please note that you may see your imaging and lab results in [facility] before we have reviewed them. We will contact you once all results are reviewed. Please allow our office up to 72 hours to thoroughly review all of the results before contacting the office for clarification of your results.  Lab hours are: Monday through Thursday from 1:30 pm-4:30 pm and Friday from 1:30 pm- 4:00 pm  You may experience shorter wait times on Monday, Thursday or Friday afternoons,.   Effective July 11, 2022, new lab hours will be: Monday through Thursday from 8:00 am -12:30 pm and 1:00 pm-5:00 pm and Friday from 8:00 am-12:00 pm.  Please be advised, all patients with office appointments requiring lab work will take precedent over walk-in lab work.   Labs are drawn by [facility]. Please bring your co-pay at the time of your lab draw.  You may receive a bill from [facility] for your lab work.  Please note if you are on Hydroxychloroquine and and an order has been placed for a Hydroxychloroquine level, you will need to have it drawn 4 hours or more after your last dose.  If you wish to have your labs drawn at another location, please call the office 24 hours in advance so we can fax the orders.  The office is located at [address] [place], [place], [place] [address] No appointment is necessary.    If you have any questions regarding directions or hours of operation,  please call [phone].   As a reminder, please drink plenty of water prior to coming for your lab work. Thanks!

## 2022-06-15 NOTE — Progress Notes (Signed)
X-rays of the pelvis were unremarkable.

## 2022-06-15 NOTE — Telephone Encounter (Signed)
Patient called stating that [facility] did receive her prescription but that it won't arrive until tomorrow afternoon. Patient wants to know if we should switch it to a local pharmacy so that she could pick it up in the morning.    Please advise, thanks!

## 2022-06-15 NOTE — Telephone Encounter (Signed)
I called patient, new RX sent to [facility], [facility] closed, RX at [facility] needs to be cancelled.

## 2022-06-15 NOTE — Telephone Encounter (Signed)
Ok to cancel prednisone taper at [facility] and switch to local pharmacy.

## 2022-06-16 LAB — COMPLETE METABOLIC PANEL WITH GFR
AG Ratio: 1.6 (calc) (ref 1.0–2.5)
ALT: 15 U/L (ref 6–29)
AST: 14 U/L (ref 10–35)
Albumin: 4.2 g/dL (ref 3.6–5.1)
Alkaline phosphatase (APISO): 100 U/L (ref 37–153)
BUN: 12 mg/dL (ref 7–25)
CO2: 25 mmol/L (ref 20–32)
Calcium: 9.9 mg/dL (ref 8.6–10.4)
Chloride: 108 mmol/L (ref 98–110)
Creat: 0.86 mg/dL (ref 0.50–1.05)
Globulin: 2.6 g/dL (calc) (ref 1.9–3.7)
Glucose, Bld: 89 mg/dL (ref 65–99)
Potassium: 4.3 mmol/L (ref 3.5–5.3)
Sodium: 141 mmol/L (ref 135–146)
Total Bilirubin: 0.3 mg/dL (ref 0.2–1.2)
Total Protein: 6.8 g/dL (ref 6.1–8.1)
eGFR: 77 mL/min/{1.73_m2} (ref >=60)

## 2022-06-16 LAB — CBC WITH DIFFERENTIAL/PLATELET
Absolute Monocytes: 631 cells/uL (ref 200–950)
Basophils Absolute: 46 cells/uL (ref 0–200)
Basophils Relative: 0.6 %
Eosinophils Absolute: 262 cells/uL (ref 15–500)
Eosinophils Relative: 3.4 %
HCT: 38.7 % (ref 35.0–45.0)
Hemoglobin: 12.3 g/dL (ref 11.7–15.5)
Lymphs Abs: 2079 cells/uL (ref 850–3900)
MCH: 26.4 pg — ABNORMAL LOW (ref 27.0–33.0)
MCHC: 31.8 g/dL — ABNORMAL LOW (ref 32.0–36.0)
MCV: 83 fL (ref 80.0–100.0)
MPV: 9.4 fL (ref 7.5–12.5)
Monocytes Relative: 8.2 %
Neutro Abs: 4682 cells/uL (ref 1500–7800)
Neutrophils Relative %: 60.8 %
Platelets: 234 10*3/uL (ref 140–400)
RBC: 4.66 10*6/uL (ref 3.80–5.10)
RDW: 14.6 % (ref 11.0–15.0)
Total Lymphocyte: 27 %
WBC: 7.7 10*3/uL (ref 3.8–10.8)

## 2022-06-16 LAB — C-REACTIVE PROTEIN: CRP: 14.6 mg/L — ABNORMAL HIGH (ref <8.0)

## 2022-06-16 LAB — SEDIMENTATION RATE: Sed Rate: 29 mm/h (ref 0–30)

## 2022-06-16 NOTE — Telephone Encounter (Signed)
Called [facility] and canceled prescription for prednisone.

## 2022-06-16 NOTE — Progress Notes (Signed)
CMP WNL.  CRP is elevated.  Patient was prescribed a prednisone taper yesterday.

## 2022-06-23 DIAGNOSIS — Z23 Encounter for immunization: Secondary | ICD-10-CM | POA: Diagnosis not present

## 2022-06-28 ENCOUNTER — Telehealth: Payer: Self-pay | Admitting: Rheumatology

## 2022-06-28 NOTE — Telephone Encounter (Signed)
Patient left voicemail requesting to speak with [facility] regarding the renewal process for her Taltz medication.

## 2022-06-29 NOTE — Telephone Encounter (Signed)
Returned call to patient. Advised that it is too soon to reapply for patient assistance for [employer] for her Donnetta Hail as she is actively enrolled through 09/11/22. Advised to reach back out if she does not receive renewal information by the end of November 2023. She verbalized understanding. Nothing further needed.  Knox Saliva, PharmD, MPH, BCPS, CPP Clinical Pharmacist (Rheumatology and Pulmonology)

## 2022-07-07 ENCOUNTER — Other Ambulatory Visit: Payer: Self-pay | Admitting: Cardiovascular Disease

## 2022-07-13 ENCOUNTER — Telehealth: Payer: Self-pay | Admitting: Pharmacist

## 2022-07-13 DIAGNOSIS — M47819 Spondylosis without myelopathy or radiculopathy, site unspecified: Secondary | ICD-10-CM

## 2022-07-13 NOTE — Telephone Encounter (Signed)
Received fax from [facility] that they are taking 2024 patient assistance renewal application. Patient receives TALTZ through patient assistance program. Patient states she prefers application to be mailed. She will complete her portion and return to clinic   Patient portion mailed today.   Provider portion placed in Hazel Sams, PA-C's folder to be signed   Submitted a Prior Authorization RENEWAL request to [facility] for TALTZ via CoverMyMeds. PA approval will need to be attached to application when submitted   Key: [id number]  Per automated response, [facility] is not able to process this request through [place], please contact the plan at [phone] or fax in request to [phone].  Called [facility] to initiate Donnetta Hail renewal over the phone. Per rep, 2024 criteria for use have not been uploaded. Additionally they are not able to send 2023 approval letter because 2022 auth was auto-extended. Rep states that if program needs verification of PA approval, they need to call insurance for this.  Case # [id number] Phone: [phone]  Knox Saliva, PharmD, MPH, BCPS, CPP Clinical Pharmacist (Rheumatology and Pulmonology)

## 2022-07-14 NOTE — Telephone Encounter (Signed)
Received notification from [facility] regarding a prior authorization for [place]. Authorization has been APPROVED from 09/12/2022 to 09/12/2023.   Will place with provider portion. Still pending signature from Hazel Sams, PA-C  Knox Saliva, PharmD, MPH, BCPS, CPP Clinical Pharmacist (Rheumatology and Pulmonology)

## 2022-07-14 NOTE — Telephone Encounter (Signed)
Signed provider portion of Taltz PAP renewal application received. Pending patient portion  Knox Saliva, PharmD, MPH, BCPS, CPP Clinical Pharmacist (Rheumatology and Pulmonology)

## 2022-07-14 NOTE — Telephone Encounter (Signed)
Received additional clinical questions for patient's Taltz PA renewal. Completed form and faxed to [place] with clinicals  Phone: [phone] Fax: [phone] Case # [id number]  Knox Saliva, PharmD, MPH, BCPS, CPP Clinical Pharmacist (Rheumatology and Pulmonology)

## 2022-07-25 NOTE — Telephone Encounter (Signed)
LVM with pt to inform about approved PA and necessity for patient assistance. Patient seemed confused as to why she needs to fill out patient assistance information, as she received information that her PA was approved. Called to clarify, no answer, left VM.  [place] [facility] PharmD Candidate [phone]

## 2022-07-28 NOTE — Telephone Encounter (Signed)
Followed up with patient about her question concerning if she even needed to fill out the PAP application since the PA for TALTZ was approved with her insurance. Spoke with patient about the benefit of PAP and how it will take her copay from several hundred dollars to $0. She expressed understanding and said she will be filling out the PAP application and sending it to [place].   [place] [facility] PharmD Candidate [phone]

## 2022-08-08 DIAGNOSIS — G518 Other disorders of facial nerve: Secondary | ICD-10-CM | POA: Diagnosis not present

## 2022-08-08 DIAGNOSIS — G43709 Chronic migraine without aura, not intractable, without status migrainosus: Secondary | ICD-10-CM | POA: Diagnosis not present

## 2022-08-08 DIAGNOSIS — M791 Myalgia, unspecified site: Secondary | ICD-10-CM | POA: Diagnosis not present

## 2022-08-08 DIAGNOSIS — M542 Cervicalgia: Secondary | ICD-10-CM | POA: Diagnosis not present

## 2022-08-08 NOTE — Telephone Encounter (Signed)
Submitted Patient Assistance Application to [facility] for TALTZ along with provider portion, patient portion, PA, insurance card copy, med list/allergies, and income documents. Will update patient when we receive a response.  Fax# [phone] Phone# [phone] Reference# [id number]  [place] [facility] PharmD Candidate [phone]

## 2022-08-09 NOTE — Telephone Encounter (Signed)
Received a fax from  [facility] regarding an approval for [place] patient assistance from 09/12/2022 to 09/12/2023.   Phone number: [phone] Fax number: [phone]  [place] [facility] PharmD Candidate [phone]

## 2022-08-11 MED ORDER — TALTZ 80 MG/ML ~~LOC~~ SOAJ: 80.0000 mg | SUBCUTANEOUS | 0 refills | Status: DC

## 2022-08-11 NOTE — Addendum Note (Signed)
Addended by: Cassandria Anger on: 08/11/2022 08:15 AM   Modules accepted: Orders

## 2022-08-24 NOTE — Progress Notes (Deleted)
Office Visit Note  Patient: [name]             Date of Birth: 1960/10/06           MRN: [id number]             PCP: Shon Baton, MD Referring: Shon Baton, MD Visit Date: 09/07/2022 Occupation: '@GUAROCC'$ @  Subjective:  No chief complaint on file.   History of Present Illness: [name] is a 61 y.o. female ***   Activities of Daily Living:  Patient reports morning stiffness for *** {minute/hour:19697}.   Patient {ACTIONS;DENIES/REPORTS:21021675::"Denies"} nocturnal pain.  Difficulty dressing/grooming: {ACTIONS;DENIES/REPORTS:21021675::"Denies"} Difficulty climbing stairs: {ACTIONS;DENIES/REPORTS:21021675::"Denies"} Difficulty getting out of chair: {ACTIONS;DENIES/REPORTS:21021675::"Denies"} Difficulty using hands for taps, buttons, cutlery, and/or writing: {ACTIONS;DENIES/REPORTS:21021675::"Denies"}  No Rheumatology ROS completed.   PMFS History:  Patient Active Problem List   Diagnosis Date Noted   Tennis Must Quervain's disease (radial styloid tenosynovitis) 11/27/2018   Vertigo 10/18/2018   Common migraine with intractable migraine 07/06/2018   Diabetes mellitus type 2 in obese ([place]) 06/29/2018   Spondyloarthropathy 07/26/2016   High risk medication use 07/26/2016   Chronic low back pain 07/26/2016   H/O total knee replacement, bilateral 07/26/2016   Chronic diastolic heart failure ([place]) 11/29/2015   Dyspnea 06/03/2015   Lung nodule seen on imaging study, CTA of chest, 6 mm nodule, followed by Dr. Virgina Jock 03/25/2013   Family history of coronary artery disease 03/12/2013   Super obese 03/12/2013   Chest pain with low risk of acute coronary syndrome - most likely musculoskeletal, negative nuc study 03/11/2013   Full thickness rotator cuff tear 05/15/2012   Hypercholesterolemia    DJD (degenerative joint disease), cervical    OSA (obstructive sleep apnea)    Essential hypertension    History of TIA (transient ischemic attack) 9/13    Partial tear of  rotator cuff(726.13)    Shoulder impingement 04/12/2012    Past Medical History:  Diagnosis Date   Anemia    takes iron supplement   Anxiety    Asthma    daily and prn inhalers   Bone spur    Left foot   Chest pain    a. 10/2015: NST showing possible mild ischemia but most consistent with breast attenuation. Low-risk study.    Chronic diastolic CHF (congestive heart failure) (HCC)    Common migraine with intractable migraine 07/06/2018   DDD (degenerative disc disease), cervical    Degenerative disc disease    Depression    Diabetes mellitus ([place])    Essential hypertension    Family history of early CAD    Former tobacco use    GERD (gastroesophageal reflux disease)    GERD (gastroesophageal reflux disease)    Headache(784.0)    migraine-like   History of TIA (transient ischemic attack) early 2013   no weakness or deficits   History of UTI    Hyperlipidemia    Insomnia    Migraines    Morbid obesity (HCC)    Partial tear of rotator cuff(726.13)    Psoriatic arthritis (HCC)    Shoulder impingement 04/2012   left   Sleep apnea    uses CPAP nightly    Family History  Problem Relation Age of Onset   Diabetes Mother    Hypertension Mother    Kidney disease Father    Hypertension Father    Diabetes Father    Breast cancer Sister    Diabetes Sister    Breast cancer Sister    Cancer  Maternal Grandmother    Diabetes Maternal Grandmother    Hypertension Maternal Grandmother    Diabetes Paternal Grandfather    Cancer Paternal Grandfather    Coronary artery disease Sister    Diabetes Sister    Crohn's disease Maternal Aunt    Crohn's disease Cousin    Crohn's disease Cousin    Crohn's disease Paternal Aunt    Past Surgical History:  Procedure Laterality Date   ABDOMINAL HYSTERECTOMY  03/28/2002   complete   ANTERIOR CERVICAL DECOMP/DISCECTOMY FUSION  02/18/2010   C6-7   BREAST BIOPSY Right    DILATION AND CURETTAGE OF UTERUS     2000   FOOT SURGERY Right 2015    KNEE ARTHROPLASTY     ROTATOR CUFF REPAIR Left 2013   TOTAL KNEE ARTHROPLASTY  09/07/2009   left   TOTAL KNEE ARTHROPLASTY  10/13/2008   right   TOTAL SHOULDER ARTHROPLASTY     UMBILICAL HERNIA REPAIR  03/28/2002   wisidom     WRIST SURGERY  2020   Social History   Social History Narrative   Lives with husband, [name]   Caffeine use: 1 cup coffee every morning   Right handed    Immunization History  Administered Date(s) Administered   Influenza-Unspecified 06/05/2018, 06/21/2021   PFIZER(Purple Top)SARS-COV-2 Vaccination 11/28/2019, 12/18/2019, 06/02/2020, 02/21/2021     Objective: Vital Signs: There were no vitals taken for this visit.   Physical Exam   Musculoskeletal Exam: ***  CDAI Exam: CDAI Score: -- Patient Global: --; Provider Global: -- Swollen: --; Tender: -- Joint Exam 09/07/2022   No joint exam has been documented for this visit   There is currently no information documented on the homunculus. Go to the Rheumatology activity and complete the homunculus joint exam.  Investigation: No additional findings.  Imaging: No results found.  Recent Labs: Lab Results  Component Value Date   WBC 7.7 06/15/2022   HGB 12.3 06/15/2022   PLT 234 06/15/2022   NA 141 06/15/2022   K 4.3 06/15/2022   CL 108 06/15/2022   CO2 25 06/15/2022   GLUCOSE 89 06/15/2022   BUN 12 06/15/2022   CREATININE 0.86 06/15/2022   BILITOT 0.3 06/15/2022   ALKPHOS 119 06/27/2018   AST 14 06/15/2022   ALT 15 06/15/2022   PROT 6.8 06/15/2022   ALBUMIN 3.6 06/27/2018   CALCIUM 9.9 06/15/2022   GFRAA 79 01/20/2021   QFTBGOLDPLUS NEGATIVE 01/10/2022    Speciality Comments: Patient recieves Cosentyx through [employer] 04/29/2019  Procedures:  No procedures performed Allergies: Patient has no known allergies.   Assessment / Plan:     Visit Diagnoses: No diagnosis found.  Orders: No orders of the defined types were placed in this  encounter.  No orders of the defined types were placed in this encounter.   Face-to-face time spent with patient was *** minutes. Greater than 50% of time was spent in counseling and coordination of care.  Follow-Up Instructions: No follow-ups on file.   Earnestine Mealing, CMA  Note - This record has been created using Editor, commissioning.  Chart creation errors have been sought, but may not always  have been located. Such creation errors do not reflect on  the standard of medical care.

## 2022-09-07 ENCOUNTER — Ambulatory Visit: Payer: Medicare HMO | Admitting: Physician Assistant

## 2022-09-07 DIAGNOSIS — Z8679 Personal history of other diseases of the circulatory system: Secondary | ICD-10-CM

## 2022-09-07 DIAGNOSIS — Z8673 Personal history of transient ischemic attack (TIA), and cerebral infarction without residual deficits: Secondary | ICD-10-CM

## 2022-09-07 DIAGNOSIS — R911 Solitary pulmonary nodule: Secondary | ICD-10-CM

## 2022-09-07 DIAGNOSIS — M461 Sacroiliitis, not elsewhere classified: Secondary | ICD-10-CM

## 2022-09-07 DIAGNOSIS — Z79899 Other long term (current) drug therapy: Secondary | ICD-10-CM

## 2022-09-07 DIAGNOSIS — I5032 Chronic diastolic (congestive) heart failure: Secondary | ICD-10-CM

## 2022-09-07 DIAGNOSIS — G8929 Other chronic pain: Secondary | ICD-10-CM

## 2022-09-07 DIAGNOSIS — Z8639 Personal history of other endocrine, nutritional and metabolic disease: Secondary | ICD-10-CM

## 2022-09-07 DIAGNOSIS — M5030 Other cervical disc degeneration, unspecified cervical region: Secondary | ICD-10-CM

## 2022-09-07 DIAGNOSIS — Z8669 Personal history of other diseases of the nervous system and sense organs: Secondary | ICD-10-CM

## 2022-09-07 DIAGNOSIS — M545 Low back pain, unspecified: Secondary | ICD-10-CM

## 2022-09-07 DIAGNOSIS — M47819 Spondylosis without myelopathy or radiculopathy, site unspecified: Secondary | ICD-10-CM

## 2022-09-07 DIAGNOSIS — Z96653 Presence of artificial knee joint, bilateral: Secondary | ICD-10-CM

## 2022-09-07 DIAGNOSIS — M7062 Trochanteric bursitis, left hip: Secondary | ICD-10-CM

## 2022-09-16 ENCOUNTER — Ambulatory Visit: Payer: Medicare HMO | Admitting: Physician Assistant

## 2022-09-19 NOTE — Progress Notes (Deleted)
Office Visit Note  Patient: [name]             Date of Birth: 12-Apr-1961           MRN: [id number]             PCP: Shon Baton, MD Referring: Shon Baton, MD Visit Date: 10/03/2022 Occupation: @GUAROCC$ @  Subjective:    History of Present Illness: [name] is a 62 y.o. female with history of spondyloarthropathy and DDD. She remains on taltz 80 mg sq injections every 4 weeks.   CBC and CMP updated on 09/29/21.  Her next lab work will be due in April and every 3 months.  TB gold negative on 01/10/22.  Future order for TB gold placed today. Discussed the importance of holding taltz if she develops signs or symptoms of an infection and to resume once the infection has completely cleared.   Activities of Daily Living:  Patient reports morning stiffness for *** {minute/hour:19697}.   Patient {ACTIONS;DENIES/REPORTS:21021675::"Denies"} nocturnal pain.  Difficulty dressing/grooming: {ACTIONS;DENIES/REPORTS:21021675::"Denies"} Difficulty climbing stairs: {ACTIONS;DENIES/REPORTS:21021675::"Denies"} Difficulty getting out of chair: {ACTIONS;DENIES/REPORTS:21021675::"Denies"} Difficulty using hands for taps, buttons, cutlery, and/or writing: {ACTIONS;DENIES/REPORTS:21021675::"Denies"}  No Rheumatology ROS completed.   PMFS History:  Patient Active Problem List   Diagnosis Date Noted   Tennis Must Quervain's disease (radial styloid tenosynovitis) 11/27/2018   Vertigo 10/18/2018   Common migraine with intractable migraine 07/06/2018   Diabetes mellitus type 2 in obese ([place]) 06/29/2018   Spondyloarthropathy 07/26/2016   High risk medication use 07/26/2016   Chronic low back pain 07/26/2016   H/O total knee replacement, bilateral 07/26/2016   Chronic diastolic heart failure ([place]) 11/29/2015   Dyspnea 06/03/2015   Lung nodule seen on imaging study, CTA of chest, 6 mm nodule, followed by Dr. Virgina Jock 03/25/2013   Family history of coronary artery disease 03/12/2013   Super  obese 03/12/2013   Chest pain with low risk of acute coronary syndrome - most likely musculoskeletal, negative nuc study 03/11/2013   Full thickness rotator cuff tear 05/15/2012   Hypercholesterolemia    DJD (degenerative joint disease), cervical    OSA (obstructive sleep apnea)    Essential hypertension    History of TIA (transient ischemic attack) 9/13    Partial tear of rotator cuff(726.13)    Shoulder impingement 04/12/2012    Past Medical History:  Diagnosis Date   Anemia    takes iron supplement   Anxiety    Asthma    daily and prn inhalers   Bone spur    Left foot   Chest pain    a. 10/2015: NST showing possible mild ischemia but most consistent with breast attenuation. Low-risk study.    Chronic diastolic CHF (congestive heart failure) (HCC)    Common migraine with intractable migraine 07/06/2018   DDD (degenerative disc disease), cervical    Degenerative disc disease    Depression    Diabetes mellitus ([place])    Essential hypertension    Family history of early CAD    Former tobacco use    GERD (gastroesophageal reflux disease)    GERD (gastroesophageal reflux disease)    Headache(784.0)    migraine-like   History of TIA (transient ischemic attack) early 2013   no weakness or deficits   History of UTI    Hyperlipidemia    Insomnia    Migraines    Morbid obesity (HCC)    Partial tear of rotator cuff(726.13)    Psoriatic arthritis (HCC)    Shoulder impingement  04/2012   left   Sleep apnea    uses CPAP nightly    Family History  Problem Relation Age of Onset   Diabetes Mother    Hypertension Mother    Kidney disease Father    Hypertension Father    Diabetes Father    Breast cancer Sister    Diabetes Sister    Breast cancer Sister    Cancer Maternal Grandmother    Diabetes Maternal Grandmother    Hypertension Maternal Grandmother    Diabetes Paternal Grandfather    Cancer Paternal Grandfather    Coronary artery disease Sister    Diabetes Sister     Crohn's disease Maternal Aunt    Crohn's disease Cousin    Crohn's disease Cousin    Crohn's disease Paternal Aunt    Past Surgical History:  Procedure Laterality Date   ABDOMINAL HYSTERECTOMY  03/28/2002   complete   ANTERIOR CERVICAL DECOMP/DISCECTOMY FUSION  02/18/2010   C6-7   BREAST BIOPSY Right    DILATION AND CURETTAGE OF UTERUS     2000   FOOT SURGERY Right 2015   KNEE ARTHROPLASTY     ROTATOR CUFF REPAIR Left 2013   TOTAL KNEE ARTHROPLASTY  09/07/2009   left   TOTAL KNEE ARTHROPLASTY  10/13/2008   right   TOTAL SHOULDER ARTHROPLASTY     UMBILICAL HERNIA REPAIR  03/28/2002   wisidom     WRIST SURGERY  2020   Social History   Social History Narrative   Lives with husband, John   Caffeine use: 1 cup coffee every morning   Right handed    Immunization History  Administered Date(s) Administered   Influenza-Unspecified 06/05/2018, 06/21/2021   PFIZER(Purple Top)SARS-COV-2 Vaccination 11/28/2019, 12/18/2019, 06/02/2020, 02/21/2021     Objective: Vital Signs: There were no vitals taken for this visit.   Physical Exam Vitals and nursing note reviewed.  Constitutional:      Appearance: She is well-developed.  HENT:     Head: Normocephalic and atraumatic.  Eyes:     Conjunctiva/sclera: Conjunctivae normal.  Cardiovascular:     Rate and Rhythm: Normal rate and regular rhythm.     Heart sounds: Normal heart sounds.  Pulmonary:     Effort: Pulmonary effort is normal.     Breath sounds: Normal breath sounds.  Abdominal:     General: Bowel sounds are normal.     Palpations: Abdomen is soft.  Musculoskeletal:     Cervical back: Normal range of motion.  Skin:    General: Skin is warm and dry.     Capillary Refill: Capillary refill takes less than 2 seconds.  Neurological:     Mental Status: She is alert and oriented to person, place, and time.  Psychiatric:        Behavior: Behavior normal.      Musculoskeletal Exam: ***  CDAI Exam: CDAI Score: -- Patient  Global: --; Provider Global: -- Swollen: --; Tender: -- Joint Exam 10/03/2022   No joint exam has been documented for this visit   There is currently no information documented on the homunculus. Go to the Rheumatology activity and complete the homunculus joint exam.  Investigation: No additional findings.  Imaging: No results found.  Recent Labs: Lab Results  Component Value Date   WBC 7.7 06/15/2022   HGB 12.3 06/15/2022   PLT 234 06/15/2022   NA 141 06/15/2022   K 4.3 06/15/2022   CL 108 06/15/2022   CO2 25 06/15/2022   GLUCOSE 89  06/15/2022   BUN 12 06/15/2022   CREATININE 0.86 06/15/2022   BILITOT 0.3 06/15/2022   ALKPHOS 119 06/27/2018   AST 14 06/15/2022   ALT 15 06/15/2022   PROT 6.8 06/15/2022   ALBUMIN 3.6 06/27/2018   CALCIUM 9.9 06/15/2022   GFRAA 79 01/20/2021   QFTBGOLDPLUS NEGATIVE 01/10/2022    Speciality Comments: Patient recieves Cosentyx through [employer] 04/29/2019  Procedures:  No procedures performed Allergies: Patient has no known allergies.   Assessment / Plan:     Visit Diagnoses: No diagnosis found.  Orders: No orders of the defined types were placed in this encounter.  No orders of the defined types were placed in this encounter.   Face-to-face time spent with patient was *** minutes. Greater than 50% of time was spent in counseling and coordination of care.  Follow-Up Instructions: No follow-ups on file.   Earnestine Mealing, CMA  Note - This record has been created using Editor, commissioning.  Chart creation errors have been sought, but may not always  have been located. Such creation errors do not reflect on  the standard of medical care.

## 2022-09-29 ENCOUNTER — Other Ambulatory Visit: Payer: Self-pay | Admitting: *Deleted

## 2022-09-29 DIAGNOSIS — Z79899 Other long term (current) drug therapy: Secondary | ICD-10-CM

## 2022-09-30 LAB — CBC WITH DIFFERENTIAL/PLATELET
Absolute Monocytes: 615 cells/uL (ref 200–950)
Basophils Absolute: 30 cells/uL (ref 0–200)
Basophils Relative: 0.4 %
Eosinophils Absolute: 218 cells/uL (ref 15–500)
Eosinophils Relative: 2.9 %
HCT: 39.3 % (ref 35.0–45.0)
Hemoglobin: 12.6 g/dL (ref 11.7–15.5)
Lymphs Abs: 1680 cells/uL (ref 850–3900)
MCH: 26.7 pg — ABNORMAL LOW (ref 27.0–33.0)
MCHC: 32.1 g/dL (ref 32.0–36.0)
MCV: 83.3 fL (ref 80.0–100.0)
MPV: 10.3 fL (ref 7.5–12.5)
Monocytes Relative: 8.2 %
Neutro Abs: 4958 cells/uL (ref 1500–7800)
Neutrophils Relative %: 66.1 %
Platelets: 258 10*3/uL (ref 140–400)
RBC: 4.72 10*6/uL (ref 3.80–5.10)
RDW: 14.7 % (ref 11.0–15.0)
Total Lymphocyte: 22.4 %
WBC: 7.5 10*3/uL (ref 3.8–10.8)

## 2022-09-30 LAB — COMPLETE METABOLIC PANEL WITH GFR
AG Ratio: 1.5 (calc) (ref 1.0–2.5)
ALT: 14 U/L (ref 6–29)
AST: 13 U/L (ref 10–35)
Albumin: 4.2 g/dL (ref 3.6–5.1)
Alkaline phosphatase (APISO): 97 U/L (ref 37–153)
BUN: 13 mg/dL (ref 7–25)
CO2: 22 mmol/L (ref 20–32)
Calcium: 9.5 mg/dL (ref 8.6–10.4)
Chloride: 113 mmol/L — ABNORMAL HIGH (ref 98–110)
Creat: 0.91 mg/dL (ref 0.50–1.05)
Globulin: 2.8 g/dL (calc) (ref 1.9–3.7)
Glucose, Bld: 88 mg/dL (ref 65–99)
Potassium: 4.7 mmol/L (ref 3.5–5.3)
Sodium: 142 mmol/L (ref 135–146)
Total Bilirubin: 0.3 mg/dL (ref 0.2–1.2)
Total Protein: 7 g/dL (ref 6.1–8.1)
eGFR: 72 mL/min/{1.73_m2} (ref >=60)

## 2022-09-30 NOTE — Progress Notes (Signed)
CBC and CMP WNL.

## 2022-10-03 ENCOUNTER — Ambulatory Visit: Payer: Medicare HMO | Attending: Physician Assistant | Admitting: Physician Assistant

## 2022-10-03 DIAGNOSIS — M47819 Spondylosis without myelopathy or radiculopathy, site unspecified: Secondary | ICD-10-CM

## 2022-10-03 DIAGNOSIS — R911 Solitary pulmonary nodule: Secondary | ICD-10-CM

## 2022-10-03 DIAGNOSIS — Z79899 Other long term (current) drug therapy: Secondary | ICD-10-CM

## 2022-10-03 DIAGNOSIS — M5030 Other cervical disc degeneration, unspecified cervical region: Secondary | ICD-10-CM

## 2022-10-03 DIAGNOSIS — Z8669 Personal history of other diseases of the nervous system and sense organs: Secondary | ICD-10-CM

## 2022-10-03 DIAGNOSIS — Z96653 Presence of artificial knee joint, bilateral: Secondary | ICD-10-CM

## 2022-10-03 DIAGNOSIS — G8929 Other chronic pain: Secondary | ICD-10-CM

## 2022-10-03 DIAGNOSIS — M7062 Trochanteric bursitis, left hip: Secondary | ICD-10-CM

## 2022-10-03 DIAGNOSIS — Z8673 Personal history of transient ischemic attack (TIA), and cerebral infarction without residual deficits: Secondary | ICD-10-CM

## 2022-10-03 DIAGNOSIS — Z8639 Personal history of other endocrine, nutritional and metabolic disease: Secondary | ICD-10-CM

## 2022-10-03 DIAGNOSIS — Z8679 Personal history of other diseases of the circulatory system: Secondary | ICD-10-CM

## 2022-10-03 DIAGNOSIS — I5032 Chronic diastolic (congestive) heart failure: Secondary | ICD-10-CM

## 2022-10-03 DIAGNOSIS — M461 Sacroiliitis, not elsewhere classified: Secondary | ICD-10-CM

## 2022-10-04 NOTE — Progress Notes (Signed)
Office Visit Note  Patient: [name]             Date of Birth: 10/18/1960           MRN: [id number]             PCP: Creola Corn, MD Referring: Creola Corn, MD Visit Date: 10/05/2022 Occupation: @GUAROCC @  Subjective:  Right IT band pain   History of Present Illness: [name] is a 62 y.o. female with history of spondyloarthropathy and DDD. Patient is on Taltz 80 mg sq injections every 4 weeks.  She has not missed any doses of Taltz recently.  She denies any recent injection site reactions or side effects.  She has not had any recent or recurrent infections.  She denies any signs or symptoms of a flare.  She is not currently experiencing any increased SI joint pain.  She denies any joint swelling.  She denies any Achilles tendinitis or plantar fasciitis.  She has had some increased discomfort along the right IT band.  She has been using Salonpas patches topically for pain relief. She is due for her next dose of Taltz on 10/12/22.  She denies any new medical conditions.       Activities of Daily Living:  Patient reports morning stiffness for 20 minutes.   Patient Reports nocturnal pain.  Difficulty dressing/grooming: Denies Difficulty climbing stairs: Denies Difficulty getting out of chair: Denies Difficulty using hands for taps, buttons, cutlery, and/or writing: Denies  Review of Systems  Constitutional:  Positive for fatigue.  HENT:  Negative for mouth sores, mouth dryness and nose dryness.   Eyes:  Negative for pain, visual disturbance and dryness.  Respiratory:  Negative for cough, hemoptysis and difficulty breathing.   Cardiovascular:  Positive for chest pain and palpitations. Negative for hypertension and swelling in legs/feet.  Gastrointestinal:  Positive for constipation. Negative for blood in stool and diarrhea.  Endocrine: Negative for increased urination.  Genitourinary:  Negative for painful urination and involuntary urination.   Musculoskeletal:  Positive for morning stiffness. Negative for joint pain, gait problem, joint pain, joint swelling, myalgias, muscle weakness, muscle tenderness and myalgias.  Skin:  Positive for hair loss. Negative for color change, pallor, rash, nodules/bumps, skin tightness, ulcers and sensitivity to sunlight.  Allergic/Immunologic: Negative for susceptible to infections.  Neurological:  Positive for dizziness. Negative for numbness, headaches and weakness.  Hematological:  Negative for swollen glands.  Psychiatric/Behavioral:  Negative for depressed mood and sleep disturbance. The patient is not nervous/anxious.     PMFS History:  Patient Active Problem List   Diagnosis Date Noted   Tommi Rumps Quervain's disease (radial styloid tenosynovitis) 11/27/2018   Vertigo 10/18/2018   Common migraine with intractable migraine 07/06/2018   Diabetes mellitus type 2 in obese (HCC) 06/29/2018   Spondyloarthropathy 07/26/2016   High risk medication use 07/26/2016   Chronic low back pain 07/26/2016   H/O total knee replacement, bilateral 07/26/2016   Chronic diastolic heart failure (HCC) 11/29/2015   Dyspnea 06/03/2015   Lung nodule seen on imaging study, CTA of chest, 6 mm nodule, followed by Dr. Timothy Lasso 03/25/2013   Family history of coronary artery disease 03/12/2013   Super obese 03/12/2013   Chest pain with low risk of acute coronary syndrome - most likely musculoskeletal, negative nuc study 03/11/2013   Full thickness rotator cuff tear 05/15/2012   Hypercholesterolemia    DJD (degenerative joint disease), cervical    OSA (obstructive sleep apnea)    Essential hypertension  History of TIA (transient ischemic attack) 9/13    Partial tear of rotator cuff(726.13)    Shoulder impingement 04/12/2012    Past Medical History:  Diagnosis Date   Anemia    takes iron supplement   Anxiety    Asthma    daily and prn inhalers   Bone spur    Left foot   Chest pain    a. 10/2015: NST showing  possible mild ischemia but most consistent with breast attenuation. Low-risk study.    Chronic diastolic CHF (congestive heart failure) (HCC)    Common migraine with intractable migraine 07/06/2018   DDD (degenerative disc disease), cervical    Degenerative disc disease    Depression    Diabetes mellitus (HCC)    Essential hypertension    Family history of early CAD    Former tobacco use    GERD (gastroesophageal reflux disease)    GERD (gastroesophageal reflux disease)    Headache(784.0)    migraine-like   History of TIA (transient ischemic attack) early 2013   no weakness or deficits   History of UTI    Hyperlipidemia    Insomnia    Migraines    Morbid obesity (HCC)    Partial tear of rotator cuff(726.13)    Psoriatic arthritis (HCC)    Shoulder impingement 04/2012   left   Sleep apnea    uses CPAP nightly    Family History  Problem Relation Age of Onset   Diabetes Mother    Hypertension Mother    Kidney disease Father    Hypertension Father    Diabetes Father    Breast cancer Sister    Diabetes Sister    Breast cancer Sister    Cancer Maternal Grandmother    Diabetes Maternal Grandmother    Hypertension Maternal Grandmother    Diabetes Paternal Grandfather    Cancer Paternal Grandfather    Coronary artery disease Sister    Diabetes Sister    Crohn's disease Maternal Aunt    Crohn's disease Cousin    Crohn's disease Cousin    Crohn's disease Paternal Aunt    Past Surgical History:  Procedure Laterality Date   ABDOMINAL HYSTERECTOMY  03/28/2002   complete   ANTERIOR CERVICAL DECOMP/DISCECTOMY FUSION  02/18/2010   C6-7   BREAST BIOPSY Right    DILATION AND CURETTAGE OF UTERUS     2000   FOOT SURGERY Right 2015   KNEE ARTHROPLASTY     ROTATOR CUFF REPAIR Left 2013   TOTAL KNEE ARTHROPLASTY  09/07/2009   left   TOTAL KNEE ARTHROPLASTY  10/13/2008   right   TOTAL SHOULDER ARTHROPLASTY     UMBILICAL HERNIA REPAIR  03/28/2002   [facility]     WRIST SURGERY  2020    Social History   Social History Narrative   Lives with husband, John   Caffeine use: 1 cup coffee every morning   Right handed    Immunization History  Administered Date(s) Administered   Influenza-Unspecified 06/05/2018, 06/21/2021   PFIZER(Purple Top)SARS-COV-2 Vaccination 11/28/2019, 12/18/2019, 06/02/2020, 02/21/2021     Objective: Vital Signs: BP 138/77 (BP Location: Left Arm, Patient Position: Sitting, Cuff Size: Normal)   Pulse 81   Resp 16   Ht 5\' 2"  (1.575 m)   Wt 264 lb 6.4 oz (119.9 kg)   BMI 48.36 kg/m    Physical Exam Vitals and nursing note reviewed.  Constitutional:      Appearance: She is well-developed.  HENT:  Head: Normocephalic and atraumatic.  Eyes:     Conjunctiva/sclera: Conjunctivae normal.  Cardiovascular:     Rate and Rhythm: Normal rate and regular rhythm.     Heart sounds: Normal heart sounds.  Pulmonary:     Effort: Pulmonary effort is normal.     Breath sounds: Normal breath sounds.  Abdominal:     General: Bowel sounds are normal.     Palpations: Abdomen is soft.  Musculoskeletal:     Cervical back: Normal range of motion.  Skin:    General: Skin is warm and dry.     Capillary Refill: Capillary refill takes less than 2 seconds.  Neurological:     Mental Status: She is alert and oriented to person, place, and time.  Psychiatric:        Behavior: Behavior normal.      Musculoskeletal Exam: C-spine is limited range of motion with lateral rotation.  No midline spinal tenderness currently.  No SI joint tenderness upon palpation today.  Shoulder joints have good range of motion with some crepitus in the left shoulder.  Elbow joints, wrist joints, MCPs, PIPs, DIPs have good range of motion with no synovitis.  Complete fist formation bilaterally.  Hip joints have good range of motion with no groin pain.  Tenderness over the left trochanteric bursa.  Tenderness along the IT band on the right side.  Bilateral knee replacements have good  range of motion with no discomfort.  Ankle joints have good range of motion with no tenderness or synovitis.  No evidence of Achilles tendinitis or plantar fasciitis.  CDAI Exam: CDAI Score: -- Patient Global: --; Provider Global: -- Swollen: --; Tender: -- Joint Exam 10/05/2022   No joint exam has been documented for this visit   There is currently no information documented on the homunculus. Go to the Rheumatology activity and complete the homunculus joint exam.  Investigation: No additional findings.  Imaging: No results found.  Recent Labs: Lab Results  Component Value Date   WBC 7.5 09/29/2022   HGB 12.6 09/29/2022   PLT 258 09/29/2022   NA 142 09/29/2022   K 4.7 09/29/2022   CL 113 (H) 09/29/2022   CO2 22 09/29/2022   GLUCOSE 88 09/29/2022   BUN 13 09/29/2022   CREATININE 0.91 09/29/2022   BILITOT 0.3 09/29/2022   ALKPHOS 119 06/27/2018   AST 13 09/29/2022   ALT 14 09/29/2022   PROT 7.0 09/29/2022   ALBUMIN 3.6 06/27/2018   CALCIUM 9.5 09/29/2022   GFRAA 79 01/20/2021   QFTBGOLDPLUS NEGATIVE 01/10/2022    Speciality Comments: Patient recieves Cosentyx through [employer] 04/29/2019  Procedures:  No procedures performed Allergies: Patient has no known allergies.   Assessment / Plan:     Visit Diagnoses: Spondyloarthropathy: MRI positive for sacroiliitis, HLA-B27 negative, elevated ESR: She has not had any signs or symptoms of a flare recently.  Overall she has clinically been doing well on Taltz 80 mg subcutaneous injections every 28 days.  She continues to tolerate Taltz without any side effects or injection site reactions.  She has not had any recent or recurrent infections.  No recent gaps in therapy.  She has no evidence of Achilles tendinitis or plantar fasciitis.  No synovitis or dactylitis noted.  She had no midline spinal tenderness upon palpation.  She continues to have some stiffness in her C-spine and occasional discomfort  in her SI joints but overall her symptoms have been stable.  She will remain on [employer]  as monotherapy.  She was advised to notify us if she develops signs or symptoms of a flare.  She will follow-up in the office in 5 months or sooner if needed.  High risk medication use - Taltz 80 mg sq injections every 28 days.  Due for next Taltz dose on 10/12/22.  CBC and CMP updated on 09/29/22.  Her next lab work will be due in April and every 3 months.  Standing orders for CBC and CMP remain in place. TB gold negative on 01/10/22.  Future order for TB gold placed today.  No recent or recurrent infections.  Discussed the importance of holding Taltz if she develops signs or symptoms of an infection and to resume once the infection has completely cleared.  No new medical conditions.Plan: QuantiFERON-TB Gold Plus  Screening for tuberculosis -Future order for TB gold placed today.  Plan: QuantiFERON-TB Gold Plus  Sacroiliitis (HCC): Intermittently symptomatic.  No tenderness upon palpation today.  DDD (degenerative disc disease), cervical: C-spine has slightly limited range of motion with lateral rotation.  No symptoms of radiculopathy.  Iliotibial band syndrome of right side: She presents today with tenderness along the right IT band.  She has been having soreness upon palpation for the past 2 weeks.  No recent injury or fall.  No mass palpated.  She has been using a Salonpas patch for pain relief.  Different treatment options were discussed today.  Patient was encouraged to perform stretching exercises daily and continue to use topical agents.  If her symptoms persist or worsen I recommend referral to physical therapy.  Trochanteric bursitis, left hip: She has tenderness palpation over the left trochanteric bursa.  She was encouraged to perform stretching exercises on a daily basis.  H/O total knee replacement, bilateral: Doing well.  Good range of motion with no warmth or effusion.  Other medical conditions are  listed as follows:  Lung nodule seen on imaging study, CTA of chest, 6 mm nodule, followed by Dr. Timothy Lasso  History of hypertension - BP was 138/77 today in the office.  History of TIA (transient ischemic attack)  Chronic diastolic heart failure (HCC)  History of sleep apnea  History of hyperlipidemia  Medial epicondylitis of both elbows  History of diabetes mellitus  Trapezius muscle spasm  Orders: Orders Placed This Encounter  Procedures   QuantiFERON-TB Gold Plus   No orders of the defined types were placed in this encounter.   Follow-Up Instructions: Return in about 5 months (around 03/06/2023) for Spondyloarthropathy, DDD.   Gearldine Bienenstock, PA-C  Note - This record has been created using Dragon software.  Chart creation errors have been sought, but may not always  have been located. Such creation errors do not reflect on  the standard of medical care.

## 2022-10-05 ENCOUNTER — Encounter: Payer: Self-pay | Admitting: Physician Assistant

## 2022-10-05 ENCOUNTER — Ambulatory Visit: Payer: Medicare HMO | Attending: Physician Assistant | Admitting: Physician Assistant

## 2022-10-05 VITALS — BP 138/77 | HR 81 | Resp 16 | Ht 62.0 in | Wt 264.4 lb

## 2022-10-05 DIAGNOSIS — Z111 Encounter for screening for respiratory tuberculosis: Secondary | ICD-10-CM

## 2022-10-05 DIAGNOSIS — M7062 Trochanteric bursitis, left hip: Secondary | ICD-10-CM | POA: Diagnosis not present

## 2022-10-05 DIAGNOSIS — M62838 Other muscle spasm: Secondary | ICD-10-CM

## 2022-10-05 DIAGNOSIS — Z8673 Personal history of transient ischemic attack (TIA), and cerebral infarction without residual deficits: Secondary | ICD-10-CM | POA: Diagnosis not present

## 2022-10-05 DIAGNOSIS — Z79899 Other long term (current) drug therapy: Secondary | ICD-10-CM

## 2022-10-05 DIAGNOSIS — Z96653 Presence of artificial knee joint, bilateral: Secondary | ICD-10-CM | POA: Diagnosis not present

## 2022-10-05 DIAGNOSIS — G8929 Other chronic pain: Secondary | ICD-10-CM

## 2022-10-05 DIAGNOSIS — Z8679 Personal history of other diseases of the circulatory system: Secondary | ICD-10-CM

## 2022-10-05 DIAGNOSIS — M461 Sacroiliitis, not elsewhere classified: Secondary | ICD-10-CM

## 2022-10-05 DIAGNOSIS — Z8639 Personal history of other endocrine, nutritional and metabolic disease: Secondary | ICD-10-CM

## 2022-10-05 DIAGNOSIS — M5030 Other cervical disc degeneration, unspecified cervical region: Secondary | ICD-10-CM | POA: Diagnosis not present

## 2022-10-05 DIAGNOSIS — M7631 Iliotibial band syndrome, right leg: Secondary | ICD-10-CM

## 2022-10-05 DIAGNOSIS — M47819 Spondylosis without myelopathy or radiculopathy, site unspecified: Secondary | ICD-10-CM | POA: Diagnosis not present

## 2022-10-05 DIAGNOSIS — I5032 Chronic diastolic (congestive) heart failure: Secondary | ICD-10-CM

## 2022-10-05 DIAGNOSIS — R911 Solitary pulmonary nodule: Secondary | ICD-10-CM | POA: Diagnosis not present

## 2022-10-05 DIAGNOSIS — M7702 Medial epicondylitis, left elbow: Secondary | ICD-10-CM

## 2022-10-05 DIAGNOSIS — Z8669 Personal history of other diseases of the nervous system and sense organs: Secondary | ICD-10-CM

## 2022-10-05 DIAGNOSIS — M7701 Medial epicondylitis, right elbow: Secondary | ICD-10-CM

## 2022-10-05 NOTE — Patient Instructions (Addendum)
Standing Labs We placed an order today for your standing lab work.   Please have your standing labs drawn in April and every 3 months   Please have your labs drawn 2 weeks prior to your appointment so that the provider can discuss your lab results at your appointment.  Please note that you may see your imaging and lab results in [place] before we have reviewed them. We will contact you once all results are reviewed. Please allow our office up to 72 hours to thoroughly review all of the results before contacting the office for clarification of your results.  Lab hours are:   Monday through Thursday from 8:00 am -12:30 pm and 1:00 pm-5:00 pm and Friday from 8:00 am-12:00 pm.  Please be advised, all patients with office appointments requiring lab work will take precedent over walk-in lab work.   Labs are drawn by [facility]. Please bring your co-pay at the time of your lab draw.  You may receive a bill from [facility] for your lab work.  Please note if you are on Hydroxychloroquine and and an order has been placed for a Hydroxychloroquine level, you will need to have it drawn 4 hours or more after your last dose.  If you wish to have your labs drawn at another location, please call the office 24 hours in advance so we can fax the orders.  The office is located at [address] [place], [place], [place] [address] No appointment is necessary.    If you have any questions regarding directions or hours of operation,  please call [phone].   As a reminder, please drink plenty of water prior to coming for your lab work. Thanks!   Iliotibial Band Syndrome  Iliotibial band syndrome is a condition that often causes knee pain. It can also cause pain in the outside of the hip, thigh, and knee. The iliotibial band is a strip of tissue in each of the legs. This band runs from the outside of the hip and down the thigh to the outside of the knee. Repeatedly bending and straightening your knee can  irritate your iliotibial band. What are the causes? This condition is caused by inflammation from rubbing (friction) of the iliotibial band as it moves over the thigh bone (femur) when you bend and straighten your knee again and again. What increases the risk? You are more likely to develop this condition if: You change elevation often while running on a treadmill. You run very long distances. You recently increased the length or intensity of your workouts. Intensity means how much effort you put in. You run downhill often, or you just started running downhill. You ride a bike very far or often. You may also be at greater risk if: You start a new workout routine without first warming up your muscles. You have a job that requires you to bend, squat, or climb often. What are the signs or symptoms? Symptoms of this condition include: Pain along the outside of your knee that may be worse with activity, especially running or going up and down stairs. A feeling like a snap over your knee or hip. Swelling on the outside of your knee. Pain or a feeling of tightness in your hip. How is this diagnosed? This condition is diagnosed based on: Your symptoms. Your medical history. A physical exam. You may also see a health care provider who specializes in reducing pain and improving movement (physical therapist). A physical therapist may do an exam to check your  balance, movement, and way of walking or running (gait) to see whether the way you move could add to your injury. You may also have tests to measure your strength, flexibility, and range of motion. How is this treated? This condition may be treated by: Taking NSAIDs, such as ibuprofen, to help relieve pain and swelling. Resting and limiting exercise. Returning to activities gradually. Doing exercises to improve movement and strength (physical therapy) as told by your health care provider. Having an injection of steroid medicine. This is  medicine that helps relieve inflammation. Having surgery. This may be done if your symptoms do not improve after other treatments. Follow these instructions at home: Managing pain, stiffness, and swelling If directed, put ice on the injured area. To do this: Put ice in a plastic bag. Place a towel between your skin and the bag. Leave the ice on for 20 minutes, 2-3 times a day. Remove the ice if your skin turns bright red. This is very important. If you cannot feel pain, heat, or cold, you have a greater risk of damage to the area.  Activity Return to your normal activities as told by your health care provider. Ask your health care provider what activities are safe for you. Include low-impact activities, such as swimming, in your exercise routine. Check with your health care provider to make sure running is safe for you. Do exercises as told by your health care provider. General instructions Take over-the-counter and prescription medicines only as told by your health care provider. Make sure you wear shoes that fit well and have good cushioning and arch support. Keep all follow-up visits. This is important. How is this prevented? Warm up and stretch before being active. Cool down and stretch after being active. Give your body time to rest between periods of activity. Consider getting help from a coach or trainer to come up with a safe running plan and a plan to advance (progress) your training that fits your goals and ability. Change directions often while running around a track. Replace your shoes when the soles are worn out. Maintain physical fitness. This includes strength and flexibility. Contact a health care provider if: Your pain does not improve. Your pain gets worse even with treatment. Summary Iliotibial band syndrome is a condition that often causes knee pain. It can also cause pain in the outside of your hip, thigh, and knee. Treatment includes taking NSAIDs, resting,  returning to activities gradually, and doing physical therapy exercises. Return to your normal activities as told by your health care provider. Ask your health care provider what activities are safe for you. This information is not intended to replace advice given to you by your health care provider. Make sure you discuss any questions you have with your health care provider. Document Revised: 12/30/2019 Document Reviewed: 12/30/2019 Elsevier Patient Education  [place] [facility] Syndrome [facility] Ask your health care provider which exercises are safe for you. Do exercises exactly as told by your health care provider and adjust them as directed. It is normal to feel mild stretching, pulling, tightness, or discomfort as you do these exercises. Stop right away if you feel sudden pain or your pain gets significantly worse. Do not begin these exercises until told by your health care provider. Stretching and range-of-motion exercises These exercises warm up your muscles and joints and improve the movement and flexibility of your hip and pelvis. Quadriceps stretch, prone  Lie on your abdomen (prone position) on a firm surface,  such as a bed or padded floor. Bend your left / right knee and reach back to hold your ankle or pant leg. If you cannot reach your ankle or pant leg, loop a belt around your foot and grab the belt instead. Gently pull your heel toward your buttocks. Your knee should not slide out to the side. You should feel a stretch in the front of your thigh and knee (quadriceps). Hold this position for __________ seconds. Repeat __________ times. Complete this exercise __________ times a day. Iliotibial band stretch An iliotibial band is a strong band of muscle tissue that runs from the outer side of your hip to the outer side of your thigh and knee. Lie on your side with your left / right leg in the top position. Bend both of your knees and grab your left / right ankle.  Stretch out your bottom arm to help you balance. Slowly bring your top knee back so your thigh goes behind your trunk. Slowly lower your top leg toward the floor until you feel a gentle stretch on the outside of your left / right hip and thigh. If you do not feel a stretch and your knee will not fall farther, place the heel of your other foot on top of your knee and pull your knee down toward the floor with your foot. Hold this position for __________ seconds. Repeat __________ times. Complete this exercise __________ times a day. Strengthening exercises These exercises build strength and endurance in your hip and pelvis. Endurance is the ability to use your muscles for a long time, even after they get tired. Straight leg raises, side-lying This exercise strengthens the muscles that rotate the leg at the hip and move it away from your body (hip abductors). Lie on your side with your left / right leg in the top position. Lie so your head, shoulder, hip, and knee line up. You may bend your bottom knee to help you balance. Roll your hips slightly forward so your hips are stacked directly over each other and your left / right knee is facing forward. Tense the muscles in your outer thigh and lift your top leg 4-6 inches (10-15 cm). Hold this position for __________ seconds. Slowly lower your leg to return to the starting position. Let your muscles relax completely before doing another repetition. Repeat __________ times. Complete this exercise __________ times a day. Leg raises, prone This exercise strengthens the muscles that move the hips backward (hip extensors). Lie on your abdomen (prone position) on your bed or a firm surface. You can put a pillow under your hips if that is more comfortable for your lower back. Bend your left / right knee so your foot is straight up in the air. Squeeze your buttocks muscles and lift your left / right thigh off the bed. Do not let your back arch. Tense your thigh  muscle as hard as you can without increasing any knee pain. Hold this position for __________ seconds. Slowly lower your leg to return to the starting position and allow it to relax completely. Repeat __________ times. Complete this exercise __________ times a day. Hip hike Stand sideways on a bottom step. Stand on your left / right leg with your other foot unsupported next to the step. You can hold on to a railing or wall for balance if needed. Keep your knees straight and your torso square. Then lift your left / right hip up toward the ceiling. Slowly let your left / right hip  lower toward the floor, past the starting position. Your foot should get closer to the floor. Do not lean or bend your knees. Repeat __________ times. Complete this exercise __________ times a day. This information is not intended to replace advice given to you by your health care provider. Make sure you discuss any questions you have with your health care provider. Document Revised: 11/06/2019 Document Reviewed: 11/06/2019 Elsevier Patient Education  [place].

## 2022-10-11 ENCOUNTER — Telehealth: Payer: Self-pay

## 2022-10-11 NOTE — Telephone Encounter (Signed)
Patient called stating she has not received taltz from patient assistance after she was told last week they were expediting the rx. Patient is due to take her injection and requested a sample. I advised patient to call and follow up with the patient assistance in regards to the prescription. I also advised patient she could stop by the office for a sample. Sample has been reserved in the fridge.   Next Visit: 03/06/2023  Last Visit: 10/05/2022  [id number]   Current Dose per office note on 10/05/2022: Taltz 80 mg sq injections every 28 days.   Labs: 09/29/2022 CBC and CMP WNL   TB Gold: 01/10/2022 negative

## 2022-10-11 NOTE — Telephone Encounter (Signed)
Medication Samples have been provided to the patient.  Drug name: Donnetta Hail       Strength: '80mg'$ /mL        Qty: 1  LOT: [id number]  Exp.Date: 05/12/2023  Dosing instructions: Inject '80mg'$  into the skin every 28 days.

## 2022-10-14 DIAGNOSIS — K219 Gastro-esophageal reflux disease without esophagitis: Secondary | ICD-10-CM | POA: Diagnosis not present

## 2022-10-14 DIAGNOSIS — E785 Hyperlipidemia, unspecified: Secondary | ICD-10-CM | POA: Diagnosis not present

## 2022-10-14 DIAGNOSIS — E119 Type 2 diabetes mellitus without complications: Secondary | ICD-10-CM | POA: Diagnosis not present

## 2022-10-21 DIAGNOSIS — I129 Hypertensive chronic kidney disease with stage 1 through stage 4 chronic kidney disease, or unspecified chronic kidney disease: Secondary | ICD-10-CM | POA: Diagnosis not present

## 2022-10-21 DIAGNOSIS — D8989 Other specified disorders involving the immune mechanism, not elsewhere classified: Secondary | ICD-10-CM | POA: Diagnosis not present

## 2022-10-21 DIAGNOSIS — M461 Sacroiliitis, not elsewhere classified: Secondary | ICD-10-CM | POA: Diagnosis not present

## 2022-10-21 DIAGNOSIS — N182 Chronic kidney disease, stage 2 (mild): Secondary | ICD-10-CM | POA: Diagnosis not present

## 2022-10-21 DIAGNOSIS — E785 Hyperlipidemia, unspecified: Secondary | ICD-10-CM | POA: Diagnosis not present

## 2022-10-21 DIAGNOSIS — I5189 Other ill-defined heart diseases: Secondary | ICD-10-CM | POA: Diagnosis not present

## 2022-10-21 DIAGNOSIS — I700 Atherosclerosis of aorta: Secondary | ICD-10-CM | POA: Diagnosis not present

## 2022-10-21 DIAGNOSIS — E119 Type 2 diabetes mellitus without complications: Secondary | ICD-10-CM | POA: Diagnosis not present

## 2022-10-21 DIAGNOSIS — R82998 Other abnormal findings in urine: Secondary | ICD-10-CM | POA: Diagnosis not present

## 2022-10-21 DIAGNOSIS — Z0000 Encounter for general adult medical examination without abnormal findings: Secondary | ICD-10-CM | POA: Diagnosis not present

## 2022-10-21 DIAGNOSIS — F3341 Major depressive disorder, recurrent, in partial remission: Secondary | ICD-10-CM | POA: Diagnosis not present

## 2022-11-08 DIAGNOSIS — M791 Myalgia, unspecified site: Secondary | ICD-10-CM | POA: Diagnosis not present

## 2022-11-08 DIAGNOSIS — G518 Other disorders of facial nerve: Secondary | ICD-10-CM | POA: Diagnosis not present

## 2022-11-08 DIAGNOSIS — M542 Cervicalgia: Secondary | ICD-10-CM | POA: Diagnosis not present

## 2022-11-08 DIAGNOSIS — G43709 Chronic migraine without aura, not intractable, without status migrainosus: Secondary | ICD-10-CM | POA: Diagnosis not present

## 2022-11-16 ENCOUNTER — Telehealth: Payer: Self-pay | Admitting: *Deleted

## 2022-11-16 NOTE — Telephone Encounter (Signed)
CPAP supply order sent to [facility]. Receipt of order confirmed by Seth Bake.

## 2022-11-29 DIAGNOSIS — G4733 Obstructive sleep apnea (adult) (pediatric): Secondary | ICD-10-CM | POA: Diagnosis not present

## 2022-12-01 DIAGNOSIS — E785 Hyperlipidemia, unspecified: Secondary | ICD-10-CM | POA: Diagnosis not present

## 2022-12-01 DIAGNOSIS — N182 Chronic kidney disease, stage 2 (mild): Secondary | ICD-10-CM | POA: Diagnosis not present

## 2022-12-01 DIAGNOSIS — I129 Hypertensive chronic kidney disease with stage 1 through stage 4 chronic kidney disease, or unspecified chronic kidney disease: Secondary | ICD-10-CM | POA: Diagnosis not present

## 2022-12-01 DIAGNOSIS — I5189 Other ill-defined heart diseases: Secondary | ICD-10-CM | POA: Diagnosis not present

## 2022-12-01 DIAGNOSIS — E119 Type 2 diabetes mellitus without complications: Secondary | ICD-10-CM | POA: Diagnosis not present

## 2022-12-12 ENCOUNTER — Other Ambulatory Visit: Payer: Self-pay | Admitting: *Deleted

## 2022-12-12 DIAGNOSIS — Z111 Encounter for screening for respiratory tuberculosis: Secondary | ICD-10-CM | POA: Diagnosis not present

## 2022-12-12 DIAGNOSIS — Z79899 Other long term (current) drug therapy: Secondary | ICD-10-CM | POA: Diagnosis not present

## 2022-12-12 NOTE — Progress Notes (Signed)
CBC stable

## 2022-12-13 NOTE — Progress Notes (Signed)
CMP WNL

## 2022-12-14 LAB — CBC WITH DIFFERENTIAL/PLATELET
Absolute Monocytes: 511 cells/uL (ref 200–950)
Basophils Absolute: 29 cells/uL (ref 0–200)
Basophils Relative: 0.4 %
Eosinophils Absolute: 212 cells/uL (ref 15–500)
Eosinophils Relative: 2.9 %
HCT: 39 % (ref 35.0–45.0)
Hemoglobin: 12.5 g/dL (ref 11.7–15.5)
Lymphs Abs: 1730 cells/uL (ref 850–3900)
MCH: 26.4 pg — ABNORMAL LOW (ref 27.0–33.0)
MCHC: 32.1 g/dL (ref 32.0–36.0)
MCV: 82.5 fL (ref 80.0–100.0)
MPV: 9.3 fL (ref 7.5–12.5)
Monocytes Relative: 7 %
Neutro Abs: 4818 cells/uL (ref 1500–7800)
Neutrophils Relative %: 66 %
Platelets: 237 10*3/uL (ref 140–400)
RBC: 4.73 10*6/uL (ref 3.80–5.10)
RDW: 14.3 % (ref 11.0–15.0)
Total Lymphocyte: 23.7 %
WBC: 7.3 10*3/uL (ref 3.8–10.8)

## 2022-12-14 LAB — COMPLETE METABOLIC PANEL WITH GFR
AG Ratio: 1.4 (calc) (ref 1.0–2.5)
ALT: 13 U/L (ref 6–29)
AST: 16 U/L (ref 10–35)
Albumin: 4.2 g/dL (ref 3.6–5.1)
Alkaline phosphatase (APISO): 86 U/L (ref 37–153)
BUN: 15 mg/dL (ref 7–25)
CO2: 25 mmol/L (ref 20–32)
Calcium: 9.7 mg/dL (ref 8.6–10.4)
Chloride: 109 mmol/L (ref 98–110)
Creat: 0.91 mg/dL (ref 0.50–1.05)
Globulin: 3 g/dL (calc) (ref 1.9–3.7)
Glucose, Bld: 91 mg/dL (ref 65–99)
Potassium: 4.1 mmol/L (ref 3.5–5.3)
Sodium: 142 mmol/L (ref 135–146)
Total Bilirubin: 0.4 mg/dL (ref 0.2–1.2)
Total Protein: 7.2 g/dL (ref 6.1–8.1)
eGFR: 72 mL/min/{1.73_m2} (ref >=60)

## 2022-12-14 LAB — QUANTIFERON-TB GOLD PLUS
Mitogen-NIL: 7.43 IU/mL
NIL: 0.03 IU/mL
QuantiFERON-TB Gold Plus: NEGATIVE
TB1-NIL: 0.01 IU/mL
TB2-NIL: 0 IU/mL

## 2022-12-14 NOTE — Progress Notes (Signed)
TB Gold negative

## 2023-01-09 ENCOUNTER — Other Ambulatory Visit: Payer: Self-pay | Admitting: Physician Assistant

## 2023-01-09 DIAGNOSIS — M47819 Spondylosis without myelopathy or radiculopathy, site unspecified: Secondary | ICD-10-CM

## 2023-01-09 NOTE — Telephone Encounter (Signed)
Last Fill: 08/11/2022  Labs: 12/12/2022 CBC Stable, CMP WNL  TB Gold: 12/12/2022 negative    Next Visit: 03/06/2023  Last Visit: 10/05/2022  [id number]   Current Dose per office note on 10/05/2022: Taltz 80 mg sq injections every 28 days.   Okay to refill Taltz?

## 2023-02-01 ENCOUNTER — Telehealth: Payer: Self-pay | Admitting: *Deleted

## 2023-02-01 NOTE — Telephone Encounter (Signed)
Patient contacted the office since she has not received her Taltz. Patient states she has contacted the pharmacy and they are sending it out to her and she should have it by Friday. Patient advised it is okay to delay her injection by a couple of days and to take the injection the day she receive it. Patient expressed understanding.

## 2023-02-07 DIAGNOSIS — G43709 Chronic migraine without aura, not intractable, without status migrainosus: Secondary | ICD-10-CM | POA: Diagnosis not present

## 2023-02-07 DIAGNOSIS — G518 Other disorders of facial nerve: Secondary | ICD-10-CM | POA: Diagnosis not present

## 2023-02-07 DIAGNOSIS — M791 Myalgia, unspecified site: Secondary | ICD-10-CM | POA: Diagnosis not present

## 2023-02-07 DIAGNOSIS — M542 Cervicalgia: Secondary | ICD-10-CM | POA: Diagnosis not present

## 2023-02-10 ENCOUNTER — Telehealth: Payer: Self-pay | Admitting: *Deleted

## 2023-02-10 DIAGNOSIS — Z79899 Other long term (current) drug therapy: Secondary | ICD-10-CM

## 2023-02-10 NOTE — Telephone Encounter (Signed)
Patient should get CBC and CMP in 1 month.  If the labs are normal she can resume Taltz as scheduled.

## 2023-02-10 NOTE — Telephone Encounter (Signed)
Patient contacted the office and states she received her Taltz medication a day after it was due. She took the medication the day she received it 02/02/2023. Patient states she has been under an increased amount of stress. She states she then took another Taltz injection on 02/09/2023. Patient would like to know if she should have any concerns or things she should watch for. Please advise.

## 2023-02-10 NOTE — Telephone Encounter (Signed)
Attempted to contact the patient and left message for patient to call the office.  

## 2023-02-10 NOTE — Telephone Encounter (Signed)
Patient advised Patient should get CBC and CMP in 1 month. If the labs are normal she can resume Taltz as scheduled. Patient verbalized understanding.

## 2023-02-10 NOTE — Addendum Note (Signed)
Addended by: Metta Clines on: 02/10/2023 02:12 PM   Modules accepted: Orders

## 2023-02-13 DIAGNOSIS — H5203 Hypermetropia, bilateral: Secondary | ICD-10-CM | POA: Diagnosis not present

## 2023-02-13 DIAGNOSIS — H524 Presbyopia: Secondary | ICD-10-CM | POA: Diagnosis not present

## 2023-02-13 DIAGNOSIS — H52223 Regular astigmatism, bilateral: Secondary | ICD-10-CM | POA: Diagnosis not present

## 2023-02-13 DIAGNOSIS — E119 Type 2 diabetes mellitus without complications: Secondary | ICD-10-CM | POA: Diagnosis not present

## 2023-02-13 DIAGNOSIS — Z135 Encounter for screening for eye and ear disorders: Secondary | ICD-10-CM | POA: Diagnosis not present

## 2023-02-20 NOTE — Progress Notes (Signed)
Office Visit Note  Patient: [name]             Date of Birth: [birth year]           MRN: [id number]             PCP: Creola Corn, MD Referring: Creola Corn, MD Visit Date: 03/06/2023 Occupation: @GUAROCC @  Subjective:  Intentional weight loss   History of Present Illness: [name] is a 62 y.o. female with history of Spondyloarthropathy. Patient is prescribed taltz 80 mg sq injections once every 28 days.  Patient states that she took Taltz on 02/02/2023 and accidentally her next dose was 02/09/2023.  She had updated lab work on 03/03/2023 which was stable.  She denies any side effects or injection site reactions.  She plans on resuming Taltz injections as prescribed. Patient states that she has been working on weight loss and has noticed a significant improvement in her energy level and overall arthralgias.  She is experiencing very little morning stiffness which has improved with weight loss.  She continues to have some midline lower back pain intermittently but denies any SI joint discomfort at this time.  She denies any symptoms of radiculopathy.  She denies any joint swelling at this time.   Activities of Daily Living:  Patient reports morning stiffness for a few minutes.   Patient Denies nocturnal pain.  Difficulty dressing/grooming: Denies Difficulty climbing stairs: Denies Difficulty getting out of chair: Denies Difficulty using hands for taps, buttons, cutlery, and/or writing: Denies  Review of Systems  Constitutional:  Negative for fatigue.  HENT:  Negative for mouth sores and mouth dryness.   Eyes:  Negative for dryness.  Respiratory:  Negative for shortness of breath.   Cardiovascular:  Negative for chest pain and palpitations.  Gastrointestinal:  Negative for blood in stool, constipation and diarrhea.  Endocrine: Negative for increased urination.  Genitourinary:  Negative for involuntary urination.  Musculoskeletal:  Positive for joint pain,  joint pain, myalgias, morning stiffness and myalgias. Negative for gait problem, joint swelling, muscle weakness and muscle tenderness.  Skin:  Negative for color change, rash, hair loss and sensitivity to sunlight.  Allergic/Immunologic: Negative for susceptible to infections.  Neurological:  Positive for headaches. Negative for dizziness.  Hematological:  Negative for swollen glands.  Psychiatric/Behavioral:  Negative for depressed mood and sleep disturbance. The patient is not nervous/anxious.     PMFS History:  Patient Active Problem List   Diagnosis Date Noted   Tommi Rumps Quervain's disease (radial styloid tenosynovitis) 11/27/2018   Vertigo 10/18/2018   Common migraine with intractable migraine 07/06/2018   Diabetes mellitus type 2 in obese 06/29/2018   Spondyloarthropathy 07/26/2016   High risk medication use 07/26/2016   Chronic low back pain 07/26/2016   H/O total knee replacement, bilateral 07/26/2016   Chronic diastolic heart failure (HCC) 11/29/2015   Dyspnea 06/03/2015   Lung nodule seen on imaging study, CTA of chest, 6 mm nodule, followed by Dr. Timothy Lasso 03/25/2013   Family history of coronary artery disease 03/12/2013   Super obese 03/12/2013   Chest pain with low risk of acute coronary syndrome - most likely musculoskeletal, negative nuc study 03/11/2013   Full thickness rotator cuff tear 05/15/2012   Hypercholesterolemia    DJD (degenerative joint disease), cervical    OSA (obstructive sleep apnea)    Essential hypertension    History of TIA (transient ischemic attack) 9/13    Partial tear of rotator cuff(726.13)    Shoulder impingement 04/12/2012  Past Medical History:  Diagnosis Date   Anemia    takes iron supplement   Anxiety    Asthma    daily and prn inhalers   Bone spur    Left foot   Chest pain    a. 10/2015: NST showing possible mild ischemia but most consistent with breast attenuation. Low-risk study.    Chronic diastolic CHF (congestive heart failure)  (HCC)    Common migraine with intractable migraine 07/06/2018   DDD (degenerative disc disease), cervical    Degenerative disc disease    Depression    Diabetes mellitus (HCC)    Essential hypertension    Family history of early CAD    Former tobacco use    GERD (gastroesophageal reflux disease)    GERD (gastroesophageal reflux disease)    Headache(784.0)    migraine-like   History of TIA (transient ischemic attack) early 2013   no weakness or deficits   History of UTI    Hyperlipidemia    Insomnia    Migraines    Morbid obesity (HCC)    Partial tear of rotator cuff(726.13)    Psoriatic arthritis (HCC)    Shoulder impingement 04/2012   left   Sleep apnea    uses CPAP nightly    Family History  Problem Relation Age of Onset   Diabetes Mother    Hypertension Mother    Kidney disease Father    Hypertension Father    Diabetes Father    Breast cancer Sister    Diabetes Sister    Breast cancer Sister    Cancer Maternal Grandmother    Diabetes Maternal Grandmother    Hypertension Maternal Grandmother    Diabetes Paternal Grandfather    Cancer Paternal Grandfather    Coronary artery disease Sister    Diabetes Sister    Crohn's disease Maternal Aunt    Crohn's disease Cousin    Crohn's disease Cousin    Crohn's disease Paternal Aunt    Past Surgical History:  Procedure Laterality Date   ABDOMINAL HYSTERECTOMY  03/28/2002   complete   ANTERIOR CERVICAL DECOMP/DISCECTOMY FUSION  02/18/2010   C6-7   BREAST BIOPSY Right    DILATION AND CURETTAGE OF UTERUS     2000   FOOT SURGERY Right 2015   KNEE ARTHROPLASTY     ROTATOR CUFF REPAIR Left 2013   TOTAL KNEE ARTHROPLASTY  09/07/2009   left   TOTAL KNEE ARTHROPLASTY  10/13/2008   right   TOTAL SHOULDER ARTHROPLASTY     UMBILICAL HERNIA REPAIR  03/28/2002   [facility]     WRIST SURGERY  2020   Social History   Social History Narrative   Lives with husband, John   Caffeine use: 1 cup coffee every morning   Right handed     Immunization History  Administered Date(s) Administered   Influenza-Unspecified 06/05/2018, 06/21/2021   PFIZER(Purple Top)SARS-COV-2 Vaccination 11/28/2019, 12/18/2019, 06/02/2020, 02/21/2021     Objective: Vital Signs: BP 132/84 (BP Location: Left Arm, Patient Position: Sitting, Cuff Size: Large)   Pulse 76   Resp 16   Ht 5' 1.25" (1.556 m)   Wt 241 lb 3.2 oz (109.4 kg)   BMI 45.20 kg/m    Physical Exam Vitals and nursing note reviewed.  Constitutional:      Appearance: She is well-developed.  HENT:     Head: Normocephalic and atraumatic.  Eyes:     Conjunctiva/sclera: Conjunctivae normal.  Cardiovascular:     Rate and Rhythm: Normal rate  and regular rhythm.     Heart sounds: Normal heart sounds.  Pulmonary:     Effort: Pulmonary effort is normal.     Breath sounds: Normal breath sounds.  Abdominal:     General: Bowel sounds are normal.     Palpations: Abdomen is soft.  Musculoskeletal:     Cervical back: Normal range of motion.  Lymphadenopathy:     Cervical: No cervical adenopathy.  Skin:    General: Skin is warm and dry.     Capillary Refill: Capillary refill takes less than 2 seconds.  Neurological:     Mental Status: She is alert and oriented to person, place, and time.  Psychiatric:        Behavior: Behavior normal.      Musculoskeletal Exam: C-spine, thoracic spine, lumbar spine have good range of motion.  No midline spinal tenderness or SI joint tenderness upon palpation today.  Shoulder joints, elbow joints, wrist joints, MCPs, PIPs, DIPs have good range of motion with no synovitis.  Some tenderness over the PIP joints but no synovitis or dactylitis noted.  Hip joints have good range of motion with no groin pain.  Bilateral knee replacements have good range of motion with no effusion.  Ankle joints have good range of motion with no joint tenderness or synovitis.  No evidence of Achilles tendinitis.  CDAI Exam: CDAI Score: -- Patient Global: --;  Provider Global: -- Swollen: --; Tender: -- Joint Exam 03/06/2023   No joint exam has been documented for this visit   There is currently no information documented on the homunculus. Go to the Rheumatology activity and complete the homunculus joint exam.  Investigation: No additional findings.  Imaging: No results found.  Recent Labs: Lab Results  Component Value Date   WBC 9.0 03/03/2023   HGB 12.0 03/03/2023   PLT 248 03/03/2023   NA 140 03/03/2023   K 3.9 03/03/2023   CL 109 03/03/2023   CO2 23 03/03/2023   GLUCOSE 111 (H) 03/03/2023   BUN 15 03/03/2023   CREATININE 0.94 03/03/2023   BILITOT 0.2 03/03/2023   ALKPHOS 119 06/27/2018   AST 9 (L) 03/03/2023   ALT 9 03/03/2023   PROT 6.4 03/03/2023   ALBUMIN 3.6 06/27/2018   CALCIUM 9.1 03/03/2023   GFRAA 79 01/20/2021   QFTBGOLDPLUS NEGATIVE 12/12/2022    Speciality Comments: Patient recieves Cosentyx through [employer] 04/29/2019  Procedures:  No procedures performed Allergies: Patient has no known allergies.   Assessment / Plan:     Visit Diagnoses: Spondyloarthropathy - MRI positive for sacroiliitis, HLA-B27 negative, elevated ESR: She has not had any signs or symptoms of a flare.  No synovitis or dactylitis noted today.  No midline spinal tenderness or SI joint tenderness at this time.  No evidence of Achilles tendinitis or plantar fasciitis.  Her morning stiffness has only been lasting a few minutes daily.  She has not been experiencing any increased nocturnal pain.  She has been working on weight loss which has been improving her arthralgias, joint stiffness, and energy level.  No eye pain or inflammation.  Patient is prescribed Taltz 80 mg subcutaneous injections every 28 days.  Patient had a mixup with dosing and administered Taltz on 02/02/2023 followed by a repeat injection on 02/09/2023.  She has not noticed any side effects and did not have any injection site reactions.  She  returned to updated lab work currently on 03/03/2023 which was stable.  Patient plans on resuming Altamease Oiler  80 mg sq injections every 28 days as prescribed.  She was advised to notify us if she develops signs or symptoms of a flare.  High risk medication use - Taltz 80 mg sq injections every 28 days.  CBC and CMP updated on 03/03/23.  Her next lab work will be due in September and every 3 months.    TB gold negative on 12/12/22.   No recurrent infections. Discussed the importance of holding taltz if she develops signs or symptoms of an infection and to resume once the infection has completely cleared.   Sacroiliitis [facility]): No SI joint tenderness upon palpation today.  DDD (degenerative disc disease), cervical: Good range of motion of the C-spine with no discomfort at this time.  Trapezius muscle spasm: No muscle spasms currently.  Iliotibial band syndrome of right side: Not currently symptomatic.  Trochanteric bursitis, left hip: Not currently symptomatic.  H/O total knee replacement, bilateral: Doing well.  She has good range of motion of both knee replacements on examination today.  No knee joint effusion noted.  She has been experiencing less discomfort and joint stiffness in her knee joints since working on weight loss.  Other medical conditions are listed as follows:  Lung nodule seen on imaging study, CTA of chest, 6 mm nodule, followed by Dr. Timothy Lasso  History of hypertension: BP was 132/84 today in the office.   Chronic diastolic heart failure (HCC)  History of sleep apnea  History of hyperlipidemia  Medial epicondylitis of both elbows  History of diabetes mellitus  History of TIA (transient ischemic attack)  Orders: No orders of the defined types were placed in this encounter.  No orders of the defined types were placed in this encounter.   Follow-Up Instructions: Return in about 5 months (around 08/06/2023) for Spondyloarthropathy.   Gearldine Bienenstock, PA-C  Note - This  record has been created using Dragon software.  Chart creation errors have been sought, but may not always  have been located. Such creation errors do not reflect on  the standard of medical care.

## 2023-03-03 ENCOUNTER — Other Ambulatory Visit: Payer: Self-pay | Admitting: *Deleted

## 2023-03-03 DIAGNOSIS — Z79899 Other long term (current) drug therapy: Secondary | ICD-10-CM | POA: Diagnosis not present

## 2023-03-04 LAB — CBC WITH DIFFERENTIAL/PLATELET
Absolute Monocytes: 657 cells/uL (ref 200–950)
Basophils Absolute: 36 cells/uL (ref 0–200)
Basophils Relative: 0.4 %
Eosinophils Absolute: 567 cells/uL — ABNORMAL HIGH (ref 15–500)
Eosinophils Relative: 6.3 %
HCT: 37.8 % (ref 35.0–45.0)
Hemoglobin: 12 g/dL (ref 11.7–15.5)
Lymphs Abs: 1683 cells/uL (ref 850–3900)
MCH: 27 pg (ref 27.0–33.0)
MCHC: 31.7 g/dL — ABNORMAL LOW (ref 32.0–36.0)
MCV: 84.9 fL (ref 80.0–100.0)
MPV: 9.4 fL (ref 7.5–12.5)
Monocytes Relative: 7.3 %
Neutro Abs: 6057 cells/uL (ref 1500–7800)
Neutrophils Relative %: 67.3 %
Platelets: 248 10*3/uL (ref 140–400)
RBC: 4.45 10*6/uL (ref 3.80–5.10)
RDW: 14.5 % (ref 11.0–15.0)
Total Lymphocyte: 18.7 %
WBC: 9 10*3/uL (ref 3.8–10.8)

## 2023-03-04 LAB — COMPLETE METABOLIC PANEL WITH GFR
AG Ratio: 1.5 (calc) (ref 1.0–2.5)
ALT: 9 U/L (ref 6–29)
AST: 9 U/L — ABNORMAL LOW (ref 10–35)
Albumin: 3.8 g/dL (ref 3.6–5.1)
Alkaline phosphatase (APISO): 80 U/L (ref 37–153)
BUN: 15 mg/dL (ref 7–25)
CO2: 23 mmol/L (ref 20–32)
Calcium: 9.1 mg/dL (ref 8.6–10.4)
Chloride: 109 mmol/L (ref 98–110)
Creat: 0.94 mg/dL (ref 0.50–1.05)
Globulin: 2.6 g/dL (calc) (ref 1.9–3.7)
Glucose, Bld: 111 mg/dL — ABNORMAL HIGH (ref 65–99)
Potassium: 3.9 mmol/L (ref 3.5–5.3)
Sodium: 140 mmol/L (ref 135–146)
Total Bilirubin: 0.2 mg/dL (ref 0.2–1.2)
Total Protein: 6.4 g/dL (ref 6.1–8.1)
eGFR: 69 mL/min/{1.73_m2} (ref >=60)

## 2023-03-06 ENCOUNTER — Ambulatory Visit: Payer: Medicare HMO | Attending: Physician Assistant | Admitting: Physician Assistant

## 2023-03-06 ENCOUNTER — Encounter: Payer: Self-pay | Admitting: Physician Assistant

## 2023-03-06 ENCOUNTER — Other Ambulatory Visit: Payer: Self-pay

## 2023-03-06 VITALS — BP 132/84 | HR 76 | Resp 16 | Ht 61.25 in | Wt 241.2 lb

## 2023-03-06 DIAGNOSIS — M7631 Iliotibial band syndrome, right leg: Secondary | ICD-10-CM | POA: Diagnosis not present

## 2023-03-06 DIAGNOSIS — R911 Solitary pulmonary nodule: Secondary | ICD-10-CM

## 2023-03-06 DIAGNOSIS — I5032 Chronic diastolic (congestive) heart failure: Secondary | ICD-10-CM

## 2023-03-06 DIAGNOSIS — Z96653 Presence of artificial knee joint, bilateral: Secondary | ICD-10-CM

## 2023-03-06 DIAGNOSIS — M7062 Trochanteric bursitis, left hip: Secondary | ICD-10-CM | POA: Diagnosis not present

## 2023-03-06 DIAGNOSIS — M461 Sacroiliitis, not elsewhere classified: Secondary | ICD-10-CM

## 2023-03-06 DIAGNOSIS — M7702 Medial epicondylitis, left elbow: Secondary | ICD-10-CM

## 2023-03-06 DIAGNOSIS — M47819 Spondylosis without myelopathy or radiculopathy, site unspecified: Secondary | ICD-10-CM

## 2023-03-06 DIAGNOSIS — M5030 Other cervical disc degeneration, unspecified cervical region: Secondary | ICD-10-CM

## 2023-03-06 DIAGNOSIS — Z79899 Other long term (current) drug therapy: Secondary | ICD-10-CM | POA: Diagnosis not present

## 2023-03-06 DIAGNOSIS — Z8679 Personal history of other diseases of the circulatory system: Secondary | ICD-10-CM

## 2023-03-06 DIAGNOSIS — M7701 Medial epicondylitis, right elbow: Secondary | ICD-10-CM

## 2023-03-06 DIAGNOSIS — M62838 Other muscle spasm: Secondary | ICD-10-CM | POA: Diagnosis not present

## 2023-03-06 DIAGNOSIS — Z8669 Personal history of other diseases of the nervous system and sense organs: Secondary | ICD-10-CM

## 2023-03-06 DIAGNOSIS — Z8639 Personal history of other endocrine, nutritional and metabolic disease: Secondary | ICD-10-CM

## 2023-03-06 DIAGNOSIS — Z8673 Personal history of transient ischemic attack (TIA), and cerebral infarction without residual deficits: Secondary | ICD-10-CM

## 2023-03-06 MED ORDER — TALTZ 80 MG/ML ~~LOC~~ SOAJ: SUBCUTANEOUS | 0 refills | Status: DC

## 2023-03-06 NOTE — Patient Instructions (Signed)
Standing Labs We placed an order today for your standing lab work.   Please have your standing labs drawn in September and every 3 months   Please have your labs drawn 2 weeks prior to your appointment so that the provider can discuss your lab results at your appointment, if possible.  Please note that you may see your imaging and lab results in MyChart before we have reviewed them. We will contact you once all results are reviewed. Please allow our office up to 72 hours to thoroughly review all of the results before contacting the office for clarification of your results.  WALK-IN LAB HOURS  Monday through Thursday from 8:00 am -12:30 pm and 1:00 pm-5:00 pm and Friday from 8:00 am-12:00 pm.  Patients with office visits requiring labs will be seen before walk-in labs.  You may encounter longer than normal wait times. Please allow additional time. Wait times may be shorter on  Monday and Thursday afternoons.  We do not book appointments for walk-in labs. We appreciate your patience and understanding with our staff.   Labs are drawn by [facility]. Please bring your co-pay at the time of your lab draw.  You may receive a bill from [facility] for your lab work.  Please note if you are on Hydroxychloroquine and and an order has been placed for a Hydroxychloroquine level,  you will need to have it drawn 4 hours or more after your last dose.  If you wish to have your labs drawn at another location, please call the office 24 hours in advance so we can fax the orders.  The office is located at [address], [address], [place], [place] [address]   If you have any questions regarding directions or hours of operation,  please call [phone].   As a reminder, please drink plenty of water prior to coming for your lab work. Thanks!  

## 2023-03-06 NOTE — Telephone Encounter (Signed)
Patient called and left a voicemail requesting a refill of taltz. Patient states you asked about refill at visit and she was unsure but patient has 1 pen for June and is due for a refill in July.   Please review and sign prescription. Thanks!

## 2023-03-06 NOTE — Progress Notes (Signed)
CBC and CMP stable. We will continue to monitor.

## 2023-04-04 ENCOUNTER — Other Ambulatory Visit: Payer: Self-pay | Admitting: Cardiovascular Disease

## 2023-04-07 DIAGNOSIS — J4520 Mild intermittent asthma, uncomplicated: Secondary | ICD-10-CM | POA: Diagnosis not present

## 2023-04-07 DIAGNOSIS — J310 Chronic rhinitis: Secondary | ICD-10-CM | POA: Diagnosis not present

## 2023-04-07 DIAGNOSIS — R062 Wheezing: Secondary | ICD-10-CM | POA: Diagnosis not present

## 2023-04-07 DIAGNOSIS — J383 Other diseases of vocal cords: Secondary | ICD-10-CM | POA: Diagnosis not present

## 2023-04-14 ENCOUNTER — Other Ambulatory Visit: Payer: Self-pay | Admitting: Internal Medicine

## 2023-04-14 DIAGNOSIS — Z0000 Encounter for general adult medical examination without abnormal findings: Secondary | ICD-10-CM

## 2023-05-10 DIAGNOSIS — G43709 Chronic migraine without aura, not intractable, without status migrainosus: Secondary | ICD-10-CM | POA: Diagnosis not present

## 2023-05-29 ENCOUNTER — Ambulatory Visit
Admission: RE | Admit: 2023-05-29 | Discharge: 2023-05-29 | Disposition: A | Discharge status: Home / Self Care | Payer: Medicare HMO | Source: Ambulatory Visit | Attending: Internal Medicine | Admitting: Internal Medicine

## 2023-05-29 DIAGNOSIS — Z0000 Encounter for general adult medical examination without abnormal findings: Secondary | ICD-10-CM

## 2023-05-29 DIAGNOSIS — Z1231 Encounter for screening mammogram for malignant neoplasm of breast: Secondary | ICD-10-CM | POA: Diagnosis not present

## 2023-05-31 ENCOUNTER — Other Ambulatory Visit: Payer: Self-pay | Admitting: Internal Medicine

## 2023-05-31 DIAGNOSIS — R928 Other abnormal and inconclusive findings on diagnostic imaging of breast: Secondary | ICD-10-CM

## 2023-06-02 ENCOUNTER — Other Ambulatory Visit: Payer: Self-pay | Admitting: *Deleted

## 2023-06-02 DIAGNOSIS — Z79899 Other long term (current) drug therapy: Secondary | ICD-10-CM

## 2023-06-03 LAB — CBC WITH DIFFERENTIAL/PLATELET
Absolute Monocytes: 533 cells/uL (ref 200–950)
Basophils Absolute: 23 cells/uL (ref 0–200)
Basophils Relative: 0.3 %
Eosinophils Absolute: 278 cells/uL (ref 15–500)
Eosinophils Relative: 3.7 %
HCT: 38.9 % (ref 35.0–45.0)
Hemoglobin: 12.1 g/dL (ref 11.7–15.5)
Lymphs Abs: 1995 cells/uL (ref 850–3900)
MCH: 27.1 pg (ref 27.0–33.0)
MCHC: 31.1 g/dL — ABNORMAL LOW (ref 32.0–36.0)
MCV: 87.2 fL (ref 80.0–100.0)
MPV: 9.6 fL (ref 7.5–12.5)
Monocytes Relative: 7.1 %
Neutro Abs: 4673 cells/uL (ref 1500–7800)
Neutrophils Relative %: 62.3 %
Platelets: 255 10*3/uL (ref 140–400)
RBC: 4.46 10*6/uL (ref 3.80–5.10)
RDW: 14 % (ref 11.0–15.0)
Total Lymphocyte: 26.6 %
WBC: 7.5 10*3/uL (ref 3.8–10.8)

## 2023-06-03 LAB — COMPLETE METABOLIC PANEL WITH GFR
AG Ratio: 1.5 (calc) (ref 1.0–2.5)
ALT: 12 U/L (ref 6–29)
AST: 12 U/L (ref 10–35)
Albumin: 4.1 g/dL (ref 3.6–5.1)
Alkaline phosphatase (APISO): 93 U/L (ref 37–153)
BUN: 12 mg/dL (ref 7–25)
CO2: 24 mmol/L (ref 20–32)
Calcium: 9.9 mg/dL (ref 8.6–10.4)
Chloride: 108 mmol/L (ref 98–110)
Creat: 0.84 mg/dL (ref 0.50–1.05)
Globulin: 2.8 g/dL (calc) (ref 1.9–3.7)
Glucose, Bld: 75 mg/dL (ref 65–99)
Potassium: 4.3 mmol/L (ref 3.5–5.3)
Sodium: 139 mmol/L (ref 135–146)
Total Bilirubin: 0.3 mg/dL (ref 0.2–1.2)
Total Protein: 6.9 g/dL (ref 6.1–8.1)
eGFR: 79 mL/min/{1.73_m2} (ref >=60)

## 2023-06-05 ENCOUNTER — Ambulatory Visit
Admission: RE | Admit: 2023-06-05 | Discharge: 2023-06-05 | Disposition: A | Discharge status: Home / Self Care | Payer: Medicare HMO | Source: Ambulatory Visit | Attending: Internal Medicine | Admitting: Internal Medicine

## 2023-06-05 ENCOUNTER — Other Ambulatory Visit: Payer: Self-pay | Admitting: Internal Medicine

## 2023-06-05 DIAGNOSIS — R928 Other abnormal and inconclusive findings on diagnostic imaging of breast: Secondary | ICD-10-CM

## 2023-06-05 DIAGNOSIS — I129 Hypertensive chronic kidney disease with stage 1 through stage 4 chronic kidney disease, or unspecified chronic kidney disease: Secondary | ICD-10-CM | POA: Diagnosis not present

## 2023-06-05 DIAGNOSIS — J029 Acute pharyngitis, unspecified: Secondary | ICD-10-CM | POA: Diagnosis not present

## 2023-06-05 DIAGNOSIS — K1120 Sialoadenitis, unspecified: Secondary | ICD-10-CM | POA: Diagnosis not present

## 2023-06-05 DIAGNOSIS — R921 Mammographic calcification found on diagnostic imaging of breast: Secondary | ICD-10-CM | POA: Diagnosis not present

## 2023-06-05 DIAGNOSIS — N182 Chronic kidney disease, stage 2 (mild): Secondary | ICD-10-CM | POA: Diagnosis not present

## 2023-06-05 DIAGNOSIS — I5189 Other ill-defined heart diseases: Secondary | ICD-10-CM | POA: Diagnosis not present

## 2023-06-05 NOTE — Progress Notes (Signed)
CBC and CMP WNL

## 2023-06-13 ENCOUNTER — Other Ambulatory Visit: Payer: Self-pay | Admitting: Physician Assistant

## 2023-06-13 DIAGNOSIS — M47819 Spondylosis without myelopathy or radiculopathy, site unspecified: Secondary | ICD-10-CM

## 2023-06-13 NOTE — Telephone Encounter (Signed)
Last Fill: 03/06/2023  Labs: 06/02/2023 CBC and CMP WNL   TB Gold: 12/12/2022  Neg    Next Visit: 08/08/2023  Last Visit: 03/06/2023  [id number]   Current Dose per office note 03/06/2023: Taltz 80 mg sq injections every 28 days   Okay to refill Taltz?

## 2023-06-19 ENCOUNTER — Ambulatory Visit
Admission: RE | Admit: 2023-06-19 | Discharge: 2023-06-19 | Disposition: A | Discharge status: Home / Self Care | Payer: Medicare HMO | Source: Ambulatory Visit | Attending: Internal Medicine | Admitting: Internal Medicine

## 2023-06-19 DIAGNOSIS — D242 Benign neoplasm of left breast: Secondary | ICD-10-CM | POA: Diagnosis not present

## 2023-06-19 DIAGNOSIS — R921 Mammographic calcification found on diagnostic imaging of breast: Secondary | ICD-10-CM

## 2023-06-19 HISTORY — PX: BREAST BIOPSY: SHX20

## 2023-06-20 LAB — SURGICAL PATHOLOGY

## 2023-06-22 DIAGNOSIS — I700 Atherosclerosis of aorta: Secondary | ICD-10-CM | POA: Diagnosis not present

## 2023-06-22 DIAGNOSIS — Z23 Encounter for immunization: Secondary | ICD-10-CM | POA: Diagnosis not present

## 2023-06-22 DIAGNOSIS — Z1589 Genetic susceptibility to other disease: Secondary | ICD-10-CM | POA: Diagnosis not present

## 2023-06-22 DIAGNOSIS — D8989 Other specified disorders involving the immune mechanism, not elsewhere classified: Secondary | ICD-10-CM | POA: Diagnosis not present

## 2023-06-22 DIAGNOSIS — F1120 Opioid dependence, uncomplicated: Secondary | ICD-10-CM | POA: Diagnosis not present

## 2023-06-22 DIAGNOSIS — I129 Hypertensive chronic kidney disease with stage 1 through stage 4 chronic kidney disease, or unspecified chronic kidney disease: Secondary | ICD-10-CM | POA: Diagnosis not present

## 2023-06-22 DIAGNOSIS — I5189 Other ill-defined heart diseases: Secondary | ICD-10-CM | POA: Diagnosis not present

## 2023-06-22 DIAGNOSIS — N182 Chronic kidney disease, stage 2 (mild): Secondary | ICD-10-CM | POA: Diagnosis not present

## 2023-06-22 DIAGNOSIS — G894 Chronic pain syndrome: Secondary | ICD-10-CM | POA: Diagnosis not present

## 2023-06-22 DIAGNOSIS — M461 Sacroiliitis, not elsewhere classified: Secondary | ICD-10-CM | POA: Diagnosis not present

## 2023-06-22 DIAGNOSIS — E119 Type 2 diabetes mellitus without complications: Secondary | ICD-10-CM | POA: Diagnosis not present

## 2023-06-22 DIAGNOSIS — N63 Unspecified lump in unspecified breast: Secondary | ICD-10-CM | POA: Diagnosis not present

## 2023-06-23 DIAGNOSIS — J45998 Other asthma: Secondary | ICD-10-CM | POA: Diagnosis not present

## 2023-07-25 NOTE — Progress Notes (Unsigned)
Office Visit Note  Patient: [name]             Date of Birth: 02-21-1961           MRN: [id number]             PCP: Bonnie Corn, MD Referring: Bonnie Corn, MD Visit Date: 08/08/2023 Occupation: @GUAROCC @  Subjective:  Medication monitoring   History of Present Illness: [name] is a 62 y.o. female with history of spondyloarthropathy and DDD.  Patient remains on Taltz 80 mg sq injections every 28 days. She continues to tolerate taltz without any side effects or injection site reactions.  She denies any gaps in therapy.  She denies any recent or recurrent infections.  She denies any signs or symptoms of a flare recently. She has occasional stiffness and discomfort in her lower back especially with positional changes.  She is not currently experiencing any SI joint pain.  She has not been experiencing any nocturnal pain.  Her lower back pain has improved significantly since losing weight.  She denies any joint swelling at this time.  She denies any Achilles tendinitis or plantar fasciitis.  Overall her arthralgias have been more manageable.     Activities of Daily Living:  Patient reports morning stiffness for 0 minutes.   Patient Denies nocturnal pain.  Difficulty dressing/grooming: Denies Difficulty climbing stairs: Denies Difficulty getting out of chair: Denies Difficulty using hands for taps, buttons, cutlery, and/or writing: Denies  Review of Systems  Constitutional:  Negative for fatigue.  HENT:  Negative for mouth sores and mouth dryness.   Eyes:  Negative for pain, visual disturbance and dryness.  Respiratory:  Negative for shortness of breath.   Cardiovascular:  Negative for chest pain and palpitations.  Gastrointestinal:  Positive for constipation. Negative for blood in stool and diarrhea.  Endocrine: Negative for increased urination.  Genitourinary:  Negative for involuntary urination.  Musculoskeletal:  Positive for joint pain and joint  pain. Negative for gait problem, joint swelling, myalgias, muscle weakness, morning stiffness, muscle tenderness and myalgias.  Skin:  Negative for color change, rash and sensitivity to sunlight.  Allergic/Immunologic: Negative for susceptible to infections.  Neurological:  Negative for dizziness and headaches.  Hematological:  Negative for swollen glands.  Psychiatric/Behavioral:  Negative for depressed mood and sleep disturbance. The patient is not nervous/anxious.     PMFS History:  Patient Active Problem List   Diagnosis Date Noted   Tommi Rumps Quervain's disease (radial styloid tenosynovitis) 11/27/2018   Vertigo 10/18/2018   Common migraine with intractable migraine 07/06/2018   Type 2 diabetes mellitus with obesity (HCC) 06/29/2018   Spondyloarthropathy 07/26/2016   High risk medication use 07/26/2016   Chronic low back pain 07/26/2016   H/O total knee replacement, bilateral 07/26/2016   Chronic diastolic heart failure (HCC) 11/29/2015   Dyspnea 06/03/2015   Lung nodule seen on imaging study, CTA of chest, 6 mm nodule, followed by Dr. Timothy Lasso 03/25/2013   Family history of coronary artery disease 03/12/2013   Super obese 03/12/2013   Chest pain with low risk of acute coronary syndrome - most likely musculoskeletal, negative nuc study 03/11/2013   Full thickness rotator cuff tear 05/15/2012   Hypercholesterolemia    DJD (degenerative joint disease), cervical    OSA (obstructive sleep apnea)    Essential hypertension    History of TIA (transient ischemic attack) 9/13    Incomplete tear of rotator cuff    Shoulder impingement 04/12/2012    Past  Medical History:  Diagnosis Date   Anemia    takes iron supplement   Anxiety    Asthma    daily and prn inhalers   Bone spur    Left foot   Chest pain    a. 10/2015: NST showing possible mild ischemia but most consistent with breast attenuation. Low-risk study.    Chronic diastolic CHF (congestive heart failure) (HCC)    Common migraine  with intractable migraine 07/06/2018   DDD (degenerative disc disease), cervical    Degenerative disc disease    Depression    Diabetes mellitus (HCC)    Essential hypertension    Family history of early CAD    Former tobacco use    GERD (gastroesophageal reflux disease)    GERD (gastroesophageal reflux disease)    Headache(784.0)    migraine-like   History of TIA (transient ischemic attack) early 2013   no weakness or deficits   History of UTI    Hyperlipidemia    Insomnia    Migraines    Morbid obesity (HCC)    Partial tear of rotator cuff(726.13)    Psoriatic arthritis (HCC)    Shoulder impingement 04/2012   left   Sleep apnea    uses CPAP nightly    Family History  Problem Relation Age of Onset   Diabetes Mother    Hypertension Mother    Kidney disease Father    Hypertension Father    Diabetes Father    Breast cancer Sister    Diabetes Sister    Breast cancer Sister    Cancer Maternal Grandmother    Diabetes Maternal Grandmother    Hypertension Maternal Grandmother    Diabetes Paternal Grandfather    Cancer Paternal Grandfather    Coronary artery disease Sister    Diabetes Sister    Crohn's disease Maternal Aunt    Crohn's disease Cousin    Crohn's disease Cousin    Crohn's disease Paternal Aunt    Past Surgical History:  Procedure Laterality Date   ABDOMINAL HYSTERECTOMY  03/28/2002   complete   ANTERIOR CERVICAL DECOMP/DISCECTOMY FUSION  02/18/2010   C6-7   BREAST BIOPSY Right    BREAST BIOPSY Left 06/19/2023   MM LT BREAST BX W LOC DEV 1ST LESION IMAGE BX SPEC STEREO GUIDE 06/19/2023 [facility]   DILATION AND CURETTAGE OF UTERUS     2000   FOOT SURGERY Right 2015   KNEE ARTHROPLASTY     ROTATOR CUFF REPAIR Left 2013   TOTAL KNEE ARTHROPLASTY  09/07/2009   left   TOTAL KNEE ARTHROPLASTY  10/13/2008   right   TOTAL SHOULDER ARTHROPLASTY     UMBILICAL HERNIA REPAIR  03/28/2002   [facility]     WRIST SURGERY  2020   Social History   Social  History Narrative   Lives with husband, John   Caffeine use: 1 cup coffee every morning   Right handed    Immunization History  Administered Date(s) Administered   Influenza-Unspecified 06/05/2018, 06/21/2021   PFIZER(Purple Top)SARS-COV-2 Vaccination 11/28/2019, 12/18/2019, 06/02/2020, 02/21/2021     Objective: Vital Signs: BP 127/84 (BP Location: Left Arm, Patient Position: Sitting, Cuff Size: Large)   Pulse 82   Resp 14   Ht 5' 1.25" (1.556 m)   Wt 221 lb 3.2 oz (100.3 kg)   BMI 41.45 kg/m    Physical Exam Vitals and nursing note reviewed.  Constitutional:      Appearance: She is well-developed.  HENT:  Head: Normocephalic and atraumatic.  Eyes:     Conjunctiva/sclera: Conjunctivae normal.  Cardiovascular:     Rate and Rhythm: Normal rate and regular rhythm.     Heart sounds: Normal heart sounds.  Pulmonary:     Effort: Pulmonary effort is normal.     Breath sounds: Normal breath sounds.  Abdominal:     General: Bowel sounds are normal.     Palpations: Abdomen is soft.  Musculoskeletal:     Cervical back: Normal range of motion.  Lymphadenopathy:     Cervical: No cervical adenopathy.  Skin:    General: Skin is warm and dry.     Capillary Refill: Capillary refill takes less than 2 seconds.  Neurological:     Mental Status: She is alert and oriented to person, place, and time.  Psychiatric:        Behavior: Behavior normal.      Musculoskeletal Exam: C-spine, thoracic spine, lumbar spine have good range of motion.  No midline spinal tenderness.  No SI joint tenderness.  Shoulder joints, elbow joints, wrist joints, MCPs, PIPs, DIPs have good range of motion with no synovitis.  Complete fist formation bilaterally.  Hip joints have good range of motion with no groin pain.  Bilateral knee replacements have good range of motion no warmth or effusion.  Ankle joints have good range of motion with no tenderness or joint swelling.  No evidence of Achilles  tendinitis.  CDAI Exam: CDAI Score: -- Patient Global: --; Provider Global: -- Swollen: --; Tender: -- Joint Exam 08/08/2023   No joint exam has been documented for this visit   There is currently no information documented on the homunculus. Go to the Rheumatology activity and complete the homunculus joint exam.  Investigation: No additional findings.  Imaging: No results found.  Recent Labs: Lab Results  Component Value Date   WBC 7.5 06/02/2023   HGB 12.1 06/02/2023   PLT 255 06/02/2023   NA 139 06/02/2023   K 4.3 06/02/2023   CL 108 06/02/2023   CO2 24 06/02/2023   GLUCOSE 75 06/02/2023   BUN 12 06/02/2023   CREATININE 0.84 06/02/2023   BILITOT 0.3 06/02/2023   ALKPHOS 119 06/27/2018   AST 12 06/02/2023   ALT 12 06/02/2023   PROT 6.9 06/02/2023   ALBUMIN 3.6 06/27/2018   CALCIUM 9.9 06/02/2023   GFRAA 79 01/20/2021   QFTBGOLDPLUS NEGATIVE 12/12/2022    Speciality Comments: Patient recieves Cosentyx through [employer] 04/29/2019  Procedures:  No procedures performed Allergies: Patient has no known allergies.   Assessment / Plan:     Visit Diagnoses: Spondyloarthropathy - MRI positive for sacroiliitis, HLA-B27 negative, elevated ESR: She has no synovitis or dactylitis on examination today.  She has not had any signs or symptoms of a flare.  She has not been experiencing any morning stiffness or nocturnal pain.  No SI joint tenderness upon palpation today.  No midline spinal tenderness noted.  She has occasional discomfort in her lower back with positional changes but overall her symptoms have been more manageable since losing weight.  She remains on Taltz 80 mg sq injections every 28 days.  She continues to tolerate Taltz without any side effects or injection site reactions.  She has not had any recent gaps in therapy.  She will remain on Taltz as monotherapy.  She was advised to notify us if she develops signs or symptoms of a flare.   She will follow-up in the office in 5  months or sooner if needed.  High risk medication use - Taltz 80 mg sq injections every 28 days.  CBC and CMP updated on 06/02/23.  Her next lab work is due in December and every 3 months to monitor for toxicity.  Standing orders for CBC and CMP were placed today. TB gold negative on 12/12/22.  No recent or recurrent infections. Discussed the importance of holding taltz if she develops signs or symptoms of an infection and to resume once the infection has completely cleared.  Patient has received the annual flu shot and is planning to receive the pneumonia vaccine at her [facility] office today.  - Plan: CBC with Differential/Platelet, COMPLETE METABOLIC PANEL WITH GFR  Sacroiliitis (HCC): No SI joint tenderness upon palpation today.  She has not been experiencing any nocturnal pain or morning stiffness.   DDD (degenerative disc disease), cervical: C-spine has good range of motion with no discomfort at this time.  Trapezius muscle spasm: Not currently experiencing muscle spasms.  Iliotibial band syndrome of right side: Not currently symptomatic.  Trochanteric bursitis, left hip: Not currently symptomatic.  H/O total knee replacement, bilateral: Doing well.  Good range of motion with no warmth or effusion.  Other medical conditions are listed as follows:  Lung nodule seen on imaging study, CTA of chest, 6 mm nodule, followed by Dr. Timothy Lasso  History of hypertension: Blood pressure was 127/84 today in the office.  Chronic diastolic heart failure (HCC)  History of sleep apnea  History of hyperlipidemia  Medial epicondylitis of both elbows  History of diabetes mellitus  History of TIA (transient ischemic attack)  Orders: Orders Placed This Encounter  Procedures   CBC with Differential/Platelet   COMPLETE METABOLIC PANEL WITH GFR   No orders of the defined types were placed in this encounter.   Follow-Up Instructions: Return in about 5 months  (around 01/06/2024) for Spondyloarthropathy, DDD.   Gearldine Bienenstock, PA-C  Note - This record has been created using Dragon software.  Chart creation errors have been sought, but may not always  have been located. Such creation errors do not reflect on  the standard of medical care.

## 2023-07-27 ENCOUNTER — Telehealth: Payer: Self-pay | Admitting: Pharmacist

## 2023-07-27 NOTE — Telephone Encounter (Signed)
Patient requested Taltz PAP application for [employer] be mailed to her home.  She confirmed address for mailing application  MD portion will be completed when patient returns her portion  Chesley Mires, PharmD, MPH, BCPS, CPP Clinical Pharmacist (Rheumatology and Pulmonology)

## 2023-08-08 ENCOUNTER — Ambulatory Visit: Payer: Medicare HMO | Attending: Physician Assistant | Admitting: Physician Assistant

## 2023-08-08 ENCOUNTER — Encounter: Payer: Self-pay | Admitting: Physician Assistant

## 2023-08-08 VITALS — BP 127/84 | HR 82 | Resp 14 | Ht 61.25 in | Wt 221.2 lb

## 2023-08-08 DIAGNOSIS — M7701 Medial epicondylitis, right elbow: Secondary | ICD-10-CM

## 2023-08-08 DIAGNOSIS — R911 Solitary pulmonary nodule: Secondary | ICD-10-CM

## 2023-08-08 DIAGNOSIS — M7062 Trochanteric bursitis, left hip: Secondary | ICD-10-CM

## 2023-08-08 DIAGNOSIS — M7702 Medial epicondylitis, left elbow: Secondary | ICD-10-CM

## 2023-08-08 DIAGNOSIS — Z8679 Personal history of other diseases of the circulatory system: Secondary | ICD-10-CM | POA: Diagnosis not present

## 2023-08-08 DIAGNOSIS — Z79899 Other long term (current) drug therapy: Secondary | ICD-10-CM

## 2023-08-08 DIAGNOSIS — M7631 Iliotibial band syndrome, right leg: Secondary | ICD-10-CM

## 2023-08-08 DIAGNOSIS — Z96653 Presence of artificial knee joint, bilateral: Secondary | ICD-10-CM | POA: Diagnosis not present

## 2023-08-08 DIAGNOSIS — M5030 Other cervical disc degeneration, unspecified cervical region: Secondary | ICD-10-CM

## 2023-08-08 DIAGNOSIS — Z8639 Personal history of other endocrine, nutritional and metabolic disease: Secondary | ICD-10-CM

## 2023-08-08 DIAGNOSIS — M62838 Other muscle spasm: Secondary | ICD-10-CM | POA: Diagnosis not present

## 2023-08-08 DIAGNOSIS — Z8669 Personal history of other diseases of the nervous system and sense organs: Secondary | ICD-10-CM

## 2023-08-08 DIAGNOSIS — M47819 Spondylosis without myelopathy or radiculopathy, site unspecified: Secondary | ICD-10-CM | POA: Diagnosis not present

## 2023-08-08 DIAGNOSIS — Z8673 Personal history of transient ischemic attack (TIA), and cerebral infarction without residual deficits: Secondary | ICD-10-CM

## 2023-08-08 DIAGNOSIS — I5032 Chronic diastolic (congestive) heart failure: Secondary | ICD-10-CM | POA: Diagnosis not present

## 2023-08-08 DIAGNOSIS — Z23 Encounter for immunization: Secondary | ICD-10-CM | POA: Diagnosis not present

## 2023-08-08 DIAGNOSIS — M461 Sacroiliitis, not elsewhere classified: Secondary | ICD-10-CM

## 2023-08-08 NOTE — Telephone Encounter (Signed)
Received notification from [facility] regarding a prior authorization for TALTZ. Authorization has been APPROVED from 08/08/23 to 09/11/24. Approval letter sent to scan center.  Submitted Patient Assistance RENEWAL Application to [facility] for TALTZ along with provider portion, patient portion, PA, medication list, insurance card copy and income documents. Will update patient when we receive a response.  Phone: [phone] Fax: [phone]  Chesley Mires, PharmD, MPH, BCPS, CPP Clinical Pharmacist (Rheumatology and Pulmonology)

## 2023-08-08 NOTE — Telephone Encounter (Signed)
Patient returned her portion of renewal application at [facility] today with income documents. Provider portion completed by Sherron Ales, PA-C, today. Scanned to Onbase with patient portion, income documents, med list, insurance card cyop  Submitted a Prior Authorization request to [facility] for TALTZ via CoverMyMeds. Pending clinical questions to populate  Key: [id number]   [name], PharmD, MPH, BCPS, CPP Clinical Pharmacist (Rheumatology and Pulmonology)

## 2023-08-08 NOTE — Telephone Encounter (Signed)
Clinical questions for Bonnie Oiler PA completed

## 2023-08-08 NOTE — Patient Instructions (Signed)
Standing Labs We placed an order today for your standing lab work.   Please have your standing labs drawn in December and every 3 months   Please have your labs drawn 2 weeks prior to your appointment so that the provider can discuss your lab results at your appointment, if possible.  Please note that you may see your imaging and lab results in MyChart before we have reviewed them. We will contact you once all results are reviewed. Please allow our office up to 72 hours to thoroughly review all of the results before contacting the office for clarification of your results.  WALK-IN LAB HOURS  Monday through Thursday from 8:00 am -12:30 pm and 1:00 pm-5:00 pm and Friday from 8:00 am-12:00 pm.  Patients with office visits requiring labs will be seen before walk-in labs.  You may encounter longer than normal wait times. Please allow additional time. Wait times may be shorter on  Monday and Thursday afternoons.  We do not book appointments for walk-in labs. We appreciate your patience and understanding with our staff.   Labs are drawn by [facility]. Please bring your co-pay at the time of your lab draw.  You may receive a bill from [facility] for your lab work.  Please note if you are on Hydroxychloroquine and and an order has been placed for a Hydroxychloroquine level,  you will need to have it drawn 4 hours or more after your last dose.  If you wish to have your labs drawn at another location, please call the office 24 hours in advance so we can fax the orders.  The office is located at [address], [address], [place], [place] [address]   If you have any questions regarding directions or hours of operation,  please call [phone].   As a reminder, please drink plenty of water prior to coming for your lab work. Thanks!

## 2023-08-14 NOTE — Telephone Encounter (Signed)
Received a fax from  [facility] regarding an approval for TALTZ patient assistance from 09/13/2023 to 09/11/2024. Approval letter sent to scan center.  Phone: [phone] Fax: [phone]  Chesley Mires, PharmD, MPH, BCPS, CPP Clinical Pharmacist (Rheumatology and Pulmonology)

## 2023-08-16 ENCOUNTER — Encounter: Payer: Self-pay | Admitting: Cardiovascular Disease

## 2023-08-16 ENCOUNTER — Ambulatory Visit: Payer: Medicare HMO | Attending: Cardiovascular Disease | Admitting: Cardiovascular Disease

## 2023-08-16 VITALS — BP 139/73 | HR 67 | Ht 61.25 in | Wt 221.6 lb

## 2023-08-16 DIAGNOSIS — E7800 Pure hypercholesterolemia, unspecified: Secondary | ICD-10-CM | POA: Diagnosis not present

## 2023-08-16 DIAGNOSIS — G4733 Obstructive sleep apnea (adult) (pediatric): Secondary | ICD-10-CM | POA: Diagnosis not present

## 2023-08-16 DIAGNOSIS — E1169 Type 2 diabetes mellitus with other specified complication: Secondary | ICD-10-CM | POA: Diagnosis not present

## 2023-08-16 DIAGNOSIS — I5032 Chronic diastolic (congestive) heart failure: Secondary | ICD-10-CM

## 2023-08-16 DIAGNOSIS — E669 Obesity, unspecified: Secondary | ICD-10-CM

## 2023-08-16 DIAGNOSIS — I10 Essential (primary) hypertension: Secondary | ICD-10-CM | POA: Diagnosis not present

## 2023-08-16 NOTE — Patient Instructions (Signed)
Medication Instructions:  No changes *If you need a refill on your cardiac medications before your next appointment, please call your pharmacy*  Follow-Up: At [facility], you and your health needs are our priority.  As part of our continuing mission to provide you with exceptional heart care, we have created designated Provider Care Teams.  These Care Teams include your primary Cardiologist (physician) and Advanced Practice Providers (APPs -  Physician Assistants and Nurse Practitioners) who all work together to provide you with the care you need, when you need it.  We recommend signing up for the patient portal called "MyChart".  Sign up information is provided on this After Visit Summary.  MyChart is used to connect with patients for Virtual Visits (Telemedicine).  Patients are able to view lab/test results, encounter notes, upcoming appointments, etc.  Non-urgent messages can be sent to your provider as well.   To learn more about what you can do with MyChart, go to [email or web address].    Your next appointment:   Sleep Clinic Appt with Dr Tresa Endo- first available  Dr Royann Shivers- 1 year

## 2023-08-16 NOTE — Progress Notes (Addendum)
Patient ID: Aryn Mole, female   DOB: 1961-03-19, 62 y.o.   MRN: [id number]     Cardiology Office Note    Date:  08/16/2023   ID:  [name], DOB [birth year], MRN [id number]  PCP:  Creola Corn, MD  Cardiologist:   Thurmon Fair, MD   Chief Complaint  Patient presents with   Congestive Heart Failure    History of Present Illness:  [name] is a 62 y.o. female with history of chronic diastolic CHF, morbid obesity, OSA, TIA, GERD, HTN, HLD, DM, asthma, anemia, former tobacco abuse, ankylosing spondylitis who presents for f/u.  She feels great.  She has no complaints of shortness of breath at rest or with usual activity and has not had any edema.  She is not taking any diuretics other than the SGLT2 inhibitor Marcelline Deist (this has recently been reduced to only 2 days a week).  She has made remarkable progress with weight loss.  She is taking Ozempic and has lost about 60 pounds.  She remains in morbidly obese range with a BMI of 41, but there has been substantial improvement in her lipid parameters and she has essentially "cured" her diabetes mellitus with a recent hemoglobin A1c of 5.2%.  Her blood pressure is typically in the 120s/70s, although it was a little higher today when she first checked in.  She remains compliant with CPAP and denies any daytime hypersomnolence.  She does wonder whether her CPAP settings should be adjusted after losing so much weight.   Past Medical History:  Diagnosis Date   Anemia    takes iron supplement   Anxiety    Asthma    daily and prn inhalers   Bone spur    Left foot   Chest pain    a. 10/2015: NST showing possible mild ischemia but most consistent with breast attenuation. Low-risk study.    Chronic diastolic CHF (congestive heart failure) (HCC)    Common migraine with intractable migraine 07/06/2018   DDD (degenerative disc disease), cervical    Degenerative disc disease    Depression    Diabetes mellitus  (HCC)    Essential hypertension    Family history of early CAD    Former tobacco use    GERD (gastroesophageal reflux disease)    GERD (gastroesophageal reflux disease)    Headache(784.0)    migraine-like   History of TIA (transient ischemic attack) early 2013   no weakness or deficits   History of UTI    Hyperlipidemia    Insomnia    Migraines    Morbid obesity (HCC)    Partial tear of rotator cuff(726.13)    Psoriatic arthritis (HCC)    Shoulder impingement 04/2012   left   Sleep apnea    uses CPAP nightly    Past Surgical History:  Procedure Laterality Date   ABDOMINAL HYSTERECTOMY  03/28/2002   complete   ANTERIOR CERVICAL DECOMP/DISCECTOMY FUSION  02/18/2010   C6-7   BREAST BIOPSY Right    BREAST BIOPSY Left 06/19/2023   MM LT BREAST BX W LOC DEV 1ST LESION IMAGE BX SPEC STEREO GUIDE 06/19/2023 [facility]   DILATION AND CURETTAGE OF UTERUS     2000   FOOT SURGERY Right 2015   KNEE ARTHROPLASTY     ROTATOR CUFF REPAIR Left 2013   TOTAL KNEE ARTHROPLASTY  09/07/2009   left   TOTAL KNEE ARTHROPLASTY  10/13/2008   right   TOTAL SHOULDER ARTHROPLASTY  UMBILICAL HERNIA REPAIR  03/28/2002   wisidom     WRIST SURGERY  2020    Outpatient Medications Prior to Visit  Medication Sig Dispense Refill   acidophilus (RISAQUAD) CAPS Take 1 capsule by mouth every morning.      aspirin 81 MG tablet Take 81 mg by mouth daily.     atorvastatin (LIPITOR) 20 MG tablet Take 20 mg by mouth every evening.      dapagliflozin propanediol (FARXIGA) 10 MG TABS tablet Take 1 tablet (10 mg total) by mouth daily before breakfast. (Patient taking differently: Take 10 mg by mouth 2 (two) times a week.) 30 tablet 4   diclofenac (CATAFLAM) 50 MG tablet Take 50 mg by mouth 2 (two) times daily.     diclofenac sodium (VOLTAREN) 1 % GEL Apply three grams to three large joints up to three times daily 3 Tube 3   DULoxetine (CYMBALTA) 60 MG capsule Take 60 mg by mouth daily.   3   ferrous sulfate  325 (65 FE) MG tablet Take 325 mg by mouth 2 (two) times a week.     HYDROcodone-acetaminophen (NORCO/VICODIN) 5-325 MG per tablet Take 1 tablet by mouth as needed for moderate pain.      irbesartan (AVAPRO) 75 MG tablet Take 37.5 mg by mouth. Take only if blood pressure above 130     loratadine (CLARITIN) 10 MG tablet Take 10 mg by mouth daily.     metFORMIN (GLUCOPHAGE) 500 MG tablet Take 500 mg by mouth daily with breakfast.     Multiple Vitamin (MULITIVITAMIN WITH MINERALS) TABS Take 1 tablet by mouth daily.     NON FORMULARY at bedtime. CPAP     ONE TOUCH ULTRA TEST test strip      ONETOUCH DELICA LANCETS 33G MISC      pantoprazole (PROTONIX) 40 MG tablet Take 40 mg by mouth daily.     Semaglutide, 1 MG/DOSE, (OZEMPIC, 1 MG/DOSE,) 2 MG/1.5ML SOPN Inject into the skin once a week.     spironolactone (ALDACTONE) 25 MG tablet TAKE 1/2 TABLET BY MOUTH ONCE DAILY 45 tablet 2   TALTZ 80 MG/ML SOAJ INJECT 80MG  (1 PEN) UNDER THE SKIN EVERY 4 WEEKS 3 mL 0   topiramate (TOPAMAX) 200 MG tablet Take 400 mg by mouth at bedtime. Take 2 tablets daily at bedtime     vitamin B-12 (CYANOCOBALAMIN) 500 MCG tablet Take 500 mcg by mouth daily.     albuterol (VENTOLIN HFA) 108 (90 Base) MCG/ACT inhaler 2 puff q 4-6 hours prn wheezes (Patient not taking: Reported on 08/16/2023)     ALPRAZolam (XANAX) 0.5 MG tablet Take 0.5 mg by mouth 2 (two) times daily as needed. (Patient not taking: Reported on 08/16/2023)     amoxicillin (AMOXIL) 500 MG capsule Take 2,000 mg by mouth as needed. Prior to dental work (Patient not taking: Reported on 08/08/2023)     Azelastine HCl 0.15 % SOLN as needed. (Patient not taking: Reported on 08/16/2023)     baclofen (LIORESAL) 10 MG tablet Take 10-20 mg by mouth See admin instructions. Weekly as needed (Patient not taking: Reported on 08/16/2023)     calcium carbonate (TUMS E-X 750) 750 MG chewable tablet as needed. (Patient not taking: Reported on 08/16/2023)     chlorproMAZINE  (THORAZINE) 25 MG tablet Take 25-50 mg by mouth See admin instructions. every 3 hours as needed max twice month (Patient not taking: Reported on 08/16/2023)  1   diclofenac (VOLTAREN) 50 MG EC tablet  Take 50 mg by mouth daily in the afternoon.     furosemide (LASIX) 40 MG tablet TAKE 1 TABLET BY MOUTH DAILY (Patient not taking: Reported on 08/16/2023) 90 tablet 3   hydrOXYzine (ATARAX/VISTARIL) 25 MG tablet Take 25 mg by mouth as directed. (Patient not taking: Reported on 04/07/2022)     methocarbamol (ROBAXIN) 750 MG tablet 2 tabs up to 3xd as needed for muscle spasm (Patient not taking: Reported on 08/16/2023)     mupirocin nasal ointment (BACTROBAN) 2 % Place 1 application  into the nose 2 (two) times daily. Use one-half of tube in each . After application, press sides of nose together and gently massage. As Needed (Patient not taking: Reported on 03/06/2023)     Olopatadine HCl (PATADAY OP) Place 1 drop into both eyes as needed. (Patient not taking: Reported on 08/16/2023)     predniSONE (DELTASONE) 5 MG tablet Take 4 tablets by mouth daily x2 days, 3 tablets daily x2 days, 2 tablets daily x2 days, 1 tablet daily x2 days. (Patient not taking: Reported on 10/05/2022) 20 tablet 0   No facility-administered medications prior to visit.     Allergies:   Patient has no known allergies.   Social History   Socioeconomic History   Marital status: Married    Spouse name: John   Number of children: 5   Years of education: some college   Highest education level: Not on file  Occupational History   Occupation: Disabled  Tobacco Use   Smoking status: Former    Current packs/day: 0.00    Average packs/day: 0.5 packs/day for 20.0 years (10.0 ttl pk-yrs)    Types: Cigarettes    Start date: 10/02/1979    Quit date: 10/02/1999    Years since quitting: 23.8    Passive exposure: Past   Smokeless tobacco: Never  Vaping Use   Vaping status: Never Used  Substance and Sexual Activity   Alcohol use: No   Drug  use: No   Sexual activity: Yes    Birth control/protection: None  Other Topics Concern   Not on file  Social History Narrative   Lives with husband, John   Caffeine use: 1 cup coffee every morning   Right handed    Social Determinants of Health   Financial Resource Strain: Not on file  Food Insecurity: Not on file  Transportation Needs: Not on file  Physical Activity: Not on file  Stress: Not on file  Social Connections: Not on file     Family History:  The patient's family history includes Breast cancer in her sister and sister; Cancer in her maternal grandmother and paternal grandfather; Coronary artery disease in her sister; Crohn's disease in her cousin, cousin, maternal aunt, and paternal aunt; Diabetes in her father, maternal grandmother, mother, paternal grandfather, sister, and sister; Hypertension in her father, maternal grandmother, and mother; Kidney disease in her father.   ROS:   Please see the history of present illness.    ROS All other systems are reviewed and are negative.   PHYSICAL EXAM:   VS:  BP 139/73   Pulse 67   Ht 5' 1.25" (1.556 m)   Wt 221 lb 9.6 oz (100.5 kg)   SpO2 96%   BMI 41.53 kg/m      General: Alert, oriented x3, no distress, morbidly obese Head: no evidence of trauma, PERRL, EOMI, no exophtalmos or lid lag, no myxedema, no xanthelasma; normal ears, nose and oropharynx Neck: normal jugular venous pulsations and  no hepatojugular reflux; brisk carotid pulses without delay and no carotid bruits Chest: clear to auscultation, no signs of consolidation by percussion or palpation, normal fremitus, symmetrical and full respiratory excursions Cardiovascular: normal position and quality of the apical impulse, regular rhythm, normal first and second heart sounds, no murmurs, rubs or gallops Abdomen: no tenderness or distention, no masses by palpation, no abnormal pulsatility or arterial bruits, normal bowel sounds, no hepatosplenomegaly Extremities:  no clubbing, cyanosis or edema; 2+ radial, ulnar and brachial pulses bilaterally; 2+ right femoral, posterior tibial and dorsalis pedis pulses; 2+ left femoral, posterior tibial and dorsalis pedis pulses; no subclavian or femoral bruits Neurological: grossly nonfocal Psych: Normal mood and affect    Wt Readings from Last 3 Encounters:  08/16/23 221 lb 9.6 oz (100.5 kg)  08/08/23 221 lb 3.2 oz (100.3 kg)  03/06/23 241 lb 3.2 oz (109.4 kg)      Studies/Labs Reviewed:   EKG:    EKG Interpretation Date/Time:  Wednesday August 16 2023 10:27:39 EST Ventricular Rate:  67 PR Interval:  126 QRS Duration:  100 QT Interval:  410 QTC Calculation: 433 R Axis:   29  Text Interpretation: Normal sinus rhythm Normal ECG When compared with ECG of 27-Mar-2020 19:29, No significant change was found Confirmed by Callin Ashe ([id number]) on 08/16/2023 10:42:14 AM        Recent Labs: 06/02/2023: ALT 12; BUN 12; Creat 0.84; Hemoglobin 12.1; Platelets 255; Potassium 4.3; Sodium 139  06/22/2023 hemoglobin A1c 5.2%  Lipid Panel    Component Value Date/Time   CHOL 143 03/12/2013 0200   TRIG 128 03/12/2013 0200   HDL 42 03/12/2013 0200   CHOLHDL 3.4 03/12/2013 0200   VLDL 26 03/12/2013 0200   LDLCALC 75 03/12/2013 0200   10/11/2021 Cholesterol 154, HDL 44, LDL 96, triglycerides 72  October 14, 2022. Cholesterol     160                Triglycerides   125               HDL     49                LDL       86                                                         ASSESSMENT:    1. Chronic diastolic heart failure (HCC)   2. Essential hypertension   3. Morbid obesity (HCC)   4. OSA (obstructive sleep apnea)   5. Hypercholesterolemia   6. Type 2 diabetes mellitus with obesity (HCC)      PLAN:  In order of problems listed above:  CHF: NYHA functional class I and euvolemic.  As her diabetes control has improved with weight loss, her dose of dapagliflozin has been decreased.  I pointed  out to her that this medication is also helpful for diastolic heart failure.  If she develops shortness of breath or edema she should go back on dapagliflozin 10 mg once daily. HTN: Mildly elevated on arrival (she has had problems with whitecoat hypertension before), but consistently normal at home under 130/80.  If she has symptomatic hypotension after weight loss, would recommend stopping the spironolactone. Obesity: Excellent job at losing weight on the Ozempic.  She would like to lose down to 200 pounds. OSA: Reports 100% compliance with CPAP and would like to see Dr. Tresa Endo back in the sleep clinic to see if her CPAP settings need to be adjusted now that she is lost all this weight. HLP: She does not have known CAD or PAD and target LDL is less than 100.  The most recent labs that have showed an LDL cholesterol of 96.  Continue statin.  She has labs with Dr. Timothy Lasso in January. DM: Excellent control.  Note that her dose of Marcelline Deist is being decreased.  Need to remember that this medicine is also useful for her heart failure.    Medication Adjustments/Labs and Tests Ordered: Current medicines are reviewed at length with the patient today.  Concerns regarding medicines are outlined above.  Medication changes, Labs and Tests ordered today are listed in the Patient Instructions below. Patient Instructions  Medication Instructions:  No changes *If you need a refill on your cardiac medications before your next appointment, please call your pharmacy*  Follow-Up: At [facility], you and your health needs are our priority.  As part of our continuing mission to provide you with exceptional heart care, we have created designated Provider Care Teams.  These Care Teams include your primary Cardiologist (physician) and Advanced Practice Providers (APPs -  Physician Assistants and Nurse Practitioners) who all work together to provide you with the care you need, when you need it.  We recommend signing  up for the patient portal called "MyChart".  Sign up information is provided on this After Visit Summary.  MyChart is used to connect with patients for Virtual Visits (Telemedicine).  Patients are able to view lab/test results, encounter notes, upcoming appointments, etc.  Non-urgent messages can be sent to your provider as well.   To learn more about what you can do with MyChart, go to [email or web address].    Your next appointment:   Sleep Clinic Appt with Dr Tresa Endo- first available  Dr Royann Shivers- 1 year      Signed, Thurmon Fair, MD  08/16/2023 1:28 PM    [facility] [address] [place], [place], [place]  [address] Phone: [phone]; Fax: [phone]

## 2023-08-17 ENCOUNTER — Other Ambulatory Visit: Payer: Self-pay

## 2023-08-17 DIAGNOSIS — Z79899 Other long term (current) drug therapy: Secondary | ICD-10-CM | POA: Diagnosis not present

## 2023-08-18 LAB — CBC WITH DIFFERENTIAL/PLATELET
Absolute Lymphocytes: 2571 {cells}/uL (ref 850–3900)
Absolute Monocytes: 585 {cells}/uL (ref 200–950)
Basophils Absolute: 43 {cells}/uL (ref 0–200)
Basophils Relative: 0.5 %
Eosinophils Absolute: 267 {cells}/uL (ref 15–500)
Eosinophils Relative: 3.1 %
HCT: 38.2 % (ref 35.0–45.0)
Hemoglobin: 12.2 g/dL (ref 11.7–15.5)
MCH: 27.7 pg (ref 27.0–33.0)
MCHC: 31.9 g/dL — ABNORMAL LOW (ref 32.0–36.0)
MCV: 86.8 fL (ref 80.0–100.0)
MPV: 10.1 fL (ref 7.5–12.5)
Monocytes Relative: 6.8 %
Neutro Abs: 5134 {cells}/uL (ref 1500–7800)
Neutrophils Relative %: 59.7 %
Platelets: 243 10*3/uL (ref 140–400)
RBC: 4.4 10*6/uL (ref 3.80–5.10)
RDW: 13.8 % (ref 11.0–15.0)
Total Lymphocyte: 29.9 %
WBC: 8.6 10*3/uL (ref 3.8–10.8)

## 2023-08-18 LAB — COMPLETE METABOLIC PANEL WITH GFR
AG Ratio: 1.4 (calc) (ref 1.0–2.5)
ALT: 12 U/L (ref 6–29)
AST: 11 U/L (ref 10–35)
Albumin: 4 g/dL (ref 3.6–5.1)
Alkaline phosphatase (APISO): 87 U/L (ref 37–153)
BUN: 20 mg/dL (ref 7–25)
CO2: 24 mmol/L (ref 20–32)
Calcium: 9.6 mg/dL (ref 8.6–10.4)
Chloride: 111 mmol/L — ABNORMAL HIGH (ref 98–110)
Creat: 0.88 mg/dL (ref 0.50–1.05)
Globulin: 2.8 g/dL (ref 1.9–3.7)
Glucose, Bld: 97 mg/dL (ref 65–99)
Potassium: 4.2 mmol/L (ref 3.5–5.3)
Sodium: 142 mmol/L (ref 135–146)
Total Bilirubin: 0.3 mg/dL (ref 0.2–1.2)
Total Protein: 6.8 g/dL (ref 6.1–8.1)
eGFR: 74 mL/min/{1.73_m2} (ref >=60)

## 2023-08-18 NOTE — Progress Notes (Signed)
CMP WNL. CBC WNL.

## 2023-08-21 ENCOUNTER — Other Ambulatory Visit: Payer: Self-pay | Admitting: Physician Assistant

## 2023-08-21 DIAGNOSIS — M47819 Spondylosis without myelopathy or radiculopathy, site unspecified: Secondary | ICD-10-CM

## 2023-08-21 NOTE — Telephone Encounter (Signed)
Last Fill: 06/13/2023  Labs: 08/17/2023 CMP WNL CBC WNL  TB Gold: 12/12/2022 Neg    Next Visit: 01/09/2024  Last Visit: 08/08/2023  [id number]   Current Dose per office note 08/08/2023: Taltz 80 mg sq injections every 28 days   Okay to refill Taltz?

## 2023-08-24 ENCOUNTER — Encounter: Payer: Self-pay | Admitting: Cardiovascular Disease

## 2023-09-15 DIAGNOSIS — G4733 Obstructive sleep apnea (adult) (pediatric): Secondary | ICD-10-CM | POA: Diagnosis not present

## 2023-10-06 DIAGNOSIS — I129 Hypertensive chronic kidney disease with stage 1 through stage 4 chronic kidney disease, or unspecified chronic kidney disease: Secondary | ICD-10-CM | POA: Diagnosis not present

## 2023-10-06 DIAGNOSIS — J029 Acute pharyngitis, unspecified: Secondary | ICD-10-CM | POA: Diagnosis not present

## 2023-10-06 DIAGNOSIS — N182 Chronic kidney disease, stage 2 (mild): Secondary | ICD-10-CM | POA: Diagnosis not present

## 2023-10-06 DIAGNOSIS — J020 Streptococcal pharyngitis: Secondary | ICD-10-CM | POA: Diagnosis not present

## 2023-10-06 DIAGNOSIS — R051 Acute cough: Secondary | ICD-10-CM | POA: Diagnosis not present

## 2023-10-06 DIAGNOSIS — I5189 Other ill-defined heart diseases: Secondary | ICD-10-CM | POA: Diagnosis not present

## 2023-10-20 DIAGNOSIS — D649 Anemia, unspecified: Secondary | ICD-10-CM | POA: Diagnosis not present

## 2023-10-20 DIAGNOSIS — N182 Chronic kidney disease, stage 2 (mild): Secondary | ICD-10-CM | POA: Diagnosis not present

## 2023-10-20 DIAGNOSIS — Z1212 Encounter for screening for malignant neoplasm of rectum: Secondary | ICD-10-CM | POA: Diagnosis not present

## 2023-10-20 DIAGNOSIS — E119 Type 2 diabetes mellitus without complications: Secondary | ICD-10-CM | POA: Diagnosis not present

## 2023-10-20 DIAGNOSIS — E785 Hyperlipidemia, unspecified: Secondary | ICD-10-CM | POA: Diagnosis not present

## 2023-10-20 DIAGNOSIS — I129 Hypertensive chronic kidney disease with stage 1 through stage 4 chronic kidney disease, or unspecified chronic kidney disease: Secondary | ICD-10-CM | POA: Diagnosis not present

## 2023-10-26 DIAGNOSIS — F1120 Opioid dependence, uncomplicated: Secondary | ICD-10-CM | POA: Diagnosis not present

## 2023-10-26 DIAGNOSIS — M461 Sacroiliitis, not elsewhere classified: Secondary | ICD-10-CM | POA: Diagnosis not present

## 2023-10-26 DIAGNOSIS — R82998 Other abnormal findings in urine: Secondary | ICD-10-CM | POA: Diagnosis not present

## 2023-10-26 DIAGNOSIS — G894 Chronic pain syndrome: Secondary | ICD-10-CM | POA: Diagnosis not present

## 2023-10-26 DIAGNOSIS — N182 Chronic kidney disease, stage 2 (mild): Secondary | ICD-10-CM | POA: Diagnosis not present

## 2023-10-26 DIAGNOSIS — I700 Atherosclerosis of aorta: Secondary | ICD-10-CM | POA: Diagnosis not present

## 2023-10-26 DIAGNOSIS — Z1589 Genetic susceptibility to other disease: Secondary | ICD-10-CM | POA: Diagnosis not present

## 2023-10-26 DIAGNOSIS — Z0000 Encounter for general adult medical examination without abnormal findings: Secondary | ICD-10-CM | POA: Diagnosis not present

## 2023-10-26 DIAGNOSIS — D8989 Other specified disorders involving the immune mechanism, not elsewhere classified: Secondary | ICD-10-CM | POA: Diagnosis not present

## 2023-10-26 DIAGNOSIS — I5189 Other ill-defined heart diseases: Secondary | ICD-10-CM | POA: Diagnosis not present

## 2023-10-26 DIAGNOSIS — I129 Hypertensive chronic kidney disease with stage 1 through stage 4 chronic kidney disease, or unspecified chronic kidney disease: Secondary | ICD-10-CM | POA: Diagnosis not present

## 2023-10-26 DIAGNOSIS — E119 Type 2 diabetes mellitus without complications: Secondary | ICD-10-CM | POA: Diagnosis not present

## 2023-11-01 ENCOUNTER — Ambulatory Visit: Payer: Medicare HMO | Admitting: Cardiovascular Disease

## 2023-11-06 ENCOUNTER — Other Ambulatory Visit: Payer: Self-pay | Admitting: Internal Medicine

## 2023-11-06 DIAGNOSIS — R921 Mammographic calcification found on diagnostic imaging of breast: Secondary | ICD-10-CM

## 2023-11-08 ENCOUNTER — Ambulatory Visit: Payer: Medicare HMO | Attending: Cardiovascular Disease | Admitting: Cardiovascular Disease

## 2023-11-08 ENCOUNTER — Encounter: Payer: Self-pay | Admitting: Cardiovascular Disease

## 2023-11-08 VITALS — BP 130/82 | HR 68 | Ht 61.0 in | Wt 221.2 lb

## 2023-11-08 DIAGNOSIS — E785 Hyperlipidemia, unspecified: Secondary | ICD-10-CM

## 2023-11-08 DIAGNOSIS — E1169 Type 2 diabetes mellitus with other specified complication: Secondary | ICD-10-CM | POA: Diagnosis not present

## 2023-11-08 DIAGNOSIS — E7800 Pure hypercholesterolemia, unspecified: Secondary | ICD-10-CM | POA: Diagnosis not present

## 2023-11-08 DIAGNOSIS — I10 Essential (primary) hypertension: Secondary | ICD-10-CM

## 2023-11-08 DIAGNOSIS — G4733 Obstructive sleep apnea (adult) (pediatric): Secondary | ICD-10-CM

## 2023-11-08 DIAGNOSIS — E669 Obesity, unspecified: Secondary | ICD-10-CM

## 2023-11-08 DIAGNOSIS — I5032 Chronic diastolic (congestive) heart failure: Secondary | ICD-10-CM

## 2023-11-08 NOTE — Progress Notes (Unsigned)
 Cardiology Office Note    Date:  11/10/2023   ID:  Marleny, Faller 1961-06-25, MRN [id number]  PCP:  Creola Corn, MD  Cardiologist:  Nicki Guadalajara, MD (sleep); Dr. Jomarie Longs  19 month F/U Sleep evaluation   History of Present Illness:  Anneke Cundy is a 63 y.o. female who is followed by Dr. Royann Shivers for cardiology care.  I last saw her for a sleep evaluation on March 29, 2022.  She presents for a 22-month follow-up evaluation.    Ms. Web has a history of obesity, hypertension, hyperlipidemia, as well as remote TIA.  She was diagnosed with obstructive sleep apnea in June 2012 and overall AHI was 8.9/h.  She underwent CPAP titration study and was titrated up to 13 cm of water pressure with excellent benefit.  In 2015, prior machine malfunction and she received a new ResMed air sense 10 CPAP auto unit.  She was using a full facemask.  When I saw her following obtaining her new machine in November 2015 compliance was excellent.  At 13 cm water pressure AHI was excellent at 0.8/h.  With CPAP therapy, she had resolution of prior snoring, nonrestorative sleep, I did not have any residual daytime sleepiness.  I saw her on November 12, 2020 after not having seen her since 2015.  Over the past 6 years she admits to continued CPAP use with excellent cotmpliance.  She recently had a message on her machine stating imaging "end-of-life" of the CPAP motor.  She she had called twice on medical, her DME company who advised that she needed to see me for sleep evaluation prior to getting a new machine.  At her March 2022 evaluation she admitted to issues with insomnia since her father passed away.  Oftentimes she goes to bed very rarely but often wakes up around 2 AM and may stay up for some time before going back to bed.  She is unaware of breakthrough snoring.  A new Epworth Sleepiness Scale score was calculated in the office today and this endorsed at 11 as shown below:   Epworth  Sleepiness Scale: Situation   Chance of Dozing/Sleeping (0 = never , 1 = slight chance , 2 = moderate chance , 3 = high chance )   sitting and reading 1   watching TV 3   sitting inactive in a public place 2   being a passenger in a motor vehicle for an hour or more 1   lying down in the afternoon 3   sitting and talking to someone 0   sitting quietly after lunch (no alcohol) 1   while stopped for a few minutes in traffic as the driver 0   Total Score  11   She was unaware of breakthrough snoring, bruxism, hypnagogic hallucinations or cataplectic events.    I saw her on March 16, 2021.  Since her November 12, 2020 evaluation, she received a new ResMed air sense 11 AutoSet unit on Feb 09, 2021.  She notes that her machine is even more quiet than her previous one.  I was able to obtain a new download from February 15, 2019 through through July 6 12/2020.  Compliance is excellent with 100% use.  Average use is 8 hours and 44 minutes.  The unit is set at a minimum pressure of 12 with maximum of 18.  Her 95th percentile pressure is 15.1 with a maximum average pressure at 16.9.  AHI is excellent at 1.0.  She is unaware  of any breakthrough snoring.  She is sleeping well.  She denies residual daytime sleepiness.  An Epworth Sleepiness Scale score was recalculated in the office today and this endorsed at 5.    Since I last saw her, she has continued to be followed by Dr. Royann Shivers and last saw him on March 24, 2022 and was stable from a cardiovascular standpoint.  I last saw her on March 29, 2022 at which time she continued to use CPAP.  A download today from July 17 through March 27, 2022 confirmed excellent compliance with 100% use and average use at 9 hours and 49 minutes.  She received a new ResMed air sense 11 unit on Feb 09, 2021.  Pressure settings are 12 to 18 cm of water.  AHI is excellent at 0.7.  Her 95th percentile pressure is 15.2 with maximum average pressure 16.8 cm of water.  She believes she is sleeping  well.  She typically goes to bed at 10 PM but wakes up between 430 and 5 AM.  She often takes approximately 2 naps per week.  An Epworth Sleepiness Scale score was calculated in the office today and this endorsed at 9.  At times she admits that she wakes up with a dry mouth.  She has had a difficult summer with her oldest son dying at age 59 on February 10, 2022 resulting from pneumonia.  Also her husband had fallen in June.  During that evaluation she continued to have morbid obesity but was successful with a 10 pound weight loss from her prior evaluation.  Since I last saw her, she was evaluated by Dr. Royann Shivers on August 16, 2023.  She has continued to use CPAP therapy.  A download was obtained in the office today from January 27 through November 07, 2023.  She continues to be 100% compliant.  Often if she takes naps she also uses CPAP during her nap.  Leading to average usage on days used at 10 hours and 33 minutes.  Her's new ResMed AirSense 11 AutoSet unit is set at a pressure range of 12 to 18 cm of water and AHI is 1.6.  Her 95th percentile pressure is 14.2 with maximum average pressure 15.9.  She is sleeping well.  An Epworth Sleepiness Scale score was recalculated in the office today and this endorsed at 6 arguing against residual daytime sleepiness.  She presents for her sleep follow-up evaluation.   Past Medical History:  Diagnosis Date   Anemia    takes iron supplement   Anxiety    Asthma    daily and prn inhalers   Bone spur    Left foot   Chest pain    a. 10/2015: NST showing possible mild ischemia but most consistent with breast attenuation. Low-risk study.    Chronic diastolic CHF (congestive heart failure) (HCC)    Common migraine with intractable migraine 07/06/2018   DDD (degenerative disc disease), cervical    Degenerative disc disease    Depression    Diabetes mellitus (HCC)    Essential hypertension    Family history of early CAD    Former tobacco use    GERD  (gastroesophageal reflux disease)    GERD (gastroesophageal reflux disease)    Headache(784.0)    migraine-like   History of TIA (transient ischemic attack) early 2013   no weakness or deficits   History of UTI    Hyperlipidemia    Insomnia    Migraines    Morbid obesity (HCC)  Partial tear of rotator cuff(726.13)    Psoriatic arthritis (HCC)    Shoulder impingement 04/2012   left   Sleep apnea    uses CPAP nightly    Past Surgical History:  Procedure Laterality Date   ABDOMINAL HYSTERECTOMY  03/28/2002   complete   ANTERIOR CERVICAL DECOMP/DISCECTOMY FUSION  02/18/2010   C6-7   BREAST BIOPSY Right    BREAST BIOPSY Left 06/19/2023   MM LT BREAST BX W LOC DEV 1ST LESION IMAGE BX SPEC STEREO GUIDE 06/19/2023 [facility]   DILATION AND CURETTAGE OF UTERUS     2000   FOOT SURGERY Right 2015   KNEE ARTHROPLASTY     ROTATOR CUFF REPAIR Left 2013   TOTAL KNEE ARTHROPLASTY  09/07/2009   left   TOTAL KNEE ARTHROPLASTY  10/13/2008   right   TOTAL SHOULDER ARTHROPLASTY     UMBILICAL HERNIA REPAIR  03/28/2002   [facility]     WRIST SURGERY  2020    Current Medications: Outpatient Medications Prior to Visit  Medication Sig Dispense Refill   acidophilus (RISAQUAD) CAPS Take 1 capsule by mouth every morning.      albuterol (VENTOLIN HFA) 108 (90 Base) MCG/ACT inhaler      ALPRAZolam (XANAX) 0.5 MG tablet Take 0.5 mg by mouth 2 (two) times daily as needed.     amoxicillin (AMOXIL) 500 MG capsule Take 2,000 mg by mouth as needed. Prior to dental work     aspirin 81 MG tablet Take 81 mg by mouth daily.     atorvastatin (LIPITOR) 20 MG tablet Take 20 mg by mouth every evening.      Azelastine HCl 0.15 % SOLN as needed.     baclofen (LIORESAL) 10 MG tablet Take 10-20 mg by mouth See admin instructions. Weekly as needed     calcium carbonate (TUMS E-X 750) 750 MG chewable tablet as needed.     chlorproMAZINE (THORAZINE) 25 MG tablet Take 25-50 mg by mouth See admin instructions. every  3 hours as needed max twice month  1   dapagliflozin propanediol (FARXIGA) 10 MG TABS tablet Take 1 tablet (10 mg total) by mouth daily before breakfast. (Patient taking differently: Take 10 mg by mouth 2 (two) times a week.) 30 tablet 4   diclofenac (CATAFLAM) 50 MG tablet Take 50 mg by mouth 2 (two) times daily.     diclofenac sodium (VOLTAREN) 1 % GEL Apply three grams to three large joints up to three times daily 3 Tube 3   DULoxetine (CYMBALTA) 60 MG capsule Take 60 mg by mouth daily.   3   ferrous sulfate 325 (65 FE) MG tablet Take 325 mg by mouth 2 (two) times a week.     furosemide (LASIX) 40 MG tablet TAKE 1 TABLET BY MOUTH DAILY 90 tablet 3   HYDROcodone-acetaminophen (NORCO/VICODIN) 5-325 MG per tablet Take 1 tablet by mouth as needed for moderate pain.      hydrOXYzine (ATARAX/VISTARIL) 25 MG tablet Take 25 mg by mouth as directed.     irbesartan (AVAPRO) 75 MG tablet Take 37.5 mg by mouth. Take only if blood pressure above 130     loratadine (CLARITIN) 10 MG tablet Take 10 mg by mouth daily.     metFORMIN (GLUCOPHAGE) 500 MG tablet Take 500 mg by mouth daily with breakfast.     methocarbamol (ROBAXIN) 750 MG tablet      Multiple Vitamin (MULITIVITAMIN WITH MINERALS) TABS Take 1 tablet by mouth daily.  mupirocin nasal ointment (BACTROBAN) 2 % Place 1 application  into the nose 2 (two) times daily. Use one-half of tube in each . After application, press sides of nose together and gently massage. As Needed     NON FORMULARY at bedtime. CPAP     Olopatadine HCl (PATADAY OP) Place 1 drop into both eyes as needed.     ONE TOUCH ULTRA TEST test strip      ONETOUCH DELICA LANCETS 33G MISC      pantoprazole (PROTONIX) 40 MG tablet Take 40 mg by mouth daily.     Semaglutide, 1 MG/DOSE, (OZEMPIC, 1 MG/DOSE,) 2 MG/1.5ML SOPN Inject into the skin once a week.     spironolactone (ALDACTONE) 25 MG tablet TAKE 1/2 TABLET BY MOUTH ONCE DAILY 45 tablet 2   TALTZ 80 MG/ML pen INJECT 80MG  (1 PEN)  UNDER THE SKIN EVERY 4 WEEKS 3 mL 0   topiramate (TOPAMAX) 200 MG tablet Take 400 mg by mouth at bedtime. Take 2 tablets daily at bedtime     vitamin B-12 (CYANOCOBALAMIN) 500 MCG tablet Take 500 mcg by mouth daily.     No facility-administered medications prior to visit.     Allergies:   Patient has no known allergies.   Social History   Socioeconomic History   Marital status: Married    Spouse name: John   Number of children: 5   Years of education: some college   Highest education level: Not on file  Occupational History   Occupation: Disabled  Tobacco Use   Smoking status: Former    Current packs/day: 0.00    Average packs/day: 0.5 packs/day for 20.0 years (10.0 ttl pk-yrs)    Types: Cigarettes    Start date: 10/02/1979    Quit date: 10/02/1999    Years since quitting: 24.1    Passive exposure: Past   Smokeless tobacco: Never  Vaping Use   Vaping status: Never Used  Substance and Sexual Activity   Alcohol use: No   Drug use: No   Sexual activity: Yes    Birth control/protection: None  Other Topics Concern   Not on file  Social History Narrative   Lives with husband, John   Caffeine use: 1 cup coffee every morning   Right handed    Social Drivers of Corporate investment banker Strain: Not on file  Food Insecurity: Not on file  Transportation Needs: Not on file  Physical Activity: Not on file  Stress: Not on file  Social Connections: Not on file     Family History:  The patient's family history includes Breast cancer in her sister and sister; Cancer in her maternal grandmother and paternal grandfather; Coronary artery disease in her sister; Crohn's disease in her cousin, cousin, maternal aunt, and paternal aunt; Diabetes in her father, maternal grandmother, mother, paternal grandfather, sister, and sister; Hypertension in her father, maternal grandmother, and mother; Kidney disease in her father.   ROS General: Negative; No fevers, chills, or night sweats;  positive for morbid obesity HEENT: Negative; No changes in vision or hearing, sinus congestion, difficulty swallowing Pulmonary: Negative; No cough, wheezing, shortness of breath, hemoptysis Cardiovascular: History of hypertension, hyperlipidemia GI: Positive for GERD GU: Negative; No dysuria, hematuria, or difficulty voiding Musculoskeletal: Negative; no myalgias, joint pain, or weakness Hematologic/Oncology: Negative; no easy bruising, bleeding Endocrine: Positive for diabetes mellitus Neuro: Remote TIA Skin: Negative; No rashes or skin lesions Psychologic: Normal mood and affect  Physical Exam BP 130/82   Pulse 68  Ht 5\' 1"  (1.549 m)   Wt 221 lb 3.2 oz (100.3 kg)   SpO2 98%   BMI 41.80 kg/m   Repeat blood pressure by me was 124/80.  General: Alert, oriented, no distress.  Skin: normal turgor, no rashes, warm and dry HEENT: Normocephalic, atraumatic. Pupils equal round and reactive to light; sclera anicteric; extraocular muscles intact;  Nose without nasal septal hypertrophy Mouth/Parynx benign; Mallinpatti scale 4 Neck: Thick neck; no JVD, no carotid bruits; normal carotid upstroke Lungs: clear to ausculatation and percussion; no wheezing or rales Chest wall: without tenderness to palpitation Heart: PMI not displaced, RRR, s1 s2 normal, 1/6 systolic murmur, no diastolic murmur, no rubs, gallops, thrills, or heaves Abdomen: soft, nontender; no hepatosplenomehaly, BS+; abdominal aorta nontender and not dilated by palpation. Back: no CVA tenderness Pulses 2+ Musculoskeletal: full range of motion, normal strength, no joint deformities Extremities: no clubbing cyanosis or edema, Homan's sign negative  Neurologic: grossly nonfocal; Cranial nerves grossly wnl Psychiatric: Negative    PHYSICAL EXAM:   VS:  BP 130/82   Pulse 68   Ht 5\' 1"  (1.549 m)   Wt 221 lb 3.2 oz (100.3 kg)   SpO2 98%   BMI 41.80 kg/m    Repeat blood pressure by me was 126/68.   Wt Readings from  Last 3 Encounters:  11/08/23 221 lb 3.2 oz (100.3 kg)  08/16/23 221 lb 9.6 oz (100.5 kg)  08/08/23 221 lb 3.2 oz (100.3 kg)    General: Alert, oriented, no distress.  Morbid obesity.  Since her prior evaluations,  weight has decreased from 273 > 265  > 221 today. Skin: normal turgor, no rashes, warm and dry HEENT: Normocephalic, atraumatic. Pupils equal round and reactive to light; sclera anicteric; extraocular muscles intact;  Nose without nasal septal hypertrophy Mouth/Parynx benign; Mallinpatti scale 4 Neck: No JVD, no carotid bruits; normal carotid upstroke Lungs: clear to ausculatation and percussion; no wheezing or rales Chest wall: without tenderness to palpitation Heart: PMI not displaced, RRR, s1 s2 normal, 1/6 systolic murmur, no diastolic murmur, no rubs, gallops, thrills, or heaves Abdomen: soft, nontender; no hepatosplenomehaly, BS+; abdominal aorta nontender and not dilated by palpation. Back: no CVA tenderness Pulses 2+ Musculoskeletal: full range of motion, normal strength, no joint deformities Extremities: no clubbing cyanosis or edema, Homan's sign negative  Neurologic: grossly nonfocal; Cranial nerves grossly wnl Psychologic: Normal mood and affect    Studies/Labs Reviewed:   EKG Interpretation Date/Time:  Wednesday November 08 2023 12:46:28 EST Ventricular Rate:  68 PR Interval:  146 QRS Duration:  90 QT Interval:  390 QTC Calculation: 414 R Axis:   29  Text Interpretation: Normal sinus rhythm Normal ECG When compared with ECG of 16-Aug-2023 10:27, No significant change was found Confirmed by Nicki Guadalajara ([id number]) on 11/08/2023 1:10:49 PM    March 29, 2022 ECG (independently read by me):  NSR at 92; no ectopy  March 11/2020 ECG (independently read by me): Normal sinus rhythm at 89, no ectopy, normal intervals  Recent Labs:    Latest Ref Rng & Units 08/17/2023   11:37 AM 06/02/2023   10:26 AM 03/03/2023    8:28 AM  BMP  Glucose 65 - 99 mg/dL 97  75  604    BUN 7 - 25 mg/dL 20  12  15    Creatinine 0.50 - 1.05 mg/dL 5.40  9.81  1.91   BUN/Creat Ratio 6 - 22 (calc) SEE NOTE:  SEE NOTE:  SEE NOTE:   Sodium 135 -  146 mmol/L 142  139  140   Potassium 3.5 - 5.3 mmol/L 4.2  4.3  3.9   Chloride 98 - 110 mmol/L 111  108  109   CO2 20 - 32 mmol/L 24  24  23    Calcium 8.6 - 10.4 mg/dL 9.6  9.9  9.1         Latest Ref Rng & Units 08/17/2023   11:37 AM 06/02/2023   10:26 AM 03/03/2023    8:28 AM  Hepatic Function  Total Protein 6.1 - 8.1 g/dL 6.8  6.9  6.4   AST 10 - 35 U/L 11  12  9    ALT 6 - 29 U/L 12  12  9    Total Bilirubin 0.2 - 1.2 mg/dL 0.3  0.3  0.2        Latest Ref Rng & Units 08/17/2023   11:37 AM 06/02/2023   10:26 AM 03/03/2023    8:28 AM  CBC  WBC 3.8 - 10.8 Thousand/uL 8.6  7.5  9.0   Hemoglobin 11.7 - 15.5 g/dL 13.0  86.5  78.4   Hematocrit 35.0 - 45.0 % 38.2  38.9  37.8   Platelets 140 - 400 Thousand/uL 243  255  248    Lab Results  Component Value Date   MCV 86.8 08/17/2023   MCV 87.2 06/02/2023   MCV 84.9 03/03/2023   Lab Results  Component Value Date   TSH 0.95 10/22/2015   No results found for: "HGBA1C"   BNP    Component Value Date/Time   BNP 102.6 (H) 06/24/2015 0902    ProBNP No results found for: "PROBNP"   Lipid Panel     Component Value Date/Time   CHOL 143 03/12/2013 0200   TRIG 128 03/12/2013 0200   HDL 42 03/12/2013 0200   CHOLHDL 3.4 03/12/2013 0200   VLDL 26 03/12/2013 0200   LDLCALC 75 03/12/2013 0200     RADIOLOGY: No results found.   Additional studies/ records that were reviewed today include:  I reviewed the patient's original sleep study which was done at the [facility] in April 2012 which showed an AHI of 8.9/h.  Events were worse with supine sleep.  Oxygen nadir was 81% with non-REM sleep and 80% with REM sleep.  I obtained a new download today with her new CPAP air sense 11 AutoSet machine from February 26, 2022 through March 27, 2022.  A new download  was obtained from January 27 through November 07, 2023.   ASSESSMENT:    1. OSA (obstructive sleep apnea) on CPAP   2. Essential hypertension   3. Chronic diastolic heart failure (HCC)   4. Hypercholesterolemia   5. Morbid obesity (HCC)   6. Type 2 diabetes mellitus with obesity [facility])      PLAN:  Ms. Kayelyn Lemon is a 63 year old female who has a history of morbid obesity, chronic diastolic heart failure, hypertension, hyperlipidemia, diabetes mellitus, remote TIA, and obstructive sleep apnea diagnosed in 2012.  She was started on CPAP therapy in 2012 and received a new machine in 2015 after her first machine malfunctioned.  She had a ResMed air sense 10 CPAP auto machine 6-1/2 years with excellent compliance and cannot sleep without it.  A download from October 13, 2020 through November 11, 2020 confirmed excellent compliance with average use at 10 hours and 4 minutes per night.  At 13 cm set pressure, AHI 0.9 cm of water.  There are some nights with  occasional mask leak.  In 2022, she was in need for a new machine and ultimately received a ResMed air sense 11 AutoSet unit with set up date on Feb 09, 2021.  She has been using a fullface mask.  Her previous DME company was [employer] and this was ultimately switched to [employer].  Her most recent download confirms continued excellent compliance with 100% use with average use at 10 hours and 33 minutes.  There are days where she takes naps and uses CPAP for her naps.  At a pressure of 12 to 18 cm of water, AHI is 1.6.  95th percentile pressure is 14.2 with maximum average 15.9.  She has been using a fullface mask which she prefers compared to the F30i mask.  Her blood pressure today is stable on her current medical regimen of furosemide 40 mg, irbesartan 37.5 mg, spironolactone 12.5 mg.  She is diabetic on metformin and is on Ozempic.  She is on atorvastatin 20 mg for hyperlipidemia and is followed closely by Dr. Royann Shivers.  From a sleep perspective, she is  doing well.  I have recommended follow-up sleep evaluation as needed and I discussed my pending retirement so that she will need to see Dr. Mayford Knife or another sleep provider.  She will follow-up with Dr. Royann Shivers for Cardiologic care.    Medication Adjustments/Labs and Tests Ordered: Current medicines are reviewed at length with the patient today.  Concerns regarding medicines are outlined above.  Medication changes, Labs and Tests ordered today are listed in the Patient Instructions below. Patient Instructions  Medication Instructions:  No medication changes were made during today's visit  *If you need a refill on your cardiac medications before your next appointment, please call your pharmacy*   Lab Work: No labs were ordered during today's visit.  If you have labs (blood work) drawn today and your tests are completely normal, you will receive your results only by: MyChart Message (if you have MyChart) OR A paper copy in the mail If you have any lab test that is abnormal or we need to change your treatment, we will call you to review the results.   Testing/Procedures: No procedures ordered today.    Follow-Up: At [facility] you and your health needs are our priority.  As part of our continuing mission to provide you with exceptional heart care, we have created designated Provider Care Teams.  These Care Teams include your primary Cardiologist (physician) and Advanced Practice Providers (APPs -  Physician Assistants and Nurse Practitioners) who all work together to provide you with the care you need, when you need it.  We recommend signing up for the patient portal called "MyChart".  Sign up information is provided on this After Visit Summary.  MyChart is used to connect with patients for Virtual Visits (Telemedicine).  Patients are able to view lab/test results, encounter notes, upcoming appointments, etc.  Non-urgent messages can be sent to your provider as well.   To learn  more about what you can do with MyChart, go to [email or web address].    Your next appointment:   As needed for sleep concerns   Provider:   Dr. Armanda Magic     Other Instructions        1st Floor: - Lobby - Registration  - Pharmacy  - Lab - Cafe   2nd Floor: - PV Lab - Diagnostic Testing (echo, CT, nuclear med)   3rd Floor: - Vacant   4th Floor: - TCTS (cardiothoracic surgery) - AFib  Clinic - Structural Heart Clinic - Vascular Surgery  - Vascular Ultrasound   5th Floor: - HeartCare Cardiology (general and EP) - Clinical Pharmacy for coumadin, hypertension, lipid, weight-loss medications, and med management appointments      Valet parking services will be available as well.      If you have any questions or concerns regarding your c-pap, bi-pap or sleep accessories, please contact Brandie Rorie at [phone].       Signed, Nicki Guadalajara, MD, [facility], ABSM Diplomat, American Board of Sleep Medicine  11/10/2023 5:08 PM    [facility] [address], [place], [place]  [address] Phone: [phone]

## 2023-11-08 NOTE — Patient Instructions (Addendum)
 Medication Instructions:  No medication changes were made during today's visit  *If you need a refill on your cardiac medications before your next appointment, please call your pharmacy*   Lab Work: No labs were ordered during today's visit.  If you have labs (blood work) drawn today and your tests are completely normal, you will receive your results only by: MyChart Message (if you have MyChart) OR A paper copy in the mail If you have any lab test that is abnormal or we need to change your treatment, we will call you to review the results.   Testing/Procedures: No procedures ordered today.    Follow-Up: At [facility], you and your health needs are our priority.  As part of our continuing mission to provide you with exceptional heart care, we have created designated Provider Care Teams.  These Care Teams include your primary Cardiologist (physician) and Advanced Practice Providers (APPs -  Physician Assistants and Nurse Practitioners) who all work together to provide you with the care you need, when you need it.  We recommend signing up for the patient portal called "MyChart".  Sign up information is provided on this After Visit Summary.  MyChart is used to connect with patients for Virtual Visits (Telemedicine).  Patients are able to view lab/test results, encounter notes, upcoming appointments, etc.  Non-urgent messages can be sent to your provider as well.   To learn more about what you can do with MyChart, go to [email or web address].    Your next appointment:   As needed for sleep concerns   Provider:   Dr. Armanda Magic     Other Instructions        1st Floor: - Lobby - Registration  - Pharmacy  - Lab - Cafe   2nd Floor: - PV Lab - Diagnostic Testing (echo, CT, nuclear med)   3rd Floor: - Vacant   4th Floor: - TCTS (cardiothoracic surgery) - AFib Clinic - Structural Heart Clinic - Vascular Surgery  - Vascular Ultrasound   5th Floor: -  [facility] (general and EP) - Clinical Pharmacy for coumadin, hypertension, lipid, weight-loss medications, and med management appointments      Valet parking services will be available as well.      If you have any questions or concerns regarding your c-pap, bi-pap or sleep accessories, please contact Brandie Rorie at [phone].

## 2023-11-10 ENCOUNTER — Encounter: Payer: Self-pay | Admitting: Cardiovascular Disease

## 2023-11-16 ENCOUNTER — Other Ambulatory Visit: Payer: Self-pay | Admitting: *Deleted

## 2023-11-16 ENCOUNTER — Other Ambulatory Visit: Payer: Self-pay | Admitting: Physician Assistant

## 2023-11-16 DIAGNOSIS — Z9225 Personal history of immunosupression therapy: Secondary | ICD-10-CM

## 2023-11-16 DIAGNOSIS — Z79899 Other long term (current) drug therapy: Secondary | ICD-10-CM | POA: Diagnosis not present

## 2023-11-16 DIAGNOSIS — Z111 Encounter for screening for respiratory tuberculosis: Secondary | ICD-10-CM | POA: Diagnosis not present

## 2023-11-16 DIAGNOSIS — M47819 Spondylosis without myelopathy or radiculopathy, site unspecified: Secondary | ICD-10-CM

## 2023-11-16 NOTE — Telephone Encounter (Signed)
 Last Fill: 08/21/2023  Labs: 08/17/2023 CMP WNL  CBC WNL  TB Gold: 12/12/2022   Next Visit: 01/09/2024  Last Visit: 08/08/2023  [id number]   Current Dose per office note 08/08/2023: Taltz 80 mg sq injections every 28 days   Left message to advise patient she is due to update her labs.   Okay to refill Taltz?

## 2023-11-17 NOTE — Progress Notes (Signed)
 CBC and CMP WNL

## 2023-11-19 LAB — QUANTIFERON-TB GOLD PLUS
Mitogen-NIL: >10 [IU]/mL
NIL: 0.06 [IU]/mL
QuantiFERON-TB Gold Plus: NEGATIVE
TB1-NIL: 0 [IU]/mL
TB2-NIL: 0.02 [IU]/mL

## 2023-11-19 LAB — COMPLETE METABOLIC PANEL WITH GFR
AG Ratio: 1.6 (calc) (ref 1.0–2.5)
ALT: 16 U/L (ref 6–29)
AST: 14 U/L (ref 10–35)
Albumin: 4.3 g/dL (ref 3.6–5.1)
Alkaline phosphatase (APISO): 101 U/L (ref 37–153)
BUN: 18 mg/dL (ref 7–25)
CO2: 26 mmol/L (ref 20–32)
Calcium: 10 mg/dL (ref 8.6–10.4)
Chloride: 110 mmol/L (ref 98–110)
Creat: 0.89 mg/dL (ref 0.50–1.05)
Globulin: 2.7 g/dL (ref 1.9–3.7)
Glucose, Bld: 75 mg/dL (ref 65–99)
Potassium: 4.3 mmol/L (ref 3.5–5.3)
Sodium: 142 mmol/L (ref 135–146)
Total Bilirubin: 0.3 mg/dL (ref 0.2–1.2)
Total Protein: 7 g/dL (ref 6.1–8.1)
eGFR: 73 mL/min/{1.73_m2} (ref >=60)

## 2023-11-19 LAB — CBC WITH DIFFERENTIAL/PLATELET
Absolute Lymphocytes: 2147 {cells}/uL (ref 850–3900)
Absolute Monocytes: 543 {cells}/uL (ref 200–950)
Basophils Absolute: 41 {cells}/uL (ref 0–200)
Basophils Relative: 0.5 %
Eosinophils Absolute: 324 {cells}/uL (ref 15–500)
Eosinophils Relative: 4 %
HCT: 39.3 % (ref 35.0–45.0)
Hemoglobin: 12.3 g/dL (ref 11.7–15.5)
MCH: 27.5 pg (ref 27.0–33.0)
MCHC: 31.3 g/dL — ABNORMAL LOW (ref 32.0–36.0)
MCV: 87.9 fL (ref 80.0–100.0)
MPV: 10.1 fL (ref 7.5–12.5)
Monocytes Relative: 6.7 %
Neutro Abs: 5046 {cells}/uL (ref 1500–7800)
Neutrophils Relative %: 62.3 %
Platelets: 259 10*3/uL (ref 140–400)
RBC: 4.47 10*6/uL (ref 3.80–5.10)
RDW: 13.9 % (ref 11.0–15.0)
Total Lymphocyte: 26.5 %
WBC: 8.1 10*3/uL (ref 3.8–10.8)

## 2023-11-20 NOTE — Progress Notes (Signed)
 TB gold negative

## 2023-11-21 ENCOUNTER — Other Ambulatory Visit: Payer: Self-pay | Admitting: Cardiovascular Disease

## 2023-12-14 ENCOUNTER — Other Ambulatory Visit: Payer: Self-pay | Admitting: *Deleted

## 2023-12-14 DIAGNOSIS — Z111 Encounter for screening for respiratory tuberculosis: Secondary | ICD-10-CM

## 2023-12-14 DIAGNOSIS — Z9225 Personal history of immunosupression therapy: Secondary | ICD-10-CM

## 2023-12-14 DIAGNOSIS — M47819 Spondylosis without myelopathy or radiculopathy, site unspecified: Secondary | ICD-10-CM

## 2023-12-14 DIAGNOSIS — Z79899 Other long term (current) drug therapy: Secondary | ICD-10-CM

## 2023-12-14 MED ORDER — TALTZ 80 MG/ML ~~LOC~~ SOAJ: SUBCUTANEOUS | 0 refills | Status: DC

## 2023-12-14 NOTE — Telephone Encounter (Signed)
 Patient contacted the office requesting a refill on her Taltz. Patient states she was advised the patient assistance program has tried to reach out to our family to refill. We have not received any calls, messages or faxes requesting refill. Patient advised.   Last Fill: 11/16/2023 (30 day supply)  Labs: 11/16/2023 CBC and CMP WNL   TB Gold: 11/16/2023 Neg    Next Visit: 01/09/2024  Last Visit: 08/08/2023  [id number]   Current Dose per office note 08/08/2023: Taltz 80 mg sq injections every 28 days.   Okay to refill Taltz?

## 2023-12-19 ENCOUNTER — Ambulatory Visit
Admission: RE | Admit: 2023-12-19 | Discharge: 2023-12-19 | Disposition: A | Discharge status: Home / Self Care | Payer: Medicare HMO | Source: Ambulatory Visit | Attending: Internal Medicine | Admitting: Internal Medicine

## 2023-12-19 DIAGNOSIS — R921 Mammographic calcification found on diagnostic imaging of breast: Secondary | ICD-10-CM | POA: Diagnosis not present

## 2023-12-26 NOTE — Progress Notes (Unsigned)
 Office Visit Note  Patient: [name]             Date of Birth: December 10, 1960           MRN: [id number]             PCP: Margarete Sharps, MD Referring: Margarete Sharps, MD Visit Date: 01/09/2024 Occupation: @GUAROCC @  Subjective:  Medication monitoring   History of Present Illness: Lailanie Bedi is a 63 y.o. female with history of spondyloarthropathy and DDD.  Patient remains on Taltz  80 mg sq injections every 28 days.  She is tolerating Taltz  without any side effects or injection site reactions.  She has not had any recent gaps in therapy.  She denies any signs or symptoms of a flare recently.  She denies any SI joint discomfort at this time.  She denies any joint swelling.  She has not had any morning stiffness or difficulty performing ADLs.  She has been using Salonpas patches as needed for symptomatic relief She is taking diclofenac  50 mg 1 tablet by mouth twice daily and norco sparingly for pain relief.   Patient states that she is now up to about 74 pounds of weight loss.  Patient states that her mobility has improved since losing weight.     Activities of Daily Living:  Patient reports morning stiffness for 0 minutes.   Patient Reports nocturnal pain.  Difficulty dressing/grooming: Denies Difficulty climbing stairs: Denies Difficulty getting out of chair: Denies Difficulty using hands for taps, buttons, cutlery, and/or writing: Denies  Review of Systems  Constitutional:  Negative for fatigue.  HENT:  Positive for nose dryness. Negative for mouth sores and mouth dryness.   Eyes:  Negative for pain and dryness.  Respiratory:  Negative for shortness of breath and difficulty breathing.   Cardiovascular:  Negative for chest pain and palpitations.  Gastrointestinal:  Positive for constipation. Negative for blood in stool and diarrhea.  Endocrine: Negative for increased urination.  Genitourinary:  Negative for involuntary urination.  Musculoskeletal:  Positive for  joint pain, joint pain, joint swelling, myalgias and myalgias. Negative for gait problem, muscle weakness, morning stiffness and muscle tenderness.  Skin:  Negative for color change, rash, hair loss and sensitivity to sunlight.  Allergic/Immunologic: Negative for susceptible to infections.  Neurological:  Positive for numbness, headaches and parasthesias. Negative for dizziness.  Hematological:  Negative for swollen glands.  Psychiatric/Behavioral:  Negative for depressed mood and sleep disturbance. The patient is not nervous/anxious.     PMFS History:  Patient Active Problem List   Diagnosis Date Noted   Eddie Good Quervain's disease (radial styloid tenosynovitis) 11/27/2018   Vertigo 10/18/2018   Common migraine with intractable migraine 07/06/2018   Type 2 diabetes mellitus with obesity (HCC) 06/29/2018   Spondyloarthropathy 07/26/2016   High risk medication use 07/26/2016   Chronic low back pain 07/26/2016   H/O total knee replacement, bilateral 07/26/2016   Chronic diastolic heart failure (HCC) 11/29/2015   Dyspnea 06/03/2015   Lung nodule seen on imaging study, CTA of chest, 6 mm nodule, followed by Dr. Mamie Searles 03/25/2013   Family history of coronary artery disease 03/12/2013   Super obese 03/12/2013   Chest pain with low risk of acute coronary syndrome - most likely musculoskeletal, negative nuc study 03/11/2013   Full thickness rotator cuff tear 05/15/2012   Hypercholesterolemia    DJD (degenerative joint disease), cervical    OSA (obstructive sleep apnea)    Essential hypertension    History of TIA (transient  ischemic attack) 9/13    Incomplete tear of rotator cuff    Shoulder impingement 04/12/2012    Past Medical History:  Diagnosis Date   Anemia    takes iron supplement   Anxiety    Asthma    daily and prn inhalers   Bone spur    Left foot   Chest pain    a. 10/2015: NST showing possible mild ischemia but most consistent with breast attenuation. Low-risk study.     Chronic diastolic CHF (congestive heart failure) (HCC)    Common migraine with intractable migraine 07/06/2018   DDD (degenerative disc disease), cervical    Degenerative disc disease    Depression    Diabetes mellitus (HCC)    Essential hypertension    Family history of early CAD    Former tobacco use    GERD (gastroesophageal reflux disease)    GERD (gastroesophageal reflux disease)    Headache(784.0)    migraine-like   History of TIA (transient ischemic attack) early 2013   no weakness or deficits   History of UTI    Hyperlipidemia    Insomnia    Migraines    Morbid obesity (HCC)    Partial tear of rotator cuff(726.13)    Psoriatic arthritis (HCC)    Shoulder impingement 04/2012   left   Sleep apnea    uses CPAP nightly    Family History  Problem Relation Age of Onset   Diabetes Mother    Hypertension Mother    Kidney disease Father    Hypertension Father    Diabetes Father    Breast cancer Sister    Diabetes Sister    Breast cancer Sister    Stroke Sister    Coronary artery disease Sister    Diabetes Sister    Crohn's disease Maternal Aunt    Crohn's disease Paternal Aunt    Cancer Maternal Grandmother    Diabetes Maternal Grandmother    Hypertension Maternal Grandmother    Diabetes Paternal Grandfather    Cancer Paternal Grandfather    Crohn's disease Cousin    Crohn's disease Cousin    Past Surgical History:  Procedure Laterality Date   ABDOMINAL HYSTERECTOMY  03/28/2002   complete   ANTERIOR CERVICAL DECOMP/DISCECTOMY FUSION  02/18/2010   C6-7   BREAST BIOPSY Right    BREAST BIOPSY Left 06/19/2023   MM LT BREAST BX W LOC DEV 1ST LESION IMAGE BX SPEC STEREO GUIDE 06/19/2023 [facility]   DILATION AND CURETTAGE OF UTERUS     2000   FOOT SURGERY Right 2015   KNEE ARTHROPLASTY     ROTATOR CUFF REPAIR Left 2013   TOTAL KNEE ARTHROPLASTY  09/07/2009   left   TOTAL KNEE ARTHROPLASTY  10/13/2008   right   TOTAL SHOULDER ARTHROPLASTY     UMBILICAL  HERNIA REPAIR  03/28/2002   [facility]     WRIST SURGERY  2020   Social History   Social History Narrative   Lives with husband, John   Caffeine use: 1 cup coffee every morning   Right handed    Immunization History  Administered Date(s) Administered   Influenza-Unspecified 06/05/2018, 06/21/2021   PFIZER(Purple Top)SARS-COV-2 Vaccination 11/28/2019, 12/18/2019, 06/02/2020, 02/21/2021     Objective: Vital Signs: BP (!) 152/88 (BP Location: Left Arm, Patient Position: Sitting, Cuff Size: Normal)   Pulse 84   Resp 16   Ht 5' 1.5" (1.562 m)   Wt 210 lb 9.6 oz (95.5 kg)   BMI 39.15 kg/m  Physical Exam Vitals and nursing note reviewed.  Constitutional:      Appearance: She is well-developed.  HENT:     Head: Normocephalic and atraumatic.  Eyes:     Conjunctiva/sclera: Conjunctivae normal.  Cardiovascular:     Rate and Rhythm: Normal rate and regular rhythm.     Heart sounds: Normal heart sounds.  Pulmonary:     Effort: Pulmonary effort is normal.     Breath sounds: Normal breath sounds.  Abdominal:     General: Bowel sounds are normal.     Palpations: Abdomen is soft.  Musculoskeletal:     Cervical back: Normal range of motion.  Lymphadenopathy:     Cervical: No cervical adenopathy.  Skin:    General: Skin is warm and dry.     Capillary Refill: Capillary refill takes less than 2 seconds.  Neurological:     Mental Status: She is alert and oriented to person, place, and time.  Psychiatric:        Behavior: Behavior normal.      Musculoskeletal Exam: C-spine has slightly limited lateral rotation bilaterally.  No midline spinal tenderness.  No SI joint tenderness.  Shoulder joints have good range of motion with some discomfort bilaterally.  Elbow joints, wrist joints, MCPs, PIPs, DIPs have good range of motion with no synovitis.  Complete fist formation bilaterally.  Hip joints have good range of motion with no groin pain.  Bilateral knee replacements have good range of  motion with no warmth or effusion.  Ankle joints have good range of motion with no tenderness or joint swelling.  No evidence of Achilles tendinitis.  CDAI Exam: CDAI Score: -- Patient Global: --; Provider Global: -- Swollen: --; Tender: -- Joint Exam 01/09/2024   No joint exam has been documented for this visit   There is currently no information documented on the homunculus. Go to the Rheumatology activity and complete the homunculus joint exam.  Investigation: No additional findings.  Imaging: MM 3D DIAGNOSTIC MAMMOGRAM UNILATERAL LEFT BREAST Result Date: 12/19/2023 CLINICAL DATA:  63 year old female presents for six-month follow-up of biopsied LEFT breast calcifications demonstrating fibroadenomatoid calcifications. EXAM: DIGITAL DIAGNOSTIC UNILATERAL LEFT MAMMOGRAM WITH TOMOSYNTHESIS AND CAD TECHNIQUE: Left digital diagnostic mammography and breast tomosynthesis was performed. The images were evaluated with computer-aided detection. COMPARISON:  Previous exam(s). ACR Breast Density Category b: There are scattered areas of fibroglandular density. FINDINGS: Full field and magnification views of the LEFT breast demonstrate unchanged remaining coarse heterogeneous UPPER-OUTER LEFT breast calcifications with biopsy clip in this area. These are compatible with fibroadenomatoid calcifications. IMPRESSION: Unchanged remaining fibroadenomatoid calcifications within the UPPER-OUTER LEFT breast. No further imaging follow-up recommended. RECOMMENDATION: Bilateral screening mammogram in 6 months to resume annual mammogram schedule. I have discussed the findings and recommendations with the patient. If applicable, a reminder letter will be sent to the patient regarding the next appointment. BI-RADS CATEGORY  2: Benign. Electronically Signed   By: Sundra Engel [name]   On: 12/19/2023 12:09    Recent Labs: Lab Results  Component Value Date   WBC 8.1 11/16/2023   HGB 12.3 11/16/2023   PLT 259 11/16/2023    NA 142 11/16/2023   K 4.3 11/16/2023   CL 110 11/16/2023   CO2 26 11/16/2023   GLUCOSE 75 11/16/2023   BUN 18 11/16/2023   CREATININE 0.89 11/16/2023   BILITOT 0.3 11/16/2023   ALKPHOS 119 06/27/2018   AST 14 11/16/2023   ALT 16 11/16/2023   PROT 7.0 11/16/2023   ALBUMIN  3.6 06/27/2018   CALCIUM  10.0 11/16/2023   GFRAA 79 01/20/2021   QFTBGOLDPLUS NEGATIVE 11/16/2023    Speciality Comments: Patient recieves Cosenty[employer]  through [employer] 04/29/2019  Procedures:  No procedures performed Allergies: Patient has no known allergies.   Assessment / Plan:     Visit Diagnoses: Spondyloarthropathy - MRI positive for sacroiliitis, HLA-B27 negative, elevated ESR: She has no synovitis or dactylitis on examination today.  No SI joint tenderness upon palpation.  No midline spinal tenderness.  No evidence of Achilles tendinitis or plantar fasciitis.  Her mobility has improved since losing almost 75 pounds. No morning stiffness or difficulty with ADLs.  Patient remains on Taltz  80 mg subcutaneous injections every 28 days.  She is tolerating Taltz  without any side effects or injection site reactions.  She has not had any recent or recurrent infections.  Her symptoms remain stable on the current treatment regimen. She will remain on Taltz  as monotherapy.  She was advised to notify us  if she develop signs or symptoms of a flare.  She will follow-up in the office in 5 months or sooner if needed.  High risk medication use - Taltz  80 mg sq injections every 28 days CBC and CMP updated on 11/16/23.  Her next lab work will be due in June and every 3 months. TB gold negative on 11/16/23. No recent or recurrent infections.  Discussed the importance of holding taltz  if she develops signs or symptoms of an infection and to resume once the infection has completely cleared.   Sacroiliitis [facility]): No SI joint tenderness upon palpation today.  She has been using Salonpas patches as needed for  symptomatic relief.  DDD (degenerative disc disease), cervical: C-spine has slightly limited range of motion with lateral rotation.  No symptoms of radiculopathy at this time.  No midline spinal tenderness in the cervical region.  Trapezius muscle spasm: Not currently symptomatic.  Iliotibial band syndrome of right side: Not currently symptomatic.  Trochanteric bursitis, left hip: Not currently symptomatic.  H/O total knee replacement, bilateral: Doing well.  No warmth or effusion noted.  Her mobility has improved since working on weight loss.  Other medical conditions are listed as follows:  Lung nodule seen on imaging study, CTA of chest, 6 mm nodule, followed by Dr. Mamie Searles  History of hypertension: Blood pressure was elevated today in the office and was rechecked prior to leaving.  Patient was advised to monitor blood pressure closely and to reach out to PCP if her blood pressure remains elevated.  Chronic diastolic heart failure (HCC)  History of sleep apnea  History of hyperlipidemia  Medial epicondylitis of both elbows  History of diabetes mellitus  History of TIA (transient ischemic attack)  Follow-Up Instructions: Return in about 5 months (around 06/10/2024) for Spondyloarthropathy.   Romayne Clubs, PA-C  Note - This record has been created using Dragon software.  Chart creation errors have been sought, but may not always  have been located. Such creation errors do not reflect on  the standard of medical care.

## 2024-01-02 ENCOUNTER — Ambulatory Visit: Payer: Medicare HMO | Admitting: Cardiovascular Disease

## 2024-01-09 ENCOUNTER — Encounter: Payer: Self-pay | Admitting: Physician Assistant

## 2024-01-09 ENCOUNTER — Ambulatory Visit: Payer: Medicare HMO | Attending: Physician Assistant | Admitting: Physician Assistant

## 2024-01-09 VITALS — BP 152/88 | HR 84 | Resp 16 | Ht 61.5 in | Wt 210.6 lb

## 2024-01-09 DIAGNOSIS — M7062 Trochanteric bursitis, left hip: Secondary | ICD-10-CM

## 2024-01-09 DIAGNOSIS — R911 Solitary pulmonary nodule: Secondary | ICD-10-CM | POA: Diagnosis not present

## 2024-01-09 DIAGNOSIS — Z8679 Personal history of other diseases of the circulatory system: Secondary | ICD-10-CM

## 2024-01-09 DIAGNOSIS — M7631 Iliotibial band syndrome, right leg: Secondary | ICD-10-CM | POA: Diagnosis not present

## 2024-01-09 DIAGNOSIS — I5032 Chronic diastolic (congestive) heart failure: Secondary | ICD-10-CM | POA: Diagnosis not present

## 2024-01-09 DIAGNOSIS — Z8673 Personal history of transient ischemic attack (TIA), and cerebral infarction without residual deficits: Secondary | ICD-10-CM

## 2024-01-09 DIAGNOSIS — M7701 Medial epicondylitis, right elbow: Secondary | ICD-10-CM

## 2024-01-09 DIAGNOSIS — Z79899 Other long term (current) drug therapy: Secondary | ICD-10-CM

## 2024-01-09 DIAGNOSIS — M47819 Spondylosis without myelopathy or radiculopathy, site unspecified: Secondary | ICD-10-CM

## 2024-01-09 DIAGNOSIS — M461 Sacroiliitis, not elsewhere classified: Secondary | ICD-10-CM | POA: Diagnosis not present

## 2024-01-09 DIAGNOSIS — M62838 Other muscle spasm: Secondary | ICD-10-CM

## 2024-01-09 DIAGNOSIS — Z8639 Personal history of other endocrine, nutritional and metabolic disease: Secondary | ICD-10-CM

## 2024-01-09 DIAGNOSIS — M5030 Other cervical disc degeneration, unspecified cervical region: Secondary | ICD-10-CM

## 2024-01-09 DIAGNOSIS — Z96653 Presence of artificial knee joint, bilateral: Secondary | ICD-10-CM | POA: Diagnosis not present

## 2024-01-09 DIAGNOSIS — M7702 Medial epicondylitis, left elbow: Secondary | ICD-10-CM

## 2024-01-09 DIAGNOSIS — Z8669 Personal history of other diseases of the nervous system and sense organs: Secondary | ICD-10-CM

## 2024-01-09 NOTE — Patient Instructions (Signed)

## 2024-02-16 ENCOUNTER — Other Ambulatory Visit: Payer: Self-pay | Admitting: *Deleted

## 2024-02-16 ENCOUNTER — Other Ambulatory Visit: Payer: Self-pay | Admitting: Physician Assistant

## 2024-02-16 DIAGNOSIS — Z79899 Other long term (current) drug therapy: Secondary | ICD-10-CM

## 2024-02-16 DIAGNOSIS — M47819 Spondylosis without myelopathy or radiculopathy, site unspecified: Secondary | ICD-10-CM

## 2024-02-16 NOTE — Telephone Encounter (Signed)
 Last Fill: 12/14/2023  Labs: 11/16/2023 CBC and CMP WNL   TB Gold: 11/16/2023 TB gold negative   Next Visit: 06/11/2024  Last Visit: 01/09/2024  [id number]   Current Dose per office note 01/09/2024: Taltz  80 mg sq injections every 28 days   Patient contacted the office and states she took her last dose of Taltz  today. Patient states she did not trust the pharmacy to ask for a refill so she had called the office. Patient states she will come to the office today to update her labs.   Okay to refill Taltz ?

## 2024-02-17 ENCOUNTER — Ambulatory Visit: Payer: Self-pay | Admitting: Rheumatology

## 2024-02-17 LAB — CBC WITH DIFFERENTIAL/PLATELET
Absolute Lymphocytes: 1796 {cells}/uL (ref 850–3900)
Absolute Monocytes: 469 {cells}/uL (ref 200–950)
Basophils Absolute: 43 {cells}/uL (ref 0–200)
Basophils Relative: 0.6 %
Eosinophils Absolute: 249 {cells}/uL (ref 15–500)
Eosinophils Relative: 3.5 %
HCT: 39.5 % (ref 35.0–45.0)
Hemoglobin: 12.1 g/dL (ref 11.7–15.5)
MCH: 27.3 pg (ref 27.0–33.0)
MCHC: 30.6 g/dL — ABNORMAL LOW (ref 32.0–36.0)
MCV: 89 fL (ref 80.0–100.0)
MPV: 9.8 fL (ref 7.5–12.5)
Monocytes Relative: 6.6 %
Neutro Abs: 4544 {cells}/uL (ref 1500–7800)
Neutrophils Relative %: 64 %
Platelets: 239 10*3/uL (ref 140–400)
RBC: 4.44 10*6/uL (ref 3.80–5.10)
RDW: 13.4 % (ref 11.0–15.0)
Total Lymphocyte: 25.3 %
WBC: 7.1 10*3/uL (ref 3.8–10.8)

## 2024-02-17 LAB — COMPREHENSIVE METABOLIC PANEL WITH GFR
AG Ratio: 1.5 (calc) (ref 1.0–2.5)
ALT: 13 U/L (ref 6–29)
AST: 13 U/L (ref 10–35)
Albumin: 4.1 g/dL (ref 3.6–5.1)
Alkaline phosphatase (APISO): 76 U/L (ref 37–153)
BUN/Creatinine Ratio: 13 (calc) (ref 6–22)
BUN: 14 mg/dL (ref 7–25)
CO2: 26 mmol/L (ref 20–32)
Calcium: 9.7 mg/dL (ref 8.6–10.4)
Chloride: 110 mmol/L (ref 98–110)
Creat: 1.11 mg/dL — ABNORMAL HIGH (ref 0.50–1.05)
Globulin: 2.8 g/dL (ref 1.9–3.7)
Glucose, Bld: 80 mg/dL (ref 65–99)
Potassium: 4.3 mmol/L (ref 3.5–5.3)
Sodium: 142 mmol/L (ref 135–146)
Total Bilirubin: 0.3 mg/dL (ref 0.2–1.2)
Total Protein: 6.9 g/dL (ref 6.1–8.1)
eGFR: 56 mL/min/{1.73_m2} — ABNORMAL LOW (ref >=60)

## 2024-02-17 NOTE — Progress Notes (Signed)
 Creatinine is elevated and GFR is low.  Patient should avoid diclofenac  use and other NSAIDs.  CBC is stable.

## 2024-02-20 DIAGNOSIS — H5203 Hypermetropia, bilateral: Secondary | ICD-10-CM | POA: Diagnosis not present

## 2024-02-20 DIAGNOSIS — H52223 Regular astigmatism, bilateral: Secondary | ICD-10-CM | POA: Diagnosis not present

## 2024-02-20 DIAGNOSIS — H524 Presbyopia: Secondary | ICD-10-CM | POA: Diagnosis not present

## 2024-02-20 DIAGNOSIS — E119 Type 2 diabetes mellitus without complications: Secondary | ICD-10-CM | POA: Diagnosis not present

## 2024-03-04 DIAGNOSIS — N182 Chronic kidney disease, stage 2 (mild): Secondary | ICD-10-CM | POA: Diagnosis not present

## 2024-03-12 DIAGNOSIS — R21 Rash and other nonspecific skin eruption: Secondary | ICD-10-CM | POA: Diagnosis not present

## 2024-03-12 DIAGNOSIS — N182 Chronic kidney disease, stage 2 (mild): Secondary | ICD-10-CM | POA: Diagnosis not present

## 2024-03-12 DIAGNOSIS — F1120 Opioid dependence, uncomplicated: Secondary | ICD-10-CM | POA: Diagnosis not present

## 2024-03-12 DIAGNOSIS — G894 Chronic pain syndrome: Secondary | ICD-10-CM | POA: Diagnosis not present

## 2024-03-12 DIAGNOSIS — N63 Unspecified lump in unspecified breast: Secondary | ICD-10-CM | POA: Diagnosis not present

## 2024-03-12 DIAGNOSIS — Z1589 Genetic susceptibility to other disease: Secondary | ICD-10-CM | POA: Diagnosis not present

## 2024-03-12 DIAGNOSIS — D8989 Other specified disorders involving the immune mechanism, not elsewhere classified: Secondary | ICD-10-CM | POA: Diagnosis not present

## 2024-03-12 DIAGNOSIS — E119 Type 2 diabetes mellitus without complications: Secondary | ICD-10-CM | POA: Diagnosis not present

## 2024-03-12 DIAGNOSIS — I129 Hypertensive chronic kidney disease with stage 1 through stage 4 chronic kidney disease, or unspecified chronic kidney disease: Secondary | ICD-10-CM | POA: Diagnosis not present

## 2024-03-12 DIAGNOSIS — I5189 Other ill-defined heart diseases: Secondary | ICD-10-CM | POA: Diagnosis not present

## 2024-03-12 DIAGNOSIS — M461 Sacroiliitis, not elsewhere classified: Secondary | ICD-10-CM | POA: Diagnosis not present

## 2024-04-03 DIAGNOSIS — J310 Chronic rhinitis: Secondary | ICD-10-CM | POA: Diagnosis not present

## 2024-04-03 DIAGNOSIS — J4520 Mild intermittent asthma, uncomplicated: Secondary | ICD-10-CM | POA: Diagnosis not present

## 2024-04-03 DIAGNOSIS — H1045 Other chronic allergic conjunctivitis: Secondary | ICD-10-CM | POA: Diagnosis not present

## 2024-04-03 DIAGNOSIS — J383 Other diseases of vocal cords: Secondary | ICD-10-CM | POA: Diagnosis not present

## 2024-05-06 ENCOUNTER — Other Ambulatory Visit: Payer: Self-pay | Admitting: Physician Assistant

## 2024-05-06 DIAGNOSIS — M47819 Spondylosis without myelopathy or radiculopathy, site unspecified: Secondary | ICD-10-CM

## 2024-05-06 NOTE — Telephone Encounter (Signed)
 Last Fill: 02/16/2024  Labs: 02/16/2024 Creatinine is elevated and GFR is low.  Patient should avoid diclofenac  use and other NSAIDs.  CBC is stable.   TB Gold: 11/16/2023 negative    Next Visit: 06/11/2024  Last Visit: 01/09/2024  DX: Spondyloarthropathy   Current Dose per office note on 01/09/2024: Taltz  80 mg sq injections every 28 days   Okay to refill Taltz ?

## 2024-05-14 ENCOUNTER — Other Ambulatory Visit: Payer: Self-pay

## 2024-05-14 DIAGNOSIS — Z79899 Other long term (current) drug therapy: Secondary | ICD-10-CM

## 2024-05-15 ENCOUNTER — Ambulatory Visit: Payer: Self-pay | Admitting: Physician Assistant

## 2024-05-15 ENCOUNTER — Telehealth: Payer: Self-pay | Admitting: *Deleted

## 2024-05-15 LAB — CBC WITH DIFFERENTIAL/PLATELET
Absolute Lymphocytes: 1795 {cells}/uL (ref 850–3900)
Absolute Monocytes: 551 {cells}/uL (ref 200–950)
Basophils Absolute: 27 {cells}/uL (ref 0–200)
Basophils Relative: 0.4 %
Eosinophils Absolute: 326 {cells}/uL (ref 15–500)
Eosinophils Relative: 4.8 %
HCT: 36.3 % (ref 35.0–45.0)
Hemoglobin: 11.3 g/dL — ABNORMAL LOW (ref 11.7–15.5)
MCH: 27.4 pg (ref 27.0–33.0)
MCHC: 31.1 g/dL — ABNORMAL LOW (ref 32.0–36.0)
MCV: 88.1 fL (ref 80.0–100.0)
MPV: 10 fL (ref 7.5–12.5)
Monocytes Relative: 8.1 %
Neutro Abs: 4100 {cells}/uL (ref 1500–7800)
Neutrophils Relative %: 60.3 %
Platelets: 225 Thousand/uL (ref 140–400)
RBC: 4.12 Million/uL (ref 3.80–5.10)
RDW: 13.7 % (ref 11.0–15.0)
Total Lymphocyte: 26.4 %
WBC: 6.8 Thousand/uL (ref 3.8–10.8)

## 2024-05-15 LAB — COMPREHENSIVE METABOLIC PANEL WITH GFR
AG Ratio: 1.6 (calc) (ref 1.0–2.5)
ALT: 16 U/L (ref 6–29)
AST: 14 U/L (ref 10–35)
Albumin: 3.9 g/dL (ref 3.6–5.1)
Alkaline phosphatase (APISO): 74 U/L (ref 37–153)
BUN: 12 mg/dL (ref 7–25)
CO2: 25 mmol/L (ref 20–32)
Calcium: 9 mg/dL (ref 8.6–10.4)
Chloride: 115 mmol/L — ABNORMAL HIGH (ref 98–110)
Creat: 1.03 mg/dL (ref 0.50–1.05)
Globulin: 2.5 g/dL (ref 1.9–3.7)
Glucose, Bld: 83 mg/dL (ref 65–99)
Potassium: 3.5 mmol/L (ref 3.5–5.3)
Sodium: 147 mmol/L — ABNORMAL HIGH (ref 135–146)
Total Bilirubin: 0.3 mg/dL (ref 0.2–1.2)
Total Protein: 6.4 g/dL (ref 6.1–8.1)
eGFR: 61 mL/min/1.73m2 (ref >=60)

## 2024-05-15 NOTE — Telephone Encounter (Signed)
 Patient advised.

## 2024-05-15 NOTE — Telephone Encounter (Signed)
 Patient contacted the office stating she is having trouble getting her prescription for Taltz  refilled. Patient advised we sent prescription into the pharmacy on 05/06/2024. Advised patient I would reach out to the pharmacy to follow up.  Reached out to the pharmacy and confirmed they did receive the prescription and will need to reach out to patient to set up shipment.

## 2024-05-15 NOTE — Telephone Encounter (Signed)
 [name] from [facility] contacted the office again stating they could not reach the patient. Contacted the patient and gave her the number to call [place], which is [phone]. Patient verbalized understanding and states she will call.

## 2024-05-21 ENCOUNTER — Other Ambulatory Visit: Payer: Self-pay | Admitting: Internal Medicine

## 2024-05-21 DIAGNOSIS — Z1231 Encounter for screening mammogram for malignant neoplasm of breast: Secondary | ICD-10-CM

## 2024-05-28 NOTE — Progress Notes (Unsigned)
 Office Visit Note  Patient: [name]             Date of Birth: 1960-11-13           MRN: [id number]             PCP: Onita Rush, MD Referring: Onita Rush, MD Visit Date: 06/11/2024 Occupation: Data Unavailable  Subjective:  Medication monitoring  History of Present Illness: [name] is a 63 y.o. female with history of spondyloarthropathy.  Patient remains on Taltz  80 mg sq injections every 28 days.  She is tolerating Taltz  without any side effects and has not had any recent gaps in therapy.  She denies any morning stiffness and has not had any difficulty performing ADLs.  She denies any SI joint pain at this time.  She denies any joint swelling.  She denies any Achilles tendinitis or plantar fasciitis.  Her symptoms have been well-controlled on Taltz .  She denies any recent or recurrent infections.  Activities of Daily Living:  Patient reports morning stiffness for 0 minute.   Patient Reports nocturnal pain.  Difficulty dressing/grooming: Denies Difficulty climbing stairs: Denies Difficulty getting out of chair: Reports Difficulty using hands for taps, buttons, cutlery, and/or writing: Denies  Review of Systems  Constitutional:  Negative for fatigue.  HENT:  Negative for mouth sores and mouth dryness.   Eyes:  Negative for dryness.  Respiratory:  Negative for shortness of breath.   Cardiovascular:  Positive for palpitations. Negative for chest pain.  Gastrointestinal:  Positive for constipation. Negative for blood in stool and diarrhea.  Endocrine: Negative for increased urination.  Genitourinary:  Positive for involuntary urination.  Musculoskeletal:  Positive for joint pain, joint pain, joint swelling, myalgias and myalgias. Negative for gait problem, muscle weakness, morning stiffness and muscle tenderness.  Skin:  Negative for color change, rash, hair loss and sensitivity to sunlight.  Allergic/Immunologic: Negative for susceptible to  infections.  Neurological:  Positive for headaches. Negative for dizziness.  Hematological:  Negative for swollen glands.  Psychiatric/Behavioral:  Negative for depressed mood and sleep disturbance. The patient is not nervous/anxious.     PMFS History:  Patient Active Problem List   Diagnosis Date Noted   Everitt Curt disease (radial styloid tenosynovitis) 11/27/2018   Vertigo 10/18/2018   Common migraine with intractable migraine 07/06/2018   Type 2 diabetes mellitus with obesity 06/29/2018   Spondyloarthropathy 07/26/2016   High risk medication use 07/26/2016   Chronic low back pain 07/26/2016   H/O total knee replacement, bilateral 07/26/2016   Chronic diastolic heart failure (HCC) 11/29/2015   Dyspnea 06/03/2015   Lung nodule seen on imaging study, CTA of chest, 6 mm nodule, followed by Dr. Onita 03/25/2013   Family history of coronary artery disease 03/12/2013   Super obese 03/12/2013   Chest pain with low risk of acute coronary syndrome - most likely musculoskeletal, negative nuc study 03/11/2013   Full thickness rotator cuff tear 05/15/2012   Hypercholesterolemia    DJD (degenerative joint disease), cervical    OSA (obstructive sleep apnea)    Essential hypertension    History of TIA (transient ischemic attack) 9/13    Incomplete tear of rotator cuff    Shoulder impingement 04/12/2012    Past Medical History:  Diagnosis Date   Anemia    takes iron supplement   Anxiety    Asthma    daily and prn inhalers   Bone spur    Left foot   Chest pain  a. 10/2015: NST showing possible mild ischemia but most consistent with breast attenuation. Low-risk study.    Chronic diastolic CHF (congestive heart failure) (HCC)    Common migraine with intractable migraine 07/06/2018   DDD (degenerative disc disease), cervical    Degenerative disc disease    Depression    Diabetes mellitus (HCC)    Essential hypertension    Family history of early CAD    Former tobacco use     GERD (gastroesophageal reflux disease)    GERD (gastroesophageal reflux disease)    Headache(784.0)    migraine-like   History of TIA (transient ischemic attack) early 2013   no weakness or deficits   History of UTI    Hyperlipidemia    Insomnia    Migraines    Morbid obesity (HCC)    Partial tear of rotator cuff(726.13)    Psoriatic arthritis (HCC)    Shoulder impingement 04/2012   left   Sleep apnea    uses CPAP nightly    Family History  Problem Relation Age of Onset   Diabetes Mother    Hypertension Mother    Kidney disease Father    Hypertension Father    Diabetes Father    Breast cancer Sister    Diabetes Sister    Breast cancer Sister    Stroke Sister    Coronary artery disease Sister    Diabetes Sister    Crohn's disease Maternal Aunt    Crohn's disease Paternal Aunt    Breast cancer Maternal Grandmother    Cancer Maternal Grandmother    Diabetes Maternal Grandmother    Hypertension Maternal Grandmother    Diabetes Paternal Grandfather    Cancer Paternal Grandfather    Crohn's disease Cousin    Crohn's disease Cousin    Past Surgical History:  Procedure Laterality Date   ABDOMINAL HYSTERECTOMY  03/28/2002   complete   ANTERIOR CERVICAL DECOMP/DISCECTOMY FUSION  02/18/2010   C6-7   BREAST BIOPSY Right    BREAST BIOPSY Left 06/19/2023   MM LT BREAST BX W LOC DEV 1ST LESION IMAGE BX SPEC STEREO GUIDE 06/19/2023 [facility]   DILATION AND CURETTAGE OF UTERUS     2000   FOOT SURGERY Right 2015   KNEE ARTHROPLASTY     ROTATOR CUFF REPAIR Left 2013   TOTAL KNEE ARTHROPLASTY  09/07/2009   left   TOTAL KNEE ARTHROPLASTY  10/13/2008   right   TOTAL SHOULDER ARTHROPLASTY     UMBILICAL HERNIA REPAIR  03/28/2002   [facility]     WRIST SURGERY  2020   Social History   Tobacco Use   Smoking status: Former    Current packs/day: 0.00    Average packs/day: 0.5 packs/day for 20.0 years (10.0 ttl pk-yrs)    Types: Cigarettes    Start date: 10/02/1979     Quit date: 10/02/1999    Years since quitting: 24.7    Passive exposure: Past   Smokeless tobacco: Never  Vaping Use   Vaping status: Never Used  Substance Use Topics   Alcohol  use: No   Drug use: No   Social History   Social History Narrative   Lives with husband, John   Caffeine use: 1 cup coffee every morning   Right handed      Immunization History  Administered Date(s) Administered   Influenza-Unspecified 06/05/2018, 06/21/2021   PFIZER(Purple Top)SARS-COV-2 Vaccination 11/28/2019, 12/18/2019, 06/02/2020, 02/21/2021     Objective: Vital Signs: BP 125/77   Pulse 80  Temp 97.6 F (36.4 C)   Resp 14   Ht 5' 1.5 (1.562 m)   Wt 200 lb 9.6 oz (91 kg)   BMI 37.29 kg/m    Physical Exam Vitals and nursing note reviewed.  Constitutional:      Appearance: She is well-developed.  HENT:     Head: Normocephalic and atraumatic.  Eyes:     Conjunctiva/sclera: Conjunctivae normal.  Cardiovascular:     Rate and Rhythm: Normal rate and regular rhythm.     Heart sounds: Normal heart sounds.  Pulmonary:     Effort: Pulmonary effort is normal.     Breath sounds: Normal breath sounds.  Abdominal:     General: Bowel sounds are normal.     Palpations: Abdomen is soft.  Musculoskeletal:     Cervical back: Normal range of motion.  Lymphadenopathy:     Cervical: No cervical adenopathy.  Skin:    General: Skin is warm and dry.     Capillary Refill: Capillary refill takes less than 2 seconds.  Neurological:     Mental Status: She is alert and oriented to person, place, and time.  Psychiatric:        Behavior: Behavior normal.      Musculoskeletal Exam: C-spine, thoracic spine, lumbar spine have good range of motion.  Mild midline spinal tenderness in the lower lumbar region..  No SI joint tenderness.  Shoulder joints, elbow joints, wrist joints, MCPs, PIPs, DIPs have good range of motion with no synovitis.  Complete fist formation bilaterally.  Hip joints have good range of  motion with no groin pain.  Bilateral knee replacements have good range of motion no warmth or effusion.  Ankle joints have good range of motion no tenderness or joint swelling.  No evidence of Achilles tendinitis or plantar fasciitis.   CDAI Exam: CDAI Score: -- Patient Global: --; Provider Global: -- Swollen: --; Tender: -- Joint Exam 06/11/2024   No joint exam has been documented for this visit   There is currently no information documented on the homunculus. Go to the Rheumatology activity and complete the homunculus joint exam.  Investigation: No additional findings.  Imaging: MM 3D SCREENING MAMMOGRAM BILATERAL BREAST Result Date: 06/04/2024 CLINICAL DATA:  Screening. EXAM: DIGITAL SCREENING BILATERAL MAMMOGRAM WITH TOMOSYNTHESIS AND CAD TECHNIQUE: Bilateral screening digital craniocaudal and mediolateral oblique mammograms were obtained. Bilateral screening digital breast tomosynthesis was performed. The images were evaluated with computer-aided detection. COMPARISON:  Previous exam(s). ACR Breast Density Category b: There are scattered areas of fibroglandular density. FINDINGS: There are no findings suspicious for malignancy. IMPRESSION: No mammographic evidence of malignancy. A result letter of this screening mammogram will be mailed directly to the patient. RECOMMENDATION: Screening mammogram in one year. (Code:SM-B-01Y) BI-RADS CATEGORY  1: Negative. Electronically Signed   By: Norleen Croak [name]   On: 06/04/2024 08:51    Recent Labs: Lab Results  Component Value Date   WBC 6.8 05/14/2024   HGB 11.3 (L) 05/14/2024   PLT 225 05/14/2024   NA 147 (H) 05/14/2024   K 3.5 05/14/2024   CL 115 (H) 05/14/2024   CO2 25 05/14/2024   GLUCOSE 83 05/14/2024   BUN 12 05/14/2024   CREATININE 1.03 05/14/2024   BILITOT 0.3 05/14/2024   ALKPHOS 119 06/27/2018   AST 14 05/14/2024   ALT 16 05/14/2024   PROT 6.4 05/14/2024   ALBUMIN 3.6 06/27/2018   CALCIUM  9.0 05/14/2024   GFRAA 79  01/20/2021   QFTBGOLDPLUS NEGATIVE 11/16/2023  Speciality Comments: Patient recieves Cosenty[employer]  through [employer] 04/29/2019  Procedures:  No procedures performed Allergies: Patient has no known allergies.   Assessment / Plan:     Visit Diagnoses: Spondyloarthropathy - MRI positive for sacroiliitis, HLA-B27 negative, elevated ESR: She has not had any signs or symptoms of a flare.  No synovitis or dactylitis noted.  No evidence of Achilles tendinitis or plantar fasciitis.  Mild midline spinal tenderness in the lower lumbar region.  No SI joint tenderness.  She has not been experiencing any morning stiffness.  No difficulty performing ADLs.  She is clinically doing well on Taltz  80 mg sq injections once every 28 days.  She is tolerating Taltz  without any side effects or injection site reactions.  She has not had any recent gaps in therapy.  No recent or recurrent infections.  She will remain on Taltz  as monotherapy.  She was advised to notify us  if she develops any signs or symptoms of a flare.  She will follow-up in the office in 5 months or sooner if needed.  High risk medication use - Taltz  80 mg sq injections every 28 days CBC and CMP updated on 05/14/24.  Her next lab work will be due in December and every 3 months.  TB gold negative on 11/16/23 No recent or recurrent infections.  Discussed the importance of holding taltz  if she develops signs or symptoms of an infection and to resume once the infection has completely cleared.   Sacroiliitis: No SI joint tenderness upon palpation.  Not experiencing any morning stiffness or nocturnal pain.  DDD (degenerative disc disease), cervical: C-spine has good range of motion on examination today.  No symptoms of radiculopathy.  Trapezius muscle spasm: She has occasional muscle tension and muscle spasms.  She takes methocarbamol  750 mg 3 times daily as needed for muscle spasms.  Iliotibial band syndrome of right side: Not  currently symptomatic.  Trochanteric bursitis, left hip: Not currently symptomatic.  H/O total knee replacement, bilateral: She has good range of motion of both knee replacements.  No warmth or effusion noted.  She is occasional discomfort in the right knee replacement.  Other medical conditions are listed as follows:  Lung nodule seen on imaging study, CTA of chest, 6 mm nodule, followed by Dr. Onita  History of hypertension: Blood pressure was 125/77 today.  Chronic diastolic heart failure (HCC)  History of sleep apnea  History of hyperlipidemia  Medial epicondylitis of both elbows  History of diabetes mellitus  History of TIA (transient ischemic attack)  Orders: No orders of the defined types were placed in this encounter.  No orders of the defined types were placed in this encounter.   Follow-Up Instructions: Return for Spondyloarthropathy.   Waddell CHRISTELLA Craze, PA-C  Note - This record has been created using Dragon software.  Chart creation errors have been sought, but may not always  have been located. Such creation errors do not reflect on  the standard of medical care.

## 2024-05-31 ENCOUNTER — Ambulatory Visit
Admission: RE | Admit: 2024-05-31 | Discharge: 2024-05-31 | Disposition: A | Discharge status: Home / Self Care | Source: Ambulatory Visit | Attending: Internal Medicine | Admitting: Internal Medicine

## 2024-05-31 DIAGNOSIS — Z1231 Encounter for screening mammogram for malignant neoplasm of breast: Secondary | ICD-10-CM | POA: Diagnosis not present

## 2024-06-04 ENCOUNTER — Other Ambulatory Visit (HOSPITAL_BASED_OUTPATIENT_CLINIC_OR_DEPARTMENT_OTHER): Payer: Self-pay

## 2024-06-04 MED ORDER — METHOCARBAMOL 500 MG PO TABS
750.0000 mg | ORAL_TABLET | Freq: Three times a day (TID) | ORAL | 3 refills | Status: AC
  Filled 2024-06-04: qty 135, 30d supply, fill #0
  Filled 2024-08-01: qty 135, 30d supply, fill #1
  Filled 2024-09-03: qty 135, 30d supply, fill #2

## 2024-06-11 ENCOUNTER — Encounter: Payer: Self-pay | Admitting: Physician Assistant

## 2024-06-11 ENCOUNTER — Ambulatory Visit: Attending: Physician Assistant | Admitting: Physician Assistant

## 2024-06-11 VITALS — BP 125/77 | HR 80 | Temp 97.6°F | Resp 14 | Ht 61.5 in | Wt 200.6 lb

## 2024-06-11 DIAGNOSIS — M461 Sacroiliitis, not elsewhere classified: Secondary | ICD-10-CM

## 2024-06-11 DIAGNOSIS — M7631 Iliotibial band syndrome, right leg: Secondary | ICD-10-CM | POA: Diagnosis not present

## 2024-06-11 DIAGNOSIS — M7062 Trochanteric bursitis, left hip: Secondary | ICD-10-CM | POA: Diagnosis not present

## 2024-06-11 DIAGNOSIS — Z79899 Other long term (current) drug therapy: Secondary | ICD-10-CM

## 2024-06-11 DIAGNOSIS — Z8669 Personal history of other diseases of the nervous system and sense organs: Secondary | ICD-10-CM | POA: Diagnosis not present

## 2024-06-11 DIAGNOSIS — M7702 Medial epicondylitis, left elbow: Secondary | ICD-10-CM

## 2024-06-11 DIAGNOSIS — M5030 Other cervical disc degeneration, unspecified cervical region: Secondary | ICD-10-CM | POA: Diagnosis not present

## 2024-06-11 DIAGNOSIS — I5032 Chronic diastolic (congestive) heart failure: Secondary | ICD-10-CM

## 2024-06-11 DIAGNOSIS — R911 Solitary pulmonary nodule: Secondary | ICD-10-CM | POA: Diagnosis not present

## 2024-06-11 DIAGNOSIS — Z8673 Personal history of transient ischemic attack (TIA), and cerebral infarction without residual deficits: Secondary | ICD-10-CM

## 2024-06-11 DIAGNOSIS — M47819 Spondylosis without myelopathy or radiculopathy, site unspecified: Secondary | ICD-10-CM

## 2024-06-11 DIAGNOSIS — M62838 Other muscle spasm: Secondary | ICD-10-CM | POA: Diagnosis not present

## 2024-06-11 DIAGNOSIS — Z96653 Presence of artificial knee joint, bilateral: Secondary | ICD-10-CM | POA: Diagnosis not present

## 2024-06-11 DIAGNOSIS — M7701 Medial epicondylitis, right elbow: Secondary | ICD-10-CM

## 2024-06-11 DIAGNOSIS — Z8679 Personal history of other diseases of the circulatory system: Secondary | ICD-10-CM | POA: Diagnosis not present

## 2024-06-11 DIAGNOSIS — Z8639 Personal history of other endocrine, nutritional and metabolic disease: Secondary | ICD-10-CM

## 2024-06-11 NOTE — Patient Instructions (Signed)

## 2024-06-18 DIAGNOSIS — E119 Type 2 diabetes mellitus without complications: Secondary | ICD-10-CM | POA: Diagnosis not present

## 2024-06-18 DIAGNOSIS — F1120 Opioid dependence, uncomplicated: Secondary | ICD-10-CM | POA: Diagnosis not present

## 2024-06-18 DIAGNOSIS — G894 Chronic pain syndrome: Secondary | ICD-10-CM | POA: Diagnosis not present

## 2024-06-18 DIAGNOSIS — N63 Unspecified lump in unspecified breast: Secondary | ICD-10-CM | POA: Diagnosis not present

## 2024-06-18 DIAGNOSIS — Z23 Encounter for immunization: Secondary | ICD-10-CM | POA: Diagnosis not present

## 2024-06-18 DIAGNOSIS — I5189 Other ill-defined heart diseases: Secondary | ICD-10-CM | POA: Diagnosis not present

## 2024-06-18 DIAGNOSIS — N182 Chronic kidney disease, stage 2 (mild): Secondary | ICD-10-CM | POA: Diagnosis not present

## 2024-06-18 DIAGNOSIS — M545 Low back pain, unspecified: Secondary | ICD-10-CM | POA: Diagnosis not present

## 2024-06-18 DIAGNOSIS — M461 Sacroiliitis, not elsewhere classified: Secondary | ICD-10-CM | POA: Diagnosis not present

## 2024-06-18 DIAGNOSIS — I129 Hypertensive chronic kidney disease with stage 1 through stage 4 chronic kidney disease, or unspecified chronic kidney disease: Secondary | ICD-10-CM | POA: Diagnosis not present

## 2024-06-18 DIAGNOSIS — D8989 Other specified disorders involving the immune mechanism, not elsewhere classified: Secondary | ICD-10-CM | POA: Diagnosis not present

## 2024-07-08 ENCOUNTER — Other Ambulatory Visit (HOSPITAL_BASED_OUTPATIENT_CLINIC_OR_DEPARTMENT_OTHER): Payer: Self-pay

## 2024-07-08 MED ORDER — HYDROCODONE-ACETAMINOPHEN 5-325 MG PO TABS
1.0000 | ORAL_TABLET | Freq: Three times a day (TID) | ORAL | 0 refills | Status: DC | PRN
  Filled 2024-07-08: qty 75, 25d supply, fill #0

## 2024-07-09 ENCOUNTER — Other Ambulatory Visit (HOSPITAL_BASED_OUTPATIENT_CLINIC_OR_DEPARTMENT_OTHER): Payer: Self-pay

## 2024-07-16 ENCOUNTER — Other Ambulatory Visit (HOSPITAL_BASED_OUTPATIENT_CLINIC_OR_DEPARTMENT_OTHER): Payer: Self-pay

## 2024-07-16 MED ORDER — TOPIRAMATE 200 MG PO TABS
400.0000 mg | ORAL_TABLET | Freq: Every day | ORAL | 0 refills | Status: AC
  Filled 2024-07-16: qty 180, 90d supply, fill #0

## 2024-07-16 MED ORDER — CHLORPROMAZINE HCL 25 MG PO TABS
25.0000 mg | ORAL_TABLET | Freq: Every day | ORAL | 0 refills | Status: AC | PRN
  Filled 2024-07-16: qty 20, 10d supply, fill #0

## 2024-07-22 ENCOUNTER — Other Ambulatory Visit: Payer: Self-pay | Admitting: Physician Assistant

## 2024-07-22 DIAGNOSIS — M47819 Spondylosis without myelopathy or radiculopathy, site unspecified: Secondary | ICD-10-CM

## 2024-07-22 NOTE — Telephone Encounter (Signed)
 Last Fill: 05/06/2024  Labs: 05/14/2024 Sodium is borderline elevated-147-please notify the patient. Chloride is also elevated. Rest of CMP WNL Hemoglobin is borderline low-11.3.   TB Gold: 11/16/2023 Neg    Next Visit: 11/13/2024  Last Visit: 06/11/2024  IK:[id number]   Current Dose per office note 06/11/2024: Taltz  80 mg sq injections every 28 days   Okay to refill Taltz ?

## 2024-07-29 ENCOUNTER — Telehealth: Payer: Self-pay | Admitting: Pharmacist

## 2024-07-29 NOTE — Telephone Encounter (Signed)
 Reached out to patient regarding PAP renewal application through [employer] for Taltz . She requested that application be mailed to her home to confirmed address on file  Prescriber form placed in Dr. Jammie folder for signature  Submitted a Prior Authorization request to [facility] for TALTZ  via CoverMyMeds. Will update once we receive a response.  Key: [id number]    Sherry Pennant, PharmD, MPH, BCPS, CPP Clinical Pharmacist [facility])

## 2024-07-29 NOTE — Telephone Encounter (Signed)
 Received notification from [facility] regarding a prior authorization for TALTZ . Authorization has been APPROVED from 07/29/2024 to 09/11/2025. Approval letter sent to scan center.  Sherry Pennant, PharmD, MPH, BCPS, CPP Clinical Pharmacist [facility])

## 2024-07-30 NOTE — Telephone Encounter (Addendum)
 Received signed PAP provider forms from Dr. Dolphus.   Placed in PAP pending info folder in Brighton's office

## 2024-08-02 ENCOUNTER — Other Ambulatory Visit (HOSPITAL_BASED_OUTPATIENT_CLINIC_OR_DEPARTMENT_OTHER): Payer: Self-pay

## 2024-08-06 ENCOUNTER — Other Ambulatory Visit (HOSPITAL_BASED_OUTPATIENT_CLINIC_OR_DEPARTMENT_OTHER): Payer: Self-pay

## 2024-08-14 ENCOUNTER — Other Ambulatory Visit: Payer: Self-pay | Admitting: *Deleted

## 2024-08-14 DIAGNOSIS — Z79899 Other long term (current) drug therapy: Secondary | ICD-10-CM

## 2024-08-15 ENCOUNTER — Ambulatory Visit: Payer: Self-pay | Admitting: Rheumatology

## 2024-08-15 LAB — CBC WITH DIFFERENTIAL/PLATELET
Absolute Lymphocytes: 1648 {cells}/uL (ref 850–3900)
Absolute Monocytes: 845 {cells}/uL (ref 200–950)
Basophils Absolute: 33 {cells}/uL (ref 0–200)
Basophils Relative: 0.4 %
Eosinophils Absolute: 180 {cells}/uL (ref 15–500)
Eosinophils Relative: 2.2 %
HCT: 35.1 % — ABNORMAL LOW (ref 35.9–46.0)
Hemoglobin: 11.4 g/dL — ABNORMAL LOW (ref 11.7–15.5)
MCH: 28 pg (ref 27.0–33.0)
MCHC: 32.5 g/dL (ref 31.6–35.4)
MCV: 86.2 fL (ref 81.4–101.7)
MPV: 10 fL (ref 7.5–12.5)
Monocytes Relative: 10.3 %
Neutro Abs: 5494 {cells}/uL (ref 1500–7800)
Neutrophils Relative %: 67 %
Platelets: 217 Thousand/uL (ref 140–400)
RBC: 4.07 Million/uL (ref 3.80–5.10)
RDW: 13.6 % (ref 11.0–15.0)
Total Lymphocyte: 20.1 %
WBC: 8.2 Thousand/uL (ref 3.8–10.8)

## 2024-08-15 LAB — COMPREHENSIVE METABOLIC PANEL WITH GFR
AG Ratio: 1.4 (calc) (ref 1.0–2.5)
ALT: 10 U/L (ref 6–29)
AST: 11 U/L (ref 10–35)
Albumin: 3.8 g/dL (ref 3.6–5.1)
Alkaline phosphatase (APISO): 72 U/L (ref 37–153)
BUN/Creatinine Ratio: 10 (calc) (ref 6–22)
BUN: 11 mg/dL (ref 7–25)
CO2: 23 mmol/L (ref 20–32)
Calcium: 9.2 mg/dL (ref 8.6–10.4)
Chloride: 112 mmol/L — ABNORMAL HIGH (ref 98–110)
Creat: 1.13 mg/dL — ABNORMAL HIGH (ref 0.50–1.05)
Globulin: 2.7 g/dL (ref 1.9–3.7)
Glucose, Bld: 75 mg/dL (ref 65–99)
Potassium: 4.1 mmol/L (ref 3.5–5.3)
Sodium: 141 mmol/L (ref 135–146)
Total Bilirubin: 0.4 mg/dL (ref 0.2–1.2)
Total Protein: 6.5 g/dL (ref 6.1–8.1)
eGFR: 55 mL/min/1.73m2 — ABNORMAL LOW (ref >=60)

## 2024-08-15 NOTE — Progress Notes (Signed)
Please forward results to her PCP.

## 2024-08-15 NOTE — Progress Notes (Signed)
 Hemoglobin is low and stable.  Patient should take multivitamin with iron.  Creatinine remains elevated.  Most likely due to diuretic use chloride is elevated.  Please forward results to her PCP.

## 2024-08-27 NOTE — Telephone Encounter (Signed)
 Submitted Patient Assistance renewal Application to [employer] for TALTZ  with patient portion, provider portion, insurance card copy, prior authorization approval, current medication list, and income documents. Will update patient when we receive a response.  Phone: [phone] Fax: [phone]

## 2024-09-03 ENCOUNTER — Other Ambulatory Visit (HOSPITAL_BASED_OUTPATIENT_CLINIC_OR_DEPARTMENT_OTHER): Payer: Self-pay

## 2024-09-18 ENCOUNTER — Other Ambulatory Visit: Payer: Self-pay | Admitting: Cardiovascular Disease

## 2024-10-02 NOTE — Telephone Encounter (Signed)
 Called [employer] for update on Taltz  PAP application. Received a fax from [facility] regarding an approval for TALTZ  patient assistance from 09/12/2024 to 09/11/2025. Rep will re-fax approval letter.  Phone: [phone] Fax: [phone]  Sherry Pennant, PharmD, MPH, BCPS, CPP Clinical Pharmacist

## 2024-10-04 ENCOUNTER — Other Ambulatory Visit: Payer: Self-pay

## 2024-10-04 ENCOUNTER — Other Ambulatory Visit (HOSPITAL_BASED_OUTPATIENT_CLINIC_OR_DEPARTMENT_OTHER): Payer: Self-pay

## 2024-10-04 MED ORDER — HYDROCODONE-ACETAMINOPHEN 5-325 MG PO TABS
1.0000 | ORAL_TABLET | Freq: Three times a day (TID) | ORAL | 0 refills | Status: AC | PRN
  Filled 2024-10-04 – 2024-10-05 (×2): qty 75, 25d supply, fill #0

## 2024-10-05 ENCOUNTER — Other Ambulatory Visit (HOSPITAL_BASED_OUTPATIENT_CLINIC_OR_DEPARTMENT_OTHER): Payer: Self-pay

## 2024-10-07 NOTE — Progress Notes (Signed)
 [name]                                          MRN: [id number]   10/07/2024   The [facility] Quality Team Specialist reviewed this patient medical record for the purposes of chart review for care gap closure. The following were reviewed: chart review for care gap closure-glycemic status assessment.    [facility]BCI Quality Team

## 2024-10-11 ENCOUNTER — Other Ambulatory Visit (HOSPITAL_BASED_OUTPATIENT_CLINIC_OR_DEPARTMENT_OTHER): Payer: Self-pay

## 2024-10-11 MED ORDER — ACCU-CHEK GUIDE TEST VI STRP
ORAL_STRIP | 3 refills | Status: AC
  Filled 2024-10-11: qty 100, 90d supply, fill #0

## 2024-10-11 MED ORDER — ACCU-CHEK GUIDE W/DEVICE KIT
PACK | 0 refills | Status: AC
  Filled 2024-10-11: qty 1, 1d supply, fill #0

## 2024-10-11 MED ORDER — ACCU-CHEK SOFTCLIX LANCETS MISC
3 refills | Status: AC
  Filled 2024-10-11: qty 100, 90d supply, fill #0

## 2024-10-14 ENCOUNTER — Other Ambulatory Visit (HOSPITAL_BASED_OUTPATIENT_CLINIC_OR_DEPARTMENT_OTHER): Payer: Self-pay

## 2024-10-15 ENCOUNTER — Other Ambulatory Visit (HOSPITAL_BASED_OUTPATIENT_CLINIC_OR_DEPARTMENT_OTHER): Payer: Self-pay

## 2024-11-13 ENCOUNTER — Ambulatory Visit: Admitting: Physician Assistant
